# Patient Record
Sex: Male | Born: 1940 | Hispanic: No | Marital: Single | Smoking: Never smoker
Health system: Southern US, Community
[De-identification: ages and names within clinical notes are randomized; demographics above are authoritative.]

## PROBLEM LIST (undated history)

## (undated) DIAGNOSIS — D649 Anemia, unspecified: Secondary | ICD-10-CM

## (undated) DIAGNOSIS — F419 Anxiety disorder, unspecified: Secondary | ICD-10-CM

## (undated) DIAGNOSIS — E785 Hyperlipidemia, unspecified: Secondary | ICD-10-CM

## (undated) DIAGNOSIS — I639 Cerebral infarction, unspecified: Secondary | ICD-10-CM

## (undated) DIAGNOSIS — F0390 Unspecified dementia without behavioral disturbance: Secondary | ICD-10-CM

## (undated) DIAGNOSIS — M6281 Muscle weakness (generalized): Secondary | ICD-10-CM

## (undated) DIAGNOSIS — I10 Essential (primary) hypertension: Secondary | ICD-10-CM

## (undated) DIAGNOSIS — R1310 Dysphagia, unspecified: Secondary | ICD-10-CM

---

## 2001-06-16 ENCOUNTER — Encounter: Payer: Self-pay | Admitting: General Practice

## 2001-06-16 ENCOUNTER — Encounter: Admission: RE | Admit: 2001-06-16 | Discharge: 2001-06-16 | Payer: Self-pay | Admitting: General Practice

## 2010-12-12 ENCOUNTER — Other Ambulatory Visit (HOSPITAL_COMMUNITY): Payer: Self-pay | Admitting: Neurology

## 2010-12-12 DIAGNOSIS — M79609 Pain in unspecified limb: Secondary | ICD-10-CM

## 2010-12-12 DIAGNOSIS — I6350 Cerebral infarction due to unspecified occlusion or stenosis of unspecified cerebral artery: Secondary | ICD-10-CM

## 2010-12-18 ENCOUNTER — Inpatient Hospital Stay (HOSPITAL_COMMUNITY): Admission: RE | Admit: 2010-12-18 | Payer: Self-pay | Source: Ambulatory Visit

## 2010-12-18 ENCOUNTER — Other Ambulatory Visit (HOSPITAL_COMMUNITY): Payer: Self-pay

## 2012-02-01 ENCOUNTER — Other Ambulatory Visit: Payer: Self-pay | Admitting: Internal Medicine

## 2012-02-01 DIAGNOSIS — R200 Anesthesia of skin: Secondary | ICD-10-CM

## 2012-02-03 ENCOUNTER — Ambulatory Visit
Admission: RE | Admit: 2012-02-03 | Discharge: 2012-02-03 | Disposition: A | Discharge status: Home / Self Care | Payer: Medicaid Other | Source: Ambulatory Visit | Attending: Internal Medicine | Admitting: Internal Medicine

## 2012-02-03 DIAGNOSIS — R200 Anesthesia of skin: Secondary | ICD-10-CM

## 2015-11-28 ENCOUNTER — Emergency Department (HOSPITAL_COMMUNITY): Payer: Medicare Other

## 2015-11-28 ENCOUNTER — Encounter (HOSPITAL_COMMUNITY): Payer: Self-pay | Admitting: Emergency Medicine

## 2015-11-28 ENCOUNTER — Inpatient Hospital Stay (HOSPITAL_COMMUNITY)
Admission: EM | Admit: 2015-11-28 | Discharge: 2015-11-29 | DRG: 195 | Disposition: A | Discharge status: Home / Self Care | Payer: Medicare Other | Attending: Infectious Disease | Admitting: Infectious Disease

## 2015-11-28 DIAGNOSIS — R4182 Altered mental status, unspecified: Secondary | ICD-10-CM

## 2015-11-28 DIAGNOSIS — J101 Influenza due to other identified influenza virus with other respiratory manifestations: Secondary | ICD-10-CM | POA: Diagnosis present

## 2015-11-28 DIAGNOSIS — I10 Essential (primary) hypertension: Secondary | ICD-10-CM | POA: Insufficient documentation

## 2015-11-28 DIAGNOSIS — Z23 Encounter for immunization: Secondary | ICD-10-CM | POA: Diagnosis not present

## 2015-11-28 DIAGNOSIS — J1189 Influenza due to unidentified influenza virus with other manifestations: Secondary | ICD-10-CM

## 2015-11-28 DIAGNOSIS — Z7982 Long term (current) use of aspirin: Secondary | ICD-10-CM | POA: Diagnosis not present

## 2015-11-28 DIAGNOSIS — Z8673 Personal history of transient ischemic attack (TIA), and cerebral infarction without residual deficits: Secondary | ICD-10-CM | POA: Insufficient documentation

## 2015-11-28 DIAGNOSIS — Z603 Acculturation difficulty: Secondary | ICD-10-CM | POA: Insufficient documentation

## 2015-11-28 DIAGNOSIS — R55 Syncope and collapse: Secondary | ICD-10-CM | POA: Diagnosis not present

## 2015-11-28 DIAGNOSIS — E860 Dehydration: Secondary | ICD-10-CM | POA: Diagnosis present

## 2015-11-28 DIAGNOSIS — Z789 Other specified health status: Secondary | ICD-10-CM | POA: Diagnosis not present

## 2015-11-28 HISTORY — DX: Cerebral infarction, unspecified: I63.9

## 2015-11-28 LAB — CBC WITH DIFFERENTIAL/PLATELET
Basophils Absolute: 0 10*3/uL (ref 0.0–0.1)
Basophils Relative: 0 %
Eosinophils Absolute: 0.1 10*3/uL (ref 0.0–0.7)
Eosinophils Relative: 1 %
HCT: 39.6 % (ref 39.0–52.0)
Hemoglobin: 13.5 g/dL (ref 13.0–17.0)
Lymphocytes Relative: 12 %
Lymphs Abs: 0.9 10*3/uL (ref 0.7–4.0)
MCH: 25.1 pg — ABNORMAL LOW (ref 26.0–34.0)
MCHC: 34.1 g/dL (ref 30.0–36.0)
MCV: 73.7 fL — ABNORMAL LOW (ref 78.0–100.0)
Monocytes Absolute: 0.9 10*3/uL (ref 0.1–1.0)
Monocytes Relative: 12 %
Neutro Abs: 5.7 10*3/uL (ref 1.7–7.7)
Neutrophils Relative %: 75 %
Platelets: 177 10*3/uL (ref 150–400)
RBC: 5.37 MIL/uL (ref 4.22–5.81)
RDW: 15.5 % (ref 11.5–15.5)
WBC: 7.6 10*3/uL (ref 4.0–10.5)

## 2015-11-28 LAB — CSF CELL COUNT WITH DIFFERENTIAL
RBC Count, CSF: 1 /mm3 — ABNORMAL HIGH
RBC Count, CSF: 33 /mm3 — ABNORMAL HIGH
Tube #: 1
Tube #: 4
WBC, CSF: 1 /mm3 (ref 0–5)
WBC, CSF: 3 /mm3 (ref 0–5)

## 2015-11-28 LAB — URINE MICROSCOPIC-ADD ON
Bacteria, UA: NONE SEEN
RBC / HPF: NONE SEEN RBC/hpf (ref 0–5)
Squamous Epithelial / LPF: NONE SEEN

## 2015-11-28 LAB — COMPREHENSIVE METABOLIC PANEL
ALT: 19 U/L (ref 17–63)
AST: 25 U/L (ref 15–41)
Albumin: 3.7 g/dL (ref 3.5–5.0)
Alkaline Phosphatase: 86 U/L (ref 38–126)
Anion gap: 10 (ref 5–15)
BUN: 18 mg/dL (ref 6–20)
CO2: 23 mmol/L (ref 22–32)
Calcium: 9 mg/dL (ref 8.9–10.3)
Chloride: 105 mmol/L (ref 101–111)
Creatinine, Ser: 1.43 mg/dL — ABNORMAL HIGH (ref 0.61–1.24)
GFR calc Af Amer: 55 mL/min — ABNORMAL LOW (ref >60)
GFR calc non Af Amer: 47 mL/min — ABNORMAL LOW (ref >60)
Glucose, Bld: 124 mg/dL — ABNORMAL HIGH (ref 65–99)
Potassium: 3.4 mmol/L — ABNORMAL LOW (ref 3.5–5.1)
Sodium: 138 mmol/L (ref 135–145)
Total Bilirubin: 0.6 mg/dL (ref 0.3–1.2)
Total Protein: 7.6 g/dL (ref 6.5–8.1)

## 2015-11-28 LAB — I-STAT TROPONIN, ED: Troponin i, poc: 0.01 ng/mL (ref 0.00–0.08)

## 2015-11-28 LAB — INFLUENZA PANEL BY PCR (TYPE A & B)
H1N1 flu by pcr: NOT DETECTED
Influenza A By PCR: NEGATIVE
Influenza B By PCR: POSITIVE — AB

## 2015-11-28 LAB — URINALYSIS, ROUTINE W REFLEX MICROSCOPIC
Bilirubin Urine: NEGATIVE
Glucose, UA: NEGATIVE mg/dL
Ketones, ur: 15 mg/dL — AB
Leukocytes, UA: NEGATIVE
Nitrite: NEGATIVE
Protein, ur: 100 mg/dL — AB
Specific Gravity, Urine: 1.012 (ref 1.005–1.030)
pH: 6 (ref 5.0–8.0)

## 2015-11-28 LAB — PROTEIN, CSF: Total  Protein, CSF: 34 mg/dL (ref 15–45)

## 2015-11-28 LAB — I-STAT CG4 LACTIC ACID, ED
Lactic Acid, Venous: 2.06 mmol/L (ref 0.5–2.0)
Lactic Acid, Venous: 1.02 mmol/L (ref 0.5–2.0)

## 2015-11-28 LAB — GLUCOSE, CSF: Glucose, CSF: 82 mg/dL — ABNORMAL HIGH (ref 40–70)

## 2015-11-28 LAB — TROPONIN I: Troponin I: <0.03 ng/mL (ref <0.031)

## 2015-11-28 MED ORDER — CEFTRIAXONE SODIUM 2 G IJ SOLR
2.0000 g | Freq: Once | INTRAMUSCULAR | Status: AC
  Administered 2015-11-28: 2 g via INTRAVENOUS
  Filled 2015-11-28: qty 2

## 2015-11-28 MED ORDER — ACETAMINOPHEN 500 MG PO TABS
1000.0000 mg | ORAL_TABLET | Freq: Once | ORAL | Status: AC
  Administered 2015-11-28: 1000 mg via ORAL
  Filled 2015-11-28: qty 2

## 2015-11-28 MED ORDER — SODIUM CHLORIDE 0.9 % IV BOLUS (SEPSIS)
1000.0000 mL | Freq: Once | INTRAVENOUS | Status: AC
  Administered 2015-11-28: 1000 mL via INTRAVENOUS

## 2015-11-28 MED ORDER — OSELTAMIVIR PHOSPHATE 75 MG PO CAPS
75.0000 mg | ORAL_CAPSULE | Freq: Two times a day (BID) | ORAL | Status: DC
  Administered 2015-11-28: 75 mg via ORAL
  Filled 2015-11-28 (×2): qty 1

## 2015-11-28 MED ORDER — POTASSIUM CHLORIDE CRYS ER 20 MEQ PO TBCR
20.0000 meq | EXTENDED_RELEASE_TABLET | Freq: Once | ORAL | Status: AC
  Administered 2015-11-28: 20 meq via ORAL
  Filled 2015-11-28: qty 1

## 2015-11-28 MED ORDER — DEXAMETHASONE SODIUM PHOSPHATE 10 MG/ML IJ SOLN
10.0000 mg | Freq: Once | INTRAMUSCULAR | Status: AC
  Administered 2015-11-28: 10 mg via INTRAVENOUS
  Filled 2015-11-28: qty 1

## 2015-11-28 MED ORDER — HEPARIN SODIUM (PORCINE) 5000 UNIT/ML IJ SOLN
5000.0000 [IU] | Freq: Three times a day (TID) | INTRAMUSCULAR | Status: DC
  Administered 2015-11-28 – 2015-11-29 (×3): 5000 [IU] via SUBCUTANEOUS
  Filled 2015-11-28 (×4): qty 1

## 2015-11-28 MED ORDER — SODIUM CHLORIDE 0.9 % IV SOLN
INTRAVENOUS | Status: DC
Start: 2015-11-28 — End: 2015-11-29
  Administered 2015-11-28: 22:00:00 via INTRAVENOUS

## 2015-11-28 MED ORDER — VANCOMYCIN HCL 10 G IV SOLR
1500.0000 mg | Freq: Once | INTRAVENOUS | Status: AC
  Administered 2015-11-28: 1500 mg via INTRAVENOUS
  Filled 2015-11-28: qty 1500

## 2015-11-28 NOTE — ED Notes (Signed)
Interpreter at bedside-- Dr. Otis BraceIreck speaking with pt through interpreter.   Pt states that he felt bad yesterday with a fever, headache and vomiting. Went to work today, felt bad while trying to help a friend and passed out. States headache in entire head, has a hx of headaches.   Coworker is at bedside also-- witnessed pt pass out x 2, c/o severe headache to coworker.

## 2015-11-28 NOTE — H&P (Signed)
Date: 11/28/2015               Patient Name:  Jorge Burns MRN: [id number]  DOB: 06/09/1941 Age / Sex: 75 y.o., male   PCP: No primary care provider on file.         Medical Service: Internal Medicine Teaching Service         Attending Physici2an: Dr. Randall Hissornelius N Van Dam, MD    First Contact: Dr. Reubin MilanBilly Jabar Krysiak Pager: [phone]  Second Contact: Dr. Gara Kroneriana Truong Pager: [phone]       After Hours (After 5p/  First Contact Pager: [phone]  weekends / holidays): Second Contact Pager: [phone]   Chief Complaint: Fever, headache, syncope  History of Present Illness: Jorge Burns is a Jarai-speaking 75yo with PMH R MCA CVA 2 years ago who presents after passing out at work earlier today. Of note, although an interpreter was apparently available in the ED, no interpreters are available currently and will not be for at least another 1-2 days so the history is obtained through chart review. Over the past 2-3 days, he has experienced subjective fevers and generalized malaise as well as headaches and poor appetite. This morning, at work, he went up to urinate at work and passed out, corroborated by his co-worker who witnessed no seizure-like activity. He also had an episode of NBNB vomiting this morning. He was confused after passing out, but this has improved. He denies sick contacts, recent illness, photophobia, vision changes, chest pain, palpitations, abdominal pain, diarrhea, constipation, rashes, focal weakness/numbness, or other symptoms at this time.  Meds: Current Facility-Administered Medications  Medication Dose Route Frequency Provider Last Rate Last Dose  . heparin injection 5,000 Units  5,000 Units Subcutaneous 3 times per day Denton Brickiana M Truong, MD   5,000 Units at 11/28/15 1747   No current outpatient prescriptions on file.    Allergies: Allergies as of 11/28/2015  . (No Known Allergies)   Past Medical History  Diagnosis Date  . Stroke [facility](HCC)     "a long time ago -- 2 years ago"   History  reviewed. No pertinent past surgical history. History reviewed. No pertinent family history. Social History   Social History  . Marital Status: Single    Spouse Name: N/A  . Number of Children: N/A  . Years of Education: N/A   Occupational History  . Not on file.   Social History Main Topics  . Smoking status: Never Smoker   . Smokeless tobacco: Not on file  . Alcohol Use: No  . Drug Use: No  . Sexual Activity: Not on file   Other Topics Concern  . Not on file   Social History Narrative  . No narrative on file    Review of Systems: Pertinent items noted in HPI and remainder of comprehensive ROS otherwise negative.  Physical Exam: Blood pressure 139/75, pulse 78, temperature 100.1 F (37.8 C), temperature source Oral, resp. rate 14, SpO2 94 %.   Gen: Well-appearing, alert and oriented to person, place, and time HEENT: Oropharynx clear without erythema or exudate.  Neck: No cervical LAD, no thyromegaly or nodules, no JVD noted. Does have some neck stiffness. CV: Normal rate, regular rhythm, no murmurs, rubs, or gallops Pulmonary: Normal effort, CTA bilaterally, no crackles or wheezes Abdominal: Soft, non-tender, non-distended, without rebound, guarding, or masses Extremities: Distal pulses 2+ in upper and lower extremities bilaterally, no tenderness, erythema or edema Neuro: CN II-XII grossly intact, no focal weakness or sensory deficits noted. Kernig's and  Brudzinski's negative. Positive nuchal rigidity. Skin: No atypical appearing moles. No rashes  Lab results: Basic Metabolic Panel:  Recent Labs  91/47/82 1240  NA 138  K 3.4*  CL 105  CO2 23  GLUCOSE 124*  BUN 18  CREATININE 1.43*  CALCIUM 9.0   Liver Function Tests:  Recent Labs  11/28/15 1240  AST 25  ALT 19  ALKPHOS 86  BILITOT 0.6  PROT 7.6  ALBUMIN 3.7   CBC:  Recent Labs  11/28/15 1120  WBC 7.6  NEUTROABS 5.7  HGB 13.5  HCT 39.6  MCV 73.7*  PLT 177   Urinalysis:  Recent  Labs  11/28/15 1535  COLORURINE YELLOW  LABSPEC 1.012  PHURINE 6.0  GLUCOSEU NEGATIVE  HGBUR SMALL*  BILIRUBINUR NEGATIVE  KETONESUR 15*  PROTEINUR 100*  NITRITE NEGATIVE  LEUKOCYTESUR NEGATIVE   Imaging results:  Dg Chest 2 View  11/28/2015  CLINICAL DATA:  Cough and fever with recent syncopal event EXAM: CHEST  2 VIEW COMPARISON:  None. FINDINGS: Cardiac shadow is at the upper limits of normal in size. The lungs are well aerated bilaterally. Some linear changes are noted in the left lung likely chronic in nature and accentuated by patient rotation. On the lateral projection there is a rounded density identified just above the hemidiaphragm which is not well appreciated on the frontal exam. This may be related to a prominent vessel on end. Calcifications are noted to the left of the midline at the thoracic inlet likely related to a calcified thyroid nodule. No bony abnormality is seen. IMPRESSION: Rounded density seen only on the lateral projection which likely represents a vessel on end. Alternatively this may represent a calcified lymph node. Nonemergent CT of the chest may be helpful for further evaluation. Linear density throughout the left lung likely related to scarring. No focal confluent infiltrate is seen. Calcification to the left of the midline as described likely representing a thyroid nodule. Electronically Signed   By: Alcide Clever M.D.   On: 11/28/2015 12:03   Ct Head Wo Contrast  11/28/2015  CLINICAL DATA:  Syncope.  Altered mental status. EXAM: CT HEAD WITHOUT CONTRAST TECHNIQUE: Contiguous axial images were obtained from the base of the skull through the vertex without intravenous contrast. COMPARISON:  02/03/2012 FINDINGS: 2.4 by 2.3 by 3.0 cm region of encephalomalacia in the right posterior temporal lobe. This is sharply defined and appears likely to be chronic, correlate with history of prior stroke. Cerebellum, brainstem, cerebral peduncles, thalami, and basal ganglia  unremarkable aside from a 3 mm hypodensity in the right globus pallidus nucleus which could be a dilated perivascular space or small remote lacunar infarct. Ventricular system and basilar cisterns unremarkable. No intracranial hemorrhage, mass lesion, or acute CVA. Chronic bilateral maxillary and ethmoid sinusitis noted with mild chronic right sphenoid sinusitis. There is atherosclerotic calcification of the cavernous carotid arteries bilaterally. IMPRESSION: 1. No acute intracranial findings. 2. 3 cm region of encephalomalacia in the right posterior temporal lobe favoring old stroke. 3. Suspected tiny remote lacunar infarct in the right globus pallidus nucleus. 4. Chronic paranasal sinusitis. Electronically Signed   By: Gaylyn Rong M.D.   On: 11/28/2015 12:10   Dg Lumbar Puncture Fluoro Guide  11/28/2015  CLINICAL DATA:  Clinical suspicion for meningitis. Cough, fever, headache, and vomiting. Altered mental status. EXAM: DIAGNOSTIC LUMBAR PUNCTURE UNDER FLUOROSCOPIC GUIDANCE FLUOROSCOPY TIME:  Radiation Exposure Index (as provided by the fluoroscopic device): 26 microGy*m^2 PROCEDURE: The risks, benefits, and alternatives to fluoroscopically guided lumbar  puncture were discussed with the patient by the emergency room physician for whom I am the designated associate. Informed consent was obtained. There was a language barrier and reportedly telephone interpreter was utilized. The patient understood and elected to undergo the procedure. Standard time-out was employed. Following sterile skin prep and local anesthetic administration consisting of 1 percent lidocaine, a 22 gauge spinal needle was advanced without difficulty into the thecal sac at the at the L4-5 level. Clear CSF was returned. Opening pressure was not obtained due to the language barrier -I was not certain the patient could turn in the appropriate fashion within meal in his back. 12 cc of clear CSF was collected. The needle was subsequently  removed and the skin cleansed and bandaged. No immediate complications were observed. IMPRESSION: 1. Technically successful lumbar puncture at the L4-5 level, yielding 12 cc of clear CSF which was sent to the lab for analysis. Electronically Signed   By: Gaylyn Rong M.D.   On: 11/28/2015 17:48   Other results: EKG: ST elevation in all leads, LVH pattern.  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 Afeb, VSS here, no WBC, trops initially negative. CT head negative for acute process but does have encephalomalacia suggestive of old CVA. Influenza B positive. -Follow-up CSF studies -Tamiflu -IV NS @ 183ml/hr -F/u troponin, CBC, BMP, BCx -S/p ceftriaxone, vanc, decadron -Acetaminophen PRN for fevers  Dispo: Disposition is deferred at this time, awaiting improvement of current medical problems. Anticipated discharge in approximately 1-3 day(s).   The patient does not have a current PCP (No primary care provider on file.) and does need an [facility] hospital follow-up appointment after discharge.  The patient does have transportation limitations that hinder transportation to clinic appointments.  Signed: Darrick Huntsman, MD 11/28/2015, 6:51 PM

## 2015-11-28 NOTE — ED Notes (Signed)
Attempted to call report to 3E 

## 2015-11-28 NOTE — ED Notes (Signed)
Pt states has not been eating good past two days,

## 2015-11-28 NOTE — ED Provider Notes (Signed)
CSN: [id number]     Arrival date & time 11/28/15  1106 History   First MD Initiated Contact with Patient 11/28/15 1117     Chief Complaint  Patient presents with  . Loss of Consciousness   Patient is a 75 y.o. male presenting with syncope. No language interpreter was used.  Loss of Consciousness Episode history:  Single Most recent episode:  Today Timing:  Sporadic Progression:  Resolved Chronicity:  New Context: dehydration and urination   Witnessed: yes   Relieved by:  Certain positions Worsened by:  Nothing tried Ineffective treatments:  None tried Associated symptoms: headaches and weakness   Associated symptoms: no chest pain, no confusion, no difficulty breathing, no dizziness, no fever, no nausea, no palpitations, no seizures, no shortness of breath and no vomiting   Risk factors: no coronary artery disease and no seizures     Past Medical History  Diagnosis Date  . Stroke [facility](HCC)     "a long time ago -- 2 years ago"   History reviewed. No pertinent past surgical history. History reviewed. No pertinent family history. Social History  Substance Use Topics  . Smoking status: Never Smoker   . Smokeless tobacco: None  . Alcohol Use: No    Review of Systems  Constitutional: Negative for fever, chills, activity change and appetite change.  HENT: Negative for congestion, dental problem, ear pain, facial swelling, hearing loss, rhinorrhea, sneezing, sore throat, trouble swallowing and voice change.   Eyes: Negative for photophobia, pain, redness and visual disturbance.  Respiratory: Negative for apnea, cough, chest tightness, shortness of breath, wheezing and stridor.   Cardiovascular: Positive for syncope. Negative for chest pain, palpitations and leg swelling.  Gastrointestinal: Negative for nausea, vomiting, abdominal pain, diarrhea, constipation, blood in stool and abdominal distention.  Endocrine: Negative for polydipsia and polyuria.  Genitourinary: Negative for  frequency, hematuria, flank pain, decreased urine volume and difficulty urinating.  Musculoskeletal: Negative for back pain, joint swelling, gait problem, neck pain and neck stiffness.  Skin: Negative for rash and wound.  Allergic/Immunologic: Negative for immunocompromised state.  Neurological: Positive for syncope, weakness, light-headedness and headaches. Negative for dizziness, seizures, facial asymmetry, speech difficulty and numbness.  Hematological: Negative for adenopathy.  Psychiatric/Behavioral: Negative for suicidal ideas, behavioral problems, confusion, sleep disturbance and agitation. The patient is not nervous/anxious.   All other systems reviewed and are negative.     Allergies  Review of patient's allergies indicates no known allergies.  Home Medications   Prior to Admission medications   Not on File   BP 171/83 mmHg  Pulse 102  Temp(Src) 100.1 F (37.8 C) (Oral)  Resp 20  SpO2 100% Physical Exam  Constitutional: He is oriented to person, place, and time. He appears well-developed and well-nourished. He appears listless. No distress.  HENT:  Head: Normocephalic and atraumatic.  Right Ear: External ear normal.  Left Ear: External ear normal.  Eyes: Pupils are equal, round, and reactive to light. Right eye exhibits no discharge. Left eye exhibits no discharge.  Neck: Normal range of motion. No JVD present. No tracheal deviation present.  Cardiovascular: Normal rate, regular rhythm and normal heart sounds.  Exam reveals no friction rub.   No murmur heard. Pulmonary/Chest: Effort normal and breath sounds normal. No stridor. No respiratory distress. He has no wheezes.  Abdominal: Soft. Bowel sounds are normal. He exhibits no distension. There is no rebound and no guarding.  Musculoskeletal: Normal range of motion. He exhibits no edema or tenderness.  Lymphadenopathy:  He has no cervical adenopathy.  Neurological: He is oriented to person, place, and time. He  appears listless. No cranial nerve deficit. Coordination normal.  Skin: Skin is warm and dry. No rash noted. No pallor.  Psychiatric: He has a normal mood and affect. His behavior is normal. Judgment and thought content normal.  Nursing note and vitals reviewed.   ED Course  Procedures (including critical care time) Labs Review Labs Reviewed  CBC WITH DIFFERENTIAL/PLATELET - Abnormal; Notable for the following:    MCV 73.7 (*)    MCH 25.1 (*)    All other components within normal limits  COMPREHENSIVE METABOLIC PANEL - Abnormal; Notable for the following:    Potassium 3.4 (*)    Glucose, Bld 124 (*)    Creatinine, Ser 1.43 (*)    GFR calc non Af Amer 47 (*)    GFR calc Af Amer 55 (*)    All other components within normal limits  I-STAT CG4 LACTIC ACID, ED - Abnormal; Notable for the following:    Lactic Acid, Venous 2.06 (*)    All other components within normal limits  CSF CULTURE  GRAM STAIN  INFLUENZA PANEL BY PCR (TYPE A & B, H1N1)  URINALYSIS, ROUTINE W REFLEX MICROSCOPIC (NOT AT [facility])  CSF CELL COUNT WITH DIFFERENTIAL  CSF CELL COUNT WITH DIFFERENTIAL  GLUCOSE, CSF  PROTEIN, CSF  I-STAT TROPOININ, ED  I-STAT CG4 LACTIC ACID, ED    Imaging Review Dg Chest 2 View  11/28/2015  CLINICAL DATA:  Cough and fever with recent syncopal event EXAM: CHEST  2 VIEW COMPARISON:  None. FINDINGS: Cardiac shadow is at the upper limits of normal in size. The lungs are well aerated bilaterally. Some linear changes are noted in the left lung likely chronic in nature and accentuated by patient rotation. On the lateral projection there is a rounded density identified just above the hemidiaphragm which is not well appreciated on the frontal exam. This may be related to a prominent vessel on end. Calcifications are noted to the left of the midline at the thoracic inlet likely related to a calcified thyroid nodule. No bony abnormality is seen. IMPRESSION: Rounded density seen only on the lateral  projection which likely represents a vessel on end. Alternatively this may represent a calcified lymph node. Nonemergent CT of the chest may be helpful for further evaluation. Linear density throughout the left lung likely related to scarring. No focal confluent infiltrate is seen. Calcification to the left of the midline as described likely representing a thyroid nodule. Electronically Signed   By: Alcide Clever M.D.   On: 11/28/2015 12:03   Ct Head Wo Contrast  11/28/2015  CLINICAL DATA:  Syncope.  Altered mental status. EXAM: CT HEAD WITHOUT CONTRAST TECHNIQUE: Contiguous axial images were obtained from the base of the skull through the vertex without intravenous contrast. COMPARISON:  02/03/2012 FINDINGS: 2.4 by 2.3 by 3.0 cm region of encephalomalacia in the right posterior temporal lobe. This is sharply defined and appears likely to be chronic, correlate with history of prior stroke. Cerebellum, brainstem, cerebral peduncles, thalami, and basal ganglia unremarkable aside from a 3 mm hypodensity in the right globus pallidus nucleus which could be a dilated perivascular space or small remote lacunar infarct. Ventricular system and basilar cisterns unremarkable. No intracranial hemorrhage, mass lesion, or acute CVA. Chronic bilateral maxillary and ethmoid sinusitis noted with mild chronic right sphenoid sinusitis. There is atherosclerotic calcification of the cavernous carotid arteries bilaterally. IMPRESSION: 1. No acute intracranial  findings. 2. 3 cm region of encephalomalacia in the right posterior temporal lobe favoring old stroke. 3. Suspected tiny remote lacunar infarct in the right globus pallidus nucleus. 4. Chronic paranasal sinusitis. Electronically Signed   By: Gaylyn Rong M.D.   On: 11/28/2015 12:10   I have personally reviewed and evaluated these images and lab results as part of my medical decision-making.   EKG Interpretation None      MDM   Final diagnoses:  Altered mental  status    Patient brought via EMS for evaluation of syncopal episode at work. Patient with illness over the past couple days with headaches, fever, chills, decreased by mouth intake. He felt poor today and left the group at work to urinate when he was found to have passed out. No seizure activity was noted. Patient woke up but was slowed.  Upon arrival patient with temperature of 99.7, pulse 82. 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  No vital sign abnormalities to indicate urgent need for code sepsis initially. Patient given IV hydration, basic labs drawn. Lactate mildly elevated to 2.06. Patient with continued neck stiffness while in emergency department. Started vancomycin, Rocephin, Decadron.    LP attempted, 3 times without excess. Fluoroscopy guided LP ordered, antibiotics, Decadron given due to clinical suspicion for meningitis.    Called internal medicine teaching service for admission due to syncopal episode, altered mental status, clinical concern for meningitis. LP under fluoroscopy ordered and pending.  I discussed case with my attending, Dr. Ranae Palms.    I discussed case my attending, Dr. Ranae Palms.    Dan Humphreys, MD 11/28/15 1504  Loren Racer, MD 11/30/15 1426

## 2015-11-28 NOTE — Procedures (Signed)
CLINICAL DATA: [Clinical suspicion for meningitis.  Cough, fever, headache, and vomiting.  Altered mental status.]  EXAM:  DIAGNOSTIC LUMBAR PUNCTURE UNDER FLUOROSCOPIC GUIDANCE  FLUOROSCOPY TIME: Radiation Exposure Index (as provided by the fluoroscopic device):  [26 microGy*m^2]    PROCEDURE: The risks, benefits, and alternatives to fluoroscopically guided lumbar puncture were discussed with the patient by the emergency room physician for whom I am the designated associate.  Informed consent was obtained.  There was a language barrier and reportedly telephone interpreter was utilized.  The patient understood and elected to undergo the procedure.      Standard time-out was employed.  Following sterile skin prep and local anesthetic administration consisting of 1 percent lidocaine, a 22 gauge spinal needle was advanced without difficulty into the thecal sac at the at the [L4-5] level.  Clear CSF was returned.  Opening pressure was not obtained due to the language barrier -I was not certain the patient could turn in the appropriate fashion within meal in his back.      12 cc of clear CSF was collected.  The needle was subsequently removed and the skin cleansed and bandaged.  No immediate complications were observed.       IMPRESSION: [ Technically successful lumbar puncture at the L4-5 level, yielding 12 cc of clear CSF which was sent to the lab for analysis.   ]

## 2015-11-28 NOTE — ED Notes (Signed)
[facility] [employer], ?syncopal episode at work, remains altered, probable incontinence, CBG 147, VSS, vomited X1 pta, reports vomiting earlier today as well  18g left hand

## 2015-11-28 NOTE — ED Notes (Signed)
Lab called-- stated that blood for cmet was grossly hemolyzed-- will need recollected.

## 2015-11-28 NOTE — ED Notes (Signed)
[facility]

## 2015-11-29 ENCOUNTER — Other Ambulatory Visit: Payer: Self-pay

## 2015-11-29 ENCOUNTER — Other Ambulatory Visit (HOSPITAL_COMMUNITY): Payer: Self-pay | Admitting: Pharmacist

## 2015-11-29 DIAGNOSIS — J101 Influenza due to other identified influenza virus with other respiratory manifestations: Secondary | ICD-10-CM | POA: Diagnosis not present

## 2015-11-29 DIAGNOSIS — Z79899 Other long term (current) drug therapy: Secondary | ICD-10-CM

## 2015-11-29 DIAGNOSIS — Z789 Other specified health status: Secondary | ICD-10-CM

## 2015-11-29 DIAGNOSIS — I10 Essential (primary) hypertension: Secondary | ICD-10-CM

## 2015-11-29 DIAGNOSIS — R55 Syncope and collapse: Secondary | ICD-10-CM

## 2015-11-29 DIAGNOSIS — Z8673 Personal history of transient ischemic attack (TIA), and cerebral infarction without residual deficits: Secondary | ICD-10-CM | POA: Insufficient documentation

## 2015-11-29 LAB — BASIC METABOLIC PANEL
Anion gap: 7 (ref 5–15)
BUN: 20 mg/dL (ref 6–20)
CO2: 19 mmol/L — ABNORMAL LOW (ref 22–32)
Calcium: 8 mg/dL — ABNORMAL LOW (ref 8.9–10.3)
Chloride: 113 mmol/L — ABNORMAL HIGH (ref 101–111)
Creatinine, Ser: 1.22 mg/dL (ref 0.61–1.24)
GFR calc Af Amer: >60 mL/min (ref >60)
GFR calc non Af Amer: 57 mL/min — ABNORMAL LOW (ref >60)
Glucose, Bld: 153 mg/dL — ABNORMAL HIGH (ref 65–99)
Potassium: 4.1 mmol/L (ref 3.5–5.1)
Sodium: 139 mmol/L (ref 135–145)

## 2015-11-29 LAB — CBC
HCT: 37.5 % — ABNORMAL LOW (ref 39.0–52.0)
Hemoglobin: 12.5 g/dL — ABNORMAL LOW (ref 13.0–17.0)
MCH: 24.4 pg — ABNORMAL LOW (ref 26.0–34.0)
MCHC: 33.3 g/dL (ref 30.0–36.0)
MCV: 73.1 fL — ABNORMAL LOW (ref 78.0–100.0)
Platelets: 170 10*3/uL (ref 150–400)
RBC: 5.13 MIL/uL (ref 4.22–5.81)
RDW: 15.4 % (ref 11.5–15.5)
WBC: 6 10*3/uL (ref 4.0–10.5)

## 2015-11-29 LAB — HIV ANTIBODY (ROUTINE TESTING W REFLEX): HIV Screen 4th Generation wRfx: NONREACTIVE

## 2015-11-29 MED ORDER — AMLODIPINE BESYLATE 5 MG PO TABS
5.0000 mg | ORAL_TABLET | Freq: Every day | ORAL | Status: DC
  Administered 2015-11-29: 5 mg via ORAL
  Filled 2015-11-29: qty 1

## 2015-11-29 MED ORDER — ASPIRIN 81 MG PO CHEW
81.0000 mg | CHEWABLE_TABLET | Freq: Every day | ORAL | Status: DC
  Administered 2015-11-29: 81 mg via ORAL
  Filled 2015-11-29: qty 1

## 2015-11-29 MED ORDER — OSELTAMIVIR PHOSPHATE 30 MG PO CAPS
30.0000 mg | ORAL_CAPSULE | Freq: Two times a day (BID) | ORAL | Status: DC
  Administered 2015-11-29: 30 mg via ORAL
  Filled 2015-11-29 (×2): qty 1

## 2015-11-29 MED ORDER — ACETAMINOPHEN 325 MG PO TABS
650.0000 mg | ORAL_TABLET | Freq: Four times a day (QID) | ORAL | Status: DC | PRN
  Administered 2015-11-29: 650 mg via ORAL
  Filled 2015-11-29: qty 2

## 2015-11-29 MED ORDER — INFLUENZA VAC SPLIT QUAD 0.5 ML IM SUSY
0.5000 mL | PREFILLED_SYRINGE | Freq: Once | INTRAMUSCULAR | Status: AC
  Administered 2015-11-29: 0.5 mL via INTRAMUSCULAR

## 2015-11-29 MED ORDER — OSELTAMIVIR PHOSPHATE 30 MG PO CAPS: 30.0000 mg | ORAL_CAPSULE | Freq: Two times a day (BID) | ORAL | Status: DC

## 2015-11-29 MED ORDER — OSELTAMIVIR PHOSPHATE 6 MG/ML PO SUSR: 30.0000 mg | Freq: Two times a day (BID) | ORAL | Status: DC

## 2015-11-29 MED FILL — TAMIFLU 6 MG/ML SUSPENSION: 6 | 3 days supply | Qty: 60 | Fill #0

## 2015-11-29 NOTE — Progress Notes (Addendum)
Assistance with transitions of care per Dr. Kyung RuddKennedy  Tamiflu provided and reviewed with the patient, including name, instructions, indication, goals of therapy, potential side effects, importance of adherence, and safe use.  Patient verbalized understanding by repeating back information and was advised to contact me if further medication-related questions arise. Patient was also provided an information handout.

## 2015-11-29 NOTE — Progress Notes (Signed)
   Subjective: Jorge Burns had no acute events overnight. This morning, he feels well without and symptoms. We are still unable to get an interpreter despite our best efforts at this point, but history is corroborated through his co-worker.  Objective: Vital signs in last 24 hours: Filed Vitals:   11/29/15 0016 11/29/15 0425 11/29/15 1129 11/29/15 1202  BP: 149/78 153/82 154/99 152/88  Pulse: 76 70 78 73  Temp: 97.6 F (36.4 C) 97.3 F (36.3 C) 97.3 F (36.3 C) 97.7 F (36.5 C)  TempSrc: Oral Oral  Oral  Resp: 16 16  16   Height:      Weight:  128 lb 12.3 oz (58.41 kg)    SpO2: 100% 100% 99% 99%    Gen: Well-appearing, alert and oriented to person, place, and time CV: Normal rate, regular rhythm, no murmurs, rubs, or gallops Pulmonary: Normal effort, CTA bilaterally, no crackles or wheezes Abdominal: Soft, non-tender, non-distended, without rebound, guarding, or masses Extremities: Distal pulses 2+ in upper and lower extremities bilaterally, no tenderness, erythema or edema Neuro: CN II-XII grossly intact, no focal weakness or sensory deficits noted Skin: No atypical appearing moles. 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 Afeb, VSS here, no WBC, trops initially negative. CT head negative for acute process but does have encephalomalacia suggestive of old CVA.  Influenza B positive. CSF studies crystal clear without RBC, WBC, normal protein and elevated glucose. Orthostatic vitals are negative. -Follow-up HSV PCR -Tamiflu -IV NS @ 14750ml/hr - dc'ed this morning -F/u troponin, CBC, BMP, BCx -S/p ceftriaxone, vanc, decadron -Acetaminophen PRN for fevers  2. Hypertension  -Start amlodipine 5mg   Dispo: Disposition is deferred at this time, awaiting improvement of current medical problems.  Anticipated discharge in approximately 1-3 day(s).   The patient does not have a current PCP (No primary care provider on file.) and [facility].  The patient does not know have transportation limitations that hinder transportation to clinic appointments.   LOS: 1 day   Darrick HuntsmanWilliam R Anola Mcgough, MD 11/29/2015, 12:04 PM

## 2015-11-29 NOTE — Progress Notes (Signed)
Patient discharge to home accompanied by his two roommates via private car. Discharge instruction given. Patient demonstrate understanding. Telemetry box and two IV removed prior to discharge and site in good condition.

## 2015-11-30 LAB — HCV COMMENT:

## 2015-11-30 LAB — HEPATITIS B SURFACE ANTIGEN: Hepatitis B Surface Ag: NEGATIVE

## 2015-11-30 LAB — HEPATITIS C ANTIBODY (REFLEX): HCV Ab: <0.1 s/co ratio (ref 0.0–0.9)

## 2015-12-01 LAB — HERPES SIMPLEX VIRUS(HSV) DNA BY PCR
HSV 1 DNA: NEGATIVE
HSV 2 DNA: NEGATIVE

## 2015-12-01 LAB — URINE CULTURE: Culture: NO GROWTH

## 2015-12-01 NOTE — Discharge Summary (Signed)
Name: Jorge Burns MRN: [id number] DOB: 12/25/1940 75 y.o. PCP: No primary care provider on file.  Date of Admission: 11/28/2015 11:06 AM Date of Discharge: 12/01/2015 Attending Physician: No att. providers found  Discharge Diagnosis: 1. Influenza B infection  Principal Problem:   Influenza B Active Problems:   Syncope   History of CVA (cerebrovascular accident)   Language barrier   Benign essential HTN  Discharge Medications:   Medication List    TAKE these medications        oseltamivir 30 MG capsule  Commonly known as:  TAMIFLU  Take 1 capsule (30 mg total) by mouth 2 (two) times daily.        Disposition and follow-up:   JorgeJorge Burns was [facility] in Good condition.  At the hospital follow up visit please address:  1.  No recurrence of symptoms? Compliance with tamiflu? Check BP, establish care  2.  Labs / imaging needed at time of follow-up: None  3.  Pending labs/ test needing follow-up: None  Follow-up Appointments:     Follow-up Information    Follow up with [facility]. Call in 1 week.   Why:  Hospital follow-up   Contact information:   [address] [place] [id number] [phone]      Discharge Instructions: Discharge Instructions    Diet - low sodium heart healthy    Complete by:  As directed      Discharge instructions    Complete by:  As directed   Please take the tamiflu pills 2 times per day (one pill in the morning, one pill in the afternoon) for the next 4 days until you run out of pills. Also make a follow-up appointment with [facility] at the phone number provided.     Increase activity slowly    Complete by:  As directed            Consultations:    Procedures Performed:  Dg Chest 2 View  11/28/2015  CLINICAL DATA:  Cough and fever with recent syncopal event EXAM: CHEST  2 VIEW COMPARISON:  None. FINDINGS: Cardiac shadow is at the upper  limits of normal in size. The lungs are well aerated bilaterally. Some linear changes are noted in the left lung likely chronic in nature and accentuated by patient rotation. On the lateral projection there is a rounded density identified just above the hemidiaphragm which is not well appreciated on the frontal exam. This may be related to a prominent vessel on end. Calcifications are noted to the left of the midline at the thoracic inlet likely related to a calcified thyroid nodule. No bony abnormality is seen. IMPRESSION: Rounded density seen only on the lateral projection which likely represents a vessel on end. Alternatively this may represent a calcified lymph node. Nonemergent CT of the chest may be helpful for further evaluation. Linear density throughout the left lung likely related to scarring. No focal confluent infiltrate is seen. Calcification to the left of the midline as described likely representing a thyroid nodule. Electronically Signed   By: Alcide CleverMark  Lukens M.D.   On: 11/28/2015 12:03   Ct Head Wo Contrast  11/28/2015  CLINICAL DATA:  Syncope.  Altered mental status. EXAM: CT HEAD WITHOUT CONTRAST TECHNIQUE: Contiguous axial images were obtained from the base of the skull through the vertex without intravenous contrast. COMPARISON:  02/03/2012 FINDINGS: 2.4 by 2.3 by 3.0 cm region of encephalomalacia in the  right posterior temporal lobe. This is sharply defined and appears likely to be chronic, correlate with history of prior stroke. Cerebellum, brainstem, cerebral peduncles, thalami, and basal ganglia unremarkable aside from a 3 mm hypodensity in the right globus pallidus nucleus which could be a dilated perivascular space or small remote lacunar infarct. Ventricular system and basilar cisterns unremarkable. No intracranial hemorrhage, mass lesion, or acute CVA. Chronic bilateral maxillary and ethmoid sinusitis noted with mild chronic right sphenoid sinusitis. There is atherosclerotic calcification  of the cavernous carotid arteries bilaterally. IMPRESSION: 1. No acute intracranial findings. 2. 3 cm region of encephalomalacia in the right posterior temporal lobe favoring old stroke. 3. Suspected tiny remote lacunar infarct in the right globus pallidus nucleus. 4. Chronic paranasal sinusitis. Electronically Signed   By: Gaylyn Rong M.D.   On: 11/28/2015 12:10   Dg Lumbar Puncture Fluoro Guide  11/28/2015  CLINICAL DATA:  Clinical suspicion for meningitis. Cough, fever, headache, and vomiting. Altered mental status. EXAM: DIAGNOSTIC LUMBAR PUNCTURE UNDER FLUOROSCOPIC GUIDANCE FLUOROSCOPY TIME:  Radiation Exposure Index (as provided by the fluoroscopic device): 26 microGy*m^2 PROCEDURE: The risks, benefits, and alternatives to fluoroscopically guided lumbar puncture were discussed with the patient by the emergency room physician for whom I am the designated associate. Informed consent was obtained. There was a language barrier and reportedly telephone interpreter was utilized. The patient understood and elected to undergo the procedure. Standard time-out was employed. Following sterile skin prep and local anesthetic administration consisting of 1 percent lidocaine, a 22 gauge spinal needle was advanced without difficulty into the thecal sac at the at the L4-5 level. Clear CSF was returned. Opening pressure was not obtained due to the language barrier -I was not certain the patient could turn in the appropriate fashion within meal in his back. 12 cc of clear CSF was collected. The needle was subsequently removed and the skin cleansed and bandaged. No immediate complications were observed. IMPRESSION: 1. Technically successful lumbar puncture at the L4-5 level, yielding 12 cc of clear CSF which was sent to the lab for analysis. Electronically Signed   By: Gaylyn Rong M.D.   On: 11/28/2015 17:48   Admission HPI: Jorge Burns is a Jar[place]-speaking 75yo with PMH R MCA CVA 2 years ago who presents after  passing out at work earlier today. Of note, although an interpreter was apparently available in the ED, no interpreters are available currently and will not be for at least another 1-2 days so the history is obtained through chart review. Over the past 2-3 days, he has experienced subjective fevers and generalized malaise as well as headaches and poor appetite. This morning, at work, he went up to urinate at work and passed out, corroborated by his co-worker who witnessed no seizure-like activity. He also had an episode of NBNB vomiting this morning. He was confused after passing out, but this has improved. He denies sick contacts, recent illness, photophobia, vision changes, chest pain, palpitations, abdominal pain, diarrhea, constipation, rashes, focal weakness/numbness, or other symptoms at this time.  Hospital Course by problem list: Principal Problem:   Influenza B Active Problems:   Syncope   History of CVA (cerebrovascular accident)   Language barrier   Benign essential HTN   1. Influenza B infection - initially with nausea, vomiting, headache, possible syncopal episode. CT head was negative for acute processes but showed old encephalomalacia. Influenza B was positive. Given questionable neck stiffness and above symptoms, an LP was done which showed no RBCs or WBCs, normal protein  and elevated glucose, suggesting no infectious or inflammatory meningitis. He was hydrated and started on tamiflu, and responded very well without other measures.  2. HTN - patient had SBP highest in 170s, but decreased before discharge and were likely elevated in setting of acute illness. Not on any medications at home, but was instructed to follow-up with [facility] for BP recheck outpatient.  Discharge Vitals:   BP 152/88 mmHg  Pulse 73  Temp(Src) 97.7 F (36.5 C) (Oral)  Resp 16  Ht  (1.676 m)  Wt 128 lb 12.3 oz (58.41 kg)  BMI 20.79 kg/m2  SpO2 99%  Discharge Labs:  No results  found for this or any previous visit (from the past 24 hour(s)).  Signed: Darrick Huntsman, MD 12/01/2015, 11:37 AM

## 2015-12-02 LAB — CSF CULTURE W GRAM STAIN: Culture: NO GROWTH

## 2015-12-03 LAB — CULTURE, BLOOD (ROUTINE X 2)
Culture: NO GROWTH
Culture: NO GROWTH

## 2016-02-19 ENCOUNTER — Emergency Department (HOSPITAL_COMMUNITY)
Admission: EM | Admit: 2016-02-19 | Discharge: 2016-02-19 | Disposition: A | Discharge status: Home / Self Care | Payer: Medicare Other | Attending: Emergency Medicine | Admitting: Emergency Medicine

## 2016-02-19 ENCOUNTER — Encounter (HOSPITAL_COMMUNITY): Payer: Self-pay

## 2016-02-19 DIAGNOSIS — Z79899 Other long term (current) drug therapy: Secondary | ICD-10-CM | POA: Insufficient documentation

## 2016-02-19 DIAGNOSIS — Z8673 Personal history of transient ischemic attack (TIA), and cerebral infarction without residual deficits: Secondary | ICD-10-CM | POA: Insufficient documentation

## 2016-02-19 DIAGNOSIS — Z7689 Persons encountering health services in other specified circumstances: Secondary | ICD-10-CM

## 2016-02-19 DIAGNOSIS — Z0279 Encounter for issue of other medical certificate: Secondary | ICD-10-CM | POA: Insufficient documentation

## 2016-02-19 DIAGNOSIS — M545 Low back pain: Secondary | ICD-10-CM | POA: Insufficient documentation

## 2016-02-19 NOTE — ED Notes (Signed)
Denies any problems at this time

## 2016-02-19 NOTE — ED Notes (Signed)
Patient needs primary doctor to get physical to return to work.  Patient did follow up with [facility], but can't get an appointment until next month.   Needs resource guide to get a primary doctor quicker than [facility].

## 2016-02-19 NOTE — ED Notes (Addendum)
Patient here for work note to return to work. Was seen 3 months ago post fall.  Patients employer will not let him work until recheck for ongoing headache

## 2016-02-19 NOTE — Discharge Instructions (Signed)
You have been seen today for a return to work evaluation. You must follow-up with a primary doctor as soon as possible. Call the [facility] to set up an appointment. You don't have any deficits on exam and it appears you may return to work without strenuous activity. You will still need to be seen by a primary care provider to get a full physical. Return to ED should symptoms worsen.

## 2016-02-19 NOTE — ED Provider Notes (Signed)
CSN: [id number]     Arrival date & time 02/19/16  1130 History  By signing my name below, I, Jorge Burns, attest that this documentation has been prepared under the direction and in the presence of Lenix Benoist PA-C. Electronically Signed: Renetta ChalkBobby Burns, ED Scribe. 02/16/2016. 4:01 PM.     Chief Complaint  Patient presents with  . needs work note to return to work    The history is provided by the patient and a relative. No language interpreter was used.   HPI Comments: Jorge Burns is a 75 y.o. male who presents to the Emergency Department with a relative requesting a return to work note. He states his employer will not allow him to return to work until he is re-evaluated. Pt was seen in the ED on 11/28/15 s/p mechanical fall at work. Patient states he was advised to follow up with [facility] March [facility] but missed this appointment. Pt states he continues to have right lower back pain which is aggravated with standing and other movements. Patient denies any subsequent falls or trauma. 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 Patient denies neuro deficits, dizziness, changes in bowel or bladder function, or any other complaints. Translation provided by family member at the bedside.  Past Medical History  Diagnosis Date  . Stroke [facility](HCC)     "a long time ago -- 2 years ago"   History reviewed. No pertinent past surgical history. No family history on file. Social History  Substance Use Topics  . Smoking status: Never Smoker   . Smokeless tobacco: None  . Alcohol Use: No    Review of Systems  Constitutional: Negative for fever and chills.  Gastrointestinal: Negative for nausea and vomiting.  Genitourinary: Negative for difficulty urinating.  Musculoskeletal: Positive for back pain. Negative for gait problem and neck pain.  Neurological: Negative for dizziness, weakness, light-headedness, numbness and headaches.    Allergies  Review of patient's  allergies indicates no known allergies.  Home Medications   Prior to Admission medications   Medication Sig Start Date End Date Taking? Authorizing Provider  oseltamivir (TAMIFLU) 30 MG capsule Take 1 capsule (30 mg total) by mouth 2 (two) times daily. 11/29/15   Jorge HuntsmanWilliam R Kennedy, MD   BP 154/104 mmHg  Pulse 77  Temp(Src) 97.9 F (36.6 C) (Oral)  Ht 5\' 8"  (1.727 m)  Wt 158 lb (71.668 kg)  BMI 24.03 kg/m2  SpO2 98% Physical Exam  Constitutional: He is oriented to person, place, and time. He appears well-developed and well-nourished. No distress.  HENT:  Head: Normocephalic and atraumatic.  Eyes: Conjunctivae and EOM are normal. Pupils are equal, round, and reactive to light.  Neck: Normal range of motion. Neck supple.  Cardiovascular: Normal rate, regular rhythm and intact distal pulses.   Pulmonary/Chest: Effort normal. No respiratory distress.  Abdominal: There is no guarding.  Musculoskeletal: Normal range of motion. He exhibits tenderness. He exhibits no edema.  Tenderness to right lumbar musculature. FROM to extremities and spine. No paraspinal tenderness.  Lymphadenopathy:    He has no cervical adenopathy.  Neurological: He is alert and oriented to person, place, and time. He has normal reflexes.  No sensory deficits. Strength 5/5 in all extremities. No gait disturbance. Coordination intact. Cranial nerves III-XII grossly intact. No facial droop.   Skin: Skin is warm and dry. He is not diaphoretic.  Psychiatric: He has a normal mood and affect. His behavior is normal.  Nursing note  and vitals reviewed.   ED Course  Procedures  DIAGNOSTIC STUDIES: Oxygen Saturation is 98% on RA, normal by my interpretation.  COORDINATION OF CARE: 12:05 PM Will see a primary care doctor. Discussed treatment plan with pt at bedside and pt agreed to plan.   MDM   Final diagnoses:  Return to work exam    Jorge Burns presents requesting return to work evaluation and letter.  No neuro  or functional deficits. No red flag symptoms. No unexpected or dangerous abnormalities found on exam. Patient was advised he will still need to follow-up with PCP as soon as possible. Patient was approved for light duty work until he is seen by his PCP. Return precautions discussed. Patient voiced understanding of these instructions, agrees to the plan, and is comfortable with discharge.  Filed Vitals:   02/19/16 1139 02/19/16 1235  BP: 154/104 165/84  Pulse: 77 67  Temp: 97.9 F (36.6 C) 98.7 F (37.1 C)  TempSrc: Oral Oral  Resp:  20  Height:  (1.727 m)   Weight: 71.668 kg   SpO2: 98% 99%    I personally performed the services described in this documentation, which was scribed in my presence. The recorded information has been reviewed and is accurate.   Jorge Pancoast, PA-C 02/19/16 1846  Jorge Loveless, MD 02/20/16 [phone]

## 2018-02-11 ENCOUNTER — Other Ambulatory Visit: Payer: Self-pay

## 2018-02-11 ENCOUNTER — Emergency Department (HOSPITAL_COMMUNITY)
Admission: EM | Admit: 2018-02-11 | Discharge: 2018-02-12 | Disposition: A | Discharge status: Home / Self Care | Payer: Medicare Other | Source: Home / Self Care | Attending: Emergency Medicine | Admitting: Emergency Medicine

## 2018-02-11 ENCOUNTER — Emergency Department (HOSPITAL_COMMUNITY): Payer: Medicare Other

## 2018-02-11 ENCOUNTER — Encounter (HOSPITAL_COMMUNITY): Payer: Self-pay

## 2018-02-11 DIAGNOSIS — R519 Headache, unspecified: Secondary | ICD-10-CM

## 2018-02-11 DIAGNOSIS — G459 Transient cerebral ischemic attack, unspecified: Secondary | ICD-10-CM | POA: Diagnosis not present

## 2018-02-11 DIAGNOSIS — G9340 Encephalopathy, unspecified: Secondary | ICD-10-CM | POA: Diagnosis not present

## 2018-02-11 DIAGNOSIS — I10 Essential (primary) hypertension: Secondary | ICD-10-CM

## 2018-02-11 DIAGNOSIS — Z8673 Personal history of transient ischemic attack (TIA), and cerebral infarction without residual deficits: Secondary | ICD-10-CM

## 2018-02-11 DIAGNOSIS — I639 Cerebral infarction, unspecified: Secondary | ICD-10-CM | POA: Diagnosis not present

## 2018-02-11 DIAGNOSIS — I63522 Cerebral infarction due to unspecified occlusion or stenosis of left anterior cerebral artery: Secondary | ICD-10-CM | POA: Diagnosis not present

## 2018-02-11 DIAGNOSIS — R531 Weakness: Secondary | ICD-10-CM

## 2018-02-11 DIAGNOSIS — J984 Other disorders of lung: Secondary | ICD-10-CM | POA: Diagnosis not present

## 2018-02-11 DIAGNOSIS — G8191 Hemiplegia, unspecified affecting right dominant side: Secondary | ICD-10-CM | POA: Diagnosis not present

## 2018-02-11 DIAGNOSIS — J982 Interstitial emphysema: Secondary | ICD-10-CM | POA: Diagnosis not present

## 2018-02-11 DIAGNOSIS — R29810 Facial weakness: Secondary | ICD-10-CM | POA: Diagnosis not present

## 2018-02-11 DIAGNOSIS — G9389 Other specified disorders of brain: Secondary | ICD-10-CM | POA: Diagnosis not present

## 2018-02-11 DIAGNOSIS — R42 Dizziness and giddiness: Secondary | ICD-10-CM | POA: Diagnosis not present

## 2018-02-11 DIAGNOSIS — R51 Headache: Secondary | ICD-10-CM | POA: Insufficient documentation

## 2018-02-11 LAB — CBC
HCT: 42.2 % (ref 39.0–52.0)
Hemoglobin: 13.5 g/dL (ref 13.0–17.0)
MCH: 24.3 pg — ABNORMAL LOW (ref 26.0–34.0)
MCHC: 32 g/dL (ref 30.0–36.0)
MCV: 76 fL — ABNORMAL LOW (ref 78.0–100.0)
Platelets: 238 10*3/uL (ref 150–400)
RBC: 5.55 MIL/uL (ref 4.22–5.81)
RDW: 15.5 % (ref 11.5–15.5)
WBC: 6.1 10*3/uL (ref 4.0–10.5)

## 2018-02-11 LAB — COMPREHENSIVE METABOLIC PANEL
ALT: 17 U/L (ref 17–63)
AST: 21 U/L (ref 15–41)
Albumin: 3.7 g/dL (ref 3.5–5.0)
Alkaline Phosphatase: 74 U/L (ref 38–126)
Anion gap: 10 (ref 5–15)
BUN: 23 mg/dL — ABNORMAL HIGH (ref 6–20)
CO2: 25 mmol/L (ref 22–32)
Calcium: 9.1 mg/dL (ref 8.9–10.3)
Chloride: 103 mmol/L (ref 101–111)
Creatinine, Ser: 1.57 mg/dL — ABNORMAL HIGH (ref 0.61–1.24)
GFR calc Af Amer: 48 mL/min — ABNORMAL LOW (ref >60)
GFR calc non Af Amer: 41 mL/min — ABNORMAL LOW (ref >60)
Glucose, Bld: 113 mg/dL — ABNORMAL HIGH (ref 65–99)
Potassium: 3.8 mmol/L (ref 3.5–5.1)
Sodium: 138 mmol/L (ref 135–145)
Total Bilirubin: 0.4 mg/dL (ref 0.3–1.2)
Total Protein: 7.8 g/dL (ref 6.5–8.1)

## 2018-02-11 LAB — DIFFERENTIAL
Abs Immature Granulocytes: 0 10*3/uL (ref 0.0–0.1)
Basophils Absolute: 0 10*3/uL (ref 0.0–0.1)
Basophils Relative: 1 %
Eosinophils Absolute: 0.1 10*3/uL (ref 0.0–0.7)
Eosinophils Relative: 2 %
Immature Granulocytes: 0 %
Lymphocytes Relative: 39 %
Lymphs Abs: 2.4 10*3/uL (ref 0.7–4.0)
Monocytes Absolute: 0.9 10*3/uL (ref 0.1–1.0)
Monocytes Relative: 14 %
Neutro Abs: 2.7 10*3/uL (ref 1.7–7.7)
Neutrophils Relative %: 44 %

## 2018-02-11 LAB — I-STAT CHEM 8, ED
BUN: 25 mg/dL — ABNORMAL HIGH (ref 6–20)
Calcium, Ion: 1.18 mmol/L (ref 1.15–1.40)
Chloride: 102 mmol/L (ref 101–111)
Creatinine, Ser: 1.5 mg/dL — ABNORMAL HIGH (ref 0.61–1.24)
Glucose, Bld: 113 mg/dL — ABNORMAL HIGH (ref 65–99)
HCT: 43 % (ref 39.0–52.0)
Hemoglobin: 14.6 g/dL (ref 13.0–17.0)
Potassium: 3.8 mmol/L (ref 3.5–5.1)
Sodium: 140 mmol/L (ref 135–145)
TCO2: 28 mmol/L (ref 22–32)

## 2018-02-11 LAB — PROTIME-INR
INR: 1.02
Prothrombin Time: 13.3 seconds (ref 11.4–15.2)

## 2018-02-11 LAB — APTT: aPTT: 34 seconds (ref 24–36)

## 2018-02-11 LAB — I-STAT TROPONIN, ED: Troponin i, poc: 0 ng/mL (ref 0.00–0.08)

## 2018-02-11 MED ORDER — SODIUM CHLORIDE 0.9 % IV BOLUS
500.0000 mL | Freq: Once | INTRAVENOUS | Status: AC
  Administered 2018-02-11: 500 mL via INTRAVENOUS

## 2018-02-11 MED ORDER — METOCLOPRAMIDE HCL 5 MG/ML IJ SOLN
10.0000 mg | INTRAMUSCULAR | Status: AC
  Administered 2018-02-11: 10 mg via INTRAVENOUS
  Filled 2018-02-11: qty 2

## 2018-02-11 MED ORDER — KETOROLAC TROMETHAMINE 30 MG/ML IJ SOLN
15.0000 mg | Freq: Once | INTRAMUSCULAR | Status: AC
  Administered 2018-02-11: 15 mg via INTRAVENOUS
  Filled 2018-02-11: qty 1

## 2018-02-11 NOTE — ED Triage Notes (Addendum)
Pt reports the yesterday around 1 pm he started having headache, dizziness, weakness, and blurred vision while at work yesterday. Pt came home early. Pt attempted to go to work today and his supervisor told him to go home because he noticed that the pt was weak. PT states that all symptoms are left sided.

## 2018-02-11 NOTE — ED Provider Notes (Signed)
[facility] EMERGENCY DEPARTMENT Provider Note   CSN: [id number] Arrival date & time: 02/11/18  1808    History   Chief Complaint Chief Complaint  Patient presents with  . Weakness    HPI Jorge Burns is a 77 y.o. male.  77 year old male with reported history of CVA approximately 4 years ago presents to the emergency department for complaints of a left-sided headache.  Symptoms began at 1300 yesterday and have been fairly constant.  He reports pain originating behind his left eye and to his left temple.  This radiates towards his occiput.  Symptoms associated with dizziness as well as blurred vision in the left eye.  He had some nausea as well as vomiting yesterday.  Family noting persistent anorexia.  No associated fevers, head injury, trauma.  Patient is reporting some subjective sensation changes to the left side of his face.  Triage note reports associated weakness.  The patient feels as though it is difficult to move his left arm and left leg when his pain is severe.  He does have a history of left-sided headaches and states that this feels similar.  His history of headaches is documented in triage notes from 2 years prior.  Family denies any difficulty swallowing, slurred speech, ambulation difficulty.  He has not taken any medications for his symptoms since onset yesterday.  The history is provided by the patient and a relative. A language interpreter was used (family at bedside).  Weakness     Past Medical History:  Diagnosis Date  . Stroke [facility])    "a long time ago -- 2 years ago"    Patient Active Problem List   Diagnosis Date Noted  . Influenza B 11/29/2015  . History of CVA (cerebrovascular accident)   . Language barrier   . Benign essential HTN   . Syncope 11/28/2015    History reviewed. No pertinent surgical history.      Home Medications    Prior to Admission medications   Medication Sig Start Date End Date Taking? Authorizing Provider    butalbital-acetaminophen-caffeine (FIORICET, ESGIC) [phone] MG tablet Take 1-2 tablets by mouth every 8 (eight) hours as needed for headache. 02/12/18 02/12/19  Antony Madura, PA-C  oseltamivir (TAMIFLU) 30 MG capsule Take 1 capsule (30 mg total) by mouth 2 (two) times daily. Patient not taking: Reported on 02/11/2018 11/29/15   Darrick Huntsman, MD    Family History No family history on file.  Social History Social History   Tobacco Use  . Smoking status: Never Smoker  . Smokeless tobacco: Never Used  Substance Use Topics  . Alcohol use: No  . Drug use: No     Allergies   Patient has no known allergies.   Review of Systems Review of Systems  Neurological: Positive for weakness.  Ten systems reviewed and are negative for acute change, except as noted in the HPI.    Physical Exam Updated Vital Signs BP (!) 167/89 (BP Location: Right Arm)   Pulse 65   Temp 98.6 F (37 C)   Resp 16   Ht  (1.676 m)   Wt 63.5 kg (140 lb)   SpO2 100%   BMI 22.60 kg/m   Physical Exam  Constitutional: He is oriented to person, place, and time. He appears well-developed and well-nourished. No distress.  Nontoxic appearing and in NAD  HENT:  Head: Normocephalic and atraumatic.  Mouth/Throat: Oropharynx is clear and moist.  Eyes: Pupils are equal, round, and reactive to  light. Conjunctivae and EOM are normal. No scleral icterus.  Neck: Normal range of motion.  No meningismus  Cardiovascular: Normal rate, regular rhythm and intact distal pulses.  Pulmonary/Chest: Effort normal. No stridor. No respiratory distress.  Respirations even and unlabored  Musculoskeletal: Normal range of motion.  Neurological: He is alert and oriented to person, place, and time. No cranial nerve deficit. He exhibits normal muscle tone. Coordination normal.  GCS 15. Speech is goal oriented. No cranial nerve deficits appreciated; symmetric eyebrow raise, no facial drooping, tongue midline. Patient has equal  grip strength bilaterally with 5/5 strength against resistance in all major muscle groups bilaterally. Sensation to light touch intact; reports subjective decreased sensation in the LUE ONLY. Patient moves extremities without ataxia. Patient ambulatory with steady gait. Normal heel-to-toe during ambulation.  Skin: Skin is warm and dry. No rash noted. He is not diaphoretic. No erythema. No pallor.  Psychiatric: He has a normal mood and affect. His behavior is normal.  Nursing note and vitals reviewed.    ED Treatments / Results  Labs (all labs ordered are listed, but only abnormal results are displayed) Labs Reviewed  CBC - Abnormal; Notable for the following components:      Result Value   MCV 76.0 (*)    MCH 24.3 (*)    All other components within normal limits  COMPREHENSIVE METABOLIC PANEL - Abnormal; Notable for the following components:   Glucose, Bld 113 (*)    BUN 23 (*)    Creatinine, Ser 1.57 (*)    GFR calc non Af Amer 41 (*)    GFR calc Af Amer 48 (*)    All other components within normal limits  I-STAT CHEM 8, ED - Abnormal; Notable for the following components:   BUN 25 (*)    Creatinine, Ser 1.50 (*)    Glucose, Bld 113 (*)    All other components within normal limits  PROTIME-INR  APTT  DIFFERENTIAL  I-STAT TROPONIN, ED  CBG MONITORING, ED    EKG EKG Interpretation  Date/Time:  Friday Feb 11 2018 18:17:06 EDT Ventricular Rate:  76 PR Interval:  160 QRS Duration: 88 QT Interval:  374 QTC Calculation: 420 R Axis:   78 Text Interpretation:  Normal sinus rhythm Normal ECG Confirmed by Jacalyn Lefevre [phone]) on 02/11/2018 10:47:48 PM   Radiology Ct Head Wo Contrast  Result Date: 02/11/2018 CLINICAL DATA:  Headache and dizziness with blurry vision EXAM: CT HEAD WITHOUT CONTRAST TECHNIQUE: Contiguous axial images were obtained from the base of the skull through the vertex without intravenous contrast. COMPARISON:  11/28/2015 head CT FINDINGS: Brain: No acute  territorial infarction, hemorrhage or intracranial mass. Encephalomalacia in the right temporal lobe as before. Probable tiny focus of encephalomalacia in the high right parietal lobe, probably unchanged compared with 2017 comparison head CT. Mild atrophy. Stable ventricle size with mild ex vacuo dilatation of right lateral ventricle. Vascular: No hyperdense vessels.  Carotid vascular calcification Skull: Normal. Negative for fracture or focal lesion. Sinuses/Orbits: Mucosal thickening in the ethmoid sinuses. No acute orbital abnormality. Other: None IMPRESSION: 1. No definite CT evidence for acute intracranial abnormality. 2. Atrophy and right temporal lobe encephalomalacia. Electronically Signed   By: Jasmine Pang M.D.   On: 02/11/2018 20:05    Procedures Procedures (including critical care time)  Medications Ordered in ED Medications  metoCLOPramide (REGLAN) injection 10 mg (10 mg Intravenous Given 02/11/18 2303)  sodium chloride 0.9 % bolus 500 mL (500 mLs Intravenous New Bag/Given 02/11/18 2302)  ketorolac (TORADOL) 30 MG/ML injection 15 mg (15 mg Intravenous Given 02/11/18 2303)     Initial Impression / Assessment and Plan / ED Course  I have reviewed the triage vital signs and the nursing notes.  Pertinent labs & imaging results that were available during my care of the patient were reviewed by me and considered in my medical decision making (see chart for details).     11:00 PM Patient presenting to the emergency department for complaints of a left sided temporal headache which has been constant since 1300 yesterday.  Per history with family at bedside, patient does have a history of similar left-sided headaches with associated dizziness.  He has a reassuring neurologic exam with no focal deficits.  Initial work-up in the emergency department has also been reassuring with head CT negative for acute CVA, hemorrhage, hydrocephalus, mass lesion.  Given at least 2-year history of left-sided  headaches, migraine felt more likely.  Will medically manage with low-dose Toradol and Reglan.  Patient also given IV fluids.  Will reassess.  12:08 AM  Patient reassessed.  He reports improvement to his headache following medications.  He has had no clinical decompensation.  Continued suspicion for migraine headache.  Family at bedside reports that patient has seen neurology in the past, I have recommended that he continue outpatient neurologic follow-up as well as being seen by a primary care doctor.  Return precautions discussed and provided. Patient discharged in stable condition with no unaddressed concerns.   Final Clinical Impressions(s) / ED Diagnoses   Final diagnoses:  Left temporal headache    ED Discharge Orders        Ordered    butalbital-acetaminophen-caffeine (FIORICET, ESGIC) 50-325-40 MG tablet  Every 8 hours PRN     02/12/18 0010       Antony Madura, PA-C 02/12/18 0011    Jacalyn Lefevre, MD 02/12/18 [phone]

## 2018-02-12 ENCOUNTER — Inpatient Hospital Stay (HOSPITAL_COMMUNITY)
Admission: EM | Admit: 2018-02-12 | Discharge: 2018-02-18 | DRG: 065 | Disposition: A | Discharge status: Rehabilitation Facility | Payer: Medicare Other | Attending: Internal Medicine | Admitting: Internal Medicine

## 2018-02-12 ENCOUNTER — Emergency Department (HOSPITAL_COMMUNITY): Payer: Medicare Other

## 2018-02-12 DIAGNOSIS — I519 Heart disease, unspecified: Secondary | ICD-10-CM | POA: Diagnosis present

## 2018-02-12 DIAGNOSIS — R4189 Other symptoms and signs involving cognitive functions and awareness: Secondary | ICD-10-CM | POA: Diagnosis present

## 2018-02-12 DIAGNOSIS — I6523 Occlusion and stenosis of bilateral carotid arteries: Secondary | ICD-10-CM | POA: Diagnosis present

## 2018-02-12 DIAGNOSIS — G459 Transient cerebral ischemic attack, unspecified: Secondary | ICD-10-CM

## 2018-02-12 DIAGNOSIS — G43909 Migraine, unspecified, not intractable, without status migrainosus: Secondary | ICD-10-CM | POA: Diagnosis present

## 2018-02-12 DIAGNOSIS — R29702 NIHSS score 2: Secondary | ICD-10-CM | POA: Diagnosis present

## 2018-02-12 DIAGNOSIS — I119 Hypertensive heart disease without heart failure: Secondary | ICD-10-CM | POA: Diagnosis present

## 2018-02-12 DIAGNOSIS — E876 Hypokalemia: Secondary | ICD-10-CM

## 2018-02-12 DIAGNOSIS — I639 Cerebral infarction, unspecified: Secondary | ICD-10-CM | POA: Diagnosis present

## 2018-02-12 DIAGNOSIS — I5189 Other ill-defined heart diseases: Secondary | ICD-10-CM

## 2018-02-12 DIAGNOSIS — E785 Hyperlipidemia, unspecified: Secondary | ICD-10-CM | POA: Diagnosis present

## 2018-02-12 DIAGNOSIS — E7800 Pure hypercholesterolemia, unspecified: Secondary | ICD-10-CM

## 2018-02-12 DIAGNOSIS — J982 Interstitial emphysema: Secondary | ICD-10-CM | POA: Diagnosis present

## 2018-02-12 DIAGNOSIS — I771 Stricture of artery: Secondary | ICD-10-CM

## 2018-02-12 DIAGNOSIS — R402414 Glasgow coma scale score 13-15, 24 hours or more after hospital admission: Secondary | ICD-10-CM | POA: Diagnosis present

## 2018-02-12 DIAGNOSIS — I63522 Cerebral infarction due to unspecified occlusion or stenosis of left anterior cerebral artery: Principal | ICD-10-CM | POA: Diagnosis present

## 2018-02-12 DIAGNOSIS — J984 Other disorders of lung: Secondary | ICD-10-CM | POA: Diagnosis present

## 2018-02-12 DIAGNOSIS — G9340 Encephalopathy, unspecified: Secondary | ICD-10-CM | POA: Diagnosis present

## 2018-02-12 DIAGNOSIS — R29810 Facial weakness: Secondary | ICD-10-CM | POA: Diagnosis present

## 2018-02-12 DIAGNOSIS — Z0189 Encounter for other specified special examinations: Secondary | ICD-10-CM

## 2018-02-12 DIAGNOSIS — I10 Essential (primary) hypertension: Secondary | ICD-10-CM | POA: Diagnosis not present

## 2018-02-12 DIAGNOSIS — R4701 Aphasia: Secondary | ICD-10-CM

## 2018-02-12 DIAGNOSIS — I63512 Cerebral infarction due to unspecified occlusion or stenosis of left middle cerebral artery: Secondary | ICD-10-CM | POA: Diagnosis present

## 2018-02-12 DIAGNOSIS — R4781 Slurred speech: Secondary | ICD-10-CM | POA: Diagnosis present

## 2018-02-12 DIAGNOSIS — G8191 Hemiplegia, unspecified affecting right dominant side: Secondary | ICD-10-CM

## 2018-02-12 DIAGNOSIS — R7303 Prediabetes: Secondary | ICD-10-CM

## 2018-02-12 DIAGNOSIS — R1310 Dysphagia, unspecified: Secondary | ICD-10-CM | POA: Diagnosis present

## 2018-02-12 DIAGNOSIS — Z1389 Encounter for screening for other disorder: Secondary | ICD-10-CM

## 2018-02-12 DIAGNOSIS — G9389 Other specified disorders of brain: Secondary | ICD-10-CM | POA: Diagnosis present

## 2018-02-12 DIAGNOSIS — B49 Unspecified mycosis: Secondary | ICD-10-CM

## 2018-02-12 MED ORDER — SODIUM CHLORIDE 0.9 % IV BOLUS
1000.0000 mL | Freq: Once | INTRAVENOUS | Status: AC
  Administered 2018-02-13: 1000 mL via INTRAVENOUS

## 2018-02-12 MED ORDER — BUTALBITAL-APAP-CAFFEINE 50-325-40 MG PO TABS: 1.0000 | ORAL_TABLET | Freq: Three times a day (TID) | ORAL | 0 refills | Status: DC | PRN

## 2018-02-12 NOTE — Discharge Instructions (Addendum)
You have been prescribed Fioricet to take as needed for persistent headache.  We recommend close follow-up with neurology.  You may return to the emergency department for new or concerning symptoms.

## 2018-02-12 NOTE — ED Triage Notes (Signed)
Pt BIB [facility] with daughter. Per family patient experienced left sided weakness and left eye visual changes Thursday and was evaluated at the hospital and discharged. According to family around 1130 they noticed right sided facial drop, abnormal speech with slurring. Per EMS LVO negative. Patient does not speak english

## 2018-02-12 NOTE — ED Notes (Signed)
Reviewed discharge instructions with daughter per pt request.

## 2018-02-13 ENCOUNTER — Inpatient Hospital Stay (HOSPITAL_COMMUNITY): Payer: Medicare Other

## 2018-02-13 DIAGNOSIS — J189 Pneumonia, unspecified organism: Secondary | ICD-10-CM | POA: Diagnosis not present

## 2018-02-13 DIAGNOSIS — G9389 Other specified disorders of brain: Secondary | ICD-10-CM | POA: Diagnosis present

## 2018-02-13 DIAGNOSIS — R911 Solitary pulmonary nodule: Secondary | ICD-10-CM | POA: Diagnosis not present

## 2018-02-13 DIAGNOSIS — R402414 Glasgow coma scale score 13-15, 24 hours or more after hospital admission: Secondary | ICD-10-CM | POA: Diagnosis present

## 2018-02-13 DIAGNOSIS — I671 Cerebral aneurysm, nonruptured: Secondary | ICD-10-CM | POA: Diagnosis not present

## 2018-02-13 DIAGNOSIS — I6300 Cerebral infarction due to thrombosis of unspecified precerebral artery: Secondary | ICD-10-CM | POA: Diagnosis not present

## 2018-02-13 DIAGNOSIS — E876 Hypokalemia: Secondary | ICD-10-CM | POA: Diagnosis not present

## 2018-02-13 DIAGNOSIS — I639 Cerebral infarction, unspecified: Secondary | ICD-10-CM | POA: Diagnosis present

## 2018-02-13 DIAGNOSIS — L905 Scar conditions and fibrosis of skin: Secondary | ICD-10-CM | POA: Diagnosis not present

## 2018-02-13 DIAGNOSIS — I771 Stricture of artery: Secondary | ICD-10-CM | POA: Diagnosis not present

## 2018-02-13 DIAGNOSIS — I519 Heart disease, unspecified: Secondary | ICD-10-CM | POA: Diagnosis present

## 2018-02-13 DIAGNOSIS — R1310 Dysphagia, unspecified: Secondary | ICD-10-CM | POA: Diagnosis present

## 2018-02-13 DIAGNOSIS — Z1389 Encounter for screening for other disorder: Secondary | ICD-10-CM | POA: Diagnosis not present

## 2018-02-13 DIAGNOSIS — R531 Weakness: Secondary | ICD-10-CM | POA: Diagnosis not present

## 2018-02-13 DIAGNOSIS — I361 Nonrheumatic tricuspid (valve) insufficiency: Secondary | ICD-10-CM | POA: Diagnosis not present

## 2018-02-13 DIAGNOSIS — I6389 Other cerebral infarction: Secondary | ICD-10-CM | POA: Diagnosis not present

## 2018-02-13 DIAGNOSIS — I63529 Cerebral infarction due to unspecified occlusion or stenosis of unspecified anterior cerebral artery: Secondary | ICD-10-CM | POA: Diagnosis not present

## 2018-02-13 DIAGNOSIS — I119 Hypertensive heart disease without heart failure: Secondary | ICD-10-CM | POA: Diagnosis present

## 2018-02-13 DIAGNOSIS — R4781 Slurred speech: Secondary | ICD-10-CM | POA: Diagnosis present

## 2018-02-13 DIAGNOSIS — R7303 Prediabetes: Secondary | ICD-10-CM | POA: Diagnosis not present

## 2018-02-13 DIAGNOSIS — J984 Other disorders of lung: Secondary | ICD-10-CM | POA: Diagnosis present

## 2018-02-13 DIAGNOSIS — R4189 Other symptoms and signs involving cognitive functions and awareness: Secondary | ICD-10-CM | POA: Diagnosis present

## 2018-02-13 DIAGNOSIS — I63512 Cerebral infarction due to unspecified occlusion or stenosis of left middle cerebral artery: Secondary | ICD-10-CM | POA: Diagnosis present

## 2018-02-13 DIAGNOSIS — J982 Interstitial emphysema: Secondary | ICD-10-CM | POA: Diagnosis not present

## 2018-02-13 DIAGNOSIS — I69820 Aphasia following other cerebrovascular disease: Secondary | ICD-10-CM | POA: Diagnosis not present

## 2018-02-13 DIAGNOSIS — G459 Transient cerebral ischemic attack, unspecified: Secondary | ICD-10-CM | POA: Diagnosis not present

## 2018-02-13 DIAGNOSIS — G43909 Migraine, unspecified, not intractable, without status migrainosus: Secondary | ICD-10-CM | POA: Diagnosis present

## 2018-02-13 DIAGNOSIS — E785 Hyperlipidemia, unspecified: Secondary | ICD-10-CM | POA: Diagnosis present

## 2018-02-13 DIAGNOSIS — I63522 Cerebral infarction due to unspecified occlusion or stenosis of left anterior cerebral artery: Secondary | ICD-10-CM | POA: Diagnosis not present

## 2018-02-13 DIAGNOSIS — G9340 Encephalopathy, unspecified: Secondary | ICD-10-CM | POA: Diagnosis present

## 2018-02-13 DIAGNOSIS — I63411 Cerebral infarction due to embolism of right middle cerebral artery: Secondary | ICD-10-CM | POA: Diagnosis not present

## 2018-02-13 DIAGNOSIS — R29810 Facial weakness: Secondary | ICD-10-CM | POA: Diagnosis not present

## 2018-02-13 DIAGNOSIS — I5189 Other ill-defined heart diseases: Secondary | ICD-10-CM | POA: Diagnosis not present

## 2018-02-13 DIAGNOSIS — R4182 Altered mental status, unspecified: Secondary | ICD-10-CM | POA: Diagnosis not present

## 2018-02-13 DIAGNOSIS — Z23 Encounter for immunization: Secondary | ICD-10-CM | POA: Diagnosis present

## 2018-02-13 DIAGNOSIS — B449 Aspergillosis, unspecified: Secondary | ICD-10-CM | POA: Diagnosis not present

## 2018-02-13 DIAGNOSIS — I6523 Occlusion and stenosis of bilateral carotid arteries: Secondary | ICD-10-CM | POA: Diagnosis not present

## 2018-02-13 DIAGNOSIS — E7800 Pure hypercholesterolemia, unspecified: Secondary | ICD-10-CM | POA: Diagnosis not present

## 2018-02-13 DIAGNOSIS — M795 Residual foreign body in soft tissue: Secondary | ICD-10-CM | POA: Diagnosis not present

## 2018-02-13 DIAGNOSIS — G8191 Hemiplegia, unspecified affecting right dominant side: Secondary | ICD-10-CM | POA: Diagnosis not present

## 2018-02-13 DIAGNOSIS — R29702 NIHSS score 2: Secondary | ICD-10-CM | POA: Diagnosis present

## 2018-02-13 DIAGNOSIS — I10 Essential (primary) hypertension: Secondary | ICD-10-CM | POA: Diagnosis not present

## 2018-02-13 DIAGNOSIS — Z8673 Personal history of transient ischemic attack (TIA), and cerebral infarction without residual deficits: Secondary | ICD-10-CM | POA: Diagnosis not present

## 2018-02-13 DIAGNOSIS — R4701 Aphasia: Secondary | ICD-10-CM | POA: Diagnosis not present

## 2018-02-13 LAB — TROPONIN I
Troponin I: <0.03 ng/mL (ref <0.03)
Troponin I: <0.03 ng/mL (ref <0.03)
Troponin I: <0.03 ng/mL (ref <0.03)

## 2018-02-13 LAB — CREATININE, SERUM
Creatinine, Ser: 1.07 mg/dL (ref 0.61–1.24)
GFR calc Af Amer: >60 mL/min (ref >60)
GFR calc non Af Amer: >60 mL/min (ref >60)

## 2018-02-13 LAB — I-STAT CHEM 8, ED
BUN: 20 mg/dL (ref 6–20)
Calcium, Ion: 1.14 mmol/L — ABNORMAL LOW (ref 1.15–1.40)
Chloride: 104 mmol/L (ref 101–111)
Creatinine, Ser: 1.5 mg/dL — ABNORMAL HIGH (ref 0.61–1.24)
Glucose, Bld: 109 mg/dL — ABNORMAL HIGH (ref 65–99)
HCT: 39 % (ref 39.0–52.0)
Hemoglobin: 13.3 g/dL (ref 13.0–17.0)
Potassium: 3.4 mmol/L — ABNORMAL LOW (ref 3.5–5.1)
Sodium: 142 mmol/L (ref 135–145)
TCO2: 24 mmol/L (ref 22–32)

## 2018-02-13 LAB — CBC WITH DIFFERENTIAL/PLATELET
Abs Immature Granulocytes: 0 10*3/uL (ref 0.0–0.1)
Basophils Absolute: 0 10*3/uL (ref 0.0–0.1)
Basophils Relative: 1 %
Eosinophils Absolute: 0.2 10*3/uL (ref 0.0–0.7)
Eosinophils Relative: 2 %
HCT: 39.6 % (ref 39.0–52.0)
Hemoglobin: 12.9 g/dL — ABNORMAL LOW (ref 13.0–17.0)
Immature Granulocytes: 0 %
Lymphocytes Relative: 32 %
Lymphs Abs: 2.1 10*3/uL (ref 0.7–4.0)
MCH: 24.5 pg — ABNORMAL LOW (ref 26.0–34.0)
MCHC: 32.6 g/dL (ref 30.0–36.0)
MCV: 75.3 fL — ABNORMAL LOW (ref 78.0–100.0)
Monocytes Absolute: 0.7 10*3/uL (ref 0.1–1.0)
Monocytes Relative: 11 %
Neutro Abs: 3.6 10*3/uL (ref 1.7–7.7)
Neutrophils Relative %: 54 %
Platelets: 221 10*3/uL (ref 150–400)
RBC: 5.26 MIL/uL (ref 4.22–5.81)
RDW: 15.7 % — ABNORMAL HIGH (ref 11.5–15.5)
WBC: 6.6 10*3/uL (ref 4.0–10.5)

## 2018-02-13 LAB — URINALYSIS, ROUTINE W REFLEX MICROSCOPIC
Bilirubin Urine: NEGATIVE
Glucose, UA: NEGATIVE mg/dL
Hgb urine dipstick: NEGATIVE
Ketones, ur: NEGATIVE mg/dL
Leukocytes, UA: NEGATIVE
Nitrite: NEGATIVE
Protein, ur: NEGATIVE mg/dL
Specific Gravity, Urine: 1.017 (ref 1.005–1.030)
pH: 6 (ref 5.0–8.0)

## 2018-02-13 LAB — COMPREHENSIVE METABOLIC PANEL
ALT: 15 U/L — ABNORMAL LOW (ref 17–63)
AST: 18 U/L (ref 15–41)
Albumin: 3.1 g/dL — ABNORMAL LOW (ref 3.5–5.0)
Alkaline Phosphatase: 58 U/L (ref 38–126)
Anion gap: 6 (ref 5–15)
BUN: 17 mg/dL (ref 6–20)
CO2: 25 mmol/L (ref 22–32)
Calcium: 8.3 mg/dL — ABNORMAL LOW (ref 8.9–10.3)
Chloride: 107 mmol/L (ref 101–111)
Creatinine, Ser: 1.55 mg/dL — ABNORMAL HIGH (ref 0.61–1.24)
GFR calc Af Amer: 49 mL/min — ABNORMAL LOW (ref >60)
GFR calc non Af Amer: 42 mL/min — ABNORMAL LOW (ref >60)
Glucose, Bld: 112 mg/dL — ABNORMAL HIGH (ref 65–99)
Potassium: 3.3 mmol/L — ABNORMAL LOW (ref 3.5–5.1)
Sodium: 138 mmol/L (ref 135–145)
Total Bilirubin: 0.4 mg/dL (ref 0.3–1.2)
Total Protein: 6.8 g/dL (ref 6.5–8.1)

## 2018-02-13 LAB — CBC
HCT: 41.4 % (ref 39.0–52.0)
Hemoglobin: 13.6 g/dL (ref 13.0–17.0)
MCH: 24.2 pg — ABNORMAL LOW (ref 26.0–34.0)
MCHC: 32.9 g/dL (ref 30.0–36.0)
MCV: 73.7 fL — ABNORMAL LOW (ref 78.0–100.0)
Platelets: 247 10*3/uL (ref 150–400)
RBC: 5.62 MIL/uL (ref 4.22–5.81)
RDW: 15.2 % (ref 11.5–15.5)
WBC: 6.2 10*3/uL (ref 4.0–10.5)

## 2018-02-13 LAB — LIPID PANEL
Cholesterol: 213 mg/dL — ABNORMAL HIGH (ref 0–200)
HDL: 42 mg/dL (ref >40)
LDL Cholesterol: 151 mg/dL — ABNORMAL HIGH (ref 0–99)
Total CHOL/HDL Ratio: 5.1 RATIO
Triglycerides: 98 mg/dL (ref <150)
VLDL: 20 mg/dL (ref 0–40)

## 2018-02-13 LAB — HEMOGLOBIN A1C
Hgb A1c MFr Bld: 5.7 % — ABNORMAL HIGH (ref 4.8–5.6)
Mean Plasma Glucose: 116.89 mg/dL

## 2018-02-13 MED ORDER — SENNOSIDES-DOCUSATE SODIUM 8.6-50 MG PO TABS
1.0000 | ORAL_TABLET | Freq: Every evening | ORAL | Status: DC | PRN
  Administered 2018-02-16: 1 via ORAL
  Filled 2018-02-13: qty 1

## 2018-02-13 MED ORDER — POTASSIUM CHLORIDE CRYS ER 20 MEQ PO TBCR
20.0000 meq | EXTENDED_RELEASE_TABLET | Freq: Two times a day (BID) | ORAL | Status: AC
  Administered 2018-02-13 – 2018-02-14 (×4): 20 meq via ORAL
  Filled 2018-02-13 (×4): qty 1

## 2018-02-13 MED ORDER — ASPIRIN 81 MG PO CHEW
81.0000 mg | CHEWABLE_TABLET | Freq: Every day | ORAL | Status: DC
  Administered 2018-02-14 – 2018-02-18 (×5): 81 mg via ORAL
  Filled 2018-02-13 (×5): qty 1

## 2018-02-13 MED ORDER — ACETAMINOPHEN 325 MG PO TABS: 650.0000 mg | ORAL_TABLET | ORAL | Status: DC | PRN

## 2018-02-13 MED ORDER — IOPAMIDOL (ISOVUE-370) INJECTION 76%
INTRAVENOUS | Status: AC
  Filled 2018-02-13: qty 50

## 2018-02-13 MED ORDER — GADOBENATE DIMEGLUMINE 529 MG/ML IV SOLN
12.0000 mL | Freq: Once | INTRAVENOUS | Status: AC | PRN
  Administered 2018-02-13: 12 mL via INTRAVENOUS

## 2018-02-13 MED ORDER — CLOPIDOGREL BISULFATE 75 MG PO TABS
75.0000 mg | ORAL_TABLET | Freq: Every day | ORAL | Status: DC
  Administered 2018-02-13 – 2018-02-18 (×6): 75 mg via ORAL
  Filled 2018-02-13 (×6): qty 1

## 2018-02-13 MED ORDER — STROKE: EARLY STAGES OF RECOVERY BOOK
Freq: Once | Status: AC
  Administered 2018-02-14: 01:00:00
  Filled 2018-02-13: qty 1

## 2018-02-13 MED ORDER — SODIUM CHLORIDE 0.9 % IV BOLUS
500.0000 mL | Freq: Once | INTRAVENOUS | Status: AC
  Administered 2018-02-13: 500 mL via INTRAVENOUS

## 2018-02-13 MED ORDER — ASPIRIN 325 MG PO TABS
325.0000 mg | ORAL_TABLET | Freq: Every day | ORAL | Status: DC
  Filled 2018-02-13: qty 1

## 2018-02-13 MED ORDER — ASPIRIN 325 MG PO TABS
325.0000 mg | ORAL_TABLET | Freq: Every day | ORAL | Status: DC
  Administered 2018-02-13: 325 mg via ORAL

## 2018-02-13 MED ORDER — ACETAMINOPHEN 160 MG/5ML PO SOLN: 650.0000 mg | ORAL | Status: DC | PRN

## 2018-02-13 MED ORDER — SODIUM CHLORIDE 0.9 % IV SOLN
INTRAVENOUS | Status: DC
  Administered 2018-02-13 – 2018-02-14 (×6): via INTRAVENOUS
  Administered 2018-02-15: 1000 mL via INTRAVENOUS
  Administered 2018-02-15 – 2018-02-18 (×5): via INTRAVENOUS

## 2018-02-13 MED ORDER — ENOXAPARIN SODIUM 40 MG/0.4ML ~~LOC~~ SOLN
40.0000 mg | Freq: Every day | SUBCUTANEOUS | Status: DC
  Administered 2018-02-13 – 2018-02-14 (×2): 40 mg via SUBCUTANEOUS
  Filled 2018-02-13 (×3): qty 0.4

## 2018-02-13 MED ORDER — ASPIRIN 300 MG RE SUPP: 300.0000 mg | Freq: Every day | RECTAL | Status: DC

## 2018-02-13 MED ORDER — ACETAMINOPHEN 650 MG RE SUPP: 650.0000 mg | RECTAL | Status: DC | PRN

## 2018-02-13 MED ORDER — ATORVASTATIN CALCIUM 40 MG PO TABS
40.0000 mg | ORAL_TABLET | Freq: Every day | ORAL | Status: DC
  Administered 2018-02-13: 40 mg via ORAL
  Filled 2018-02-13 (×2): qty 1

## 2018-02-13 MED ORDER — IOPAMIDOL (ISOVUE-370) INJECTION 76%
50.0000 mL | Freq: Once | INTRAVENOUS | Status: AC
  Administered 2018-02-13: 50 mL via INTRAVENOUS

## 2018-02-13 NOTE — Consult Note (Signed)
Reason for Consult: ?stroke Referring Physician: ER Antonietta Breach PA  [facility] Jorge Burns is an 77 y.o. male.  HPI: He does not speak [place].  A family member is translating.  He came to the ER yesterday with facial weakness and ?unilateral weakness the side of which is inconsistent from the translator.  He also had a headache on the left.  Family member said that he was talking and oriented yesterday, but is confused and less talkative in his own native language today.  He has a history of prior stroke in 2017, which I can see as a large area of encephalomalacia in the right parietal area.  CT shows a left caudate hypodensity which was more subtle on yesterday's CT and definitely not present on another CT 2 years ago.  No hemorrhage.  He has a history of hypertension but was not on any treatment and not seen a doctor in a long time.  He has not been on any antiplatelet treatment since the stroke.  He is a non-smoker, non-diabetic.  Cholesterol status unknown.    Past Medical History:  Diagnosis Date  . Stroke [facility])    "a long time ago -- 2 years ago"    No past surgical history on file.  No family history on file.  Social History:  reports that he has never smoked. He has never used smokeless tobacco. He reports that he does not drink alcohol or use drugs.  Allergies: No Known Allergies  Prior to Admission medications   Medication Sig Start Date End Date Taking? Authorizing Provider  butalbital-acetaminophen-caffeine (FIORICET, ESGIC) [phone] MG tablet Take 1-2 tablets by mouth every 8 (eight) hours as needed for headache. 02/12/18 02/12/19  Antonietta Breach, PA-C  oseltamivir (TAMIFLU) 30 MG capsule Take 1 capsule (30 mg total) by mouth 2 (two) times daily. Patient not taking: Reported on 02/11/2018 11/29/15   Norval Gable, MD    Medications: Prior to Admission:  (Not in a hospital admission)  Results for orders placed or performed during the hospital encounter of 02/11/18 (from the past 48  hour(s))  Protime-INR     Status: None   Collection Time: 02/11/18  6:37 PM  Result Value Ref Range   Prothrombin Time 13.3 11.4 - 15.2 seconds   INR 1.02     Comment: Performed at [facility], [place] [address], [place], [place] [address]  APTT     Status: None   Collection Time: 02/11/18  6:37 PM  Result Value Ref Range   aPTT 34 24 - 36 seconds    Comment: Performed at [facility] [address] [place], [place] [address]  CBC     Status: Abnormal   Collection Time: 02/11/18  6:37 PM  Result Value Ref Range   WBC 6.1 4.0 - 10.5 K/uL   RBC 5.55 4.22 - 5.81 MIL/uL   Hemoglobin 13.5 13.0 - 17.0 g/dL   HCT 42.2 39.0 - 52.0 %   MCV 76.0 (L) 78.0 - 100.0 fL   MCH 24.3 (L) 26.0 - 34.0 pg   MCHC 32.0 30.0 - 36.0 g/dL   RDW 15.5 11.5 - 15.5 %   Platelets 238 150 - 400 K/uL    Comment: Performed at [facility] [address], [place], [place] [address]  Differential     Status: None   Collection Time: 02/11/18  6:37 PM  Result Value Ref Range   Neutrophils Relative % 44 %   Neutro Abs 2.7  1.7 - 7.7 K/uL   Lymphocytes Relative 39 %   Lymphs Abs 2.4 0.7 - 4.0 K/uL   Monocytes Relative 14 %   Monocytes Absolute 0.9 0.1 - 1.0 K/uL   Eosinophils Relative 2 %   Eosinophils Absolute 0.1 0.0 - 0.7 K/uL   Basophils Relative 1 %   Basophils Absolute 0.0 0.0 - 0.1 K/uL   Immature Granulocytes 0 %   Abs Immature Granulocytes 0.0 0.0 - 0.1 K/uL    Comment: Performed at [facility] [address] [place], [place] [address]  Comprehensive metabolic panel     Status: Abnormal   Collection Time: 02/11/18  6:37 PM  Result Value Ref Range   Sodium 138 135 - 145 mmol/L   Potassium 3.8 3.5 - 5.1 mmol/L   Chloride 103 101 - 111 mmol/L   CO2 25 22 - 32 mmol/L   Glucose, Bld 113 (H) 65 - 99 mg/dL   BUN 23 (H) 6 - 20 mg/dL   Creatinine, Ser 1.57 (H) 0.61 - 1.24 mg/dL   Calcium 9.1 8.9 - 10.3 mg/dL   Total Protein 7.8 6.5 - 8.1 g/dL   Albumin 3.7 3.5 - 5.0 g/dL    AST 21 15 - 41 U/L   ALT 17 17 - 63 U/L   Alkaline Phosphatase 74 38 - 126 U/L   Total Bilirubin 0.4 0.3 - 1.2 mg/dL   GFR calc non Af Amer 41 (L) >60 mL/min   GFR calc Af Amer 48 (L) >60 mL/min    Comment: (NOTE) The eGFR has been calculated using the CKD EPI equation. This calculation has not been validated in all clinical situations. eGFR's persistently <60 mL/min signify possible Chronic Kidney Disease.    Anion gap 10 5 - 15    Comment: Performed at [facility] [address] [place], [place] [address]  I-stat troponin, ED     Status: None   Collection Time: 02/11/18  6:59 PM  Result Value Ref Range   Troponin i, poc 0.00 0.00 - 0.08 ng/mL   Comment 3            Comment: Due to the release kinetics of cTnI, a negative result within the first hours of the onset of symptoms does not rule out myocardial infarction with certainty. If myocardial infarction is still suspected, repeat the test at appropriate intervals.   I-Stat Chem 8, ED     Status: Abnormal   Collection Time: 02/11/18  7:00 PM  Result Value Ref Range   Sodium 140 135 - 145 mmol/L   Potassium 3.8 3.5 - 5.1 mmol/L   Chloride 102 101 - 111 mmol/L   BUN 25 (H) 6 - 20 mg/dL   Creatinine, Ser 1.50 (H) 0.61 - 1.24 mg/dL   Glucose, Bld 113 (H) 65 - 99 mg/dL   Calcium, Ion 1.18 1.15 - 1.40 mmol/L   TCO2 28 22 - 32 mmol/L   Hemoglobin 14.6 13.0 - 17.0 g/dL   HCT 43.0 39.0 - 52.0 %    Ct Head Wo Contrast  Result Date: 02/12/2018 CLINICAL DATA:  On Thursday, patient was evaluated for speech difficulty, left-sided weakness, and left eye visual changes. Now today patient presents with right-sided facial droop, abnormal speech, and slurring. EXAM: CT HEAD WITHOUT CONTRAST TECHNIQUE: Contiguous axial images were obtained from the base of the skull through the vertex without intravenous contrast. COMPARISON:  02/11/2018 FINDINGS: Brain: Mild diffuse cerebral atrophy. Focal area of encephalomalacia involving the  right anterior parietal lobe consistent with old infarct. Patchy low-attenuation changes in the deep white matter consistent with small vessel ischemia. Old lacunar infarct in the left caudate. No mass-effect or midline shift. No abnormal extra-axial fluid collections. Gray-white matter junctions are distinct. Basal cisterns are not effaced. No acute intracranial hemorrhage. Vascular: Intracranial arterial vascular calcifications are present. Skull: Calvarium appears intact. Sinuses/Orbits: Mucosal thickening in the paranasal sinuses. No acute air-fluid levels. Hypoaeration of the right mastoid air cells. Other: None. IMPRESSION: No acute intracranial abnormalities. Chronic atrophy and small vessel ischemic changes. Old right parietal infarct. Electronically Signed   By: Lucienne Capers M.D.   On: 02/12/2018 23:57   Ct Head Wo Contrast  Result Date: 02/11/2018 CLINICAL DATA:  Headache and dizziness with blurry vision EXAM: CT HEAD WITHOUT CONTRAST TECHNIQUE: Contiguous axial images were obtained from the base of the skull through the vertex without intravenous contrast. COMPARISON:  11/28/2015 head CT FINDINGS: Brain: No acute territorial infarction, hemorrhage or intracranial mass. Encephalomalacia in the right temporal lobe as before. Probable tiny focus of encephalomalacia in the high right parietal lobe, probably unchanged compared with 2017 comparison head CT. Mild atrophy. Stable ventricle size with mild ex vacuo dilatation of right lateral ventricle. Vascular: No hyperdense vessels.  Carotid vascular calcification Skull: Normal. Negative for fracture or focal lesion. Sinuses/Orbits: Mucosal thickening in the ethmoid sinuses. No acute orbital abnormality. Other: None IMPRESSION: 1. No definite CT evidence for acute intracranial abnormality. 2. Atrophy and right temporal lobe encephalomalacia. Electronically Signed   By: Donavan Foil M.D.   On: 02/11/2018 20:05    ROS Blood pressure (!) 146/77, pulse  64, temperature 98.4 F (36.9 C), temperature source Oral, resp. rate 16, height 5' 6"  (1.676 m), weight 63.5 kg (140 lb), SpO2 97 %. Neurologic Examination:  Awake, alert, apathetic. Disoriented to year, month, date, day, place. Akinetic mutism.  He has a difficult time following commands.  Right lower facial weakness.  There is some asymmetry in his eyebrow levels, but foreheads have similar furrows.   Mild RUE weakness at best.  Other LUE and BLE are 5/5.   No babinski. No hoffman's.   Grimaces to pain on both sides.  Coord- cannot follow commands.     Assessment/Plan:  I don't think this is a simple Bell's palsy due to cognitive and language type of disturbance.  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  The hypodensity on CT today may represent a new event over the last 24 hours, or they may be other new strokes not visible on CT.  I recommend MRI Brain without contrast.  If positive for acute infarct, then he needs TTE, CDUS, and fasting lipid panel.  For now I think that ASA 325 mg qd would be warranted.    Given is large right parietal encephalomalacia, he is at risk of focal onset seizures.  Whether the current language and confusion disturbances are related to that or not is not clear at this time.  An EEG will help assess that.    Rogue Jury, MD 02/13/2018, 1:05 AM

## 2018-02-13 NOTE — ED Provider Notes (Signed)
[facility] EMERGENCY DEPARTMENT Provider Note   CSN: [id number] Arrival date & time: 02/12/18  2306    History   Chief Complaint Chief Complaint  Patient presents with  . Stroke Symptoms    HPI Jorge Burns is a 77 y.o. male.  77 year old male with a history of CVA approximately 4 years ago presents to the emergency department for worsening symptoms and concern for acute stroke.  He was evaluated by me yesterday for complaints of a left sided parietal headache which was associated with nausea, dizziness, generalized weakness.  He has had a history of intermittent left-sided headaches since his stroke which has been present with increased dizziness in the past.  Per patient during encounter yesterday, this led to a fall 2 years ago and patient was admitted for observation.  He had clinical improvement following a migraine cocktail and was discharged.  Granddaughter states that patient seemed increasingly weak when she assessed him upon waking at 11:30 AM.  States that patient has also had difficulty communicating and has been more resistant to speaking.  Granddaughter noticed drooping to the right side of his face this afternoon with ambulation difficulty as well.  The patient has desired to eat very little.  No has not had any trauma or falls since discharge.  Family did give Fioricet as prescribed without improvement in symptoms.      Past Medical History:  Diagnosis Date  . Stroke [facility])    "a long time ago -- 2 years ago"    Patient Active Problem List   Diagnosis Date Noted  . Influenza B 11/29/2015  . History of CVA (cerebrovascular accident)   . Language barrier   . Benign essential HTN   . Syncope 11/28/2015    No past surgical history on file.      Home Medications    Prior to Admission medications   Medication Sig Start Date End Date Taking? Authorizing Provider  butalbital-acetaminophen-caffeine (FIORICET, ESGIC) [phone] MG tablet Take 1-2  tablets by mouth every 8 (eight) hours as needed for headache. 02/12/18 02/12/19  Antony Madura, PA-C  oseltamivir (TAMIFLU) 30 MG capsule Take 1 capsule (30 mg total) by mouth 2 (two) times daily. Patient not taking: Reported on 02/11/2018 11/29/15   Darrick Huntsman, MD    Family History No family history on file.   Social History Social History   Tobacco Use  . Smoking status: Never Smoker  . Smokeless tobacco: Never Used  Substance Use Topics  . Alcohol use: No  . Drug use: No     Allergies   Patient has no known allergies.   Review of Systems Review of Systems Ten systems reviewed and are negative for acute change, except as noted in the HPI.    Physical Exam Updated Vital Signs BP (!) 146/77   Pulse 64   Temp 98.4 F (36.9 C) (Oral)   Resp 16   Ht  (1.676 m)   Wt 63.5 kg (140 lb)   SpO2 97%   BMI 22.60 kg/m   Physical Exam  Constitutional: He appears well-developed and well-nourished. No distress.  Patient calm and in NAD  HENT:  Head: Normocephalic and atraumatic.  Eyes: Conjunctivae and EOM are normal. No scleral icterus.  Neck: Normal range of motion.  No meningismus  Cardiovascular: Normal rate, regular rhythm and intact distal pulses.  Pulmonary/Chest: Effort normal. No respiratory distress.  Respirations even and unlabored  Musculoskeletal: Normal range of motion.  Neurological: He is alert.  Patient alert. Speaks very little. He is noted to have right facial droop. Patient also appears to have restricted movement of the right eyebrow with flattening of the folds to the right forehead. EOMs appear intact with ability to close both eyelids. 4+/5 strength in the RUE and RLE with 5/5 strength noted in the LUE and LLE. Achilles reflex on the right is 3+ with 2+ achilles reflex on the left. No pronator drift.  Skin: Skin is warm and dry. No rash noted. He is not diaphoretic. No erythema. No pallor.  Psychiatric: He has a normal mood and affect. His  behavior is normal.  Nursing note and vitals reviewed.    ED Treatments / Results  Labs (all labs ordered are listed, but only abnormal results are displayed) Labs Reviewed  CBC WITH DIFFERENTIAL/PLATELET - Abnormal; Notable for the following components:      Result Value   Hemoglobin 12.9 (*)    MCV 75.3 (*)    MCH 24.5 (*)    RDW 15.7 (*)    All other components within normal limits  COMPREHENSIVE METABOLIC PANEL - Abnormal; Notable for the following components:   Potassium 3.3 (*)    Glucose, Bld 112 (*)    Creatinine, Ser 1.55 (*)    Calcium 8.3 (*)    Albumin 3.1 (*)    ALT 15 (*)    GFR calc non Af Amer 42 (*)    GFR calc Af Amer 49 (*)    All other components within normal limits  I-STAT CHEM 8, ED - Abnormal; Notable for the following components:   Potassium 3.4 (*)    Creatinine, Ser 1.50 (*)    Glucose, Bld 109 (*)    Calcium, Ion 1.14 (*)    All other components within normal limits  URINALYSIS, ROUTINE W REFLEX MICROSCOPIC    EKG None  Radiology Ct Head Wo Contrast  Result Date: 02/12/2018 CLINICAL DATA:  On Thursday, patient was evaluated for speech difficulty, left-sided weakness, and left eye visual changes. Now today patient presents with right-sided facial droop, abnormal speech, and slurring. EXAM: CT HEAD WITHOUT CONTRAST TECHNIQUE: Contiguous axial images were obtained from the base of the skull through the vertex without intravenous contrast. COMPARISON:  02/11/2018 FINDINGS: Brain: Mild diffuse cerebral atrophy. Focal area of encephalomalacia involving the right anterior parietal lobe consistent with old infarct. Patchy low-attenuation changes in the deep white matter consistent with small vessel ischemia. Old lacunar infarct in the left caudate. No mass-effect or midline shift. No abnormal extra-axial fluid collections. Gray-white matter junctions are distinct. Basal cisterns are not effaced. No acute intracranial hemorrhage. Vascular: Intracranial  arterial vascular calcifications are present. Skull: Calvarium appears intact. Sinuses/Orbits: Mucosal thickening in the paranasal sinuses. No acute air-fluid levels. Hypoaeration of the right mastoid air cells. Other: None. IMPRESSION: No acute intracranial abnormalities. Chronic atrophy and small vessel ischemic changes. Old right parietal infarct. Electronically Signed   By: Burman Nieves M.D.   On: 02/12/2018 23:57   Ct Head Wo Contrast  Result Date: 02/11/2018 CLINICAL DATA:  Headache and dizziness with blurry vision EXAM: CT HEAD WITHOUT CONTRAST TECHNIQUE: Contiguous axial images were obtained from the base of the skull through the vertex without intravenous contrast. COMPARISON:  11/28/2015 head CT FINDINGS: Brain: No acute territorial infarction, hemorrhage or intracranial mass. Encephalomalacia in the right temporal lobe as before. Probable tiny focus of encephalomalacia in the high right parietal lobe, probably unchanged compared with 2017 comparison head CT. Mild atrophy. Stable ventricle size  with mild ex vacuo dilatation of right lateral ventricle. Vascular: No hyperdense vessels.  Carotid vascular calcification Skull: Normal. Negative for fracture or focal lesion. Sinuses/Orbits: Mucosal thickening in the ethmoid sinuses. No acute orbital abnormality. Other: None IMPRESSION: 1. No definite CT evidence for acute intracranial abnormality. 2. Atrophy and right temporal lobe encephalomalacia. Electronically Signed   By: Jasmine Pang M.D.   On: 02/11/2018 20:05    Procedures Procedures (including critical care time)  Medications Ordered in ED Medications  aspirin tablet 325 mg (has no administration in time range)  sodium chloride 0.9 % bolus 1,000 mL (0 mLs Intravenous Stopped 02/13/18 0101)      Initial Impression / Assessment and Plan / ED Course  I have reviewed the triage vital signs and the nursing notes.  Pertinent labs & imaging results that were available during my care of  the patient were reviewed by me and considered in my medical decision making (see chart for details).     12:15 AM Consult placed to neurology for patient evaluation.  Patient does have a new right-sided facial droop which was not present yesterday.  He is also much less conversant raising concern for aphasia.  There is flattening to the right side of the patient's forehead which could represent underlying Bell's palsy.  Strength is largely intact.  Dr. Nicholas Lose to assess patient in the ED.  1:11 AM Case discussed with Dr. Nicholas Lose of Neurology who has assessed the patient.  Neurology recommends admission for stroke work-up.  While facial features could suggest potential Bell's palsy, exam is difficult and limited secondary to language barrier.  Patient also appears acutely confused and reported aphasia inconsistent with Bell's palsy diagnosis.  We will proceed with MRI.  Consult placed for unassigned medical admission.  1:49 AM Case discussed with Dr. Emmit Pomfret of [facility] who will admit for continued stroke work up.  Vitals:   02/12/18 2312 02/12/18 2322  BP: (!) 146/77   Pulse: 64   Resp: 16   Temp: 98.4 F (36.9 C)   TempSrc: Oral   SpO2: 97%   Weight:  63.5 kg (140 lb)  Height:   (1.676 m)    Final Clinical Impressions(s) / ED Diagnoses   Final diagnoses:  Cerebrovascular accident (CVA), unspecified mechanism [facility])    ED Discharge Orders    None       Antony Madura, PA-C 02/13/18 0149    Zadie Rhine, MD 02/13/18 [phone]

## 2018-02-13 NOTE — ED Notes (Signed)
Patient transported to MRI 

## 2018-02-13 NOTE — Progress Notes (Signed)
EEG complete - results pending 

## 2018-02-13 NOTE — ED Notes (Signed)
Report attempted 

## 2018-02-13 NOTE — Progress Notes (Signed)
Patient off floor to CT.

## 2018-02-13 NOTE — ED Notes (Signed)
Pt's lunch tray arrived and was left in front of unit Patent attorney.  Mandy advised this RN that lunch tray was sitting there but pt doesn't have a table in the hallway.  This RN Risk manager know because of acuity I was unable to go find table at the moment.  Pt being transferred upstairs currently, lunch tray remains in front of Estate agent.  Will advise RN upstairs.

## 2018-02-13 NOTE — Progress Notes (Signed)
Patient appears a little bit more interactive at this time with a visitor. He is now eating his late dinner. Son is now at bedside and per family/visitor, pt still not articulating. Will continue to monitor.  Sim Boast, RN

## 2018-02-13 NOTE — Progress Notes (Addendum)
Patient seen and evaluated, chart reviewed, please see EMR for updated orders. Please see full H&P dictated by admitting physician Dr Emmit Pomfret for same date of service.    77 y.o. male with a known history of CVA in 2017, HTN presents to the emergency department for evaluation of facial and right sided weakness associated with headache and slurred speech--- admitted on 02/13/2018 with findings of acute strokes   MRI showed BOTH acute and old strokes, CTA  Neck with severe ICA stenosis on the left, d/w neurology Pa Mr dave Rinehuls......awaiting further review from neurologist Dr. Lucia Gaskins.    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 Please see full H&P dictated by admitting physician Dr Emmit Pomfret for same date of service.  Shon Hale, MD

## 2018-02-13 NOTE — Progress Notes (Addendum)
Assist from pt's son with assessment and translating, per pt's son, he is not articulating at this time except grunting.  Pt is awake , able to follow commands. No distress noted,. Pt's son left at this time. will continue to monitor.   Sim Boast, RN

## 2018-02-13 NOTE — ED Notes (Signed)
Pt ambulated to the bathroom brought the urine collection cup with him but then decided not to pee in the cup. Slight instability when walking but did not require much stabilization assistance.

## 2018-02-13 NOTE — ED Notes (Signed)
Patient in MRI 

## 2018-02-13 NOTE — ED Provider Notes (Signed)
Patient seen/examined in the Emergency Department in conjunction with Midlevel Provider [facility] Patient presents with facial droop and slurred speech. Exam : Awake alert, right facial droop. Minimal right leg drift noted Rest of exam is very limited due to language barrier Plan: Patient presents with strokelike symptoms.  The history and physical is very challenging as patient does not speak English and we must rely on family to assist. This is a repeat ER visit.  Neurology has been consulted. Last known well was over a day ago   Zadie Rhine, MD 02/13/18 [phone]

## 2018-02-13 NOTE — Progress Notes (Signed)
STROKE TEAM PROGRESS NOTE   HISTORY OF PRESENT ILLNESS (per record) Jorge Burns is an 77 y.o. male who does not speak [place].  A family member is translating.  He came to the ER yesterday with facial weakness and ?unilateral weakness the side of which is inconsistent from the translator.  He also had a headache on the left.  Family member said that he was talking and oriented yesterday, but is confused and less talkative in his own native language today.  He has a history of prior stroke in 2017, which I can see as a large area of encephalomalacia in the right parietal area.  CT shows a left caudate hypodensity which was more subtle on yesterday's CT and definitely not present on another CT 2 years ago.  No hemorrhage.  He has a history of hypertension but was not on any treatment and not seen a doctor in a long time.  He has not been on any antiplatelet treatment since the stroke.  He is a non-smoker, non-diabetic.  Cholesterol status unknown.      SUBJECTIVE (INTERVAL HISTORY) His friend is at bedside, he is following commands and answering normally    OBJECTIVE Temp:  [98.4 F (36.9 C)] 98.4 F (36.9 C) (05/25 2312) Pulse Rate:  [64-67] 67 (05/26 1337) Resp:  [16] 16 (05/26 1337) BP: (143-151)/(77-91) 143/90 (05/26 1337) SpO2:  [97 %-98 %] 98 % (05/26 1337) Weight:  [140 lb (63.5 kg)] 140 lb (63.5 kg) (05/26 1336)  CBC:  Recent Labs  Lab 02/11/18 1837  02/13/18 0058 02/13/18 0102 02/13/18 0855  WBC 6.1  --  6.6  --  6.2  NEUTROABS 2.7  --  3.6  --   --   HGB 13.5   < > 12.9* 13.3 13.6  HCT 42.2   < > 39.6 39.0 41.4  MCV 76.0*  --  75.3*  --  73.7*  PLT 238  --  221  --  247   < > = values in this interval not displayed.    Basic Metabolic Panel:  Recent Labs  Lab 02/11/18 1837  02/13/18 0058 02/13/18 0102 02/13/18 0855  NA 138   < > 138 142  --   K 3.8   < > 3.3* 3.4*  --   CL 103   < > 107 104  --   CO2 25  --  25  --   --   GLUCOSE 113*   < > 112* 109*  --   BUN  23*   < > 17 20  --   CREATININE 1.57*   < > 1.55* 1.50* 1.07  CALCIUM 9.1  --  8.3*  --   --    < > = values in this interval not displayed.    Lipid Panel:     Component Value Date/Time   CHOL 213 (H) 02/13/2018 0855   TRIG 98 02/13/2018 0855   HDL 42 02/13/2018 0855   CHOLHDL 5.1 02/13/2018 0855   VLDL 20 02/13/2018 0855   LDLCALC 151 (H) 02/13/2018 0855   HgbA1c:  Lab Results  Component Value Date   HGBA1C 5.7 (H) 02/13/2018   Urine Drug Screen: No results found for: LABOPIA, COCAINSCRNUR, LABBENZ, AMPHETMU, THCU, LABBARB  Alcohol Level No results found for: [facility]  IMAGING  Dg Chest 2 View 02/13/2018 IMPRESSION:  1. No acute cardiopulmonary abnormalities.  2.  Aortic Atherosclerosis (ICD10-I70.0).     Dg Abd 1 View 02/13/2018 IMPRESSION:  Metallic foreign bodies  are demonstrated in the soft tissues over the right hip and in the left upper quadrant.   Ct Head Wo Contrast 02/12/2018 IMPRESSION:  No acute intracranial abnormalities. Chronic atrophy and small vessel ischemic changes. Old right parietal infarct.    Ct Head Wo Contrast 02/11/2018 IMPRESSION:  1. No definite CT evidence for acute intracranial abnormality.  2. Atrophy and right temporal lobe encephalomalacia.    Mr Brain 53 Contrast Mr Angiogram Head Wo Contrast Mr Angiogram Neck W Or Wo Contrast 02/13/2018 IMPRESSION:  1. Multifocal acute ischemia within the left hemisphere, predominantly within the deep watershed zone and left ACA/MCA cortical watershed zone. No hemorrhage or mass effect.  2. Small subacute infarct within the right-sided white matter adjacent to the corpus callosum splenium.  3. No intracranial occlusion or high-grade stenosis.  4. Loss of enhancement of the proximal left internal carotid artery over a short segment with near-immediate reconstitution. This indicates either critical stenosis or occlusion.  5. 4 x 3 mm rounded focus projecting posteriorly from the proximal right  internal carotid artery, most consistent with a small aneurysm. This might be artifactual secondary to the presence of atherosclerotic plaque at the bifurcation. If clinically warranted, this could be further characterized with CTA of the neck.      Transthoracic Echocardiogram - pending 00/00/00     PHYSICAL EXAM Vitals:   02/12/18 2322 02/13/18 0620 02/13/18 1336 02/13/18 1337  BP:  (!) 151/91  (!) 143/90  Pulse:  64  67  Resp:  16  16  Temp:      TempSrc:      SpO2:  98%  98%  Weight: 140 lb (63.5 kg)  140 lb (63.5 kg)   Height:  (1.676 m)   (1.676 m)     General -  Heart - Regular rate and rhythm - no murmer appreciated Lungs - Clear to auscultation anteriorly Abdomen - Soft - non tender Extremities - Distal pulses intact - no edema Skin - Warm and dry  Mental Status: Patient is lethargic but following some commands, not oriented to year or month. Cranial Nerves: II: Discs not visualized; unable to assess visual fields,  pupils equal, round, reactive to light. III,IV, VI: ptosis not present, conjugate gaze V,VII: right lower facial weakness VIII: hearing appears normal to vpice IX,X: +cough and gag XI: bilateral shoulder shrug intact. XII: midline tongue extension Motor:right arm and leg are antigravity with mild drift, left intact  Tone and bulk:normal tone throughout; no atrophy noted Sensory: grimaces to pain x 4 Deep Tendon Reflexes: symmetric throughout      ASSESSMENT/PLAN [name] Gutridge is a 77 y.o. male with history of hypertension and a previous stroke presenting with right sided weakness and AMS. He did not receive IV t-PA due to late presentation.  Stroke: Multifocal acute ischemia within the left hemisphere due to severe left carotid stenosis  Resultant mild hemiparesis, encephalopathy  CT head - No definite CT evidence for acute intracranial abnormality. Old right parietal infarct.   MRI head - Multifocal acute ischemia within the  left hemisphere,  MRA head - unremarkable.  MRA - Neck - . Loss of enhancement of the prox L ICA. 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  Carotid Doppler - MRA / CTA neck  CTA neck critical left ICA stenosis  2D Echo - pending  LDL - 151  HgbA1c - 5.7  VTE prophylaxis - Lovenox Diet Order           Diet Heart Room  service appropriate? Yes; Fluid consistency: Thin  Diet effective now          No antithrombotic prior to admission, now on ASA and Plavix  Patient counseled to be compliant with his antithrombotic medications  Ongoing aggressive stroke risk factor management  Therapy recommendations:  pending  Disposition:  Pending  Hypertension  Stable . Permissive hypertension (OK if < 220/120) but gradually normalize in 5-7 days . Long-term BP goal normotensive  Hyperlipidemia  Lipid lowering medication PTA:  none  LDL 151, goal < 70  Current lipid lowering medication: Will start Lipitor 40 mg daily.  Continue statin at discharge   Other Stroke Risk Factors  Advanced age  Hx of stroke/TIA   Other Active Problems  Mild hypokalemia   Plan / Recommendations   Stroke workup: Criticial CTA stenosis on DUAP cerebral arteriogram Tuesday with Dr. Titus Dubin, at this time BP is > 140, need to keep BP elevated due to critical stenosis  Therapy Follow Up: pending  Disposition: pending  Antiplatelet / Anticoagulation: DUAP  Statin: Lipitor 40 mg daily  MD Follow Up: [facility] in 6-8 weeks  Other: pending  Further risk factor modification per primary care MD: Follow Up 2 weeks   Hospital day # 0  Personally  participated in, made any corrections needed, and agree with history, physical, neuro exam,assessment and plan as stated above.     Naomie Dean, MD [facility]   To contact Stroke Continuity provider, please refer to [email or web address]. After hours, contact General Neurology

## 2018-02-13 NOTE — Progress Notes (Signed)
   CTA neck findings of critical left ICA stenosis discussed with on-call neuro interventional radiologist--Dr. - T. Deveshwar   (pager [phone]) Patient symptoms and case discussed, Dr. Corliss Skains advised aspirin and Plavix for now and cerebral arteriogram on Tuesday  02/15/18 with possible intervention  States he may consider cerebral/carotid arteriogram on Monday, 02/14/2018 if patient's symptoms worsens  He also advises permissive hypertension  Shon Hale, MD

## 2018-02-13 NOTE — ED Notes (Signed)
Heart healthy lunch tray ordered 

## 2018-02-13 NOTE — H&P (Signed)
History and Physical   [facility] - [place] @ [place] Admission History and Physical [employer], D.O.    Patient Name: Jorge Burns MR#: [id number] Date of Birth: November 07, 1940 Date of Admission: 02/12/2018  Referring MD/NP/PA: PA Tresa Endo Primary Care Physician: Patient, No Pcp Per  Chief Complaint:  Chief Complaint  Patient presents with  . Stroke Symptoms  Please note the entire history is obtained from the patient's emergency department chart, emergency department provider and the patient's family who is at the bedside. Patient's personal history is limited by language barrier, family member is translating, although patient isn't talking.   HPI: Jorge Burns is a 77 y.o. male with a known history of CVA in 2017, HTN presents to the emergency department for evaluation of facial and right sided weakness associated with headache and slurred speech which began today.  Of note, he was seen in the ED for migraine symptoms yesterday which improved with medication in the ER and was discharged home.  Patient denies fevers/chills, weakness, dizziness, chest pain, shortness of breath, N/V/C/D, abdominal pain, dysuria/frequency, changes in mental status.    Otherwise there has been no change in status. Patient has been taking medication as prescribed and there has been no recent change in medication or diet.  No recent antibiotics.  There has been no recent illness, hospitalizations, travel or sick contacts.    EMS/ED Course: Patient received aspirin, NS. Medical admission has been requested for further management of right sided weakness, rule out CVA.  Review of Systems:  Unable to obtain   Past Medical History:  Diagnosis Date  . Stroke [facility])    "a long time ago -- 2 years ago"    No past surgical history on file.   reports that he has never smoked. He has never used smokeless tobacco. He reports that he does not drink alcohol or use drugs.  No Known Allergies  No family  history on file.  Prior to Admission medications   Medication Sig Start Date End Date Taking? Authorizing Provider  butalbital-acetaminophen-caffeine (FIORICET, ESGIC) [phone] MG tablet Take 1-2 tablets by mouth every 8 (eight) hours as needed for headache. 02/12/18 02/12/19 Yes Antony Madura, PA-C    Physical Exam: Vitals:   02/12/18 2312 02/12/18 2322  BP: (!) 146/77   Pulse: 64   Resp: 16   Temp: 98.4 F (36.9 C)   TempSrc: Oral   SpO2: 97%   Weight:  63.5 kg (140 lb)  Height:   (1.676 m)    GENERAL: 77 y.o.-year-old male patient, well-developed, well-nourished lying in the bed in no acute distress.  Pleasant and cooperative.  Not conversive, even in his native language.  Follows commands.  HEENT: Head atraumatic, normocephalic. Pupils equal. Mucus membranes moist. NECK: Supple. No JVD. No bruit heard.  CHEST: Normal breath sounds bilaterally. No wheezing, rales, rhonchi or crackles. No use of accessory muscles of respiration.  No reproducible chest wall tenderness.  CARDIOVASCULAR: S1, S2 normal. No murmurs, rubs, or gallops. Cap refill <2 seconds. Pulses intact distally.  ABDOMEN: Soft, nondistended, nontender. No rebound, guarding, rigidity. Normoactive bowel sounds present in all four quadrants.  EXTREMITIES: No pedal edema, cyanosis, or clubbing. No calf tenderness or Homan's sign.  NEUROLOGIC: Asymmetry of smile.  Cranial nerves II through XII are grossly intact with no focal sensorimotor deficit. SKIN: Warm, dry, and intact without obvious rash, lesion, or ulcer.    Labs on Admission:  CBC: Recent Labs  Lab 02/11/18 1837 02/11/18 1900 02/13/18  6962 02/13/18 0102  WBC 6.1  --  6.6  --   NEUTROABS 2.7  --  3.6  --   HGB 13.5 14.6 12.9* 13.3  HCT 42.2 43.0 39.6 39.0  MCV 76.0*  --  75.3*  --   PLT 238  --  221  --    Basic Metabolic Panel: Recent Labs  Lab 02/11/18 1837 02/11/18 1900 02/13/18 0058 02/13/18 0102  NA 138 140 138 142  K 3.8 3.8 3.3*  3.4*  CL 103 102 107 104  CO2 25  --  25  --   GLUCOSE 113* 113* 112* 109*  BUN 23* 25* 17 20  CREATININE 1.57* 1.50* 1.55* 1.50*  CALCIUM 9.1  --  8.3*  --    GFR: Estimated Creatinine Clearance: 38.2 mL/min (A) (by C-G formula based on SCr of 1.5 mg/dL (H)). Liver Function Tests: Recent Labs  Lab 02/11/18 1837 02/13/18 0058  AST 21 18  ALT 17 15*  ALKPHOS 74 58  BILITOT 0.4 0.4  PROT 7.8 6.8  ALBUMIN 3.7 3.1*   No results for input(s): LIPASE, AMYLASE in the last 168 hours. No results for input(s): AMMONIA in the last 168 hours. Coagulation Profile: Recent Labs  Lab 02/11/18 1837  INR 1.02   Cardiac Enzymes: No results for input(s): CKTOTAL, CKMB, CKMBINDEX, TROPONINI in the last 168 hours. BNP (last 3 results) No results for input(s): PROBNP in the last 8760 hours. HbA1C: No results for input(s): HGBA1C in the last 72 hours. CBG: No results for input(s): GLUCAP in the last 168 hours. Lipid Profile: No results for input(s): CHOL, HDL, LDLCALC, TRIG, CHOLHDL, LDLDIRECT in the last 72 hours. Thyroid Function Tests: No results for input(s): TSH, T4TOTAL, FREET4, T3FREE, THYROIDAB in the last 72 hours. Anemia Panel: No results for input(s): VITAMINB12, FOLATE, FERRITIN, TIBC, IRON, RETICCTPCT in the last 72 hours. Urine analysis:    Component Value Date/Time   COLORURINE YELLOW 11/28/2015 1535   APPEARANCEUR CLEAR 11/28/2015 1535   LABSPEC 1.012 11/28/2015 1535   PHURINE 6.0 11/28/2015 1535   GLUCOSEU NEGATIVE 11/28/2015 1535   HGBUR SMALL (A) 11/28/2015 1535   BILIRUBINUR NEGATIVE 11/28/2015 1535   KETONESUR 15 (A) 11/28/2015 1535   PROTEINUR 100 (A) 11/28/2015 1535   NITRITE NEGATIVE 11/28/2015 1535   LEUKOCYTESUR NEGATIVE 11/28/2015 1535   Sepsis Labs: (procalcitonin:4,lacticidven:4) )No results found for this or any previous visit (from the past 240 hour(s)).   Radiological Exams on Admission: Ct Head Wo Contrast  Result Date:  02/12/2018 CLINICAL DATA:  On Thursday, patient was evaluated for speech difficulty, left-sided weakness, and left eye visual changes. Now today patient presents with right-sided facial droop, abnormal speech, and slurring. EXAM: CT HEAD WITHOUT CONTRAST TECHNIQUE: Contiguous axial images were obtained from the base of the skull through the vertex without intravenous contrast. COMPARISON:  02/11/2018 FINDINGS: Brain: Mild diffuse cerebral atrophy. Focal area of encephalomalacia involving the right anterior parietal lobe consistent with old infarct. Patchy low-attenuation changes in the deep white matter consistent with small vessel ischemia. Old lacunar infarct in the left caudate. No mass-effect or midline shift. No abnormal extra-axial fluid collections. Gray-white matter junctions are distinct. Basal cisterns are not effaced. No acute intracranial hemorrhage. Vascular: Intracranial arterial vascular calcifications are present. Skull: Calvarium appears intact. Sinuses/Orbits: Mucosal thickening in the paranasal sinuses. No acute air-fluid levels. Hypoaeration of the right mastoid air cells. Other: None. IMPRESSION: No acute intracranial abnormalities. Chronic atrophy and small vessel ischemic changes. Old right parietal infarct. Electronically  Signed   By: Burman Nieves M.D.   On: 02/12/2018 23:57   Ct Head Wo Contrast  Result Date: 02/11/2018 CLINICAL DATA:  Headache and dizziness with blurry vision EXAM: CT HEAD WITHOUT CONTRAST TECHNIQUE: Contiguous axial images were obtained from the base of the skull through the vertex without intravenous contrast. COMPARISON:  11/28/2015 head CT FINDINGS: Brain: No acute territorial infarction, hemorrhage or intracranial mass. Encephalomalacia in the right temporal lobe as before. Probable tiny focus of encephalomalacia in the high right parietal lobe, probably unchanged compared with 2017 comparison head CT. Mild atrophy. Stable ventricle size with mild ex vacuo  dilatation of right lateral ventricle. Vascular: No hyperdense vessels.  Carotid vascular calcification Skull: Normal. Negative for fracture or focal lesion. Sinuses/Orbits: Mucosal thickening in the ethmoid sinuses. No acute orbital abnormality. Other: None IMPRESSION: 1. No definite CT evidence for acute intracranial abnormality. 2. Atrophy and right temporal lobe encephalomalacia. Electronically Signed   By: Jasmine Pang M.D.   On: 02/11/2018 20:05   Assessment/Plan  This is a 77 y.o. male with a history of CVA, HTN now being admitted with:  #. CVA - Admit telemetry observation for neuro workup including: - Studies: MRA/MRI, Echo, Carotids, EEG (per neuro recs) - Labs: CBC, BMP, Lipids, TFTs, A1C - Nursing: Neurochecks, O2, dysphagia screen, permissive hypertension.  - Consults: Neurology, PT/OT, S/S consults.  - Meds: Daily aspirin .   - Fluids: IVNS@75cc /hr.   - Routine DVT Px: with Lovenox, SCDs, early ambulation  Admission status: Inpatient, tele IV Fluids: NS Diet/Nutrition: NPO Consults called: Neuro  DVT Px: Lovenox, SCDs and early ambulation. Code Status: Full Code  Disposition Plan: To home in 1-2 days  All the records are reviewed and case discussed with ED provider. Management plans discussed with the patient and/or family who express understanding and agree with plan of care.  Loneta Tamplin D.O. on 02/13/2018 at 2:56 AM CC: Primary care physician; Patient, No Pcp Per   02/13/2018, 2:56 AM

## 2018-02-13 NOTE — Procedures (Signed)
  Date of recording 02/13/2018  Referring physician Dr.Eshraghi  Technical Digital EEG recording using 10-20 International electrode system.  Reason for the study Altered mental status  Description of the recording Posterior dominant rhythm is 8-9 Hz symmetrical and reactive Non REM stage II sleep seen Epileptiform features were not seen during this recording  Impression The EEG is normal in awake and sleep states

## 2018-02-13 NOTE — ED Notes (Signed)
This RN advised Sam, 3W RN that pt didn't receive his lunch tray that it sat in front of the secretary's desk for a couple hours.  Sam states they will get pt food.

## 2018-02-14 ENCOUNTER — Inpatient Hospital Stay (HOSPITAL_COMMUNITY): Payer: Medicare Other

## 2018-02-14 DIAGNOSIS — I361 Nonrheumatic tricuspid (valve) insufficiency: Secondary | ICD-10-CM

## 2018-02-14 LAB — CBC
HCT: 40.4 % (ref 39.0–52.0)
Hemoglobin: 13.2 g/dL (ref 13.0–17.0)
MCH: 24.4 pg — ABNORMAL LOW (ref 26.0–34.0)
MCHC: 32.7 g/dL (ref 30.0–36.0)
MCV: 74.5 fL — ABNORMAL LOW (ref 78.0–100.0)
Platelets: 247 10*3/uL (ref 150–400)
RBC: 5.42 MIL/uL (ref 4.22–5.81)
RDW: 15.6 % — ABNORMAL HIGH (ref 11.5–15.5)
WBC: 6.9 10*3/uL (ref 4.0–10.5)

## 2018-02-14 LAB — COMPREHENSIVE METABOLIC PANEL
ALT: 14 U/L — ABNORMAL LOW (ref 17–63)
AST: 16 U/L (ref 15–41)
Albumin: 3.2 g/dL — ABNORMAL LOW (ref 3.5–5.0)
Alkaline Phosphatase: 59 U/L (ref 38–126)
Anion gap: 6 (ref 5–15)
BUN: 9 mg/dL (ref 6–20)
CO2: 23 mmol/L (ref 22–32)
Calcium: 8.5 mg/dL — ABNORMAL LOW (ref 8.9–10.3)
Chloride: 111 mmol/L (ref 101–111)
Creatinine, Ser: 1.1 mg/dL (ref 0.61–1.24)
GFR calc Af Amer: >60 mL/min (ref >60)
GFR calc non Af Amer: >60 mL/min (ref >60)
Glucose, Bld: 94 mg/dL (ref 65–99)
Potassium: 4.1 mmol/L (ref 3.5–5.1)
Sodium: 140 mmol/L (ref 135–145)
Total Bilirubin: 0.6 mg/dL (ref 0.3–1.2)
Total Protein: 6.7 g/dL (ref 6.5–8.1)

## 2018-02-14 LAB — ECHOCARDIOGRAM COMPLETE
Height: 66 in
Weight: 2017.65 oz

## 2018-02-14 MED ORDER — ATORVASTATIN CALCIUM 80 MG PO TABS
80.0000 mg | ORAL_TABLET | Freq: Every day | ORAL | Status: DC
  Administered 2018-02-14 – 2018-02-17 (×4): 80 mg via ORAL
  Filled 2018-02-14 (×4): qty 1

## 2018-02-14 MED ORDER — GLYCOPYRROLATE 0.2 MG/ML IJ SOLN
0.2000 mg | Freq: Two times a day (BID) | INTRAMUSCULAR | Status: DC
  Administered 2018-02-14 – 2018-02-18 (×9): 0.2 mg via INTRAVENOUS
  Filled 2018-02-14 (×9): qty 1

## 2018-02-14 MED ORDER — HYDRALAZINE HCL 20 MG/ML IJ SOLN: 10.0000 mg | Freq: Four times a day (QID) | INTRAMUSCULAR | Status: DC | PRN

## 2018-02-14 NOTE — Progress Notes (Signed)
STROKE TEAM PROGRESS NOTE   HISTORY OF PRESENT ILLNESS (per record) Kolsen Bronaugh is an 77 y.o. male who does not speak [place].  A family member is translating.  He came to the ER yesterday with facial weakness and ?unilateral weakness the side of which is inconsistent from the translator.  He also had a headache on the left.  Family member said that he was talking and oriented yesterday, but is confused and less talkative in his own native language today.  He has a history of prior stroke in 2017, which I can see as a large area of encephalomalacia in the right parietal area.  CT shows a left caudate hypodensity which was more subtle on yesterday's CT and definitely not present on another CT 2 years ago.  No hemorrhage.  He has a history of hypertension but was not on any treatment and not seen a doctor in a long time.  He has not been on any antiplatelet treatment since the stroke.  He is a non-smoker, non-diabetic.  Cholesterol status unknown.      SUBJECTIVE (INTERVAL HISTORY) His nephew is at bedside, he is following commands and answering normally.He is having 2DEcho    OBJECTIVE Temp:  [97.7 F (36.5 C)-98.1 F (36.7 C)] 97.8 F (36.6 C) (05/27 0805) Pulse Rate:  [58-72] 67 (05/27 0818) Cardiac Rhythm: Normal sinus rhythm (05/27 0800) Resp:  [16-19] 17 (05/27 0805) BP: (143-220)/(70-205) 151/90 (05/27 0818) SpO2:  [97 %-100 %] 100 % (05/27 0805) Weight:  [126 lb 1.7 oz (57.2 kg)-140 lb (63.5 kg)] 126 lb 1.7 oz (57.2 kg) (05/26 1434)  CBC:  Recent Labs  Lab 02/11/18 1837  02/13/18 0058  02/13/18 0855 02/14/18 0842  WBC 6.1  --  6.6  --  6.2 6.9  NEUTROABS 2.7  --  3.6  --   --   --   HGB 13.5   < > 12.9*   < > 13.6 13.2  HCT 42.2   < > 39.6   < > 41.4 40.4  MCV 76.0*  --  75.3*  --  73.7* 74.5*  PLT 238  --  221  --  247 247   < > = values in this interval not displayed.    Basic Metabolic Panel:  Recent Labs  Lab 02/13/18 0058 02/13/18 0102 02/13/18 0855  02/14/18 0842  NA 138 142  --  140  K 3.3* 3.4*  --  4.1  CL 107 104  --  111  CO2 25  --   --  23  GLUCOSE 112* 109*  --  94  BUN 17 20  --  9  CREATININE 1.55* 1.50* 1.07 1.10  CALCIUM 8.3*  --   --  8.5*    Lipid Panel:     Component Value Date/Time   CHOL 213 (H) 02/13/2018 0855   TRIG 98 02/13/2018 0855   HDL 42 02/13/2018 0855   CHOLHDL 5.1 02/13/2018 0855   VLDL 20 02/13/2018 0855   LDLCALC 151 (H) 02/13/2018 0855   HgbA1c:  Lab Results  Component Value Date   HGBA1C 5.7 (H) 02/13/2018   Urine Drug Screen: No results found for: LABOPIA, COCAINSCRNUR, LABBENZ, AMPHETMU, THCU, LABBARB  Alcohol Level No results found for: [facility]  IMAGING  Dg Chest 2 View 02/13/2018 IMPRESSION:  1. No acute cardiopulmonary abnormalities.  2.  Aortic Atherosclerosis (ICD10-I70.0).     Dg Abd 1 View 02/13/2018 IMPRESSION:  Metallic foreign bodies are demonstrated in the soft tissues over  the right hip and in the left upper quadrant.   Ct Head Wo Contrast 02/12/2018 IMPRESSION:  No acute intracranial abnormalities. Chronic atrophy and small vessel ischemic changes. Old right parietal infarct.    Ct Head Wo Contrast 02/11/2018 IMPRESSION:  1. No definite CT evidence for acute intracranial abnormality.  2. Atrophy and right temporal lobe encephalomalacia.    Mr Brain 59 Contrast Mr Angiogram Head Wo Contrast Mr Angiogram Neck W Or Wo Contrast 02/13/2018 IMPRESSION:  1. Multifocal acute ischemia within the left hemisphere, predominantly within the deep watershed zone and left ACA/MCA cortical watershed zone. No hemorrhage or mass effect.  2. Small subacute infarct within the right-sided white matter adjacent to the corpus callosum splenium.  3. No intracranial occlusion or high-grade stenosis.  4. Loss of enhancement of the proximal left internal carotid artery over a short segment with near-immediate reconstitution. This indicates either critical stenosis or occlusion.  5. 4  x 3 mm rounded focus projecting posteriorly from the proximal right internal carotid artery, most consistent with a small aneurysm. This might be artifactual secondary to the presence of atherosclerotic plaque at the bifurcation. If clinically warranted, this could be further characterized with CTA of the neck.      Transthoracic Echocardiogram - pending 00/00/00     PHYSICAL EXAM Vitals:   02/13/18 2325 02/14/18 0313 02/14/18 0805 02/14/18 0818  BP: (!) 145/95 (!) 149/77 (!) 220/205 (!) 151/90  Pulse: (!) 58 61 72 67  Resp: Temp: 98.1 F (36.7 C) 97.7 F (36.5 C) 97.8 F (36.6 C)   TempSrc: Oral Oral Oral   SpO2: 98% 100% 100%   Weight:      Height:        General -  Heart - Regular rate and rhythm - no murmer appreciated Lungs - Clear to auscultation anteriorly Abdomen - Soft - non tender Extremities - Distal pulses intact - no edema Skin - Warm and dry  Mental Status: Patient is awake and following some commands, not oriented to year or month. Cranial Nerves: II: Discs not visualized; unable to assess visual fields,  pupils equal, round, reactive to light. III,IV, VI: ptosis not present, conjugate gaze V,VII: right lower facial weakness VIII: hearing appears normal to vpice IX,X: +cough and gag XI: bilateral shoulder shrug intact. XII: midline tongue extension Motor:right arm and leg are antigravity with mild drift, left intact  Tone and bulk:normal tone throughout; no atrophy noted Sensory: grimaces to pain x 4 Deep Tendon Reflexes: symmetric throughout      ASSESSMENT/PLAN Mr. Thoren Hosang is a 77 y.o. male with history of hypertension and a previous stroke presenting with right sided weakness and AMS. He did not receive IV t-PA due to late presentation.  Stroke: Multifocal acute ischemia within the left hemisphere due to severe left carotid stenosis  Resultant mild hemiparesis, encephalopathy  CT head - No definite CT evidence for acute  intracranial abnormality. Old right parietal infarct.   MRI head - Multifocal acute ischemia within the left hemisphere,  MRA head - unremarkable.  MRA - Neck - . Loss of enhancement of the prox L ICA. 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  Carotid Doppler - MRA / CTA neck  CTA neck critical left ICA stenosis  2D Echo - pending  LDL - 151  HgbA1c - 5.7  VTE prophylaxis - Lovenox Diet Order           DIET - DYS 1 Room service appropriate? Yes; Fluid consistency:  Nectar Thick  Diet effective now          No antithrombotic prior to admission, now on ASA and Plavix  Patient counseled to be compliant with his antithrombotic medications  Ongoing aggressive stroke risk factor management  Therapy recommendations:  pending  Disposition:  Pending  Hypertension  Stable . Permissive hypertension (OK if < 220/120) but gradually normalize in 5-7 days . Long-term BP goal normotensive  Hyperlipidemia  Lipid lowering medication PTA:  none  LDL 151, goal < 70  Current lipid lowering medication: Will start Lipitor 40 mg daily.  Continue statin at discharge   Other Stroke Risk Factors  Advanced age  Hx of stroke/TIA   Other Active Problems  Mild hypokalemia   Plan / Recommendations   Stroke workup: Criticial CTA stenosis on DUAP cerebral arteriogram Tuesday with Dr. Titus Dubin, at this time BP is > 140, need to keep BP elevated due to critical stenosis  Therapy Follow Up: pending  Disposition: pending  Antiplatelet / Anticoagulation: DUAP  Statin: Lipitor 40 mg daily  MD Follow Up: [facility] in 6-8 weeks  Other: pending  Further risk factor modification per primary care MD: Follow Up 2 weeks   Hospital day # 1  Personally  participated in, made any corrections needed, and agree with history, physical, neuro exam,assessment and plan as stated above.  Await cerebral catheter angio tomorrow. D/W nephew and patient and Dr Marisa Severin. Greater than 50%  time during this 25 minute visit was spent on counseling and coordination of care about his symptomatic carotid stenosis answering questions.   Delia Heady, MD [facility]   To contact Stroke Continuity provider, please refer to [email or web address]. After hours, contact General Neurology

## 2018-02-14 NOTE — Progress Notes (Signed)
Patient's B/P 171/97, map 118, HR 80 O2 97% RA, R 18. [facility] paged.

## 2018-02-14 NOTE — Progress Notes (Signed)
Rehab Admissions Coordinator Note:  Patient was screened by Clois Dupes for appropriateness for an Inpatient Acute Rehab Consult per OT recommendation.  At this time, we are recommending Inpatient Rehab consult. I will contact Dr. Mariea Clonts for order.  Clois Dupes 02/14/2018, 4:17 PM  I can be reached at [phone].

## 2018-02-14 NOTE — Evaluation (Signed)
Clinical/Bedside Swallow Evaluation Patient Details  Name: Jorge Burns MRN: [id number] Date of Birth: 01-17-1941  Today's Date: 02/14/2018 Time: SLP Start Time (ACUTE ONLY): 1222 SLP Stop Time (ACUTE ONLY): 1235 SLP Time Calculation (min) (ACUTE ONLY): 13 min  Past Medical History:  Past Medical History:  Diagnosis Date  . Stroke [facility])    "a long time ago -- 2 years ago"   Past Surgical History: No past surgical history on file. HPI:  Jorge Burns is a 77 y.o. male with history of hypertension and a previous stroke presenting with right sided weakness and AMS. He did not receive IV t-PA due to late presentation. MRI showed multifocal acute ischemia within the left hemisphere due to severe left carotid stenosis. Pt initially passed stroke swallow screen, referred for swallow evaluation due to MD, RN concerns; MD downgraded to D1/nectar.   Assessment / Plan / Recommendation Clinical Impression   Patient presents with signs of oropharyngeal dysphagia. Pt does not cough or vocalize upon command, concerning for possible CN X involvement (niece interpreting via phone); he is able to follow other simple basic commands. Right facial weakness results in decreased labial seal and anterior spillage with liquids, thin>nectar. There is also immediate wet coughing with sip of thin water, suggestive of decreased airway protection. He is able to self-feed with some assistance; when SLP entered room, CNA present and changing pt's gown which was covered in copious amounts of food and liquids, presumably from pt's lunch tray which he consumed without assistance. Mastication and oral transit prolonged with regular solid, though pt achieves adequate clearance with no pocketing noted. Recommend dys 2, nectar thick liquids, meds whole in puree, full supervision and assist pt with set-up, feeding as needed. Will follow for tolerance vs need for instrumental assessment.    SLP Visit Diagnosis: Dysphagia, oropharyngeal  phase (R13.12)    Aspiration Risk  Mild aspiration risk;Moderate aspiration risk    Diet Recommendation Dysphagia 2 (Fine chop);Nectar-thick liquid   Liquid Administration via: Cup Medication Administration: Whole meds with puree Supervision: Full supervision/cueing for compensatory strategies;Staff to assist with self feeding Compensations: Slow rate;Small sips/bites;Minimize environmental distractions Postural Changes: Seated upright at 90 degrees    Other  Recommendations Oral Care Recommendations: Oral care BID Other Recommendations: Order thickener from pharmacy   Follow up Recommendations Other (comment)(tbd)      Frequency and Duration min 2x/week  2 weeks       Prognosis Prognosis for Safe Diet Advancement: Good Barriers to Reach Goals: Language deficits;Cognitive deficits      Swallow Study   General Date of Onset: 02/12/18 HPI: Jorge Burns is a 77 y.o. male with history of hypertension and a previous stroke presenting with right sided weakness and AMS. He did not receive IV t-PA due to late presentation. MRI showed multifocal acute ischemia within the left hemisphere due to severe left carotid stenosis. Pt initially passed stroke swallow screen, referred for swallow evaluation due to MD, RN concerns; MD downgraded to D1/nectar. Type of Study: Bedside Swallow Evaluation Previous Swallow Assessment: none on file Diet Prior to this Study: Dysphagia 1 (puree);Nectar-thick liquids Temperature Spikes Noted: No Respiratory Status: Room air History of Recent Intubation: No Behavior/Cognition: Alert;Cooperative Oral Cavity Assessment: Within Functional Limits Oral Care Completed by SLP: No Oral Cavity - Dentition: Missing dentition Vision: Functional for self-feeding Self-Feeding Abilities: Needs assist Patient Positioning: Upright in bed Baseline Vocal Quality: Not observed(question CN X involvement) Volitional Cough: Other (Comment)(Unable to elicit) Volitional  Swallow: Unable to elicit  Oral/Motor/Sensory Function Overall Oral Motor/Sensory Function: Moderate impairment Facial ROM: Reduced right;Suspected CN VII (facial) dysfunction Facial Symmetry: Abnormal symmetry right;Suspected CN VII (facial) dysfunction Facial Strength: Reduced right;Suspected CN VII (facial) dysfunction Lingual ROM: Other (Comment)(UTA) Lingual Symmetry: (difficult to assess; pt does not protrude)   Ice Chips Ice chips: Within functional limits   Thin Liquid Thin Liquid: Impaired Oral Phase Functional Implications: Right anterior spillage Pharyngeal  Phase Impairments: Multiple swallows;Cough - Immediate    Nectar Thick Nectar Thick Liquid: Impaired Presentation: Cup;Spoon Oral Phase Impairments: Reduced labial seal Oral phase functional implications: Right anterior spillage   Honey Thick Honey Thick Liquid: Within functional limits   Puree Puree: Within functional limits Presentation: Spoon   Solid   GO    Jorge Burns, [place], CCC-SLP Speech-Language Pathologist [phone] Solid: Impaired Presentation: Self Fed Oral Phase Functional Implications: Prolonged oral transit;Impaired mastication;Oral residue        Arlana Lindau 02/14/2018,1:22 PM

## 2018-02-14 NOTE — Progress Notes (Signed)
During my assessment, patient not talking. Native language [place]. Will follow  Or mimic what you do. I attempted to find out what his preferences were regarding light, and called the nephew. He did not talk to nephew. He did nod his head, conversation is difficult Bed alarm on. Safety maintained.Marland Kitchen

## 2018-02-14 NOTE — Consult Note (Addendum)
Chief Complaint: Patient was seen in consultation today for ICA stenosis  Referring Physician(s): Dr. Mariea Clonts, Dr. Pearlean Brownie  Supervising Physician: Julieanne Cotton  Patient Status: [facility] - In-pt  History of Present Illness: Jorge Burns is a 77 y.o. male with past medical history of CVA 2 years ago presented to [facility] ED with facial weakness and headache with confusion.    He was found to have an acute CVA.   MR Brain 02/13/18 showed: 1. Multifocal acute ischemia within the left hemisphere, predominantly within the deep watershed zone and left ACA/MCA cortical watershed zone. No hemorrhage or mass effect. 2. Small subacute infarct within the right-sided white matter adjacent to the corpus callosum splenium. 3. No intracranial occlusion or high-grade stenosis. 4. Loss of enhancement of the proximal left internal carotid artery over a short segment with near-immediate reconstitution. This indicates either critical stenosis or occlusion. 5. 4 x 3 mm rounded focus projecting posteriorly from the proximal right internal carotid artery, most consistent with a small aneurysm. This might be artifactual secondary to the presence of atherosclerotic plaque at the bifurcation. If clinically warranted, this could be further characterized with CTA of the neck.  CTA Head and Neck 02/13/18 showed: 50% stenosis RIGHT ICA, with a 3-4 mm posterior projecting ulceration. This could serve as a source of distal emboli.  Critical stenosis LEFT ICA. Diminished caliber of the distal cervical segment. Surgical and/or neuro interventional consultation may be warranted. Findings discussed with neurology PA.  LEFT apical scarring, possible fungus ball, along with pneumomediastinum of uncertain significance. Chronic or acute tuberculous infection cannot completely be excluded. Formal chest CT is warranted for further evaluation.  IR consulted for cerebral angiogram at the request of Dr. Mariea Clonts.  Case  reviewed and approved by Dr. Corliss Skains.   Past Medical History:  Diagnosis Date  . Stroke [facility])    "a long time ago -- 2 years ago"    No past surgical history on file.  Allergies: Patient has no known allergies.  Medications: Prior to Admission medications   Medication Sig Start Date End Date Taking? Authorizing Provider  butalbital-acetaminophen-caffeine (FIORICET, ESGIC) [phone] MG tablet Take 1-2 tablets by mouth every 8 (eight) hours as needed for headache. 02/12/18 02/12/19 Yes Antony Madura, PA-C     No family history on file.  Social History   Socioeconomic History  . Marital status: Single    Spouse name: Not on file  . Number of children: Not on file  . Years of education: Not on file  . Highest education level: Not on file  Occupational History  . Not on file  Social Needs  . Financial resource strain: Not on file  . Food insecurity:    Worry: Not on file    Inability: Not on file  . Transportation needs:    Medical: Not on file    Non-medical: Not on file  Tobacco Use  . Smoking status: Never Smoker  . Smokeless tobacco: Never Used  Substance and Sexual Activity  . Alcohol use: No  . Drug use: No  . Sexual activity: Not on file  Lifestyle  . Physical activity:    Days per week: Not on file    Minutes per session: Not on file  . Stress: Not on file  Relationships  . Social connections:    Talks on phone: Not on file    Gets together: Not on file    Attends religious service: Not on file    Active member of club or  organization: Not on file    Attends meetings of clubs or organizations: Not on file    Relationship status: Not on file  Other Topics Concern  . Not on file  Social History Narrative  . Not on file     Review of Systems: A 12 point ROS discussed and pertinent positives are indicated in the HPI above.  All other systems are negative.  Review of Systems  Unable to perform ROS: Mental status change    Vital Signs: BP (!)  151/90 (BP Location: Right Arm)   Pulse 67   Temp 97.8 F (36.6 C) (Oral)   Resp 17   Ht  (1.676 m)   Wt 126 lb 1.7 oz (57.2 kg)   SpO2 100%   BMI 20.35 kg/m   Physical Exam  Constitutional: He appears well-developed.  Cardiovascular: Normal rate, regular rhythm and normal heart sounds.  Pulmonary/Chest: Effort normal and breath sounds normal. No respiratory distress.  Neurological: He is alert.  Following simple commands, no facial asymmetry, moving all extremities  Skin: Skin is warm and dry.  Nursing note and vitals reviewed.    MD Evaluation Airway: Other (comments) Airway comments: difficulty opening mouth-- may be due to confusion vs. weakness Heart: WNL Abdomen: WNL Chest/ Lungs: WNL ASA  Classification: 3 Mallampati/Airway Score: Three   Imaging: Dg Chest 2 View  Result Date: 02/13/2018 CLINICAL DATA:  TIA. EXAM: CHEST - 2 VIEW COMPARISON:  12/08/2015. FINDINGS: Mild cardiac enlargement. Aortic atherosclerotic calcifications noted. There is scarring and volume loss involving the left lung. Unchanged from comparison exam. No superimposed airspace consolidation, pulmonary edema, or pleural effusion. Pleuroparenchymal scarring and calcification overlying the left apex is similar to previous exam. IMPRESSION: 1. No acute cardiopulmonary abnormalities. 2.  Aortic Atherosclerosis (ICD10-I70.0). Electronically Signed   By: Signa Kell M.D.   On: 02/13/2018 09:00   Dg Abd 1 View  Result Date: 02/13/2018 CLINICAL DATA:  Screening for metal prior to MRI. EXAM: ABDOMEN - 1 VIEW COMPARISON:  None. FINDINGS: Metallic fragments are demonstrated in the soft tissues over the right proximal femur and in the left upper quadrant overlying the twelfth distal rib. Scattered gas and stool throughout the colon. No small or large bowel distention. No radiopaque stones. Degenerative changes in the spine. Soft tissue contours appear intact. IMPRESSION: Metallic foreign bodies are  demonstrated in the soft tissues over the right hip and in the left upper quadrant. Electronically Signed   By: Burman Nieves M.D.   On: 02/13/2018 04:56   Ct Head Wo Contrast  Result Date: 02/12/2018 CLINICAL DATA:  On Thursday, patient was evaluated for speech difficulty, left-sided weakness, and left eye visual changes. Now today patient presents with right-sided facial droop, abnormal speech, and slurring. EXAM: CT HEAD WITHOUT CONTRAST TECHNIQUE: Contiguous axial images were obtained from the base of the skull through the vertex without intravenous contrast. COMPARISON:  02/11/2018 FINDINGS: Brain: Mild diffuse cerebral atrophy. Focal area of encephalomalacia involving the right anterior parietal lobe consistent with old infarct. Patchy low-attenuation changes in the deep white matter consistent with small vessel ischemia. Old lacunar infarct in the left caudate. No mass-effect or midline shift. No abnormal extra-axial fluid collections. Gray-white matter junctions are distinct. Basal cisterns are not effaced. No acute intracranial hemorrhage. Vascular: Intracranial arterial vascular calcifications are present. Skull: Calvarium appears intact. Sinuses/Orbits: Mucosal thickening in the paranasal sinuses. No acute air-fluid levels. Hypoaeration of the right mastoid air cells. Other: None. IMPRESSION: No acute intracranial abnormalities. Chronic atrophy  and small vessel ischemic changes. Old right parietal infarct. Electronically Signed   By: Burman Nieves M.D.   On: 02/12/2018 23:57   Ct Head Wo Contrast  Result Date: 02/11/2018 CLINICAL DATA:  Headache and dizziness with blurry vision EXAM: CT HEAD WITHOUT CONTRAST TECHNIQUE: Contiguous axial images were obtained from the base of the skull through the vertex without intravenous contrast. COMPARISON:  11/28/2015 head CT FINDINGS: Brain: No acute territorial infarction, hemorrhage or intracranial mass. Encephalomalacia in the right temporal lobe as  before. Probable tiny focus of encephalomalacia in the high right parietal lobe, probably unchanged compared with 2017 comparison head CT. Mild atrophy. Stable ventricle size with mild ex vacuo dilatation of right lateral ventricle. Vascular: No hyperdense vessels.  Carotid vascular calcification Skull: Normal. Negative for fracture or focal lesion. Sinuses/Orbits: Mucosal thickening in the ethmoid sinuses. No acute orbital abnormality. Other: None IMPRESSION: 1. No definite CT evidence for acute intracranial abnormality. 2. Atrophy and right temporal lobe encephalomalacia. Electronically Signed   By: Jasmine Pang M.D.   On: 02/11/2018 20:05   Ct Angio Neck W Or Wo Contrast  Result Date: 02/13/2018 CLINICAL DATA:  Akinetic mediastinum. RIGHT facial weakness. RIGHT upper extremity weakness EXAM: CT ANGIOGRAPHY NECK TECHNIQUE: Multidetector CT imaging of the neck was performed using the standard protocol during bolus administration of intravenous contrast. Multiplanar CT image reconstructions and MIPs were obtained to evaluate the vascular anatomy. Carotid stenosis measurements (when applicable) are obtained utilizing NASCET criteria, using the distal internal carotid diameter as the denominator. CONTRAST:  50mL ISOVUE-370 IOPAMIDOL (ISOVUE-370) INJECTION 76% COMPARISON:  MRI brain earlier today demonstrates multiple LEFT-sided infarcts FINDINGS: Aortic arch: Standard branching. Imaged portion shows no evidence of aneurysm or dissection. No significant stenosis of the major arch vessel origins. Aortic atherosclerosis with calcification and intimal plaque. Right carotid system: Heavily calcified plaque begins in the distal common carotid artery just before the bifurcation. There is calcified and noncalcified plaque at the origin of the RIGHT internal carotid artery. Suspected 3-4 mm posterior ulceration. 50% stenosis of the proximal RIGHT ICA, based on luminal measurements of 2.0/3.9 proximal/distal. No  dissection. Left carotid system: Calcific and noncalcified plaque at the origin of the LEFT ICA. There is a critical stenosis just above the bifurcation, with a lumen too small to reliably measure, less than 1 mm diameter as seen on series 7, image 96. There is slightly reduced caliber, at least compared to the RIGHT side, of the LEFT cervical ICA, 3 mm diameter, further suggesting a proximal stenosis of hemodynamic significance. No dissection. Vertebral arteries: BILATERAL patent. LEFT dominant. No ostial narrowing of significance. Skeleton: Cervical spondylosis.  No worrisome osseous lesion. Other neck: No airway narrowing. 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 Thyroid sonography recommended. Poor dentition. Upper chest: There is scarring and volume loss in the LEFT hemithorax. There is a possible fungus ball, 11 x 11 mm within a LEFT apical cavity. Pneumomediastinum is present of uncertain etiology. RIGHT apical scarring. 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 This could serve as a source of distal emboli. Critical stenosis LEFT ICA. Diminished caliber of the distal cervical segment. Surgical and/or neuro interventional consultation may be warranted. Findings discussed with neurology PA. LEFT apical scarring, possible fungus ball, along with pneumomediastinum of uncertain significance. Chronic or acute tuberculous infection cannot completely be excluded. Formal chest CT is warranted for further evaluation. Electronically Signed   By: Elsie Stain M.D.   On:  02/13/2018 17:04   Mr Angiogram Head Wo Contrast  Result Date: 02/13/2018 CLINICAL DATA:  Acute onset weakness EXAM: MR HEAD WITHOUT CONTRAST MR CIRCLE OF WILLIS WITHOUT CONTRAST MRA OF THE NECK WITHOUT AND WITH CONTRAST TECHNIQUE: Multiplanar, multiecho pulse sequences of the brain, circle of willis and surrounding structures were obtained without intravenous contrast. Angiographic  images of the neck were obtained using MRA technique without and with intravenous contrast. CONTRAST:  12 mL gadobenate dimeglumine (MULTIHANCE) injection COMPARISON:  Head CT 02/12/2018 FINDINGS: MRI HEAD FINDINGS Brain: The midline structures are normal. There are numerous foci of abnormal diffusion restriction within the left hemisphere, predominantly within the cortical and deep watershed zones. There is a focus mildly hyperintense DWI signal at the right aspect of the corpus callosum splenium without corresponding ADC abnormality, likely a subacute infarct. There is no midline shift or mass effect. Old right parietal and temporal infarcts. Multifocal hyperintense T2-weighted signal in the white matter compatible with chronic ischemic microangiopathy. No mass lesion. No chronic microhemorrhage or cerebral amyloid angiopathy. No hydrocephalus, age advanced atrophy or lobar predominant volume loss. No dural abnormality or extra-axial collection. Skull and upper cervical spine: The visualized skull base, calvarium, upper cervical spine and extracranial soft tissues are normal. Sinuses/Orbits: Right mastoid effusion normal orbits. MRA HEAD FINDINGS Intracranial internal carotid arteries: Normal. Anterior cerebral arteries: Normal. Middle cerebral arteries: Normal. Posterior communicating arteries: Present on the right. Posterior cerebral arteries: Normal. Basilar artery: Normal. Vertebral arteries: Left dominant. Normal. Superior cerebellar arteries: Normal. Anterior inferior cerebellar arteries: Normal. Posterior inferior cerebellar arteries: Normal. MRA NECK FINDINGS Aortic arch: Normal 3 vessel aortic branching pattern. The visualized subclavian arteries are normal. Right carotid system: There is a small rounded focus measuring 4 x 3 mm projecting posteriorly from the proximal right internal carotid artery. There is no hemodynamically significant right carotid stenosis. Left carotid system: There is complete loss  of contrast enhancement within the proximal left internal carotid artery over a short segment. The remainder of the left internal carotid artery is normal. Vertebral arteries: Left dominant. Vertebral artery origins are normal. Vertebral arteries are normal in course and caliber to the vertebrobasilar confluence without stenosis or evidence of dissection. IMPRESSION: 1. Multifocal acute ischemia within the left hemisphere, predominantly within the deep watershed zone and left ACA/MCA cortical watershed zone. No hemorrhage or mass effect. 2. Small subacute infarct within the right-sided white matter adjacent to the corpus callosum splenium. 3. No intracranial occlusion or high-grade stenosis. 4. Loss of enhancement of the proximal left internal carotid artery over a short segment with near-immediate reconstitution. This indicates either critical stenosis or occlusion. 5. 4 x 3 mm rounded focus projecting posteriorly from the proximal right internal carotid artery, most consistent with a small aneurysm. This might be artifactual secondary to the presence of atherosclerotic plaque at the bifurcation. If clinically warranted, this could be further characterized with CTA of the neck. Electronically Signed   By: Deatra Robinson M.D.   On: 02/13/2018 06:21   Mr Angiogram Neck W Or Wo Contrast  Result Date: 02/13/2018 CLINICAL DATA:  Acute onset weakness EXAM: MR HEAD WITHOUT CONTRAST MR CIRCLE OF WILLIS WITHOUT CONTRAST MRA OF THE NECK WITHOUT AND WITH CONTRAST TECHNIQUE: Multiplanar, multiecho pulse sequences of the brain, circle of willis and surrounding structures were obtained without intravenous contrast. Angiographic images of the neck were obtained using MRA technique without and with intravenous contrast. CONTRAST:  12 mL gadobenate dimeglumine (MULTIHANCE) injection COMPARISON:  Head CT 02/12/2018 FINDINGS: MRI HEAD FINDINGS  Brain: The midline structures are normal. There are numerous foci of abnormal diffusion  restriction within the left hemisphere, predominantly within the cortical and deep watershed zones. There is a focus mildly hyperintense DWI signal at the right aspect of the corpus callosum splenium without corresponding ADC abnormality, likely a subacute infarct. There is no midline shift or mass effect. Old right parietal and temporal infarcts. Multifocal hyperintense T2-weighted signal in the white matter compatible with chronic ischemic microangiopathy. No mass lesion. No chronic microhemorrhage or cerebral amyloid angiopathy. No hydrocephalus, age advanced atrophy or lobar predominant volume loss. No dural abnormality or extra-axial collection. Skull and upper cervical spine: The visualized skull base, calvarium, upper cervical spine and extracranial soft tissues are normal. Sinuses/Orbits: Right mastoid effusion normal orbits. MRA HEAD FINDINGS Intracranial internal carotid arteries: Normal. Anterior cerebral arteries: Normal. Middle cerebral arteries: Normal. Posterior communicating arteries: Present on the right. Posterior cerebral arteries: Normal. Basilar artery: Normal. Vertebral arteries: Left dominant. Normal. Superior cerebellar arteries: Normal. Anterior inferior cerebellar arteries: Normal. Posterior inferior cerebellar arteries: Normal. MRA NECK FINDINGS Aortic arch: Normal 3 vessel aortic branching pattern. The visualized subclavian arteries are normal. Right carotid system: There is a small rounded focus measuring 4 x 3 mm projecting posteriorly from the proximal right internal carotid artery. There is no hemodynamically significant right carotid stenosis. Left carotid system: There is complete loss of contrast enhancement within the proximal left internal carotid artery over a short segment. The remainder of the left internal carotid artery is normal. Vertebral arteries: Left dominant. Vertebral artery origins are normal. Vertebral arteries are normal in course and caliber to the vertebrobasilar  confluence without stenosis or evidence of dissection. IMPRESSION: 1. Multifocal acute ischemia within the left hemisphere, predominantly within the deep watershed zone and left ACA/MCA cortical watershed zone. No hemorrhage or mass effect. 2. Small subacute infarct within the right-sided white matter adjacent to the corpus callosum splenium. 3. No intracranial occlusion or high-grade stenosis. 4. Loss of enhancement of the proximal left internal carotid artery over a short segment with near-immediate reconstitution. This indicates either critical stenosis or occlusion. 5. 4 x 3 mm rounded focus projecting posteriorly from the proximal right internal carotid artery, most consistent with a small aneurysm. This might be artifactual secondary to the presence of atherosclerotic plaque at the bifurcation. If clinically warranted, this could be further characterized with CTA of the neck. Electronically Signed   By: Deatra Robinson M.D.   On: 02/13/2018 06:21   Mr Brain Wo Contrast  Result Date: 02/13/2018 CLINICAL DATA:  Acute onset weakness EXAM: MR HEAD WITHOUT CONTRAST MR CIRCLE OF WILLIS WITHOUT CONTRAST MRA OF THE NECK WITHOUT AND WITH CONTRAST TECHNIQUE: Multiplanar, multiecho pulse sequences of the brain, circle of willis and surrounding structures were obtained without intravenous contrast. Angiographic images of the neck were obtained using MRA technique without and with intravenous contrast. CONTRAST:  12 mL gadobenate dimeglumine (MULTIHANCE) injection COMPARISON:  Head CT 02/12/2018 FINDINGS: MRI HEAD FINDINGS Brain: The midline structures are normal. There are numerous foci of abnormal diffusion restriction within the left hemisphere, predominantly within the cortical and deep watershed zones. There is a focus mildly hyperintense DWI signal at the right aspect of the corpus callosum splenium without corresponding ADC abnormality, likely a subacute infarct. There is no midline shift or mass effect. Old right  parietal and temporal infarcts. Multifocal hyperintense T2-weighted signal in the white matter compatible with chronic ischemic microangiopathy. No mass lesion. No chronic microhemorrhage or cerebral amyloid angiopathy. No hydrocephalus, age  advanced atrophy or lobar predominant volume loss. No dural abnormality or extra-axial collection. Skull and upper cervical spine: The visualized skull base, calvarium, upper cervical spine and extracranial soft tissues are normal. Sinuses/Orbits: Right mastoid effusion normal orbits. MRA HEAD FINDINGS Intracranial internal carotid arteries: Normal. Anterior cerebral arteries: Normal. Middle cerebral arteries: Normal. Posterior communicating arteries: Present on the right. Posterior cerebral arteries: Normal. Basilar artery: Normal. Vertebral arteries: Left dominant. Normal. Superior cerebellar arteries: Normal. Anterior inferior cerebellar arteries: Normal. Posterior inferior cerebellar arteries: Normal. MRA NECK FINDINGS Aortic arch: Normal 3 vessel aortic branching pattern. The visualized subclavian arteries are normal. Right carotid system: There is a small rounded focus measuring 4 x 3 mm projecting posteriorly from the proximal right internal carotid artery. There is no hemodynamically significant right carotid stenosis. Left carotid system: There is complete loss of contrast enhancement within the proximal left internal carotid artery over a short segment. The remainder of the left internal carotid artery is normal. Vertebral arteries: Left dominant. Vertebral artery origins are normal. Vertebral arteries are normal in course and caliber to the vertebrobasilar confluence without stenosis or evidence of dissection. IMPRESSION: 1. Multifocal acute ischemia within the left hemisphere, predominantly within the deep watershed zone and left ACA/MCA cortical watershed zone. No hemorrhage or mass effect. 2. Small subacute infarct within the right-sided white matter adjacent to  the corpus callosum splenium. 3. No intracranial occlusion or high-grade stenosis. 4. Loss of enhancement of the proximal left internal carotid artery over a short segment with near-immediate reconstitution. This indicates either critical stenosis or occlusion. 5. 4 x 3 mm rounded focus projecting posteriorly from the proximal right internal carotid artery, most consistent with a small aneurysm. This might be artifactual secondary to the presence of atherosclerotic plaque at the bifurcation. If clinically warranted, this could be further characterized with CTA of the neck. Electronically Signed   By: Deatra Robinson M.D.   On: 02/13/2018 06:21    Labs:  CBC: Recent Labs    02/11/18 1837  02/13/18 0058 02/13/18 0102 02/13/18 0855 02/14/18 0842  WBC 6.1  --  6.6  --  6.2 6.9  HGB 13.5   < > 12.9* 13.3 13.6 13.2  HCT 42.2   < > 39.6 39.0 41.4 40.4  PLT 238  --  221  --  247 247   < > = values in this interval not displayed.    COAGS: Recent Labs    02/11/18 1837  INR 1.02  APTT 34    BMP: Recent Labs    02/11/18 1837 02/11/18 1900 02/13/18 0058 02/13/18 0102 02/13/18 0855 02/14/18 0842  NA 138 140 138 142  --  140  K 3.8 3.8 3.3* 3.4*  --  4.1  CL 103 102 107 104  --  111  CO2 25  --  25  --   --  23  GLUCOSE 113* 113* 112* 109*  --  94  BUN 23* 25* 17 20  --  9  CALCIUM 9.1  --  8.3*  --   --  8.5*  CREATININE 1.57* 1.50* 1.55* 1.50* 1.07 1.10  GFRNONAA 41*  --  42*  --  >60 >60  GFRAA 48*  --  49*  --  >60 >60    LIVER FUNCTION TESTS: Recent Labs    02/11/18 1837 02/13/18 0058 02/14/18 0842  BILITOT 0.4 0.4 0.6  AST ALT 17 15* 14*  ALKPHOS 74 58 59  PROT 7.8 6.8 6.7  ALBUMIN 3.7 3.1* 3.2*    TUMOR MARKERS: No results for input(s): AFPTM, CEA, CA199, CHROMGRNA in the last 8760 hours.  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 Remains confused although following simple commands.   IR  consulted for cerebral angiogram.  Dr. Corliss Skains has reviewed and approved case.  Called and discussed case with patient's family.  Patient to be NPO after midnight.  Lovenox last given 11AM.   Patient has received 1 dose of aspirin 81 mg and Plavix 75 mg.   Risks and benefits were discussed with the patient including, but not limited to bleeding, infection, vascular injury or contrast induced renal failure.  This interventional procedure involves the use of X-rays and because of the nature of the planned procedure, it is possible that we will have prolonged use of X-ray fluoroscopy.  Potential radiation risks to you include (but are not limited to) the following: - A slightly elevated risk for cancer  several years later in life. This risk is typically less than 0.5% percent. This risk is low in comparison to the normal incidence of human cancer, which is 33% for women and 50% for men according to the [employer]. - Radiation induced injury can include skin redness, resembling a rash, tissue breakdown / ulcers and hair loss (which can be temporary or permanent).   The likelihood of either of these occurring depends on the difficulty of the procedure and whether you are sensitive to radiation due to previous procedures, disease, or genetic conditions.   IF your procedure requires a prolonged use of radiation, you will be notified and given written instructions for further action.  It is your responsibility to monitor the irradiated area for the 2 weeks following the procedure and to notify your physician if you are concerned that you have suffered a radiation induced injury.    All of the patient's questions were answered, patient is agreeable to proceed.  Consent signed and in chart.  Thank you for this interesting consult.  I greatly enjoyed meeting [facility] and look forward to participating in their care.  A copy of this report was sent to the requesting provider on this  date.  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

## 2018-02-14 NOTE — Progress Notes (Addendum)
Patient Demographics:    Jorge Burns, is a 77 y.o. male, DOB - 04-17-1941, ZOX:[id number]  Admit date - 02/12/2018   Admitting Physician Tonye Royalty, DO  Outpatient Primary MD for the patient is Patient, No Pcp Per  LOS - 1   Chief Complaint  Patient presents with  . Stroke Symptoms        Subjective:    [name] today has no fevers, no emesis,  No chest pain, patient's caregiver Jorge Burns and Jorge Burns who are English speaking available   Assessment  & Plan :    Active Problems:   CVA (cerebral vascular accident) (HCC)  02/13/18 CTA neck IMPRESSION: 50% stenosis RIGHT ICA, with a 3-4 mm posterior projecting ulceration. This could serve as a source of distal emboli. Critical stenosis LEFT ICA. Diminished caliber of the distal cervical segment.  02/13/18 MRI Brain 1. Multifocal acute ischemia within the left hemisphere, predominantly within the deep watershed zone and left ACA/MCA cortical watershed zone. No hemorrhage or mass effect. 2. Small subacute infarct within the right-sided white matter adjacent to the corpus callosum splenium.  Brief Summary 77 year old [place] male with history of prior stroke and hypertension admitted on 02/13/2018 with acute strokes and found to have critical left ICA stenosis.  As per history obtained from patient's caregiver Jorge Burns and Jorge Burns who are both speak [place] patient's symptoms including headache, visual disturbance weakness and gait disturbance started on Thursday, 02/10/2018.  Patient was subsequently seen in the ED on Friday, 02/11/2018 initial CT head was without acute findings, patient was told he may have a migraine headache and discharged from the ED according to family.  Family brought patient back on 02/12/2018 due to persistent neuro symptoms.  Admitted 02/13/2018 as noted above with acute left ACA/MCA strokes with aphasia, right-sided  weakness, dysphagia and cognitive concerns    Plan:- 1)Acute CVA/Left ACA/MCA strokes -continue to monitor on telemetry monitored unit , neurology input appreciated, recommend aspirin with Plavix and Lipitor, PTA despite history of prior stroke patient was not on aspirin or statin.  Family aphasia, right-sided weakness especially right upper extremity, dysphagia and cognitive concerns persist,  echocardiogram to rule out intracardiac thrombus  and to evaluate EF is pending,  CTA Neck with critical left ICA stenosis,  discussed with on-call neuro interventional radiologist--Dr. - T. Deveshwar   (pager [phone]), plan is for cerebral angiogram on 02/15/2018. We will allow some permissive hypertension in view of  acute stroke and critical left ICA stenosis, avoid precipitous drop in blood pressure. Use Hydralazine  or Labetalol 10 mg iv every 4 hrs as needed for systolic blood pressure over 220 mmhg , , A1c is only 5.7 however LDL is 151, LDL goal should be less than 70. Lovenox will be on hold to allow for cerebral angiogram on 02/15/2018   2)Diet/FEN/Dysphagia--- in the setting of acute stroke, speech pathologist eval appreciated, recommend dysphagia 2 diet with nectar thickened liquids, give IV fluids given all the contrast load patient has received in the setting of poor oral intake to limit risk of contrast-induced nephropathy  3)Social/Ethics-plan of care discussed with patient's caregiver Ms. Jorge Burns (Nephew's wife) by speaker phone and Jorge Burns (nephew) at bedside who are [place] speaking available ,  questions answered, patient is a full code  4)LEFT apical scarring, possible fungus ball, along with pneumomediastinum of uncertain significance--- CTA neck from 02/13/2018 with findings noted above, as per family members no prior history of tuberculosis.  Discussed with radiologist who advises CTA chest to be done after cerebral arteriogram and stroke work-is completed.  Patient has had significant  contrast load since admission, we hydrate follow creatinine and do CTA chest over the next few days if renal function allows and patient remaines stable  Code Status : full   Disposition Plan  : TBD  Consults  : Neurology/neuro interventional radiology/speech pathologist/PT OT   DVT Prophylaxis  :  Lovenox Lovenox will be on hold to allow for cerebral angiogram on 02/15/2018  Lab Results  Component Value Date   PLT 247 02/14/2018    Inpatient Medications  Scheduled Meds: . aspirin  81 mg Oral Daily  . atorvastatin  40 mg Oral q1800  . clopidogrel  75 mg Oral Daily  . glycopyrrolate  0.2 mg Intravenous BID  . potassium chloride  20 mEq Oral BID   Continuous Infusions: . sodium chloride 150 mL/hr at 02/14/18 0118   PRN Meds:.acetaminophen **OR** acetaminophen (TYLENOL) oral liquid 160 mg/5 mL **OR** acetaminophen, hydrALAZINE, senna-docusate    Anti-infectives (From admission, onward)   None        Objective:   Vitals:   02/14/18 0313 02/14/18 0805 02/14/18 0818 02/14/18 1225  BP: (!) 149/77 (!) 220/205 (!) 151/90 (!) 142/73  Pulse: 61 72 67 64  Resp: 18 17  17   Temp: 97.7 F (36.5 C) 97.8 F (36.6 C)  98 F (36.7 C)  TempSrc: Oral Oral  Oral  SpO2: 100% 100%  97%  Weight:      Height:        Wt Readings from Last 3 Encounters:  02/13/18 57.2 kg (126 lb 1.7 oz)  02/11/18 63.5 kg (140 lb)  02/19/16 71.7 kg (158 lb)     Intake/Output Summary (Last 24 hours) at 02/14/2018 1442 Last data filed at 02/14/2018 1200 Gross per 24 hour  Intake 3571.25 ml  Output 950 ml  Net 2621.25 ml     Physical Exam  Gen:- Awake , less lethargic, able to follow simple commands  HEENT:- [place].AT, No sclera icterus Neck-Supple Neck,No JVD,.  Lungs-  CTAB good air movement CV- S1, S2 normal Abd-  +ve B.Sounds, Abd Soft, No tenderness,    Extremity/Skin:- No  edema,   good pulses Psych-affect is appropriate,  Neuro-Neurologial Exam: Mental Status: Patient is awake,  limited exam due to aphasia , no significant neglect , less lethargic Cranial Nerves: II: Visual Fields are full. Pupils are equal, round, and reactive to light.   III,IV, VI: EOMI without ptosis  V: Facial asymmetry noted VII: Facial asymmetry noted.  VIII: hearing is intact to voice X: Uvula elevates symmetrically XI: Shoulder shrug is symmetric. XII: tongue is midline without atrophy or fasciculations.  Motor: Tone is normal. Bulk is normal.  Right-sided hemiparesis especially right upper extremity.  Sensory: Difficult to evaluate due to aphasia and language barrier Cerebellar: Gait was not tested unable to elicit presence or absence of ataxia     Data Review:   Micro Results No results found for this or any previous visit (from the past 240 hour(s)).  Radiology Reports Dg Chest 2 View  Result Date: 02/13/2018 CLINICAL DATA:  TIA. EXAM: CHEST - 2 VIEW COMPARISON:  12/08/2015. FINDINGS: Mild cardiac enlargement. Aortic atherosclerotic calcifications noted. There  is scarring and volume loss involving the left lung. Unchanged from comparison exam. No superimposed airspace consolidation, pulmonary edema, or pleural effusion. Pleuroparenchymal scarring and calcification overlying the left apex is similar to previous exam. IMPRESSION: 1. No acute cardiopulmonary abnormalities. 2.  Aortic Atherosclerosis (ICD10-I70.0). Electronically Signed   By: Signa Kell M.D.   On: 02/13/2018 09:00   Dg Abd 1 View  Result Date: 02/13/2018 CLINICAL DATA:  Screening for metal prior to MRI. EXAM: ABDOMEN - 1 VIEW COMPARISON:  None. FINDINGS: Metallic fragments are demonstrated in the soft tissues over the right proximal femur and in the left upper quadrant overlying the twelfth distal rib. Scattered gas and stool throughout the colon. No small or large bowel distention. No radiopaque stones. Degenerative changes in the spine. Soft tissue contours appear intact. IMPRESSION: Metallic foreign bodies are  demonstrated in the soft tissues over the right hip and in the left upper quadrant. Electronically Signed   By: Burman Nieves M.D.   On: 02/13/2018 04:56   Ct Head Wo Contrast  Result Date: 02/12/2018 CLINICAL DATA:  On Thursday, patient was evaluated for speech difficulty, left-sided weakness, and left eye visual changes. Now today patient presents with right-sided facial droop, abnormal speech, and slurring. EXAM: CT HEAD WITHOUT CONTRAST TECHNIQUE: Contiguous axial images were obtained from the base of the skull through the vertex without intravenous contrast. COMPARISON:  02/11/2018 FINDINGS: Brain: Mild diffuse cerebral atrophy. Focal area of encephalomalacia involving the right anterior parietal lobe consistent with old infarct. Patchy low-attenuation changes in the deep white matter consistent with small vessel ischemia. Old lacunar infarct in the left caudate. No mass-effect or midline shift. No abnormal extra-axial fluid collections. Gray-white matter junctions are distinct. Basal cisterns are not effaced. No acute intracranial hemorrhage. Vascular: Intracranial arterial vascular calcifications are present. Skull: Calvarium appears intact. Sinuses/Orbits: Mucosal thickening in the paranasal sinuses. No acute air-fluid levels. Hypoaeration of the right mastoid air cells. Other: None. IMPRESSION: No acute intracranial abnormalities. Chronic atrophy and small vessel ischemic changes. Old right parietal infarct. Electronically Signed   By: Burman Nieves M.D.   On: 02/12/2018 23:57   Ct Head Wo Contrast  Result Date: 02/11/2018 CLINICAL DATA:  Headache and dizziness with blurry vision EXAM: CT HEAD WITHOUT CONTRAST TECHNIQUE: Contiguous axial images were obtained from the base of the skull through the vertex without intravenous contrast. COMPARISON:  11/28/2015 head CT FINDINGS: Brain: No acute territorial infarction, hemorrhage or intracranial mass. Encephalomalacia in the right temporal lobe as  before. Probable tiny focus of encephalomalacia in the high right parietal lobe, probably unchanged compared with 2017 comparison head CT. Mild atrophy. Stable ventricle size with mild ex vacuo dilatation of right lateral ventricle. Vascular: No hyperdense vessels.  Carotid vascular calcification Skull: Normal. Negative for fracture or focal lesion. Sinuses/Orbits: Mucosal thickening in the ethmoid sinuses. No acute orbital abnormality. Other: None IMPRESSION: 1. No definite CT evidence for acute intracranial abnormality. 2. Atrophy and right temporal lobe encephalomalacia. Electronically Signed   By: Jasmine Pang M.D.   On: 02/11/2018 20:05   Ct Angio Neck W Or Wo Contrast  Result Date: 02/13/2018 CLINICAL DATA:  Akinetic mediastinum. RIGHT facial weakness. RIGHT upper extremity weakness EXAM: CT ANGIOGRAPHY NECK TECHNIQUE: Multidetector CT imaging of the neck was performed using the standard protocol during bolus administration of intravenous contrast. Multiplanar CT image reconstructions and MIPs were obtained to evaluate the vascular anatomy. Carotid stenosis measurements (when applicable) are obtained utilizing NASCET criteria, using the distal internal carotid diameter  as the denominator. CONTRAST:  50mL ISOVUE-370 IOPAMIDOL (ISOVUE-370) INJECTION 76% COMPARISON:  MRI brain earlier today demonstrates multiple LEFT-sided infarcts FINDINGS: Aortic arch: Standard branching. Imaged portion shows no evidence of aneurysm or dissection. No significant stenosis of the major arch vessel origins. Aortic atherosclerosis with calcification and intimal plaque. Right carotid system: Heavily calcified plaque begins in the distal common carotid artery just before the bifurcation. There is calcified and noncalcified plaque at the origin of the RIGHT internal carotid artery. Suspected 3-4 mm posterior ulceration. 50% stenosis of the proximal RIGHT ICA, based on luminal measurements of 2.0/3.9 proximal/distal. No  dissection. Left carotid system: Calcific and noncalcified plaque at the origin of the LEFT ICA. There is a critical stenosis just above the bifurcation, with a lumen too small to reliably measure, less than 1 mm diameter as seen on series 7, image 96. There is slightly reduced caliber, at least compared to the RIGHT side, of the LEFT cervical ICA, 3 mm diameter, further suggesting a proximal stenosis of hemodynamic significance. No dissection. Vertebral arteries: BILATERAL patent. LEFT dominant. No ostial narrowing of significance. Skeleton: Cervical spondylosis.  No worrisome osseous lesion. Other neck: No airway narrowing. 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 Thyroid sonography recommended. Poor dentition. Upper chest: There is scarring and volume loss in the LEFT hemithorax. There is a possible fungus ball, 11 x 11 mm within a LEFT apical cavity. Pneumomediastinum is present of uncertain etiology. RIGHT apical scarring. 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 This could serve as a source of distal emboli. Critical stenosis LEFT ICA. Diminished caliber of the distal cervical segment. Surgical and/or neuro interventional consultation may be warranted. Findings discussed with neurology PA. LEFT apical scarring, possible fungus ball, along with pneumomediastinum of uncertain significance. Chronic or acute tuberculous infection cannot completely be excluded. Formal chest CT is warranted for further evaluation. Electronically Signed   By: Elsie Stain M.D.   On: 02/13/2018 17:04   Jorge Angiogram Head Wo Contrast  Result Date: 02/13/2018 CLINICAL DATA:  Acute onset weakness EXAM: Jorge HEAD WITHOUT CONTRAST Jorge CIRCLE OF WILLIS WITHOUT CONTRAST MRA OF THE NECK WITHOUT AND WITH CONTRAST TECHNIQUE: Multiplanar, multiecho pulse sequences of the brain, circle of willis and surrounding structures were obtained without intravenous contrast. Angiographic  images of the neck were obtained using MRA technique without and with intravenous contrast. CONTRAST:  12 mL gadobenate dimeglumine (MULTIHANCE) injection COMPARISON:  Head CT 02/12/2018 FINDINGS: MRI HEAD FINDINGS Brain: The midline structures are normal. There are numerous foci of abnormal diffusion restriction within the left hemisphere, predominantly within the cortical and deep watershed zones. There is a focus mildly hyperintense DWI signal at the right aspect of the corpus callosum splenium without corresponding ADC abnormality, likely a subacute infarct. There is no midline shift or mass effect. Old right parietal and temporal infarcts. Multifocal hyperintense T2-weighted signal in the white matter compatible with chronic ischemic microangiopathy. No mass lesion. No chronic microhemorrhage or cerebral amyloid angiopathy. No hydrocephalus, age advanced atrophy or lobar predominant volume loss. No dural abnormality or extra-axial collection. Skull and upper cervical spine: The visualized skull base, calvarium, upper cervical spine and extracranial soft tissues are normal. Sinuses/Orbits: Right mastoid effusion normal orbits. MRA HEAD FINDINGS Intracranial internal carotid arteries: Normal. Anterior cerebral arteries: Normal. Middle cerebral arteries: Normal. Posterior communicating arteries: Present on the right. Posterior cerebral arteries: Normal. Basilar artery: Normal. Vertebral arteries: Left dominant. Normal. Superior cerebellar arteries: Normal. Anterior  inferior cerebellar arteries: Normal. Posterior inferior cerebellar arteries: Normal. MRA NECK FINDINGS Aortic arch: Normal 3 vessel aortic branching pattern. The visualized subclavian arteries are normal. Right carotid system: There is a small rounded focus measuring 4 x 3 mm projecting posteriorly from the proximal right internal carotid artery. There is no hemodynamically significant right carotid stenosis. Left carotid system: There is complete loss  of contrast enhancement within the proximal left internal carotid artery over a short segment. The remainder of the left internal carotid artery is normal. Vertebral arteries: Left dominant. Vertebral artery origins are normal. Vertebral arteries are normal in course and caliber to the vertebrobasilar confluence without stenosis or evidence of dissection. IMPRESSION: 1. Multifocal acute ischemia within the left hemisphere, predominantly within the deep watershed zone and left ACA/MCA cortical watershed zone. No hemorrhage or mass effect. 2. Small subacute infarct within the right-sided white matter adjacent to the corpus callosum splenium. 3. No intracranial occlusion or high-grade stenosis. 4. Loss of enhancement of the proximal left internal carotid artery over a short segment with near-immediate reconstitution. This indicates either critical stenosis or occlusion. 5. 4 x 3 mm rounded focus projecting posteriorly from the proximal right internal carotid artery, most consistent with a small aneurysm. This might be artifactual secondary to the presence of atherosclerotic plaque at the bifurcation. If clinically warranted, this could be further characterized with CTA of the neck. Electronically Signed   By: Deatra Robinson M.D.   On: 02/13/2018 06:21   Jorge Angiogram Neck W Or Wo Contrast  Result Date: 02/13/2018 CLINICAL DATA:  Acute onset weakness EXAM: Jorge HEAD WITHOUT CONTRAST Jorge CIRCLE OF WILLIS WITHOUT CONTRAST MRA OF THE NECK WITHOUT AND WITH CONTRAST TECHNIQUE: Multiplanar, multiecho pulse sequences of the brain, circle of willis and surrounding structures were obtained without intravenous contrast. Angiographic images of the neck were obtained using MRA technique without and with intravenous contrast. CONTRAST:  12 mL gadobenate dimeglumine (MULTIHANCE) injection COMPARISON:  Head CT 02/12/2018 FINDINGS: MRI HEAD FINDINGS Brain: The midline structures are normal. There are numerous foci of abnormal diffusion  restriction within the left hemisphere, predominantly within the cortical and deep watershed zones. There is a focus mildly hyperintense DWI signal at the right aspect of the corpus callosum splenium without corresponding ADC abnormality, likely a subacute infarct. There is no midline shift or mass effect. Old right parietal and temporal infarcts. Multifocal hyperintense T2-weighted signal in the white matter compatible with chronic ischemic microangiopathy. No mass lesion. No chronic microhemorrhage or cerebral amyloid angiopathy. No hydrocephalus, age advanced atrophy or lobar predominant volume loss. No dural abnormality or extra-axial collection. Skull and upper cervical spine: The visualized skull base, calvarium, upper cervical spine and extracranial soft tissues are normal. Sinuses/Orbits: Right mastoid effusion normal orbits. MRA HEAD FINDINGS Intracranial internal carotid arteries: Normal. Anterior cerebral arteries: Normal. Middle cerebral arteries: Normal. Posterior communicating arteries: Present on the right. Posterior cerebral arteries: Normal. Basilar artery: Normal. Vertebral arteries: Left dominant. Normal. Superior cerebellar arteries: Normal. Anterior inferior cerebellar arteries: Normal. Posterior inferior cerebellar arteries: Normal. MRA NECK FINDINGS Aortic arch: Normal 3 vessel aortic branching pattern. The visualized subclavian arteries are normal. Right carotid system: There is a small rounded focus measuring 4 x 3 mm projecting posteriorly from the proximal right internal carotid artery. There is no hemodynamically significant right carotid stenosis. Left carotid system: There is complete loss of contrast enhancement within the proximal left internal carotid artery over a short segment. The remainder of the left internal carotid artery is normal. Vertebral  arteries: Left dominant. Vertebral artery origins are normal. Vertebral arteries are normal in course and caliber to the vertebrobasilar  confluence without stenosis or evidence of dissection. IMPRESSION: 1. Multifocal acute ischemia within the left hemisphere, predominantly within the deep watershed zone and left ACA/MCA cortical watershed zone. No hemorrhage or mass effect. 2. Small subacute infarct within the right-sided white matter adjacent to the corpus callosum splenium. 3. No intracranial occlusion or high-grade stenosis. 4. Loss of enhancement of the proximal left internal carotid artery over a short segment with near-immediate reconstitution. This indicates either critical stenosis or occlusion. 5. 4 x 3 mm rounded focus projecting posteriorly from the proximal right internal carotid artery, most consistent with a small aneurysm. This might be artifactual secondary to the presence of atherosclerotic plaque at the bifurcation. If clinically warranted, this could be further characterized with CTA of the neck. Electronically Signed   By: Deatra Robinson M.D.   On: 02/13/2018 06:21   Jorge Brain Wo Contrast  Result Date: 02/13/2018 CLINICAL DATA:  Acute onset weakness EXAM: Jorge HEAD WITHOUT CONTRAST Jorge CIRCLE OF WILLIS WITHOUT CONTRAST MRA OF THE NECK WITHOUT AND WITH CONTRAST TECHNIQUE: Multiplanar, multiecho pulse sequences of the brain, circle of willis and surrounding structures were obtained without intravenous contrast. Angiographic images of the neck were obtained using MRA technique without and with intravenous contrast. CONTRAST:  12 mL gadobenate dimeglumine (MULTIHANCE) injection COMPARISON:  Head CT 02/12/2018 FINDINGS: MRI HEAD FINDINGS Brain: The midline structures are normal. There are numerous foci of abnormal diffusion restriction within the left hemisphere, predominantly within the cortical and deep watershed zones. There is a focus mildly hyperintense DWI signal at the right aspect of the corpus callosum splenium without corresponding ADC abnormality, likely a subacute infarct. There is no midline shift or mass effect. Old right  parietal and temporal infarcts. Multifocal hyperintense T2-weighted signal in the white matter compatible with chronic ischemic microangiopathy. No mass lesion. No chronic microhemorrhage or cerebral amyloid angiopathy. No hydrocephalus, age advanced atrophy or lobar predominant volume loss. No dural abnormality or extra-axial collection. Skull and upper cervical spine: The visualized skull base, calvarium, upper cervical spine and extracranial soft tissues are normal. Sinuses/Orbits: Right mastoid effusion normal orbits. MRA HEAD FINDINGS Intracranial internal carotid arteries: Normal. Anterior cerebral arteries: Normal. Middle cerebral arteries: Normal. Posterior communicating arteries: Present on the right. Posterior cerebral arteries: Normal. Basilar artery: Normal. Vertebral arteries: Left dominant. Normal. Superior cerebellar arteries: Normal. Anterior inferior cerebellar arteries: Normal. Posterior inferior cerebellar arteries: Normal. MRA NECK FINDINGS Aortic arch: Normal 3 vessel aortic branching pattern. The visualized subclavian arteries are normal. Right carotid system: There is a small rounded focus measuring 4 x 3 mm projecting posteriorly from the proximal right internal carotid artery. There is no hemodynamically significant right carotid stenosis. Left carotid system: There is complete loss of contrast enhancement within the proximal left internal carotid artery over a short segment. The remainder of the left internal carotid artery is normal. Vertebral arteries: Left dominant. Vertebral artery origins are normal. Vertebral arteries are normal in course and caliber to the vertebrobasilar confluence without stenosis or evidence of dissection. IMPRESSION: 1. Multifocal acute ischemia within the left hemisphere, predominantly within the deep watershed zone and left ACA/MCA cortical watershed zone. No hemorrhage or mass effect. 2. Small subacute infarct within the right-sided white matter adjacent to  the corpus callosum splenium. 3. No intracranial occlusion or high-grade stenosis. 4. Loss of enhancement of the proximal left internal carotid artery over a short segment with near-immediate  reconstitution. This indicates either critical stenosis or occlusion. 5. 4 x 3 mm rounded focus projecting posteriorly from the proximal right internal carotid artery, most consistent with a small aneurysm. This might be artifactual secondary to the presence of atherosclerotic plaque at the bifurcation. If clinically warranted, this could be further characterized with CTA of the neck. Electronically Signed   By: Deatra Robinson M.D.   On: 02/13/2018 06:21     CBC Recent Labs  Lab 02/11/18 1837 02/11/18 1900 02/13/18 0058 02/13/18 0102 02/13/18 0855 02/14/18 0842  WBC 6.1  --  6.6  --  6.2 6.9  HGB 13.5 14.6 12.9* 13.3 13.6 13.2  HCT 42.2 43.0 39.6 39.0 41.4 40.4  PLT 238  --  221  --  247 247  MCV 76.0*  --  75.3*  --  73.7* 74.5*  MCH 24.3*  --  24.5*  --  24.2* 24.4*  MCHC 32.0  --  32.6  --  32.9 32.7  RDW 15.5  --  15.7*  --  15.2 15.6*  LYMPHSABS 2.4  --  2.1  --   --   --   MONOABS 0.9  --  0.7  --   --   --   EOSABS 0.1  --  0.2  --   --   --   BASOSABS 0.0  --  0.0  --   --   --     Chemistries  Recent Labs  Lab 02/11/18 1837 02/11/18 1900 02/13/18 0058 02/13/18 0102 02/13/18 0855 02/14/18 0842  NA 138 140 138 142  --  140  K 3.8 3.8 3.3* 3.4*  --  4.1  CL 103 102 107 104  --  111  CO2 25  --  25  --   --  23  GLUCOSE 113* 113* 112* 109*  --  94  BUN 23* 25* 17 20  --  9  CREATININE 1.57* 1.50* 1.55* 1.50* 1.07 1.10  CALCIUM 9.1  --  8.3*  --   --  8.5*  AST 21  --  18  --   --  16  ALT 17  --  15*  --   --  14*  ALKPHOS 74  --  58  --   --  59  BILITOT 0.4  --  0.4  --   --  0.6   ------------------------------------------------------------------------------------------------------------------ Recent Labs    02/13/18 0855  CHOL 213*  HDL 42  LDLCALC 151*  TRIG 98    CHOLHDL 5.1    Lab Results  Component Value Date   HGBA1C 5.7 (H) 02/13/2018   ------------------------------------------------------------------------------------------------------------------ No results for input(s): TSH, T4TOTAL, T3FREE, THYROIDAB in the last 72 hours.  Invalid input(s): FREET3 ------------------------------------------------------------------------------------------------------------------ No results for input(s): VITAMINB12, FOLATE, FERRITIN, TIBC, IRON, RETICCTPCT in the last 72 hours.  Coagulation profile Recent Labs  Lab 02/11/18 1837  INR 1.02    No results for input(s): DDIMER in the last 72 hours.  Cardiac Enzymes Recent Labs  Lab 02/13/18 0855 02/13/18 1439 02/13/18 2007  TROPONINI <0.03 <0.03 <0.03   ------------------------------------------------------------------------------------------------------------------ No results found for: BNP   Shon Hale M.D on 02/14/2018 at 2:42 PM  Between 7am to 7pm - Pager - [phone]  After 7pm go to [email or web address] - password [email or web address]  [facility] -  Office  [phone]   Voice Recognition Reubin [place] dictation system was used to create this note, attempts have been made to correct errors. Please contact the author with questions and/or clarifications.

## 2018-02-14 NOTE — Evaluation (Signed)
Occupational Therapy Evaluation Patient Details Name: Jorge Burns MRN: [id number] DOB: 1941-08-25 Today's Date: 02/14/2018    History of Present Illness Jorge Burns is a 77 y.o. male with a known history of CVA in 2017, HTN presents to the emergency department for evaluation of facial and right sided weakness associated with headache and slurred speech which began today. MRI: Multifocal acute ischemia within the left hemisphere,predominantly within the deep watershed zone and left ACA/MCA cortical watershed zone. No hemorrhage or mass effect. Small subacute infarct within the right-sided white matter adjacent to the corpus callosum splenium.   Clinical Impression   This 77 yo male admitted and underwent above presents to acute OT with decreased balance, decreased mobility, decreased use of RUE and RLE, decreased vision all affecting his PLOF of being totally independent with basic ADLs, IADLs, and working full time. He will benefit from acute OT with follow up OT on CIR.     Follow Up Recommendations  CIR;Supervision/Assistance - 24 hour    Equipment Recommendations  Other (comment)(TBD at next vneue)       Precautions / Restrictions Precautions Precautions: Fall Restrictions Weight Bearing Restrictions: No      Mobility Bed Mobility Overal bed mobility: Needs Assistance Bed Mobility: Supine to Sit     Supine to sit: Min assist;HOB elevated        Transfers Overall transfer level: Needs assistance Equipment used: 2 person hand held assist Transfers: Sit to/from Stand Sit to Stand: +2 physical assistance;Min assist              Balance Overall balance assessment: Needs assistance Sitting-balance support: Single extremity supported;Feet supported Sitting balance-Leahy Scale: Fair     Standing balance support: Bilateral upper extremity supported Standing balance-Leahy Scale: Poor                             ADL either performed or assessed with clinical  judgement   ADL Overall ADL's : Needs assistance/impaired Eating/Feeding: NPO   Grooming: Moderate assistance;Sitting   Upper Body Bathing: Moderate assistance;Sitting   Lower Body Bathing: Maximal assistance Lower Body Bathing Details (indicate cue type and reason): min A +2 sit<>stand Upper Body Dressing : Maximal assistance;Sitting   Lower Body Dressing: Total assistance Lower Body Dressing Details (indicate cue type and reason): min A +2 sit<>stand Toilet Transfer: Minimal assistance;+2 for physical assistance;Ambulation Toilet Transfer Details (indicate cue type and reason): Bil HHA Toileting- Clothing Manipulation and Hygiene: Total assistance Toileting - Clothing Manipulation Details (indicate cue type and reason): min A +2 sit<>stand             Vision Baseline Vision/History: No visual deficits Additional Comments: Pt appears to have right visual field cut, but hard to determine due to language barrier and question receptive difficulties. Pt unable to track past midline to right            Pertinent Vitals/Pain Pain Assessment: Faces Faces Pain Scale: No hurt     Hand Dominance Right   Extremity/Trunk Assessment Upper Extremity Assessment Upper Extremity Assessment: RUE deficits/detail RUE Deficits / Details: able to raise bend RUE a little at elbow actively but no other movement seent RUE Coordination: decreased fine motor;decreased gross motor   Lower Extremity Assessment Lower Extremity Assessment: Defer to PT evaluation       Communication Communication Communication: Prefers language other than [place];Interpreter utilized(Montagnard Estanislado Spire)   Cognition Arousal/Alertness: Awake/alert Behavior During Therapy: Flat affect Overall Cognitive Status: Impaired/Different from  baseline Area of Impairment: Following commands;Safety/judgement;Problem solving                       Following Commands: Follows one step commands inconsistently(even  with niece and uncle interpreting; did better with vebal and gestural cues but did not follow all of them) Safety/Judgement: Decreased awareness of safety;Decreased awareness of deficits   Problem Solving: Slow processing;Decreased initiation;Difficulty sequencing;Requires verbal cues;Requires tactile cues                Home Living Family/patient expects to be discharged to:: Private residence Living Arrangements: Other (Comment)(neice stays home with small children) Available Help at Discharge: Family;Available 24 hours/day Type of Home: House Home Access: Stairs to enter [employer] of Steps: 3   Home Layout: One level     Bathroom Shower/Tub: Producer, television/film/video: Standard     Home Equipment: None          Prior Functioning/Environment Level of Independence: Independent        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        OT Problem List: Decreased strength;Decreased range of motion;Impaired balance (sitting and/or standing);Impaired sensation;Decreased safety awareness;Decreased cognition;Impaired UE functional use;Decreased coordination;Decreased knowledge of use of DME or AE;Impaired vision/perception;Impaired tone      OT Treatment/Interventions: Self-care/ADL training;Balance training;Therapeutic exercise;Therapeutic activities;DME and/or AE instruction;Visual/perceptual remediation/compensation;Patient/family education    OT Goals(Current goals can be found in the care plan section) Acute Rehab OT Goals Patient Stated Goal: pt unable to state OT Goal Formulation: With family Time For Goal Achievement: 02/28/18 Potential to Achieve Goals: Good  OT Frequency: Min 3X/week           Co-evaluation PT/OT/SLP Co-Evaluation/Treatment: Yes Reason for Co-Treatment: For patient/therapist safety;To address functional/ADL transfers   OT goals addressed during session: Strengthening/ROM      AM-PAC PT "6 Clicks" Daily  Activity     Outcome Measure Help from another person eating meals?: Total Help from another person taking care of personal grooming?: A Lot Help from another person toileting, which includes using toliet, bedpan, or urinal?: A Lot Help from another person bathing (including washing, rinsing, drying)?: A Lot Help from another person to put on and taking off regular upper body clothing?: A Lot Help from another person to put on and taking off regular lower body clothing?: Total 6 Click Score: 10   End of Session Equipment Utilized During Treatment: Gait belt  Activity Tolerance: Patient tolerated treatment well Patient left: in bed;with call bell/phone within reach;with bed alarm set;with family/visitor present  OT Visit Diagnosis: Unsteadiness on feet (R26.81);Other abnormalities of gait and mobility (R26.89);Muscle weakness (generalized) (M62.81);Low vision, both eyes (H54.2);Other symptoms and signs involving cognitive function;Cognitive communication deficit (R41.841) Symptoms and signs involving cognitive functions: Cerebral infarction                Time: 1478-2956 OT Time Calculation (min): 27 min Charges:  OT General Charges $OT Visit: 1 Visit OT Evaluation $OT Eval Moderate Complexity: [address] [place] [phone] 02/14/2018

## 2018-02-14 NOTE — Evaluation (Signed)
Physical Therapy Evaluation Patient Details Name: Jorge Burns MRN: [id number] DOB: Nov 30, 1940 Today's Date: 02/14/2018   History of Present Illness  Jorge Burns is a 77 y.o. male with a known history of CVA in 2017, HTN presents to the emergency department for evaluation of facial and right sided weakness associated with headache and slurred speech which began today. MRI: Multifocal acute ischemia within the left hemisphere,predominantly within the deep watershed zone and left ACA/MCA cortical watershed zone. No hemorrhage or mass effect. Small subacute infarct within the right-sided white matter adjacent to the corpus callosum splenium.  Clinical Impression  Patient presents with decreased independence with mobility due to deficits listed in PT problem list including R inattention, visual tracking to midline only and decreased safety & deficit awareness.  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  He will benefit from CIR level rehab at d/c.     Follow Up Recommendations Supervision/Assistance - 24 hour;CIR    Equipment Recommendations  Other (comment);Rolling walker with 5" wheels(TBA)    Recommendations for Other Services Rehab consult     Precautions / Restrictions Precautions Precautions: Fall Restrictions Weight Bearing Restrictions: No      Mobility  Bed Mobility Overal bed mobility: Needs Assistance Bed Mobility: Supine to Sit     Supine to sit: Min assist;HOB elevated        Transfers Overall transfer level: Needs assistance Equipment used: 2 person hand held assist Transfers: Sit to/from Stand Sit to Stand: +2 physical assistance;Min assist         General transfer comment: assist for safety and R side awarness wtih bilateral HHA  Ambulation/Gait Ambulation/Gait assistance: Min assist;+2 physical assistance Ambulation Distance (Feet): 40 Feet Assistive device: 2 person hand held  assist Gait Pattern/deviations: Step-through pattern;Decreased stride length;Decreased weight shift to right;Drifts right/left     General Gait Details: leads with L side of his body needs assist for heavy R arm and for balance (falls to R and back without UE support)  Stairs            Wheelchair Mobility    Modified Rankin (Stroke Patients Only) Modified Rankin (Stroke Patients Only) Pre-Morbid Rankin Score: No symptoms Modified Rankin: Moderately severe disability     Balance Overall balance assessment: Needs assistance Sitting-balance support: Single extremity supported;Feet supported Sitting balance-Leahy Scale: Fair   Postural control: Posterior lean;Right lateral lean Standing balance support: Bilateral upper extremity supported Standing balance-Leahy Scale: Poor                               Pertinent Vitals/Pain Pain Assessment: Faces Faces Pain Scale: No hurt    Home Living Family/patient expects to be discharged to:: Private residence Living Arrangements: Other (Comment)(neice stays home with small children) Available Help at Discharge: Family;Available 24 hours/day Type of Home: House Home Access: Stairs to enter   [employer] of Steps: 3 Home Layout: One level Home Equipment: None      Prior Function Level of Independence: Independent         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     Hand Dominance   Dominant Hand: Right    Extremity/Trunk Assessment   Upper Extremity Assessment Upper Extremity Assessment: Defer to OT evaluation RUE Deficits / Details: able to raise bend RUE a little at elbow actively but no other movement seent RUE Coordination: decreased  fine motor;decreased gross motor    Lower Extremity Assessment Lower Extremity Assessment: RLE deficits/detail RLE Deficits / Details: lifts antigravity with increased time, holds knee extension to some resistance; does note attend well to R  side RLE Coordination: decreased gross motor       Communication   Communication: Prefers language other than [place];Interpreter utilized(Montagn[employer] Estanislado Spire)  Cognition Arousal/Alertness: Awake/alert Behavior During Therapy: Flat affect Overall Cognitive Status: Impaired/Different from baseline Area of Impairment: Following commands;Safety/judgement;Problem solving                       Following Commands: Follows one step commands inconsistently;Follows one step commands with increased time(increased time for all commands and difficulty following even with gestures and neice and son interpreting) Safety/Judgement: Decreased awareness of safety;Decreased awareness of deficits   Problem Solving: Slow processing;Decreased initiation;Difficulty sequencing;Requires verbal cues;Requires tactile cues        General Comments      Exercises     Assessment/Plan    PT Assessment Patient needs continued PT services  PT Problem List Decreased balance;Decreased knowledge of use of DME;Decreased cognition;Decreased strength;Decreased mobility;Decreased safety awareness       PT Treatment Interventions DME instruction;Functional mobility training;Balance training;Patient/family education;Therapeutic activities;Gait training;Therapeutic exercise;Stair training    PT Goals (Current goals can be found in the Care Plan section)  Acute Rehab PT Goals Patient Stated Goal: pt unable to state, family agreeable to rehab PT Goal Formulation: With family Time For Goal Achievement: 02/28/18    Frequency Min 4X/week   Barriers to discharge        Co-evaluation PT/OT/SLP Co-Evaluation/Treatment: Yes Reason for Co-Treatment: For patient/therapist safety;To address functional/ADL transfers PT goals addressed during session: Mobility/safety with mobility;Balance OT goals addressed during session: Strengthening/ROM       AM-PAC PT "6 Clicks" Daily Activity  Outcome Measure Difficulty  turning over in bed (including adjusting bedclothes, sheets and blankets)?: Unable Difficulty moving from lying on back to sitting on the side of the bed? : Unable Difficulty sitting down on and standing up from a chair with arms (e.g., wheelchair, bedside commode, etc,.)?: Unable Help needed moving to and from a bed to chair (including a wheelchair)?: A Lot Help needed walking in hospital room?: A Lot Help needed climbing 3-5 steps with a railing? : Total 6 Click Score: 8    End of Session Equipment Utilized During Treatment: Gait belt Activity Tolerance: Patient limited by fatigue Patient left: in bed;with call bell/phone within reach;with family/visitor present;with bed alarm set   PT Visit Diagnosis: Other abnormalities of gait and mobility (R26.89);Hemiplegia and hemiparesis Hemiplegia - Right/Left: Right Hemiplegia - dominant/non-dominant: Dominant Hemiplegia - caused by: Cerebral infarction    Time: 1345-1412 PT Time Calculation (min) (ACUTE ONLY): 27 min   Charges:   PT Evaluation $PT Eval Moderate Complexity: 1 Mod     PT G CodesSheran Lawless, [place] [phone] 02/14/2018   Elray Mcgregor 02/14/2018, 4:12 PM

## 2018-02-14 NOTE — Plan of Care (Signed)
Swallow study, patient non verbal and coughing.  Dysphagia 2 nectar thick diet ordered, aspiration precautions.  MD discussed POC with family today, plan for angiogram Tuesday 5/28.

## 2018-02-15 ENCOUNTER — Other Ambulatory Visit: Payer: Self-pay | Admitting: Interventional Radiology

## 2018-02-15 ENCOUNTER — Inpatient Hospital Stay (HOSPITAL_COMMUNITY): Payer: Medicare Other

## 2018-02-15 DIAGNOSIS — Z8673 Personal history of transient ischemic attack (TIA), and cerebral infarction without residual deficits: Secondary | ICD-10-CM

## 2018-02-15 DIAGNOSIS — I10 Essential (primary) hypertension: Secondary | ICD-10-CM

## 2018-02-15 DIAGNOSIS — I5189 Other ill-defined heart diseases: Secondary | ICD-10-CM

## 2018-02-15 DIAGNOSIS — I6300 Cerebral infarction due to thrombosis of unspecified precerebral artery: Secondary | ICD-10-CM

## 2018-02-15 DIAGNOSIS — R7303 Prediabetes: Secondary | ICD-10-CM

## 2018-02-15 HISTORY — PX: IR ANGIO VERTEBRAL SEL VERTEBRAL UNI L MOD SED: IMG5367

## 2018-02-15 HISTORY — PX: IR ANGIO VERTEBRAL SEL SUBCLAVIAN INNOMINATE UNI R MOD SED: IMG5365

## 2018-02-15 HISTORY — PX: IR ANGIO INTRA EXTRACRAN SEL COM CAROTID INNOMINATE BILAT MOD SED: IMG5360

## 2018-02-15 LAB — BASIC METABOLIC PANEL
Anion gap: 4 — ABNORMAL LOW (ref 5–15)
BUN: 7 mg/dL (ref 6–20)
CO2: 23 mmol/L (ref 22–32)
Calcium: 8.5 mg/dL — ABNORMAL LOW (ref 8.9–10.3)
Chloride: 112 mmol/L — ABNORMAL HIGH (ref 101–111)
Creatinine, Ser: 1.07 mg/dL (ref 0.61–1.24)
GFR calc Af Amer: >60 mL/min (ref >60)
GFR calc non Af Amer: >60 mL/min (ref >60)
Glucose, Bld: 95 mg/dL (ref 65–99)
Potassium: 4.2 mmol/L (ref 3.5–5.1)
Sodium: 139 mmol/L (ref 135–145)

## 2018-02-15 MED ORDER — HEPARIN SODIUM (PORCINE) 1000 UNIT/ML IJ SOLN
INTRAMUSCULAR | Status: AC
  Filled 2018-02-15: qty 1

## 2018-02-15 MED ORDER — FENTANYL CITRATE (PF) 100 MCG/2ML IJ SOLN
INTRAMUSCULAR | Status: DC | PRN
  Administered 2018-02-15: 25 ug via INTRAVENOUS

## 2018-02-15 MED ORDER — MIDAZOLAM HCL 2 MG/2ML IJ SOLN
INTRAMUSCULAR | Status: AC
  Filled 2018-02-15: qty 2

## 2018-02-15 MED ORDER — HYDRALAZINE HCL 20 MG/ML IJ SOLN
INTRAMUSCULAR | Status: AC
  Filled 2018-02-15: qty 1

## 2018-02-15 MED ORDER — IOPAMIDOL (ISOVUE-300) INJECTION 61%
INTRAVENOUS | Status: AC
  Filled 2018-02-15: qty 50

## 2018-02-15 MED ORDER — SODIUM CHLORIDE 0.9 % IV SOLN
INTRAVENOUS | Status: DC
  Administered 2018-02-15: 12:00:00 via INTRAVENOUS

## 2018-02-15 MED ORDER — HEPARIN SODIUM (PORCINE) 5000 UNIT/ML IJ SOLN
5000.0000 [IU] | Freq: Three times a day (TID) | INTRAMUSCULAR | Status: DC
  Administered 2018-02-15: 5000 [IU] via SUBCUTANEOUS
  Filled 2018-02-15: qty 1

## 2018-02-15 MED ORDER — LIDOCAINE HCL 1 % IJ SOLN
INTRAMUSCULAR | Status: DC | PRN
  Administered 2018-02-15: 10 mL

## 2018-02-15 MED ORDER — IOHEXOL 300 MG/ML  SOLN
150.0000 mL | Freq: Once | INTRAMUSCULAR | Status: AC | PRN
  Administered 2018-02-15: 85 mL via INTRA_ARTERIAL

## 2018-02-15 MED ORDER — MIDAZOLAM HCL 2 MG/2ML IJ SOLN
INTRAMUSCULAR | Status: DC | PRN
  Administered 2018-02-15: 1 mg via INTRAVENOUS

## 2018-02-15 MED ORDER — HEPARIN SODIUM (PORCINE) 5000 UNIT/ML IJ SOLN
5000.0000 [IU] | Freq: Three times a day (TID) | INTRAMUSCULAR | Status: DC
  Administered 2018-02-16 – 2018-02-18 (×8): 5000 [IU] via SUBCUTANEOUS
  Filled 2018-02-15 (×8): qty 1

## 2018-02-15 MED ORDER — HEPARIN SODIUM (PORCINE) 1000 UNIT/ML IJ SOLN
INTRAMUSCULAR | Status: DC | PRN
  Administered 2018-02-15: 1000 [IU] via INTRAVENOUS

## 2018-02-15 MED ORDER — FENTANYL CITRATE (PF) 100 MCG/2ML IJ SOLN
INTRAMUSCULAR | Status: AC
  Filled 2018-02-15: qty 2

## 2018-02-15 MED ORDER — HYDRALAZINE HCL 20 MG/ML IJ SOLN
INTRAMUSCULAR | Status: DC | PRN
  Administered 2018-02-15 (×3): 5 mg via INTRAVENOUS

## 2018-02-15 MED ORDER — LIDOCAINE HCL 1 % IJ SOLN
INTRAMUSCULAR | Status: AC
  Filled 2018-02-15: qty 20

## 2018-02-15 NOTE — Sedation Documentation (Signed)
Difficult obtaining CO2 d/t patient mouth breathing.

## 2018-02-15 NOTE — Consult Note (Signed)
Physical Medicine and Rehabilitation Consult Reason for Consult: Right side weakness and slurred speech Referring Physician: [facility]   HPI: Jorge Burns is a 77 y.o. 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  Per chart review patient lives with niece.  One level home 3 steps to entry.  Independent prior to admission and working.  Presented 02/13/2018 with headache, right-sided weakness and slurred speech.  Cranial CT reviewed, unremarkable for acute intracranial process. Per report, old right parietal infarct.  Patient did not receive TPA.  MRI showed multifocal acute ischemia within the left hemisphere, predominantly within the deep watershed zone and left ACA/MCA.  Small subacute infarct within the right side white matter adjacent to the corpus callosum.  MRA of the head with no intracranial occlusion or high-grade stenosis.  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  CTA of head and neck showed 50% right ICA with a 3 to 4 mm posterior projecting ulceration.  Critical stenosis left ICA.  Interventional radiology consulted for cerebral angiogram.  Echocardiogram with ejection fraction of 55% grade 1 diastolic dysfunction.  EEG was negative.  Presently on aspirin and Plavix for CVA prophylaxis.  Physical and occupational therapy evaluation completed with recommendations of physical medicine rehab consult.  Review of Systems  Unable to perform ROS: Language   Past Medical History:  Diagnosis Date  . Stroke [facility])    "a long time ago -- 2 years ago"   No past surgical history on file., unable to obtain from patient No family history on file., unable to obtain from patient Social History:  reports that he has never smoked. He has never used smokeless tobacco. He reports that he does not drink alcohol or use drugs. Allergies: No Known Allergies Medications Prior to Admission  Medication Sig Dispense Refill  .  butalbital-acetaminophen-caffeine (FIORICET, ESGIC) 50-325-40 MG tablet Take 1-2 tablets by mouth every 8 (eight) hours as needed for headache. 15 tablet 0    Home: Home Living Family/patient expects to be discharged to:: Private residence Living Arrangements: Other (Comment)(neice stays home with small children) Available Help at Discharge: Family, Available 24 hours/day Type of Home: House Home Access: Stairs to enter [employer] of Steps: 3 Home Layout: One level Bathroom Shower/Tub: Health visitor: Standard Home Equipment: None  Functional History: Prior Function Level of Independence: Independent 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 Functional Status:  Mobility: Bed Mobility Overal bed mobility: Needs Assistance Bed Mobility: Supine to Sit Supine to sit: Min assist, HOB elevated Transfers Overall transfer level: Needs assistance Equipment used: 2 person hand held assist Transfers: Sit to/from Stand Sit to Stand: +2 physical assistance, Min assist General transfer comment: assist for safety and R side awarness wtih bilateral HHA Ambulation/Gait Ambulation/Gait assistance: Min assist, +2 physical assistance Ambulation Distance (Feet): 40 Feet Assistive device: 2 person hand held assist Gait Pattern/deviations: Step-through pattern, Decreased stride length, Decreased weight shift to right, Drifts right/left General Gait Details: leads with L side of his body needs assist for heavy R arm and for balance (falls to R and back without UE support)    ADL: ADL Overall ADL's : Needs assistance/impaired Eating/Feeding: NPO Grooming: Moderate assistance, Sitting Upper Body Bathing: Moderate assistance, Sitting Lower Body Bathing: Maximal assistance Lower Body Bathing Details (indicate cue type and reason): min A +2 sit<>stand Upper Body Dressing : Maximal assistance, Sitting Lower Body Dressing: Total assistance Lower Body Dressing  Details (indicate cue type and reason): min A +2 sit<>stand Toilet Transfer: Minimal assistance, +2 for physical assistance, Ambulation Toilet Transfer Details (indicate cue type and reason): Bil HHA Toileting- Clothing Manipulation and Hygiene: Total assistance Toileting - Clothing Manipulation Details (indicate cue type and reason): min A +2 sit<>stand  Cognition: Cognition Overall Cognitive Status: Impaired/Different from baseline Orientation Level: Other (comment)(per relative oriented to self) Cognition Arousal/Alertness: Awake/alert Behavior During Therapy: Flat affect Overall Cognitive Status: Impaired/Different from baseline Area of Impairment: Following commands, Safety/judgement, Problem solving Following Commands: Follows one step commands inconsistently, Follows one step commands with increased time(increased time for all commands and difficulty following even with gestures and neice and son interpreting) Safety/Judgement: Decreased awareness of safety, Decreased awareness of deficits Problem Solving: Slow processing, Decreased initiation, Difficulty sequencing, Requires verbal cues, Requires tactile cues  Blood pressure (!) 159/94, pulse (!) 58, temperature 97.8 F (36.6 C), temperature source Oral, resp. rate 16, height  (1.676 m), weight 57.2 kg (126 lb 1.7 oz), SpO2 96 %. Physical Exam  Vitals reviewed. Constitutional: He appears well-developed and well-nourished.  HENT:  Head: Normocephalic and atraumatic.  Eyes: EOM are normal. Right eye exhibits no discharge. Left eye exhibits no discharge.  Pupils reactive to light  Neck: Normal range of motion. Neck supple. No thyromegaly present.  Cardiovascular: Normal rate and regular rhythm.  Respiratory: Effort normal and breath sounds normal. No respiratory distress.  GI: Soft. Bowel sounds are normal. He exhibits no distension.  Musculoskeletal:  No edema or tenderness in extremities  Neurological: He is alert.    Non-English-speaking.   Exam overall limited due to language barrier +/- aphasia Unable to follow verbal or demonstrated commands Spontaneously moving LUE/LLE No movement notes on right side  Skin: Skin is warm and dry.  Psychiatric:  Unable to assess due to languate +/- aphasia    Results for orders placed or performed during the hospital encounter of 02/12/18 (from the past 24 hour(s))  Comprehensive metabolic panel     Status: Abnormal   Collection Time: 02/14/18  8:42 AM  Result Value Ref Range   Sodium 140 135 - 145 mmol/L   Potassium 4.1 3.5 - 5.1 mmol/L   Chloride 111 101 - 111 mmol/L   CO2 23 22 - 32 mmol/L   Glucose, Bld 94 65 - 99 mg/dL   BUN 9 6 - 20 mg/dL   Creatinine, Ser 4.09 0.61 - 1.24 mg/dL   Calcium 8.5 (L) 8.9 - 10.3 mg/dL   Total Protein 6.7 6.5 - 8.1 g/dL   Albumin 3.2 (L) 3.5 - 5.0 g/dL   AST 16 15 - 41 U/L   ALT 14 (L) 17 - 63 U/L   Alkaline Phosphatase 59 38 - 126 U/L   Total Bilirubin 0.6 0.3 - 1.2 mg/dL   GFR calc non Af Amer >60 >60 mL/min   GFR calc Af Amer >60 >60 mL/min   Anion gap 6 5 - 15  CBC     Status: Abnormal   Collection Time: 02/14/18  8:42 AM  Result Value Ref Range   WBC 6.9 4.0 - 10.5 K/uL   RBC 5.42 4.22 - 5.81 MIL/uL   Hemoglobin 13.2 13.0 - 17.0 g/dL   HCT 81.1 91.4 - 78.2 %   MCV 74.5 (L) 78.0 - 100.0 fL   MCH 24.4 (L) 26.0 - 34.0 pg   MCHC 32.7 30.0 - 36.0 g/dL   RDW 95.6 (H) 21.3 - 08.6 %   Platelets 247 150 -  400 K/uL   Dg Chest 2 View  Result Date: 02/13/2018 CLINICAL DATA:  TIA. EXAM: CHEST - 2 VIEW COMPARISON:  12/08/2015. FINDINGS: Mild cardiac enlargement. Aortic atherosclerotic calcifications noted. There is scarring and volume loss involving the left lung. Unchanged from comparison exam. No superimposed airspace consolidation, pulmonary edema, or pleural effusion. Pleuroparenchymal scarring and calcification overlying the left apex is similar to previous exam. IMPRESSION: 1. No acute cardiopulmonary abnormalities.  2.  Aortic Atherosclerosis (ICD10-I70.0). Electronically Signed   By: Signa Kell M.D.   On: 02/13/2018 09:00   Ct Angio Neck W Or Wo Contrast  Result Date: 02/13/2018 CLINICAL DATA:  Akinetic mediastinum. RIGHT facial weakness. RIGHT upper extremity weakness EXAM: CT ANGIOGRAPHY NECK TECHNIQUE: Multidetector CT imaging of the neck was performed using the standard protocol during bolus administration of intravenous contrast. Multiplanar CT image reconstructions and MIPs were obtained to evaluate the vascular anatomy. Carotid stenosis measurements (when applicable) are obtained utilizing NASCET criteria, using the distal internal carotid diameter as the denominator. CONTRAST:  50mL ISOVUE-370 IOPAMIDOL (ISOVUE-370) INJECTION 76% COMPARISON:  MRI brain earlier today demonstrates multiple LEFT-sided infarcts FINDINGS: Aortic arch: Standard branching. Imaged portion shows no evidence of aneurysm or dissection. No significant stenosis of the major arch vessel origins. Aortic atherosclerosis with calcification and intimal plaque. Right carotid system: Heavily calcified plaque begins in the distal common carotid artery just before the bifurcation. There is calcified and noncalcified plaque at the origin of the RIGHT internal carotid artery. Suspected 3-4 mm posterior ulceration. 50% stenosis of the proximal RIGHT ICA, based on luminal measurements of 2.0/3.9 proximal/distal. No dissection. Left carotid system: Calcific and noncalcified plaque at the origin of the LEFT ICA. There is a critical stenosis just above the bifurcation, with a lumen too small to reliably measure, less than 1 mm diameter as seen on series 7, image 96. There is slightly reduced caliber, at least compared to the RIGHT side, of the LEFT cervical ICA, 3 mm diameter, further suggesting a proximal stenosis of hemodynamic significance. No dissection. Vertebral arteries: BILATERAL patent. LEFT dominant. No ostial narrowing of significance. Skeleton:  Cervical spondylosis.  No worrisome osseous lesion. Other neck: No airway narrowing. 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 Thyroid sonography recommended. Poor dentition. Upper chest: There is scarring and volume loss in the LEFT hemithorax. There is a possible fungus ball, 11 x 11 mm within a LEFT apical cavity. Pneumomediastinum is present of uncertain etiology. RIGHT apical scarring. 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 This could serve as a source of distal emboli. Critical stenosis LEFT ICA. Diminished caliber of the distal cervical segment. Surgical and/or neuro interventional consultation may be warranted. Findings discussed with neurology PA. LEFT apical scarring, possible fungus ball, along with pneumomediastinum of uncertain significance. Chronic or acute tuberculous infection cannot completely be excluded. Formal chest CT is warranted for further evaluation. Electronically Signed   By: Elsie Stain M.D.   On: 02/13/2018 17:04    Assessment/Plan: Diagnosis: Multifocal left hemisphere infacrt Labs and images independently reviewed.  Records reviewed and summated above. Stroke: Continue secondary stroke prophylaxis and Risk Factor Modification listed below:   Antiplatelet therapy:   Blood Pressure Management:  Continue current medication with prn's with permisive HTN per primary team Statin Agent:   Prediabetes management:   Right sided hemiparesis: fit for orthosis to prevent contractures (resting hand splint for day, wrist cock up splint at night, PRAFO, etc) Motor recovery: Fluoxetine  1. Does the need for close, 24 hr/day medical supervision in concert with the patient's rehab needs make it unreasonable for this patient to be served in a less intensive setting? Potentially  2. Co-Morbidities requiring supervision/potential complications: diastolic dysfunction (monitor for signs/symptoms of fluid overload), history of  CVA (start meds), HTN (monitor and provide prns in accordance with increased physical exertion and pain), prediabetes (Monitor in accordance with exercise and adjust meds as necessary) 3. Due to safety, disease management, medication administration and patient education, does the patient require 24 hr/day rehab nursing? Potentially 4. Does the patient require coordinated care of a physician, rehab nurse, PT (1-2 hrs/day, 5 days/week), OT (1-2 hrs/day, 5 days/week) and SLP (1-2 hrs/day, 5 days/week) to address physical and functional deficits in the context of the above medical diagnosis(es)? Potentially Addressing deficits in the following areas: balance, endurance, locomotion, strength, transferring, bathing, dressing, toileting, cognition, speech, language, swallowing and psychosocial support 5. Can the patient actively participate in an intensive therapy program of at least 3 hrs of therapy per day at least 5 days per week? Yes 6. The potential for patient to make measurable gains while on inpatient rehab is good 7. Anticipated functional outcomes upon discharge from inpatient rehab are supervision and min assist  with PT, supervision and min assist with OT, min assist with SLP. 8. Estimated rehab length of stay to reach the above functional goals is: ?7-10 days. 9. Anticipated D/C setting: TBD 10. Anticipated post D/C treatments: HH therapy and Home excercise program 11. Overall Rehab/Functional Prognosis: good  RECOMMENDATIONS: This patient's condition is appropriate for continued rehabilitative care in the following setting: Will need to inquire about discharge disposition.  Will consider CIR if patient does not progress with therapies after completion of medical workup. Patient has agreed to participate in recommended program. Potentially Note that insurance prior authorization may be required for reimbursement for recommended care.  Comment: Rehab Admissions Coordinator to follow up.   I  have personally performed a face to face diagnostic evaluation, including, but not limited to relevant history and physical exam findings, of this patient and developed relevant assessment and plan.  Additionally, I have reviewed and concur with the physician assistant's documentation above.   Maryla Morrow, MD, ABPMR [facility]arthur Rossetti Angiulli, PA-C 02/15/2018

## 2018-02-15 NOTE — Procedures (Signed)
S/P 4 vessel cerebral arteriogram. RT CFA approach. Findings. 1.95 % plus LT ICA prox stenosis with slow antegrade flow. 2.Approx 35 % stenosis of RT ICA prox with a 5mm smoth ulcerated plaque 3.approx 40 % stenosis of LT VA origin. 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

## 2018-02-15 NOTE — Sedation Documentation (Signed)
Patient is resting comfortably. 

## 2018-02-15 NOTE — Sedation Documentation (Signed)
BedRest to start at 1215.

## 2018-02-15 NOTE — Progress Notes (Signed)
SLP Cancellation Note  Patient Details Name: Jorge Burns MRN: [id number] DOB: [birth year]   Cancelled treatment:       Reason Eval/Treat Not Completed: Medical issues which prohibited therapy. Pt is currently NPO for procedure today. Unable to assess tolerance of Dys2/nectar thick liquid diet. Per chart, pt is on room air, he is afebrile, lungs are clear. ST will continue efforts. RN aware.  [name] B. Murvin [facility], CCC-SLP Speech Language Pathologist [phone]  Leigh Aurora 02/15/2018, 9:41 AM

## 2018-02-15 NOTE — Sedation Documentation (Signed)
IR tech holding pressure at this time. 

## 2018-02-15 NOTE — Progress Notes (Signed)
Patient Demographics:    Jorge Burns, is a 77 y.o. male, DOB - 11-Dec-1940, UEA:[id number]  Admit date - 02/12/2018   Admitting Physician Tonye Royalty, DO  Outpatient Primary MD for the patient is Patient, No Pcp Per  LOS - 2   Chief Complaint  Patient presents with  . Stroke Symptoms        Subjective:    [name] today has no fevers, no emesis,  No chest pain, patient's caregiver [name] and [name] who are English speaking available   Assessment  & Plan :    Active Problems:   CVA (cerebral vascular accident) (HCC)   Diastolic dysfunction   Prediabetes  02/13/18 CTA neck IMPRESSION: 50% stenosis RIGHT ICA, with a 3-4 mm posterior projecting ulceration. This could serve as a source of distal emboli. Critical stenosis LEFT ICA. Diminished caliber of the distal cervical segment.  02/13/18 MRI Brain 1. Multifocal acute ischemia within the left hemisphere, predominantly within the deep watershed zone and left ACA/MCA cortical watershed zone. No hemorrhage or mass effect. 2. Small subacute infarct within the right-sided white matter adjacent to the corpus callosum splenium.   Cerebral catheter angiogram 02/15/18 : . 1)95 % plus LT ICA prox stenosis with slow antegrade flow. 2)Approx 35 % stenosis of RT ICA prox with a 5mm smoth ulcerated plaque 3)Approx 40 % stenosis of LT VA origin. 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  As per history obtained from patient's caregiver [name] and [name] who are both speak [place] and [place] patient's symptoms including headache, visual disturbance weakness and gait disturbance started on Thursday, 02/10/2018.  Patient was subsequently seen in the ED on Friday,  02/11/2018 initial CT head was without acute findings, patient was told he may have a migraine headache and discharged from the ED according to family.  Family brought patient back on 02/12/2018 due to persistent neuro symptoms.  Admitted 02/13/2018 as noted above with acute left ACA/MCA strokes with aphasia, right-sided weakness, dysphagia and cognitive concerns.  Cerebral angiogram on 02/15/2018 with 95% left ICA proximal stenosis, awaiting vascular surgery input.     Plan:- 1)Acute CVA/Left ACA/MCA strokes - , cerebral angiogram from 02/15/2018 as noted above with 95% left ICA proximal stenosis, awaiting vascular surgery input. ,  continue to monitor on telemetry monitored unit , neurology input appreciated, continue aspirin with Plavix and Lipitor, PTA despite history of prior stroke patient was not on aspirin or statin.   aphasia, right-sided weakness especially right upper extremity, dysphagia and cognitive concerns persist, right-sided hemiparesis slightly improving,  echocardiogram with preserved EF, no significant wall motion normalities or intracardiac thrombus,,  CTA Neck with critical left ICA stenosis,  discussed with  neuro interventional radiologist--Dr. - T. Deveshwar   (pager [phone]), continue to  allow some permissive hypertension in view of  acute stroke and critical left ICA stenosis, avoid precipitous drop in blood pressure. Use Hydralazine  or Labetalol 10 mg iv every 4 hrs as needed for systolic blood pressure over 220 mmhg , , A1c is only  5.7 however LDL is 151, LDL goal should be less than 70.   2)Diet/FEN/Dysphagia--- in the setting of acute stroke, speech pathologist eval appreciated, recommend dysphagia 2 diet with nectar thickened liquids, give IV fluids given all the contrast load patient has received in the setting of poor oral intake to limit risk of contrast-induced nephropathy  3)Social/Ethics-plan of care discussed with patient's caregiver [name] (Nephew's wife) by  speaker phone and [name] (nephew) at bedside who are [place] speaking available , questions answered, patient is a full code  4)LEFT apical scarring, possible fungus ball, along with pneumomediastinum of uncertain significance--- CTA neck from 02/13/2018 with findings noted above, as per family members no prior history of tuberculosis.  Discussed with radiologist who advises CTA chest to be done after cerebral arteriogram and stroke work-is completed.  Patient has had significant contrast load since admission, we will  Hydrate and  follow creatinine and do CTA chest over the next few days if renal function allows and patient remaines stable  Code Status : full  Disposition Plan  : TBD, ?? CIR  Consults  : Neurology/neuro interventional radiology/speech pathologist/PT OT  DVT Prophylaxis  : Subcu heparin  Lab Results  Component Value Date   PLT 247 02/14/2018    Inpatient Medications  Scheduled Meds: . aspirin  81 mg Oral Daily  . atorvastatin  80 mg Oral q1800  . clopidogrel  75 mg Oral Daily  . fentaNYL      . glycopyrrolate  0.2 mg Intravenous BID  . heparin      . hydrALAZINE      . lidocaine      . midazolam       Continuous Infusions: . sodium chloride 150 mL/hr at 02/15/18 1336  . sodium chloride 75 mL/hr at 02/15/18 1206   PRN Meds:.acetaminophen **OR** acetaminophen (TYLENOL) oral liquid 160 mg/5 mL **OR** acetaminophen, fentaNYL, heparin, hydrALAZINE, hydrALAZINE, lidocaine, midazolam, senna-docusate    Anti-infectives (From admission, onward)   None        Objective:   Vitals:   02/15/18 1150 02/15/18 1200 02/15/18 1215 02/15/18 1457  BP: (!) 163/96 (!) 159/85 (!) 156/84 (!) 153/82  Pulse: 93 88 88 81  Resp: 14 (!) 54 16 16  Temp:    97.6 F (36.4 C)  TempSrc:    Oral  SpO2: 98% 98% 99% 99%  Weight:      Height:        Wt Readings from Last 3 Encounters:  02/13/18 57.2 kg (126 lb 1.7 oz)  02/11/18 63.5 kg (140 lb)  02/19/16 71.7 kg (158 lb)      Intake/Output Summary (Last 24 hours) at 02/15/2018 1522 Last data filed at 02/15/2018 0318 Gross per 24 hour  Intake 380 ml  Output 2000 ml  Net -1620 ml     Physical Exam  Gen:- Awake , less lethargic, able to follow simple commands  HEENT:- [place].AT, No sclera icterus Neck-Supple Neck,No JVD,.  Lungs-  CTAB good air movement CV- S1, S2 normal Abd-  +ve B.Sounds, Abd Soft, No tenderness,    Extremity/Skin:- No  edema,   good pulses Psych-affect is appropriate,  Neuro-Neurologial Exam: Mental Status: Patient is awake, limited exam due to aphasia , no significant neglect , Cranial Nerves: II: Visual Fields are full. Pupils are equal, round, and reactive to light.   III,IV, VI: EOMI without ptosis  V: Facial asymmetry noted VII: Facial asymmetry noted.  VIII: hearing is intact to voice X: Uvula elevates  symmetrically XI: Shoulder shrug is symmetric. XII: tongue is midline without atrophy or fasciculations.  Motor: Tone is normal. Bulk is normal.  Right-sided hemiparesis especially right upper extremity.  Sensory: Difficult to evaluate due to aphasia and language barrier Cerebellar: Gait was not tested unable to elicit presence or absence of ataxia     Data Review:   Micro Results No results found for this or any previous visit (from the past 240 hour(s)).  Radiology Reports Dg Chest 2 View  Result Date: 02/13/2018 CLINICAL DATA:  TIA. EXAM: CHEST - 2 VIEW COMPARISON:  12/08/2015. FINDINGS: Mild cardiac enlargement. Aortic atherosclerotic calcifications noted. There is scarring and volume loss involving the left lung. Unchanged from comparison exam. No superimposed airspace consolidation, pulmonary edema, or pleural effusion. Pleuroparenchymal scarring and calcification overlying the left apex is similar to previous exam. IMPRESSION: 1. No acute cardiopulmonary abnormalities. 2.  Aortic Atherosclerosis (ICD10-I70.0). Electronically Signed   By: Signa Kell M.D.    On: 02/13/2018 09:00   Dg Abd 1 View  Result Date: 02/13/2018 CLINICAL DATA:  Screening for metal prior to MRI. EXAM: ABDOMEN - 1 VIEW COMPARISON:  None. FINDINGS: Metallic fragments are demonstrated in the soft tissues over the right proximal femur and in the left upper quadrant overlying the twelfth distal rib. Scattered gas and stool throughout the colon. No small or large bowel distention. No radiopaque stones. Degenerative changes in the spine. Soft tissue contours appear intact. IMPRESSION: Metallic foreign bodies are demonstrated in the soft tissues over the right hip and in the left upper quadrant. Electronically Signed   By: Burman Nieves M.D.   On: 02/13/2018 04:56   Ct Head Wo Contrast  Result Date: 02/12/2018 CLINICAL DATA:  On Thursday, patient was evaluated for speech difficulty, left-sided weakness, and left eye visual changes. Now today patient presents with right-sided facial droop, abnormal speech, and slurring. EXAM: CT HEAD WITHOUT CONTRAST TECHNIQUE: Contiguous axial images were obtained from the base of the skull through the vertex without intravenous contrast. COMPARISON:  02/11/2018 FINDINGS: Brain: Mild diffuse cerebral atrophy. Focal area of encephalomalacia involving the right anterior parietal lobe consistent with old infarct. Patchy low-attenuation changes in the deep white matter consistent with small vessel ischemia. Old lacunar infarct in the left caudate. No mass-effect or midline shift. No abnormal extra-axial fluid collections. Gray-white matter junctions are distinct. Basal cisterns are not effaced. No acute intracranial hemorrhage. Vascular: Intracranial arterial vascular calcifications are present. Skull: Calvarium appears intact. Sinuses/Orbits: Mucosal thickening in the paranasal sinuses. No acute air-fluid levels. Hypoaeration of the right mastoid air cells. Other: None. IMPRESSION: No acute intracranial abnormalities. Chronic atrophy and small vessel ischemic  changes. Old right parietal infarct. Electronically Signed   By: Burman Nieves M.D.   On: 02/12/2018 23:57   Ct Head Wo Contrast  Result Date: 02/11/2018 CLINICAL DATA:  Headache and dizziness with blurry vision EXAM: CT HEAD WITHOUT CONTRAST TECHNIQUE: Contiguous axial images were obtained from the base of the skull through the vertex without intravenous contrast. COMPARISON:  11/28/2015 head CT FINDINGS: Brain: No acute territorial infarction, hemorrhage or intracranial mass. Encephalomalacia in the right temporal lobe as before. Probable tiny focus of encephalomalacia in the high right parietal lobe, probably unchanged compared with 2017 comparison head CT. Mild atrophy. Stable ventricle size with mild ex vacuo dilatation of right lateral ventricle. Vascular: No hyperdense vessels.  Carotid vascular calcification Skull: Normal. Negative for fracture or focal lesion. Sinuses/Orbits: Mucosal thickening in the ethmoid sinuses. No acute orbital abnormality. Other:  None IMPRESSION: 1. No definite CT evidence for acute intracranial abnormality. 2. Atrophy and right temporal lobe encephalomalacia. Electronically Signed   By: Jasmine Pang M.D.   On: 02/11/2018 20:05   Ct Angio Neck W Or Wo Contrast  Result Date: 02/13/2018 CLINICAL DATA:  Akinetic mediastinum. RIGHT facial weakness. RIGHT upper extremity weakness EXAM: CT ANGIOGRAPHY NECK TECHNIQUE: Multidetector CT imaging of the neck was performed using the standard protocol during bolus administration of intravenous contrast. Multiplanar CT image reconstructions and MIPs were obtained to evaluate the vascular anatomy. Carotid stenosis measurements (when applicable) are obtained utilizing NASCET criteria, using the distal internal carotid diameter as the denominator. CONTRAST:  50mL ISOVUE-370 IOPAMIDOL (ISOVUE-370) INJECTION 76% COMPARISON:  MRI brain earlier today demonstrates multiple LEFT-sided infarcts FINDINGS: Aortic arch: Standard branching. Imaged  portion shows no evidence of aneurysm or dissection. No significant stenosis of the major arch vessel origins. Aortic atherosclerosis with calcification and intimal plaque. Right carotid system: Heavily calcified plaque begins in the distal common carotid artery just before the bifurcation. There is calcified and noncalcified plaque at the origin of the RIGHT internal carotid artery. Suspected 3-4 mm posterior ulceration. 50% stenosis of the proximal RIGHT ICA, based on luminal measurements of 2.0/3.9 proximal/distal. No dissection. Left carotid system: Calcific and noncalcified plaque at the origin of the LEFT ICA. There is a critical stenosis just above the bifurcation, with a lumen too small to reliably measure, less than 1 mm diameter as seen on series 7, image 96. There is slightly reduced caliber, at least compared to the RIGHT side, of the LEFT cervical ICA, 3 mm diameter, further suggesting a proximal stenosis of hemodynamic significance. No dissection. Vertebral arteries: BILATERAL patent. LEFT dominant. No ostial narrowing of significance. Skeleton: Cervical spondylosis.  No worrisome osseous lesion. Other neck: No airway narrowing. 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 Thyroid sonography recommended. Poor dentition. Upper chest: There is scarring and volume loss in the LEFT hemithorax. There is a possible fungus ball, 11 x 11 mm within a LEFT apical cavity. Pneumomediastinum is present of uncertain etiology. RIGHT apical scarring. 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 This could serve as a source of distal emboli. Critical stenosis LEFT ICA. Diminished caliber of the distal cervical segment. Surgical and/or neuro interventional consultation may be warranted. Findings discussed with neurology PA. LEFT apical scarring, possible fungus ball, along with pneumomediastinum of uncertain significance. Chronic or acute tuberculous infection  cannot completely be excluded. Formal chest CT is warranted for further evaluation. Electronically Signed   By: Elsie Stain M.D.   On: 02/13/2018 17:04   Mr Angiogram Head Wo Contrast  Result Date: 02/13/2018 CLINICAL DATA:  Acute onset weakness EXAM: MR HEAD WITHOUT CONTRAST MR CIRCLE OF WILLIS WITHOUT CONTRAST MRA OF THE NECK WITHOUT AND WITH CONTRAST TECHNIQUE: Multiplanar, multiecho pulse sequences of the brain, circle of willis and surrounding structures were obtained without intravenous contrast. Angiographic images of the neck were obtained using MRA technique without and with intravenous contrast. CONTRAST:  12 mL gadobenate dimeglumine (MULTIHANCE) injection COMPARISON:  Head CT 02/12/2018 FINDINGS: MRI HEAD FINDINGS Brain: The midline structures are normal. There are numerous foci of abnormal diffusion restriction within the left hemisphere, predominantly within the cortical and deep watershed zones. There is a focus mildly hyperintense DWI signal at the right aspect of the corpus callosum splenium without corresponding ADC abnormality, likely a subacute infarct. There is no midline shift or mass effect. Old  right parietal and temporal infarcts. Multifocal hyperintense T2-weighted signal in the white matter compatible with chronic ischemic microangiopathy. No mass lesion. No chronic microhemorrhage or cerebral amyloid angiopathy. No hydrocephalus, age advanced atrophy or lobar predominant volume loss. No dural abnormality or extra-axial collection. Skull and upper cervical spine: The visualized skull base, calvarium, upper cervical spine and extracranial soft tissues are normal. Sinuses/Orbits: Right mastoid effusion normal orbits. MRA HEAD FINDINGS Intracranial internal carotid arteries: Normal. Anterior cerebral arteries: Normal. Middle cerebral arteries: Normal. Posterior communicating arteries: Present on the right. Posterior cerebral arteries: Normal. Basilar artery: Normal. Vertebral arteries:  Left dominant. Normal. Superior cerebellar arteries: Normal. Anterior inferior cerebellar arteries: Normal. Posterior inferior cerebellar arteries: Normal. MRA NECK FINDINGS Aortic arch: Normal 3 vessel aortic branching pattern. The visualized subclavian arteries are normal. Right carotid system: There is a small rounded focus measuring 4 x 3 mm projecting posteriorly from the proximal right internal carotid artery. There is no hemodynamically significant right carotid stenosis. Left carotid system: There is complete loss of contrast enhancement within the proximal left internal carotid artery over a short segment. The remainder of the left internal carotid artery is normal. Vertebral arteries: Left dominant. Vertebral artery origins are normal. Vertebral arteries are normal in course and caliber to the vertebrobasilar confluence without stenosis or evidence of dissection. IMPRESSION: 1. Multifocal acute ischemia within the left hemisphere, predominantly within the deep watershed zone and left ACA/MCA cortical watershed zone. No hemorrhage or mass effect. 2. Small subacute infarct within the right-sided white matter adjacent to the corpus callosum splenium. 3. No intracranial occlusion or high-grade stenosis. 4. Loss of enhancement of the proximal left internal carotid artery over a short segment with near-immediate reconstitution. This indicates either critical stenosis or occlusion. 5. 4 x 3 mm rounded focus projecting posteriorly from the proximal right internal carotid artery, most consistent with a small aneurysm. This might be artifactual secondary to the presence of atherosclerotic plaque at the bifurcation. If clinically warranted, this could be further characterized with CTA of the neck. Electronically Signed   By: Deatra Robinson M.D.   On: 02/13/2018 06:21   Mr Angiogram Neck W Or Wo Contrast  Result Date: 02/13/2018 CLINICAL DATA:  Acute onset weakness EXAM: MR HEAD WITHOUT CONTRAST MR CIRCLE OF WILLIS  WITHOUT CONTRAST MRA OF THE NECK WITHOUT AND WITH CONTRAST TECHNIQUE: Multiplanar, multiecho pulse sequences of the brain, circle of willis and surrounding structures were obtained without intravenous contrast. Angiographic images of the neck were obtained using MRA technique without and with intravenous contrast. CONTRAST:  12 mL gadobenate dimeglumine (MULTIHANCE) injection COMPARISON:  Head CT 02/12/2018 FINDINGS: MRI HEAD FINDINGS Brain: The midline structures are normal. There are numerous foci of abnormal diffusion restriction within the left hemisphere, predominantly within the cortical and deep watershed zones. There is a focus mildly hyperintense DWI signal at the right aspect of the corpus callosum splenium without corresponding ADC abnormality, likely a subacute infarct. There is no midline shift or mass effect. Old right parietal and temporal infarcts. Multifocal hyperintense T2-weighted signal in the white matter compatible with chronic ischemic microangiopathy. No mass lesion. No chronic microhemorrhage or cerebral amyloid angiopathy. No hydrocephalus, age advanced atrophy or lobar predominant volume loss. No dural abnormality or extra-axial collection. Skull and upper cervical spine: The visualized skull base, calvarium, upper cervical spine and extracranial soft tissues are normal. Sinuses/Orbits: Right mastoid effusion normal orbits. MRA HEAD FINDINGS Intracranial internal carotid arteries: Normal. Anterior cerebral arteries: Normal. Middle cerebral arteries: Normal. Posterior communicating arteries: Present  on the right. Posterior cerebral arteries: Normal. Basilar artery: Normal. Vertebral arteries: Left dominant. Normal. Superior cerebellar arteries: Normal. Anterior inferior cerebellar arteries: Normal. Posterior inferior cerebellar arteries: Normal. MRA NECK FINDINGS Aortic arch: Normal 3 vessel aortic branching pattern. The visualized subclavian arteries are normal. Right carotid system: There  is a small rounded focus measuring 4 x 3 mm projecting posteriorly from the proximal right internal carotid artery. There is no hemodynamically significant right carotid stenosis. Left carotid system: There is complete loss of contrast enhancement within the proximal left internal carotid artery over a short segment. The remainder of the left internal carotid artery is normal. Vertebral arteries: Left dominant. Vertebral artery origins are normal. Vertebral arteries are normal in course and caliber to the vertebrobasilar confluence without stenosis or evidence of dissection. IMPRESSION: 1. Multifocal acute ischemia within the left hemisphere, predominantly within the deep watershed zone and left ACA/MCA cortical watershed zone. No hemorrhage or mass effect. 2. Small subacute infarct within the right-sided white matter adjacent to the corpus callosum splenium. 3. No intracranial occlusion or high-grade stenosis. 4. Loss of enhancement of the proximal left internal carotid artery over a short segment with near-immediate reconstitution. This indicates either critical stenosis or occlusion. 5. 4 x 3 mm rounded focus projecting posteriorly from the proximal right internal carotid artery, most consistent with a small aneurysm. This might be artifactual secondary to the presence of atherosclerotic plaque at the bifurcation. If clinically warranted, this could be further characterized with CTA of the neck. Electronically Signed   By: Deatra Robinson M.D.   On: 02/13/2018 06:21   Mr Brain Wo Contrast  Result Date: 02/13/2018 CLINICAL DATA:  Acute onset weakness EXAM: MR HEAD WITHOUT CONTRAST MR CIRCLE OF WILLIS WITHOUT CONTRAST MRA OF THE NECK WITHOUT AND WITH CONTRAST TECHNIQUE: Multiplanar, multiecho pulse sequences of the brain, circle of willis and surrounding structures were obtained without intravenous contrast. Angiographic images of the neck were obtained using MRA technique without and with intravenous contrast.  CONTRAST:  12 mL gadobenate dimeglumine (MULTIHANCE) injection COMPARISON:  Head CT 02/12/2018 FINDINGS: MRI HEAD FINDINGS Brain: The midline structures are normal. There are numerous foci of abnormal diffusion restriction within the left hemisphere, predominantly within the cortical and deep watershed zones. There is a focus mildly hyperintense DWI signal at the right aspect of the corpus callosum splenium without corresponding ADC abnormality, likely a subacute infarct. There is no midline shift or mass effect. Old right parietal and temporal infarcts. Multifocal hyperintense T2-weighted signal in the white matter compatible with chronic ischemic microangiopathy. No mass lesion. No chronic microhemorrhage or cerebral amyloid angiopathy. No hydrocephalus, age advanced atrophy or lobar predominant volume loss. No dural abnormality or extra-axial collection. Skull and upper cervical spine: The visualized skull base, calvarium, upper cervical spine and extracranial soft tissues are normal. Sinuses/Orbits: Right mastoid effusion normal orbits. MRA HEAD FINDINGS Intracranial internal carotid arteries: Normal. Anterior cerebral arteries: Normal. Middle cerebral arteries: Normal. Posterior communicating arteries: Present on the right. Posterior cerebral arteries: Normal. Basilar artery: Normal. Vertebral arteries: Left dominant. Normal. Superior cerebellar arteries: Normal. Anterior inferior cerebellar arteries: Normal. Posterior inferior cerebellar arteries: Normal. MRA NECK FINDINGS Aortic arch: Normal 3 vessel aortic branching pattern. The visualized subclavian arteries are normal. Right carotid system: There is a small rounded focus measuring 4 x 3 mm projecting posteriorly from the proximal right internal carotid artery. There is no hemodynamically significant right carotid stenosis. Left carotid system: There is complete loss of contrast enhancement within the proximal left internal  carotid artery over a short  segment. The remainder of the left internal carotid artery is normal. Vertebral arteries: Left dominant. Vertebral artery origins are normal. Vertebral arteries are normal in course and caliber to the vertebrobasilar confluence without stenosis or evidence of dissection. IMPRESSION: 1. Multifocal acute ischemia within the left hemisphere, predominantly within the deep watershed zone and left ACA/MCA cortical watershed zone. No hemorrhage or mass effect. 2. Small subacute infarct within the right-sided white matter adjacent to the corpus callosum splenium. 3. No intracranial occlusion or high-grade stenosis. 4. Loss of enhancement of the proximal left internal carotid artery over a short segment with near-immediate reconstitution. This indicates either critical stenosis or occlusion. 5. 4 x 3 mm rounded focus projecting posteriorly from the proximal right internal carotid artery, most consistent with a small aneurysm. This might be artifactual secondary to the presence of atherosclerotic plaque at the bifurcation. If clinically warranted, this could be further characterized with CTA of the neck. Electronically Signed   By: Deatra Robinson M.D.   On: 02/13/2018 06:21     CBC Recent Labs  Lab 02/11/18 1837 02/11/18 1900 02/13/18 0058 02/13/18 0102 02/13/18 0855 02/14/18 0842  WBC 6.1  --  6.6  --  6.2 6.9  HGB 13.5 14.6 12.9* 13.3 13.6 13.2  HCT 42.2 43.0 39.6 39.0 41.4 40.4  PLT 238  --  221  --  247 247  MCV 76.0*  --  75.3*  --  73.7* 74.5*  MCH 24.3*  --  24.5*  --  24.2* 24.4*  MCHC 32.0  --  32.6  --  32.9 32.7  RDW 15.5  --  15.7*  --  15.2 15.6*  LYMPHSABS 2.4  --  2.1  --   --   --   MONOABS 0.9  --  0.7  --   --   --   EOSABS 0.1  --  0.2  --   --   --   BASOSABS 0.0  --  0.0  --   --   --     Chemistries  Recent Labs  Lab 02/11/18 1837 02/11/18 1900 02/13/18 0058 02/13/18 0102 02/13/18 0855 02/14/18 0842 02/15/18 0636  NA 138 140 138 142  --  140 139  K 3.8 3.8 3.3* 3.4*   --  4.1 4.2  CL 103 102 107 104  --  111 112*  CO2 25  --  25  --   --  23 23  GLUCOSE 113* 113* 112* 109*  --  94 95  BUN 23* 25* 17 20  --  9 7  CREATININE 1.57* 1.50* 1.55* 1.50* 1.07 1.10 1.07  CALCIUM 9.1  --  8.3*  --   --  8.5* 8.5*  AST 21  --  18  --   --  16  --   ALT 17  --  15*  --   --  14*  --   ALKPHOS 74  --  58  --   --  59  --   BILITOT 0.4  --  0.4  --   --  0.6  --    ------------------------------------------------------------------------------------------------------------------ Recent Labs    02/13/18 0855  CHOL 213*  HDL 42  LDLCALC 151*  TRIG 98  CHOLHDL 5.1    Lab Results  Component Value Date   HGBA1C 5.7 (H) 02/13/2018   ------------------------------------------------------------------------------------------------------------------ No results for input(s): TSH, T4TOTAL, T3FREE, THYROIDAB in the last 72 hours.  Invalid input(s): FREET3 ------------------------------------------------------------------------------------------------------------------  No results for input(s): VITAMINB12, FOLATE, FERRITIN, TIBC, IRON, RETICCTPCT in the last 72 hours.  Coagulation profile Recent Labs  Lab 02/11/18 1837  INR 1.02    No results for input(s): DDIMER in the last 72 hours.  Cardiac Enzymes Recent Labs  Lab 02/13/18 0855 02/13/18 1439 02/13/18 2007  TROPONINI <0.03 <0.03 <0.03   ------------------------------------------------------------------------------------------------------------------ No results found for: BNP   Shon Hale M.D on 02/15/2018 at 3:22 PM  Between 7am to 7pm - Pager - [phone]  After 7pm go to [email or web address] - password [email or web address]  [facility] -  Office  [phone]   Voice Recognition Reubin [place] dictation system was used to create this note, attempts have been made to correct errors. Please contact the author with questions and/or clarifications.

## 2018-02-15 NOTE — Progress Notes (Signed)
STROKE TEAM PROGRESS NOTE          SUBJECTIVE (INTERVAL HISTORY) His family is not at bedside, . Patient just returned from cerebral catheter angiogram which showed  . 1.95 % plus LT ICA prox stenosis with slow antegrade flow. 2.Approx 35 % stenosis of RT ICA prox with a 5mm smoth ulcerated plaque 3.approx 40 % stenosis of LT VA origin. 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     OBJECTIVE Temp:  [97.4 F (36.3 C)-98.1 F (36.7 C)] 97.4 F (36.3 C) (05/28 0746) Pulse Rate:  [58-93] 88 (05/28 1215) Cardiac Rhythm: Normal sinus rhythm (05/28 1215) Resp:  [14-54] 16 (05/28 1215) BP: (150-175)/(79-97) 156/84 (05/28 1215) SpO2:  [96 %-100 %] 99 % (05/28 1215)  CBC:  Recent Labs  Lab 02/11/18 1837  02/13/18 0058  02/13/18 0855 02/14/18 0842  WBC 6.1  --  6.6  --  6.2 6.9  NEUTROABS 2.7  --  3.6  --   --   --   HGB 13.5   < > 12.9*   < > 13.6 13.2  HCT 42.2   < > 39.6   < > 41.4 40.4  MCV 76.0*  --  75.3*  --  73.7* 74.5*  PLT 238  --  221  --  247 247   < > = values in this interval not displayed.    Basic Metabolic Panel:  Recent Labs  Lab 02/14/18 0842 02/15/18 0636  NA 140 139  K 4.1 4.2  CL 111 112*  CO2 23 23  GLUCOSE 94 95  BUN 9 7  CREATININE 1.10 1.07  CALCIUM 8.5* 8.5*    Lipid Panel:     Component Value Date/Time   CHOL 213 (H) 02/13/2018 0855   TRIG 98 02/13/2018 0855   HDL 42 02/13/2018 0855   CHOLHDL 5.1 02/13/2018 0855   VLDL 20 02/13/2018 0855   LDLCALC 151 (H) 02/13/2018 0855   HgbA1c:  Lab Results  Component Value Date   HGBA1C 5.7 (H) 02/13/2018   Urine Drug Screen: No results found for: LABOPIA, COCAINSCRNUR, LABBENZ, AMPHETMU, THCU, LABBARB  Alcohol Level No results found for: [facility]  IMAGING  Dg Chest 2 View 02/13/2018 IMPRESSION:  1. No acute cardiopulmonary abnormalities.  2.  Aortic Atherosclerosis (ICD10-I70.0).     Dg Abd 1 View 02/13/2018 IMPRESSION:  Metallic foreign bodies are demonstrated in  the soft tissues over the right hip and in the left upper quadrant.   Ct Head Wo Contrast 02/12/2018 IMPRESSION:  No acute intracranial abnormalities. Chronic atrophy and small vessel ischemic changes. Old right parietal infarct.    Ct Head Wo Contrast 02/11/2018 IMPRESSION:  1. No definite CT evidence for acute intracranial abnormality.  2. Atrophy and right temporal lobe encephalomalacia.    Mr Brain 43 Contrast Mr Angiogram Head Wo Contrast Mr Angiogram Neck W Or Wo Contrast 02/13/2018 IMPRESSION:  1. Multifocal acute ischemia within the left hemisphere, predominantly within the deep watershed zone and left ACA/MCA cortical watershed zone. No hemorrhage or mass effect.  2. Small subacute infarct within the right-sided white matter adjacent to the corpus callosum splenium.  3. No intracranial occlusion or high-grade stenosis.  4. Loss of enhancement of the proximal left internal carotid artery over a short segment with near-immediate reconstitution. This indicates either critical stenosis or occlusion.  5. 4 x 3 mm rounded focus projecting posteriorly from the proximal right internal carotid artery, most consistent with a small aneurysm.  This might be artifactual secondary to the presence of atherosclerotic plaque at the bifurcation. If clinically warranted, this could be further characterized with CTA of the neck.   Cerebral catheter angiogram 02/15/18 : . 1.95 % plus LT ICA prox stenosis with slow antegrade flow. 2.Approx 35 % stenosis of RT ICA prox with a 5mm smoth ulcerated plaque 3.approx 40 % stenosis of LT VA origin. 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 Wall thickness was   normal. Systolic function was normal. The estimated ejection   fraction was in the range of 50% to 55%. Wall motion was normal;    PHYSICAL EXAM Vitals:   02/15/18 1145 02/15/18 1150 02/15/18 1200  02/15/18 1215  BP: (!) 166/90 (!) 163/96 (!) 159/85 (!) 156/84  Pulse: 89 93 88 88  Resp: 16 14 (!) 54 16  Temp:      TempSrc:      SpO2: 98% 98% 98% 99%  Weight:      Height:        General -  Heart - Regular rate and rhythm - no murmer appreciated Lungs - Clear to auscultation anteriorly Abdomen - Soft - non tender Extremities - Distal pulses intact - no edema Skin - Warm and dry  Mental Status: Patient is awake and following some commands, not oriented to year or month. Cranial Nerves: II: Discs not visualized; unable to assess visual fields,  pupils equal, round, reactive to light. III,IV, VI: ptosis not present, conjugate gaze V,VII: right lower facial weakness VIII: hearing appears normal to vpice IX,X: +cough and gag XI: bilateral shoulder shrug intact. XII: midline tongue extension Motor:right arm and leg are antigravity with mild drift, left intact  Tone and bulk:normal tone throughout; no atrophy noted Sensory: grimaces to pain x 4 Deep Tendon Reflexes: symmetric throughout      ASSESSMENT/PLAN Mr. Calahan Pak is a 77 y.o. male with history of hypertension and a previous stroke presenting with right sided weakness and AMS. He did not receive IV t-PA due to late presentation.  Stroke: Multifocal acute ischemia within the left hemisphere due to severe left carotid stenosis  Resultant mild hemiparesis, encephalopathy  CT head - No definite CT evidence for acute intracranial abnormality. Old right parietal infarct.   MRI head - Multifocal acute ischemia within the left hemisphere,  MRA head - unremarkable.  MRA - Neck - . Loss of enhancement of the prox L ICA. 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  Carotid Doppler - MRA / CTA neck  CTA neck critical left ICA stenosis 2D Echo - Left ventricle: The cavity size was normal. Wall thickness was   normal. Systolic function was normal. The estimated ejection    fraction was in the range of 50% to 55%. Wall motion was  normal;LDL - 151  HgbA1c - 5.7  VTE prophylaxis - Lovenox Diet Order           DIET DYS 2 Room service appropriate? Yes; Fluid consistency: Nectar Thick  Diet effective now          No antithrombotic prior to admission, now on ASA and Plavix  Patient counseled to be compliant with his antithrombotic medications  Ongoing aggressive stroke risk factor management  Therapy recommendations:  pending  Disposition:  Pending  Hypertension  Stable . Permissive hypertension (OK if < 220/120) but gradually normalize in 5-7 days . Long-term BP goal normotensive  Hyperlipidemia  Lipid lowering medication PTA:  none  LDL 151, goal < 70  Current lipid lowering medication:  started Lipitor 40 mg daily.  Continue statin at discharge   Other Stroke Risk Factors  Advanced age  Hx of stroke/TIA   Other Active Problems  Mild hypokalemia   Plan / Recommendations   Stroke workup: Criticial CTA stenosis on DUAP cerebral arteriogram Tuesday with Dr. Titus Dubin, at this time BP is > 140, need to keep BP elevated due to critical stenosis  Therapy Follow Up: pending  Disposition: pending  Antiplatelet / Anticoagulation: DUAP  Statin: Lipitor 40 mg daily  MD Follow Up: [facility] in 6-8 weeks  Other: pending  Further risk factor modification per primary care MD: Follow Up 2 weeks   Hospital day # 2  Personally  participated in, made any corrections needed, and agree with history, physical, neuro exam,assessment and plan as stated above.  Recommend vascular surgery consult for elective left carotid revascularization in the next 2-3 weeks. D/W   patient and Dr Marisa Severin. Greater than 50% time during this 25 minute visit was spent on counseling and coordination of care about his symptomatic carotid stenosis answering questions.   Delia Heady, MD [facility]   To contact Stroke Continuity provider, please refer to [email or web address]. After  hours, contact General Neurology

## 2018-02-16 ENCOUNTER — Inpatient Hospital Stay (HOSPITAL_COMMUNITY): Payer: Medicare Other

## 2018-02-16 ENCOUNTER — Encounter (HOSPITAL_COMMUNITY): Payer: Self-pay | Admitting: Interventional Radiology

## 2018-02-16 ENCOUNTER — Telehealth (HOSPITAL_COMMUNITY): Payer: Self-pay

## 2018-02-16 DIAGNOSIS — E7800 Pure hypercholesterolemia, unspecified: Secondary | ICD-10-CM

## 2018-02-16 DIAGNOSIS — Z1389 Encounter for screening for other disorder: Secondary | ICD-10-CM

## 2018-02-16 DIAGNOSIS — J982 Interstitial emphysema: Secondary | ICD-10-CM

## 2018-02-16 DIAGNOSIS — E876 Hypokalemia: Secondary | ICD-10-CM

## 2018-02-16 DIAGNOSIS — R4701 Aphasia: Secondary | ICD-10-CM

## 2018-02-16 DIAGNOSIS — R29810 Facial weakness: Secondary | ICD-10-CM

## 2018-02-16 DIAGNOSIS — I639 Cerebral infarction, unspecified: Secondary | ICD-10-CM

## 2018-02-16 DIAGNOSIS — I63522 Cerebral infarction due to unspecified occlusion or stenosis of left anterior cerebral artery: Principal | ICD-10-CM

## 2018-02-16 DIAGNOSIS — L905 Scar conditions and fibrosis of skin: Secondary | ICD-10-CM

## 2018-02-16 DIAGNOSIS — R911 Solitary pulmonary nodule: Secondary | ICD-10-CM

## 2018-02-16 DIAGNOSIS — I63529 Cerebral infarction due to unspecified occlusion or stenosis of unspecified anterior cerebral artery: Secondary | ICD-10-CM

## 2018-02-16 DIAGNOSIS — B449 Aspergillosis, unspecified: Secondary | ICD-10-CM

## 2018-02-16 DIAGNOSIS — I6523 Occlusion and stenosis of bilateral carotid arteries: Secondary | ICD-10-CM

## 2018-02-16 DIAGNOSIS — G8191 Hemiplegia, unspecified affecting right dominant side: Secondary | ICD-10-CM

## 2018-02-16 DIAGNOSIS — I771 Stricture of artery: Secondary | ICD-10-CM

## 2018-02-16 LAB — BASIC METABOLIC PANEL
Anion gap: 10 (ref 5–15)
BUN: 7 mg/dL (ref 6–20)
CO2: 23 mmol/L (ref 22–32)
Calcium: 8.8 mg/dL — ABNORMAL LOW (ref 8.9–10.3)
Chloride: 106 mmol/L (ref 101–111)
Creatinine, Ser: 1.06 mg/dL (ref 0.61–1.24)
GFR calc Af Amer: >60 mL/min (ref >60)
GFR calc non Af Amer: >60 mL/min (ref >60)
Glucose, Bld: 102 mg/dL — ABNORMAL HIGH (ref 65–99)
Potassium: 3.8 mmol/L (ref 3.5–5.1)
Sodium: 139 mmol/L (ref 135–145)

## 2018-02-16 LAB — CBC
HCT: 39.8 % (ref 39.0–52.0)
Hemoglobin: 13.1 g/dL (ref 13.0–17.0)
MCH: 24.4 pg — ABNORMAL LOW (ref 26.0–34.0)
MCHC: 32.9 g/dL (ref 30.0–36.0)
MCV: 74.3 fL — ABNORMAL LOW (ref 78.0–100.0)
Platelets: 250 10*3/uL (ref 150–400)
RBC: 5.36 MIL/uL (ref 4.22–5.81)
RDW: 15.6 % — ABNORMAL HIGH (ref 11.5–15.5)
WBC: 9.4 10*3/uL (ref 4.0–10.5)
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
## 2018-02-16 NOTE — Progress Notes (Addendum)
OT Cancellation Note  Patient Details Name: Jorge Burns MRN: [id number] DOB: [birth year]   Cancelled Treatment:    Reason Eval/Treat Not Completed: Other (comment)(Interpreter failed to show up). Had appointment scheduled with interpreter at 10:00 am, waited until 10:15 and called - no one is here. Attempting to re-schedule for 2:00, if they are not available will attempt tomorrow.   Evern Bio [name] 02/16/2018, 10:26 AM  Sherryl Manges OTR/L [phone]  Attempted again today at 2:00pm. Interpreter present, however Pt had been taken to STAT CT scan, and is off the floor. OT will attempt again tomorrow.   Sherryl Manges OTR/L [phone]

## 2018-02-16 NOTE — Progress Notes (Signed)
Inpatient Rehabilitation Admissions Coordinator  I met with patient at bedside with church friend whp speaks [place]. I then contacted [name], friend to whom he lives with she and her family. She states she can arrange 24/7 assist at home when pt ready for d/c, but she want shim to get as much help while hospitalized that he can. She states she has a conference meeting with a doctor Friday at 10 am to discuss his medical needs. She does not recall name of doctor. I will follow his progress once medical work up is complete to assist with planing dispo. Best time to speak with [name] is after 12 noon due to class schedule. Free all day on Fridays.  Danne Baxter, RN, MSN Rehab Admissions Coordinator [phone] 02/16/2018 2:39 PM

## 2018-02-16 NOTE — Evaluation (Signed)
Speech Language Pathology Evaluation Patient Details Name: Jorge Burns MRN: [id number] DOB: [birth year] Today's Date: 02/16/2018 Time: 1410-1420 SLP Time Calculation (min) (ACUTE ONLY): 10 min  Problem List:  Patient Active Problem List   Diagnosis Date Noted  . Diastolic dysfunction   . Prediabetes   . CVA (cerebral vascular accident) (HCC) 02/13/2018  . Influenza B 11/29/2015  . History of CVA (cerebrovascular accident)   . Language barrier   . Benign essential HTN   . Syncope 11/28/2015   Past Medical History:  Past Medical History:  Diagnosis Date  . Stroke [facility])    "a long time ago -- 2 years ago"   Past Surgical History:  Past Surgical History:  Procedure Laterality Date  . IR ANGIO INTRA EXTRACRAN SEL COM CAROTID INNOMINATE BILAT MOD SED  02/15/2018  . IR ANGIO VERTEBRAL SEL SUBCLAVIAN INNOMINATE UNI R MOD SED  02/15/2018  . IR ANGIO VERTEBRAL SEL VERTEBRAL UNI L MOD SED  02/15/2018   HPI:  [name] is a 77 y.o. male with history of hypertension and a previous stroke presenting with right sided weakness and AMS. He did not receive IV t-PA due to late presentation. MRI showed multifocal acute ischemia within the left hemisphere due to severe left carotid stenosis. Pt initially passed stroke swallow screen, referred for swallow evaluation due to MD, RN concerns; MD downgraded to D1/nectar.  Repeat MRI 5/29 Expected evolution of the Left MCA and MCA/ACA watershed territory infarcts seen on 02/13/2018 with no associated hemorrhage or mass effect.   Assessment / Plan / Recommendation Clinical Impression  Pt presents with an expressive/receptive aphasia - interpreter present for exam.  Demonstrates inconsistent ability to follow simple, highly-contextual one step commands; able to copy examiner for closing eyes, making fist.  Able to count from one-three and state name in unison with interpreter.  Did not respond to yes/no questions or demonstrate ability to name familiar items.   Verbalizations were limited, low in volume but intelligible approximations of target words.  Recommend further SLP for ongoing evaluation of language capabilities.  Our services will follow for aphasia and dysphagia.      SLP Assessment  SLP Recommendation/Assessment: Patient needs continued Speech Lanaguage Pathology Services SLP Visit Diagnosis: Aphasia (R47.01)    Follow Up Recommendations  Inpatient Rehab    Frequency and Duration min 2x/week  2 weeks      SLP Evaluation Cognition  Overall Cognitive Status: Impaired/Different from baseline(difficult to assess secondary to language deficits) Arousal/Alertness: Awake/alert Attention: Focused Focused Attention: Appears intact       Comprehension  Auditory Comprehension Overall Auditory Comprehension: Impaired Yes/No Questions: Impaired Basic Biographical Questions: 0-25% accurate Commands: Impaired One Step Basic Commands: 25-49% accurate Conversation: Simple Interfering Components: Processing speed Visual Recognition/Discrimination Discrimination: Not tested    Expression Expression Primary Mode of Expression: Verbal Verbal Expression Overall Verbal Expression: Impaired Initiation: Impaired Automatic Speech: Name;Counting(able to repeat name and count 1-3 in unison with intrepreter) Level of Generative/Spontaneous Verbalization: Word Repetition: Impaired Level of Impairment: Word level Naming: Impairment Responsive: 0-25% accurate Confrontation: Impaired Convergent: 0-24% accurate   Oral / Motor  Oral Motor/Sensory Function Overall Oral Motor/Sensory Function: Moderate impairment Facial ROM: Reduced right;Suspected CN VII (facial) dysfunction Facial Symmetry: Abnormal symmetry right;Suspected CN VII (facial) dysfunction Facial Strength: Reduced right;Suspected CN VII (facial) dysfunction Motor Speech Overall Motor Speech: Impaired Articulation: (insufficient verbalizations for interpreter to determine)    GO  Blenda Mounts Laurice 02/16/2018, 4:47 PM

## 2018-02-16 NOTE — Progress Notes (Signed)
PROGRESS NOTE  Jorge Burns UJW:[id number] DOB: 06-14-1941 DOA: 02/12/2018 PCP: Patient, No Pcp Per   LOS: 3 days   Brief Narrative / Interim history: 77 year old male with history of prior CVA, hypertension, was admitted to the hospital on 5/26 with right-sided weakness, facial droop, headache and slurred speech which began the same day.  He was evaluated in the ED the day prior for migraines.  He had persistent symptoms the next day, and he was found to have acute left ACA/MCA strokes and was admitted to the hospital.  Neurology was consulted  Assessment & Plan: Active Problems:   CVA (cerebral vascular accident) (HCC)   Diastolic dysfunction   Prediabetes   Acute CVA -On admission underwent an MRI which showed multifocal acute ischemia within the left hemisphere, predominantly within the deep watershed zone and left ACA/MCA cortical watershed zone without hemorrhage or mass-effect.  He also has a small subacute infarct within the right sided white matter adjacent to the corpus callosum splenium.  Vascular studies with CT angiogram showed 50% stenosis right ICA and critical stenosis left ICA.  Neuro IR was consulted and patient underwent cerebral angiogram on 5/28 which confirmed a 95% left ICA stenosis. -Appreciate neurology evaluation, recommending vascular evaluation, awaiting input -Continue aspirin and Plavix, continue Lipitor -2D echo with preserved EF, no evidence of intracardiac thrombus -A1c was 5.7, LDL is elevated 151, continue statin -On dysphagia 2 diet per speech eval -Patient nonverbal this morning, per family member who is at bedside this is new, discussed with neurology who will evaluate patient as well  Left upper lung apical scaring -Possible fungus ball versus TB based on the CT scan, will obtain a dedicated CT chest today, discussed with Dr. Vassie Loll with pulmonology as well as infectious disease who will evaluate patient.  Per pulmonology this is less likely TB but will  follow on the CT scan -Patient currently aphasic and cannot contribute to the story   DVT prophylaxis: heparin Code Status: Full code Family Communication: family at bedside Disposition Plan: CIR vs SNF  Consultants:   Neurology  ID  Pulmonary  Procedures:  2D echo: Impressions: - Normal LV systolic function; mild diastolic dysfunction; sclerotic aortic valve with trace AI; mild MR; mild LAE; mild TR with mild pulmonary hypertension.   02/13/18 CTA neck IMPRESSION: 50% stenosis RIGHT ICA, with a 3-4 mm posterior projecting ulceration. This could serve as a source of distal emboli. Critical stenosis LEFT ICA. Diminished caliber of the distal cervical segment.  02/13/18 MRI Brain 1. Multifocal acute ischemia within the left hemisphere, predominantly within the deep watershed zone and left ACA/MCA cortical watershed zone. No hemorrhage or mass effect. 2. Small subacute infarct within the right-sided white matter adjacent to the corpus callosum splenium.  Cerebral catheter angiogram5/28/19 : . 1)95 % plus LT ICA prox stenosis with slow antegrade flow. 2)Approx 35 % stenosis of RT ICA prox with a 5mm smoth ulcerated plaque 3)Approx 40 % stenosis of LT VA origin. 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  Interpretation provided by family friend who is at bedside, patient is nonverbal and it appears that this is new and was able to talk yesterday  Objective: Vitals:   02/15/18 2006 02/15/18 2341 02/16/18 0351 02/16/18 0811  BP: (!) 164/80 (!) 153/85 (!) 149/81 (!) 173/90  Pulse: 69 80 81 92  Resp: Temp: 98.3 F (36.8 C) 97.7 F (36.5 C) 97.8 F (36.6 C) 97.7 F (36.5 C)  TempSrc: Oral Oral Oral Oral  SpO2: 99% 99% 98% 96%  Weight:      Height:        Intake/Output  Summary (Last 24 hours) at 02/16/2018 1134 Last data filed at 02/16/2018 0254 Gross per 24 hour  Intake 4544.17 ml  Output 1500 ml  Net 3044.17 ml   Filed Weights   02/12/18 2322 02/13/18 1336 02/13/18 1434  Weight: 63.5 kg (140 lb) 63.5 kg (140 lb) 57.2 kg (126 lb 1.7 oz)    Examination:  Constitutional: NAD Eyes: lids and conjunctivae normal ENMT: Mucous membranes are moist.  Neck: normal, supple Respiratory: clear to auscultation bilaterally, no wheezing, no crackles. Normal respiratory effort. No accessory muscle use.  Cardiovascular: Regular rate and rhythm, no murmurs / rubs / gallops. No LE edema. 2+ pedal pulses. Abdomen: no tenderness. Bowel sounds positive.  Skin: no rashes Neurologic: right sided weakness. Aphasic   Data Reviewed: I have independently reviewed following labs and imaging studies   CBC: Recent Labs  Lab 02/11/18 1837  02/13/18 0058 02/13/18 0102 02/13/18 0855 02/14/18 0842 02/16/18 0624  WBC 6.1  --  6.6  --  6.2 6.9 9.4  NEUTROABS 2.7  --  3.6  --   --   --   --   HGB 13.5   < > 12.9* 13.3 13.6 13.2 13.1  HCT 42.2   < > 39.6 39.0 41.4 40.4 39.8  MCV 76.0*  --  75.3*  --  73.7* 74.5* 74.3*  PLT 238  --  221  --  247 247 250   < > = values in this interval not displayed.   Basic Metabolic Panel: Recent Labs  Lab 02/11/18 1837  02/13/18 0058 02/13/18 0102 02/13/18 4098 02/14/18 1191 02/15/18 0636 02/16/18 0624  NA 138   < > 138 142  --  140 139 139  K 3.8   < > 3.3* 3.4*  --  4.1 4.2 3.8  CL 103   < > 107 104  --  111 112* 106  CO2 25  --  25  --   --  GLUCOSE 113*   < > 112* 109*  --  94 95 102*  BUN 23*   < > 17 20  --  CREATININE 1.57*   < > 1.55* 1.50* 1.07 1.10 1.07 1.06  CALCIUM 9.1  --  8.3*  --   --  8.5* 8.5* 8.8*   < > = values in this interval not displayed.   GFR: Estimated Creatinine Clearance: 48.7 mL/min (by C-G formula based on SCr of 1.06 mg/dL). Liver Function Tests: Recent Labs  Lab  02/11/18 1837 02/13/18 0058 02/14/18 0842  AST ALT 17 15* 14*  ALKPHOS 74 58 59  BILITOT 0.4 0.4 0.6  PROT 7.8 6.8 6.7  ALBUMIN 3.7 3.1* 3.2*   No results for input(s): LIPASE, AMYLASE in the last 168 hours. No results for input(s): AMMONIA in the last 168 hours. Coagulation Profile: Recent Labs  Lab 02/11/18 1837  INR 1.02   Cardiac Enzymes: Recent Labs  Lab 02/13/18 0855 02/13/18 1439 02/13/18 2007  TROPONINI <0.03 <0.03 <0.03   BNP (last 3 results) No results for input(s): PROBNP in the last 8760 hours. HbA1C: No results for  input(s): HGBA1C in the last 72 hours. CBG: No results for input(s): GLUCAP in the last 168 hours. Lipid Profile: No results for input(s): CHOL, HDL, LDLCALC, TRIG, CHOLHDL, LDLDIRECT in the last 72 hours. Thyroid Function Tests: No results for input(s): TSH, T4TOTAL, FREET4, T3FREE, THYROIDAB in the last 72 hours. Anemia Panel: No results for input(s): VITAMINB12, FOLATE, FERRITIN, TIBC, IRON, RETICCTPCT in the last 72 hours. Urine analysis:    Component Value Date/Time   COLORURINE STRAW (A) 02/13/2018 0005   APPEARANCEUR CLEAR 02/13/2018 0005   LABSPEC 1.017 02/13/2018 0005   PHURINE 6.0 02/13/2018 0005   GLUCOSEU NEGATIVE 02/13/2018 0005   HGBUR NEGATIVE 02/13/2018 0005   BILIRUBINUR NEGATIVE 02/13/2018 0005   KETONESUR NEGATIVE 02/13/2018 0005   PROTEINUR NEGATIVE 02/13/2018 0005   NITRITE NEGATIVE 02/13/2018 0005   LEUKOCYTESUR NEGATIVE 02/13/2018 0005   Sepsis Labs: Invalid input(s): PROCALCITONIN, LACTICIDVEN  No results found for this or any previous visit (from the past 240 hour(s)).    Radiology Studies: Ir Angio Intra Extracran Sel Com Carotid Innominate Bilat Mod Sed  Result Date: 02/16/2018 CLINICAL DATA:  Left cerebral hemispheric watershed strokes with right-sided weakness, and expressive aphasia. EXAM: IR ANGIO VERTEBRAL SEL VERTEBRAL UNI LEFT MOD SED; BILATERAL COMMON CAROTID AND INNOMINATE ANGIOGRAPHY;  IR ANGIO VERTEBRAL SEL SUBCLAVIAN INNOMINATE UNI RIGHT MOD SED COMPARISON:  CT angiogram the head and neck of 02/12/2018. MEDICATIONS: Heparin 1000 units IV; no antibiotic was administered within 1 hour of the procedure. ANESTHESIA/SEDATION: Versed 1 mg IV; Fentanyl 25 mcg IV.  Hydralazine 20 mg. Moderate Sedation Time:  30 minutes. The patient was continuously monitored during the procedure by the interventional radiology nurse under my direct supervision. CONTRAST:  Isovue 300 approximately 60 mL. FLUOROSCOPY TIME:  Fluoroscopy Time: 10 minutes 18 seconds (793 mGy). COMPLICATIONS: None immediate. TECHNIQUE: Informed written consent was obtained from the patient's niece after a thorough discussion of the procedural risks, benefits and alternatives. All questions were addressed. Maximal Sterile Barrier Technique was utilized including caps, mask, sterile gowns, sterile gloves, sterile drape, hand hygiene and skin antiseptic. A timeout was performed prior to the initiation of the procedure. During the procedure, an interpreter was present to assist with communication. The right groin was prepped and draped in the usual sterile fashion. Thereafter using modified Seldinger technique, transfemoral access into the right common femoral artery was obtained without difficulty. Over a 0.035 inch guidewire, a 5 French Pinnacle sheath was inserted. Through this, and also over 0.035 inch guidewire, a 5 [place] JB 1 catheter was advanced to the aortic arch region and selectively positioned in the right common carotid artery, the right subclavian artery, the left common carotid artery and the left vertebral artery. FINDINGS: The left common carotid arteriogram demonstrates the left external carotid artery and its major branches to be widely patent. The left internal carotid artery just distal to the bulb has a severe pre occlusive 95% plus stenosis secondary to circumferential plaque. Distal to this there is mild poststenotic  dilatation. More distally, there slow ascent of contrast to the cranial skull base in the left internal carotid artery which appears normal in caliber. The petrous, cavernous and the supraclinoid left ICA are widely patent. The left middle cerebral artery and the left anterior cerebral artery opacify into the capillary and venous phases. The right common carotid arteriogram demonstrates the origin of the right external carotid artery to be widely patent. There a mild stenosis of the proximal right external carotid artery. Its branches are seen to opacify  normally. The right internal carotid artery at the bulb demonstrates an approximately 35-40% stenosis by the NASCET criteria associated with an approximately 5 mm well-defined ulcerated plaque with a resulting pseudoaneurysm measuring approximately 5 mm. No evidence of intraluminal filling defects are seen. The right internal carotid artery distal to this is widely patent. The petrous, cavernous and supraclinoid segments demonstrate wide patency. A right posterior communicating artery is seen opacifying the right posterior cerebral artery distribution. The right middle cerebral artery and the right anterior cerebral artery opacify into the capillary and venous phases. There is mild to moderate stenosis of the proximal inferior division of the right middle cerebral artery, and of the superior division probably representative of intracranial arterial sclerotic plaque. 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 The origin of the right vertebral artery is widely patent. This vessel is seen to opacify to the cranial skull base. There is normal opacification of the hypoplastic right vertebrobasilar junction and the right posterior-inferior cerebellar artery. More distally, there is opacification of the distal right vertebrobasilar junction, with opacification of the basilar artery and the left  posterior cerebral artery and transiently the superior cerebellar arteries. The dominant left vertebral artery has approximately 35-40% stenosis just distal to its origin. The vessel is, otherwise, seen to opacify normally to the cranial skull base. Normal opacification is seen in the left vertebrobasilar junction and the left posterior-inferior cerebellar artery. The basilar artery, the left posterior cerebral artery, the superior cerebellar arteries and the anterior-inferior cerebellar arteries are seen to opacify normally into the capillary and venous phases. Delayed arterial phase demonstrates retrograde opacification of the left posterior parietal cortical and subcortical regions from the leptomeningeal branches arising from the P3 segment of the left posterior cerebral artery. 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 Approximately 35-40% stenosis of the right internal carotid artery just distal to the bulb associated with a 5 mm smooth ulceration/pseudoaneurysm. 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 PLAN: Will discuss findings with patient's neurologist. Electronically Signed   By: Julieanne Cotton M.D.   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 EXAM: IR ANGIO VERTEBRAL SEL VERTEBRAL UNI LEFT MOD SED; BILATERAL COMMON CAROTID AND INNOMINATE ANGIOGRAPHY; IR ANGIO VERTEBRAL SEL SUBCLAVIAN INNOMINATE UNI RIGHT MOD SED COMPARISON:  CT angiogram the head and neck of 02/12/2018. MEDICATIONS: Heparin 1000 units IV; no antibiotic was administered within 1 hour of the procedure. ANESTHESIA/SEDATION: Versed 1 mg IV;  Fentanyl 25 mcg IV.  Hydralazine 20 mg. Moderate Sedation Time:  30 minutes. The patient was continuously monitored during the procedure by the interventional radiology nurse under my direct supervision. CONTRAST:  Isovue 300 approximately 60 mL. FLUOROSCOPY TIME:  Fluoroscopy Time: 10 minutes 18 seconds (793 mGy). COMPLICATIONS: None immediate. TECHNIQUE: Informed written consent was obtained from the patient's niece after a thorough discussion of the procedural risks, benefits and alternatives. All questions were addressed. Maximal Sterile Barrier Technique was utilized including caps, mask, sterile gowns, sterile gloves, sterile drape, hand hygiene and skin antiseptic. A timeout was performed prior to the initiation of the procedure. During the  procedure, an interpreter was present to assist with communication. The right groin was prepped and draped in the usual sterile fashion. Thereafter using modified Seldinger technique, transfemoral access into the right common femoral artery was obtained without difficulty. Over a 0.035 inch guidewire, a 5 French Pinnacle sheath was inserted. Through this, and also over 0.035 inch guidewire, a 5 [place] JB 1 catheter was advanced to the aortic arch region and selectively positioned in the right common carotid artery, the right subclavian artery, the left common carotid artery and the left vertebral artery. FINDINGS: The left common carotid arteriogram demonstrates the left external carotid artery and its major branches to be widely patent. The left internal carotid artery just distal to the bulb has a severe pre occlusive 95% plus stenosis secondary to circumferential plaque. Distal to this there is mild poststenotic dilatation. More distally, there slow ascent of contrast to the cranial skull base in the left internal carotid artery which appears normal in caliber. The petrous, cavernous and the supraclinoid left ICA are widely patent. The left middle cerebral artery and  the left anterior cerebral artery opacify into the capillary and venous phases. The right common carotid arteriogram demonstrates the origin of the right external carotid artery to be widely patent. There a mild stenosis of the proximal right external carotid artery. Its branches are seen to opacify normally. The right internal carotid artery at the bulb demonstrates an approximately 35-40% stenosis by the NASCET criteria associated with an approximately 5 mm well-defined ulcerated plaque with a resulting pseudoaneurysm measuring approximately 5 mm. No evidence of intraluminal filling defects are seen. The right internal carotid artery distal to this is widely patent. The petrous, cavernous and supraclinoid segments demonstrate wide patency. A right posterior communicating artery is seen opacifying the right posterior cerebral artery distribution. The right middle cerebral artery and the right anterior cerebral artery opacify into the capillary and venous phases. There is mild to moderate stenosis of the proximal inferior division of the right middle cerebral artery, and of the superior division probably representative of intracranial arterial sclerotic plaque. 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 The origin of the right vertebral artery is widely patent. This vessel is seen to opacify to the cranial skull base. There is normal opacification of the hypoplastic right vertebrobasilar junction and the right posterior-inferior cerebellar artery. More distally, there is opacification of the distal right vertebrobasilar junction, with opacification of the basilar artery and the left posterior cerebral artery and transiently the superior cerebellar arteries. The dominant left vertebral artery has approximately 35-40% stenosis just distal to its origin. The vessel is, otherwise, seen to opacify normally to the cranial skull base. Normal  opacification is seen in the left vertebrobasilar junction and the left posterior-inferior cerebellar artery. The basilar artery, the left posterior cerebral artery, the superior cerebellar arteries and the anterior-inferior cerebellar arteries are seen to opacify normally into the capillary and venous phases. Delayed arterial phase demonstrates retrograde opacification of the left posterior parietal cortical and subcortical regions from the leptomeningeal branches arising from the P3 segment of the left posterior cerebral artery. 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 Approximately 35-40% stenosis of the right internal carotid artery just distal to the bulb associated with a 5 mm smooth ulceration/pseudoaneurysm. 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 PLAN: Will discuss findings with patient's neurologist. Electronically Signed   By: Julieanne Cotton M.D.   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 EXAM: IR ANGIO VERTEBRAL SEL VERTEBRAL UNI LEFT MOD SED; BILATERAL COMMON CAROTID AND INNOMINATE ANGIOGRAPHY; IR ANGIO VERTEBRAL SEL SUBCLAVIAN INNOMINATE UNI RIGHT MOD SED COMPARISON:  CT angiogram the head and neck of 02/12/2018. MEDICATIONS: Heparin 1000 units IV; no antibiotic was administered within 1 hour of the procedure. ANESTHESIA/SEDATION: Versed 1 mg IV; Fentanyl 25 mcg IV.  Hydralazine 20 mg. Moderate Sedation Time:  30 minutes. The patient was continuously monitored during the procedure by the interventional radiology nurse under my direct supervision. CONTRAST:  Isovue 300 approximately 60 mL. FLUOROSCOPY TIME:   Fluoroscopy Time: 10 minutes 18 seconds (793 mGy). COMPLICATIONS: None immediate. TECHNIQUE: Informed written consent was obtained from the patient's niece after a thorough discussion of the procedural risks, benefits and alternatives. All questions were addressed. Maximal Sterile Barrier Technique was utilized including caps, mask, sterile gowns, sterile gloves, sterile drape, hand hygiene and skin antiseptic. A timeout was performed prior to the initiation of the procedure. During the procedure, an interpreter was present to assist with communication. The right groin was prepped and draped in the usual sterile fashion. Thereafter using modified Seldinger technique, transfemoral access into the right common femoral artery was obtained without difficulty. Over a 0.035 inch guidewire, a 5 French Pinnacle sheath was inserted. Through this, and also over 0.035 inch guidewire, a 5 [place] JB 1 catheter was advanced to the aortic arch region and selectively positioned in the right common carotid artery, the right subclavian artery, the left common carotid artery and the left vertebral artery. FINDINGS: The left common carotid arteriogram demonstrates the left external carotid artery and its major branches to be widely patent. The left internal carotid artery just distal to the bulb has a severe pre occlusive 95% plus stenosis secondary to circumferential plaque. Distal to this there is mild poststenotic dilatation. More distally, there slow ascent of contrast to the cranial skull base in the left internal carotid artery which appears normal in caliber. The petrous, cavernous and the supraclinoid left ICA are widely patent. The left middle cerebral artery and the left anterior cerebral artery opacify into the capillary and venous phases. The right common carotid arteriogram demonstrates the origin of the right external carotid artery to be widely patent. There a mild stenosis of the proximal right external carotid artery.  Its branches are seen to opacify normally. The right internal carotid artery at the bulb demonstrates an approximately 35-40% stenosis by the NASCET criteria associated with an approximately 5 mm well-defined ulcerated plaque with a resulting pseudoaneurysm measuring approximately 5 mm. No evidence of intraluminal filling defects are seen. The right internal carotid artery distal to this is widely patent. The petrous, cavernous and supraclinoid segments demonstrate wide patency. A right posterior communicating artery is seen opacifying the right posterior cerebral artery distribution. The right middle cerebral artery and the right anterior cerebral artery opacify into the capillary and venous phases. There is mild to moderate stenosis of the proximal inferior division of the right middle cerebral artery, and of the superior division probably representative of intracranial arterial sclerotic plaque. The right subclavian artery at the origin of the right vertebral artery demonstrates  a 11.2 mm x 8.2 mm saccular aneurysm. The origin of the right vertebral artery is widely patent. This vessel is seen to opacify to the cranial skull base. There is normal opacification of the hypoplastic right vertebrobasilar junction and the right posterior-inferior cerebellar artery. More distally, there is opacification of the distal right vertebrobasilar junction, with opacification of the basilar artery and the left posterior cerebral artery and transiently the superior cerebellar arteries. The dominant left vertebral artery has approximately 35-40% stenosis just distal to its origin. The vessel is, otherwise, seen to opacify normally to the cranial skull base. Normal opacification is seen in the left vertebrobasilar junction and the left posterior-inferior cerebellar artery. The basilar artery, the left posterior cerebral artery, the superior cerebellar arteries and the anterior-inferior cerebellar arteries are seen to opacify  normally into the capillary and venous phases. Delayed arterial phase demonstrates retrograde opacification of the left posterior parietal cortical and subcortical regions from the leptomeningeal branches arising from the P3 segment of the left posterior cerebral artery. 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 Approximately 35-40% stenosis of the right internal carotid artery just distal to the bulb associated with a 5 mm smooth ulceration/pseudoaneurysm. 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 PLAN: Will discuss findings with patient's neurologist. Electronically Signed   By: Julieanne Cotton M.D.   On: 02/15/2018 12:21     Scheduled Meds: . aspirin  81 mg Oral Daily  . atorvastatin  80 mg Oral q1800  . clopidogrel  75 mg Oral Daily  . glycopyrrolate  0.2 mg Intravenous BID  . heparin injection (subcutaneous)  5,000 Units Subcutaneous Q8H   Continuous Infusions: . sodium chloride 100 mL/hr at 02/15/18 2133    Pamella Pert, MD, PhD [facility] Pager [phone]  If 7PM-7AM, please contact night-coverage [email or web address] Password [facility] 02/16/2018, 11:34 AM

## 2018-02-16 NOTE — Progress Notes (Signed)
Modified Barium Swallow Progress Note  Patient Details  Name: Jorge Burns MRN: [id number] Date of Birth: 1940-12-16  Today's Date: 02/16/2018  Modified Barium Swallow completed.  Full report located under Chart Review in the Imaging Section.  Brief recommendations include the following:  Clinical Impression  Pt presents with a moderate sensorimotor dysphagia marked by decreased bolus control/cohesion with anterior loss and right buccal residue post-swallow; decreased pharyngeal squeeze and reduced tongue base contact with pharyngeal walls, leading to vallecular residue post-swallow; silent aspiration of thin liquids with material entering posterior larynx/trachea before onset of pharyngeal swallow.  Due to aphasia, despite interpreter present to assist with communication, pt unable to follow commands for postural adjustments/swallow modifications.  Recommend continuing downgrading diet to dysphagia 1 for now; continue nectar thick liquids; give meds crushed in puree due to residue.  SLP will follow for safety/education and diet progression.  Interpreter relayed results of study to pt - aphasia compromises pt's comprehension.    Swallow Evaluation Recommendations       SLP Diet Recommendations: Dysphagia 1 (Puree) solids;Nectar thick liquid   Liquid Administration via: Cup   Medication Administration: Crushed with puree   Supervision: Staff to assist with self feeding   Compensations: Slow rate;Small sips/bites;Minimize environmental distractions;Lingual sweep for clearance of pocketing       Oral Care Recommendations: Oral care BID   Other Recommendations: Order thickener from pharmacy    Blenda Mounts Laurice 02/16/2018,4:39 PM

## 2018-02-16 NOTE — Progress Notes (Signed)
  Speech Language Pathology Treatment: Dysphagia  Patient Details Name: Jorge Burns MRN: [id number] DOB: 1941/08/10 Today's Date: 02/16/2018 Time: 1210-1220 SLP Time Calculation (min) (ACUTE ONLY): 10 min  Assessment / Plan / Recommendation Clinical Impression  Pt seen for dysphagia treatment.  Family friend is present.  Pt continues with right facial asymmetry, right inattention, limited verbal output.  He demonstrates right oral spillage/reduced oral retention of POs, delayed coughing associated with both thin and nectar thick liquids, concerning for aspiration.  RN confirms frequent coughing with breakfast despite modified diet.  Recommend proceeding with MBS to ascertain nature/degree of dysphagia.  D/W RN. Scheduled for 1300 today.   HPI HPI: [name] is a 77 y.o. male with history of hypertension and a previous stroke presenting with right sided weakness and AMS. He did not receive IV t-PA due to late presentation. MRI showed multifocal acute ischemia within the left hemisphere due to severe left carotid stenosis. Pt initially passed stroke swallow screen, referred for swallow evaluation due to MD, RN concerns; MD downgraded to D1/nectar.      SLP Plan  MBS       Recommendations                   Plan: MBS       GO                Carolan Shiver 02/16/2018, 12:25 PM

## 2018-02-16 NOTE — Progress Notes (Signed)
PT Cancellation Note  Patient Details Name: Jorge Burns MRN: [id number] DOB: Jun 24, 1941   Cancelled Treatment:    Reason Eval/Treat Not Completed: Patient at procedure or test/unavailable Attempted to see pt second time today and interpreter present. Pt off unit for test at this time. PT will continue to follow acutely.    Derek Mound, PTA Pager: [phone]   02/16/2018, 1:53 PM

## 2018-02-16 NOTE — Progress Notes (Signed)
STROKE TEAM PROGRESS NOTE          SUBJECTIVE (INTERVAL HISTORY) His friend is at bedside, . He feels that the patient has had neurological worsening and is speaking less and has more right-sided weakness. Stat CT scan of the head was obtained which showed expected evolutionary changes in the left subcortical infarct without any hemorrhage or acute findings.    OBJECTIVE Temp:  [97.7 F (36.5 C)-98.3 F (36.8 C)] 98 F (36.7 C) (05/29 1253) Pulse Rate:  [69-92] 87 (05/29 1253) Cardiac Rhythm: Normal sinus rhythm (05/29 0800) Resp:  [16-18] 16 (05/29 1253) BP: (141-173)/(79-90) 141/79 (05/29 1253) SpO2:  [96 %-99 %] 97 % (05/29 1253)  CBC:  Recent Labs  Lab 02/11/18 1837  02/13/18 0058  02/14/18 0842 02/16/18 0624  WBC 6.1  --  6.6   < > 6.9 9.4  NEUTROABS 2.7  --  3.6  --   --   --   HGB 13.5   < > 12.9*   < > 13.2 13.1  HCT 42.2   < > 39.6   < > 40.4 39.8  MCV 76.0*  --  75.3*   < > 74.5* 74.3*  PLT 238  --  221   < > 247 250   < > = values in this interval not displayed.    Basic Metabolic Panel:  Recent Labs  Lab 02/15/18 0636 02/16/18 0624  NA 139 139  K 4.2 3.8  CL 112* 106  CO2 23 23  GLUCOSE 95 102*  BUN 7 7  CREATININE 1.07 1.06  CALCIUM 8.5* 8.8*    Lipid Panel:     Component Value Date/Time   CHOL 213 (H) 02/13/2018 0855   TRIG 98 02/13/2018 0855   HDL 42 02/13/2018 0855   CHOLHDL 5.1 02/13/2018 0855   VLDL 20 02/13/2018 0855   LDLCALC 151 (H) 02/13/2018 0855   HgbA1c:  Lab Results  Component Value Date   HGBA1C 5.7 (H) 02/13/2018   Urine Drug Screen: No results found for: LABOPIA, COCAINSCRNUR, LABBENZ, AMPHETMU, THCU, LABBARB  Alcohol Level No results found for: [facility]  IMAGING  Dg Chest 2 View 02/13/2018 IMPRESSION:  1. No acute cardiopulmonary abnormalities.  2.  Aortic Atherosclerosis (ICD10-I70.0).     Dg Abd 1 View 02/13/2018 IMPRESSION:  Metallic foreign bodies are demonstrated in the soft tissues over the right hip and  in the left upper quadrant.   Ct Head Wo Contrast 02/12/2018 IMPRESSION:  No acute intracranial abnormalities. Chronic atrophy and small vessel ischemic changes. Old right parietal infarct.    Ct Head Wo Contrast 02/11/2018 IMPRESSION:  1. No definite CT evidence for acute intracranial abnormality.  2. Atrophy and right temporal lobe encephalomalacia.    Mr Brain 69 Contrast Mr Angiogram Head Wo Contrast Mr Angiogram Neck W Or Wo Contrast 02/13/2018 IMPRESSION:  1. Multifocal acute ischemia within the left hemisphere, predominantly within the deep watershed zone and left ACA/MCA cortical watershed zone. No hemorrhage or mass effect.  2. Small subacute infarct within the right-sided white matter adjacent to the corpus callosum splenium.  3. No intracranial occlusion or high-grade stenosis.  4. Loss of enhancement of the proximal left internal carotid artery over a short segment with near-immediate reconstitution. This indicates either critical stenosis or occlusion.  5. 4 x 3 mm rounded focus projecting posteriorly from the proximal right internal carotid artery, most consistent with a small aneurysm. This might be artifactual secondary to the presence of atherosclerotic plaque at the  bifurcation. If clinically warranted, this could be further characterized with CTA of the neck.   Cerebral catheter angiogram 02/15/18 : . 1.95 % plus LT ICA prox stenosis with slow antegrade flow. 2.Approx 35 % stenosis of RT ICA prox with a 5mm smoth ulcerated plaque 3.approx 40 % stenosis of LT VA origin. 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 Wall thickness was   normal. Systolic function was normal. The estimated ejection   fraction was in the range of 50% to 55%. Wall motion was normal;    PHYSICAL EXAM Vitals:   02/15/18 2341 02/16/18 0351 02/16/18 0811 02/16/18 1253  BP: (!) 153/85 (!) 149/81  (!) 173/90 (!) 141/79  Pulse: 80 81 92 87  Resp: Temp: 97.7 F (36.5 C) 97.8 F (36.6 C) 97.7 F (36.5 C) 98 F (36.7 C)  TempSrc: Oral Oral Oral Oral  SpO2: 99% 98% 96% 97%  Weight:      Height:        General -  Heart - Regular rate and rhythm - no murmer appreciated Lungs - Clear to auscultation anteriorly Abdomen - Soft - non tender Extremities - Distal pulses intact - no edema Skin - Warm and dry  Mental Status: Patient is awake and following some commands, not oriented to year or month.speech is nonsensical and difficult to understand severe dysarthria and is quite nonfluent and hesitant Cranial Nerves: II: Discs not visualized; unable to assess visual fields,  pupils equal, round, reactive to light. III,IV, VI: ptosis not present, conjugate gaze V,VII: right lower facial weakness VIII: hearing appears normal to vpice IX,X: +cough and gag XI: bilateral shoulder shrug intact. XII: midline tongue extension Motor: dense right hemiplegia with only mild withdrawal to pain in the right lower extremity. Antigravity normal strength on the left side Tone and bulk:normal tone throughout; no atrophy noted Sensory: grimaces to pain x 4 Deep Tendon Reflexes: symmetric throughout      ASSESSMENT/PLAN [name] is a 77 y.o. male with history of hypertension and a previous stroke presenting with right sided weakness and AMS. He did not receive IV t-PA due to late presentation.  Stroke: Multifocal acute ischemia within the left hemisphere due to severe left carotid stenosis E had neurological worsening today with increase aphasia and dense right hemiplegia but CT scan shows no extension of his stroke. Suspect his high-grade left carotid stenosis may have occluded  Resultant mild hemiparesis, encephalopathy  CT head - No definite CT evidence for acute intracranial abnormality. Old right parietal infarct.   MRI head - Multifocal acute ischemia within the left  hemisphere,  MRA head - unremarkable.  MRA - Neck - . Loss of enhancement of the prox L ICA. 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  Carotid Doppler - MRA / CTA neck  CTA neck critical left ICA stenosis 2D Echo - Left ventricle: The cavity size was normal. Wall thickness was   normal. Systolic function was normal. The estimated ejection    fraction was in the range of 50% to 55%. Wall motion was normal;LDL - 151  HgbA1c - 5.7  VTE prophylaxis - Lovenox Diet Order           DIET DYS 2 Room service appropriate? Yes; Fluid consistency: Nectar Thick  Diet effective now          No antithrombotic prior to admission, now on ASA  and Plavix  Patient counseled to be compliant with his antithrombotic medications  Ongoing aggressive stroke risk factor management  Therapy recommendations:  pending  Disposition:  Pending  Hypertension  Stable . Permissive hypertension (OK if < 220/120) but gradually normalize in 5-7 days . Long-term BP goal normotensive  Hyperlipidemia  Lipid lowering medication PTA:  none  LDL 151, goal < 70  Current lipid lowering medication:  started Lipitor 40 mg daily.  Continue statin at discharge   Other Stroke Risk Factors  Advanced age  Hx of stroke/TIA   Other Active Problems  Mild hypokalemia   Plan / Recommendations   Stroke workup: Criticial CTA stenosis on DUAP cerebral arteriogram Tuesday with Dr. Titus Dubin, at this time BP is > 140, need to keep BP elevated due to critical stenosis  Therapy Follow Up: pending  Disposition: pending  Antiplatelet / Anticoagulation: DUAP  Statin: Lipitor 40 mg daily  MD Follow Up: [facility] in 6-8 weeks  Other: pending  Further risk factor modification per primary care MD: Follow Up 2 weeks   Hospital day # 3  Personally  participated in, made any corrections needed, and agree with history, physical, neuro exam,assessment and plan as stated above.  The patient has  unfortunately neurologically worsened with increase aphasia and dense hemiplegia and I suspect his left carotid have occluded. Unfortunately there is no acute intervention we can do due to his dense neurological deficits at this time.Continue dual antiplatelet therapy and maintain adequate hydration and blood pressure and avoid hypotension. Discuss with patient's friend at the bedside and Dr. Stevphen Meuse.check carotid ultrasound to confirm left carotid occlusion Greater than 50% time during this 35 minute visit was spent on coordination of care and discussion about extension of his stroke  Delia Heady, MD [facility]   To contact Stroke Continuity provider, please refer to [email or web address]. After hours, contact General Neurology

## 2018-02-16 NOTE — Progress Notes (Signed)
*  PRELIMINARY RESULTS* Vascular Ultrasound Aorta and Limited Lower Extremity Arterial Duplex has been completed.  There is no obvious evidence of aortic or popliteal artery aneurysm bilaterally.  02/16/2018 3:33 PM Gertie Fey, BS, RVT, RDCS, RDMS

## 2018-02-16 NOTE — Consult Note (Addendum)
Hospital Consult    Reason for Consult:  Carotid stenosis Requesting Physician:  Pearlean Brownie MRN #:  [id number]  History of Present Illness: This is a 77 y.o. male who presented to the ER on 02/16/18 with right sided weakness, facial weakness and aphasia.  Per the chart, pt also reported headache.  During his workup, he was found to have left carotid artery stenosis.  He had a cerebral angiogram in IR, which revealed a 95% plus stenosis of the left ICA and 35-40% stenosis of the right ICA.  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  VVS is consulted.  He has been placed on aspirin, Plavix and continuing Lipitor.  He had a 2D echo with preserved EF and no evidence of intracardiac thrombus.    Also on CT scan, there is a possible fungus ball vs TB.  IM MD spoke with pulmonary and ID who will evaluate pt.  Per pulmonary, this is less likely TB but will get CT of the chest today.    Unable to obtain information from pt as he is aphasic.  Family friend present.  Most of the information is obtained via the chart.   The friend states that until a few days ago, he was walking and performing his normal activities.  She states he is unable to talk or move his right side.   Past Medical History:  Diagnosis Date  . Stroke [facility])    "a long time ago -- 2 years ago"    Past Surgical History:  Procedure Laterality Date  . IR ANGIO INTRA EXTRACRAN SEL COM CAROTID INNOMINATE BILAT MOD SED  02/15/2018  . IR ANGIO VERTEBRAL SEL SUBCLAVIAN INNOMINATE UNI R MOD SED  02/15/2018  . IR ANGIO VERTEBRAL SEL VERTEBRAL UNI L MOD SED  02/15/2018    No Known Allergies  Prior to Admission medications   Medication Sig Start Date End Date Taking? Authorizing Provider  butalbital-acetaminophen-caffeine (FIORICET, ESGIC) [phone] MG tablet Take 1-2 tablets by mouth every 8 (eight) hours as needed for headache. 02/12/18 02/12/19 Yes Antony Madura, PA-C    Social History   Socioeconomic History  . Marital status: Single    Spouse name: Not on file  . Number of children: Not on file  . Years of education: Not on file  . Highest education level: Not on file  Occupational History  . Not on file  Social Needs  . Financial resource strain: Not on file  . Food insecurity:    Worry: Not on file    Inability: Not on file  . Transportation needs:    Medical: Not on file    Non-medical: Not on file  Tobacco Use  . Smoking status: Never Smoker  . Smokeless tobacco: Never Used  Substance and Sexual Activity  . Alcohol use: No  . Drug use: No  . Sexual activity: Not on file  Lifestyle  . Physical activity:    Days per week: Not on file    Minutes per session: Not on file  . Stress: Not on file  Relationships  . Social connections:    Talks on phone: Not on file    Gets together: Not on file    Attends religious service: Not on file    Active member of club or organization: Not on file    Attends meetings of clubs or organizations: Not on file    Relationship status: Not  on file  . Intimate partner violence:    Fear of current or ex partner: Not on file    Emotionally abused: Not on file    Physically abused: Not on file    Forced sexual activity: Not on file  Other Topics Concern  . Not on file  Social History Narrative  . Not on file     No family history on file.-unable to obtain due to pt being aphasic  ROS:  Positive    Negative    All sytems reviewed and are negative ROS obtained from the chart - see HPI Cardiac:  chest pain/pressure  palpitations  SOB lying flat  DOE  Vascular:  pain in legs while walking  pain in legs at rest  pain in legs at night  non-healing ulcers  hx of DVT  swelling in legs  Pulmonary:  productive cough  asthma/wheezing  home O2  Neurologic:  weakness in  arms  legs  numbness in  arms  legs  hx of CVA   mini stroke difficulty speaking or slurred speech  temporary loss of vision in one eye  dizziness  difficulty swallowing  Hematologic:  hx of cancer  bleeding problems  problems with blood clotting easily  Endocrine:    diabetes  thyroid disease  GI  vomiting blood  blood in stool  GU:  CKD/renal failure  HD--[]  M/W/F or  T/T/S  burning with urination  blood in urine  Psychiatric:  anxiety  depression  Musculoskeletal:  arthritis  joint pain  Integumentary:  rashes  ulcers  Constitutional:  fever  chills   Physical Examination  Vitals:   02/16/18 0351 02/16/18 0811  BP: (!) 149/81 (!) 173/90  Pulse: 81 92  Resp: 18 18  Temp: 97.8 F (36.6 C) 97.7 F (36.5 C)  SpO2: 98% 96%   Body mass index is 20.35 kg/m.  General:  WDWN in NAD Gait: Not observed HENT: WNL, normocephalic Pulmonary: normal non-labored breathing, without Rales, rhonchi,  wheezing Cardiac: regular, without  Murmurs, rubs or gallops; without carotid bruits Abdomen:  soft, NT/ND, aorta is palpable Skin: without rashes Vascular Exam/Pulses:  Right Left  Radial 2+ (normal) 2+ (normal)  Ulnar 1+ (weak) 1+ (weak)  Brachial 2+ (normal) 2+ (normal)  Femoral Not examined Not examined  Popliteal 2+ (normal) 2+ (normal)  DP 1+ (weak) 2+ (normal)  PT Unable to palpate  Unable to palpate    Extremities: without ischemic changes, without Gangrene , without cellulitis; without open wounds;  Musculoskeletal: no muscle wasting or atrophy  Neurologic: A&O; aphasic; right hemiparesis; facial droop present Psychiatric:  Unable to evaluate   CBC    Component Value Date/Time   WBC 9.4 02/16/2018 0624   RBC 5.36 02/16/2018 0624   HGB 13.1 02/16/2018 0624   HCT 39.8 02/16/2018 0624   PLT 250 02/16/2018 0624   MCV 74.3 (L) 02/16/2018 0624   MCH 24.4 (L) 02/16/2018 0624   MCHC 32.9 02/16/2018 0624   RDW 15.6 (H) 02/16/2018 0624   LYMPHSABS  2.1 02/13/2018 0058   MONOABS 0.7 02/13/2018 0058   EOSABS 0.2 02/13/2018 0058   BASOSABS 0.0 02/13/2018 0058    BMET    Component Value Date/Time   NA 139 02/16/2018 0624   K 3.8 02/16/2018 0624   CL 106 02/16/2018 0624   CO2 23 02/16/2018 0624   GLUCOSE 102 (H) 02/16/2018 0624   BUN 7 02/16/2018 0624   CREATININE 1.06 02/16/2018 0624   CALCIUM  8.8 (L) 02/16/2018 0624   GFRNONAA >60 02/16/2018 0624   GFRAA >60 02/16/2018 0624    COAGS: Lab Results  Component Value Date   INR 1.02 02/11/2018     Non-Invasive Vascular Imaging:   CTA neck 02/13/18: IMPRESSION: 50% stenosis RIGHT ICA, with a 3-4 mm posterior projecting ulceration. This could serve as a source of distal emboli.  Critical stenosis LEFT ICA. Diminished caliber of the distal cervical segment. Surgical and/or neuro interventional consultation may be warranted. Findings discussed with neurology PA.  LEFT apical scarring, possible fungus ball, along with pneumomediastinum of uncertain significance. Chronic or acute tuberculous infection cannot completely be excluded. Formal chest CT is warranted for further evaluation.  2D Echocardiogram 02/14/18: Study Conclusions  - Left ventricle: The cavity size was normal. Wall thickness was   normal. Systolic function was normal. The estimated ejection   fraction was in the range of 50% to 55%. Wall motion was normal;   there were no regional wall motion abnormalities. Doppler   parameters are consistent with abnormal left ventricular   relaxation (grade 1 diastolic dysfunction). - Aortic valve: There was trivial regurgitation. - Mitral valve: There was mild regurgitation. - Left atrium: The atrium was mildly dilated. - Pulmonary arteries: Systolic pressure was mildly increased.  Impressions:  - Normal LV systolic function; mild diastolic dysfunction;   sclerotic aortic valve with trace AI; mild MR; mild LAE; mild TR   with mild pulmonary  hypertension.  Cerebral Angiogram 02/15/18: Findings. 1.95 % plus LT ICA prox stenosis with slow antegrade flow. 2.Approx 35 % stenosis of RT ICA prox with a 5mm smoth ulcerated plaque 3.approx 40 % stenosis of LT VA origin. 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   Beta Blocker:  No. Aspirin:  Yes.   ACEI:  No. ARB:  No. CCB use:  No Other antiplatelets/anticoagulants:  Yes.   Plavix,  SQ heparin   ASSESSMENT/PLAN: This is a 77 y.o. male with symptomatic left carotid artery stenosis    -pt with right hemiplegia and aphasic; his left carotid stenosis is > 90% per cerebral angiogram.  Will need TCAR in a few weeks once he has recovered from his stroke. -left pulmonary apical lesion - pt to have CT scan to evaluate today per note-TB vs fungal and less likely TB.  Pt is not on airborne precautions at this time. -aorta is prominently palpable as well as bilateral popliteal arteries.  May need abdominal u/s as well as arterial duplex BLE-d/w Dr. Darrick Penna and will get abdominal u/s as well as u/s of bilateral popliteal arteries to r/u aneurysms.    Doreatha Massed, PA-C [facility] [phone]  History and exam details as above.  Pt now has dense hemiplegia and aphasic.  Exam showed prominent aortic and popliteal pulses no evidence of aneurysm.  Angio CT MRI images reviewed.  90% stenosis left ICA.  This is a high lesion and TCAR stenting would most likely be best treatment.  With his dense neuro deficit will give a few weeks to recover and see if carotid is still patent prior to proceeding.  Continue ASA/Plavix.  Will follow up on repeat carotid duplex ordered in hospital by neuro team  Fabienne Bruns, MD [facility] [place] Office: [phone] Pager: [phone]

## 2018-02-16 NOTE — Telephone Encounter (Signed)
Called to schedule f/u, no answer, left vm. AW 

## 2018-02-16 NOTE — Consult Note (Addendum)
[facility]    Date of Admission:  02/12/2018     Total days of antibiotics                Reason for Consult: Lung Nodule   Referring Provider: [facility] Primary Care Provider: Patient, No Pcp Per   Assessment/Plan:  Jorge Burns is a 77 y/o [place] male with previous history of hypertension and stroke now admitted to the hospital with new left hemisphere deep watershed zone and left ACA/MCA stroke.  Angiogram showing 95% plus Left ICA proximal stenosis. During stroke work up noted to have an 11x11 mm lesion of the left lung further defined on chest CT with 1.6 cm large cavitary lung lesion measuring 1.6 cm deemed non-specific with differentials including tuberculosis, fungal ball or possible malignancy. Pulmonology recommends serial follow up as there does not appear to be any signs of active infection. Primary risk factor for TB would be that he is foreign born from [place].  Information is limited as patient has expressive aphasia in addition to the language barrier.  1. No need for antibiotics/antifungals at this time.  2. Recommend continued follow up if symptoms develop.    Active Problems:   CVA (cerebral vascular accident) (HCC)   Diastolic dysfunction   Prediabetes   . aspirin  81 mg Oral Daily  . atorvastatin  80 mg Oral q1800  . clopidogrel  75 mg Oral Daily  . glycopyrrolate  0.2 mg Intravenous BID  . heparin injection (subcutaneous)  5,000 Units Subcutaneous Q8H     HPI: Jorge Burns is a 77 y.o. male with previous medical history of CVA (2017) and hypertension evaluated in the ED and admitted to the hospital with the chief complaint of right sided weakness. He is [place] and does not speak [place]. His pastor is present and speaks JRAI and assists with translation.   Initial CT scan of the head with no acute abnormalities and old right parietal infarct. During stroke work up he was noted to have scarring and volume loss in the left  hemithorax with a possible fungal ball measuring 11x11 mm within a left apical cavity. Also noted to have pneumomediastinum of uncertain etiology. He denies any previous history of cough, fevers or lung infection.  Examples of tuberculosis risk factors include injection drug use, living in congregate settings, incarceration, or homelessness.   including foreign born, incarcerated, homeless, persons who use drugs, and individuals with HIV infection Hospital Course:  Review of Systems: Review of Systems  Unable to perform ROS: Acuity of condition     Past Medical History:  Diagnosis Date  . Stroke [facility])    "a long time ago -- 2 years ago"    Social History   Tobacco Use  . Smoking status: Never Smoker  . Smokeless tobacco: Never Used  Substance Use Topics  . Alcohol use: No  . Drug use: No    No family history on file.  No Known Allergies  OBJECTIVE: Blood pressure (!) 173/90, pulse 92, temperature 97.7 F (36.5 C), temperature source Oral, resp. rate 18, height  (1.676 m), weight 126 lb 1.7 oz (57.2 kg), SpO2 96 %.  Physical Exam  Constitutional: He is oriented to person, place, and time. He appears well-developed and well-nourished. No distress.  Cardiovascular: Normal rate, regular rhythm, normal heart sounds and intact distal pulses. Exam reveals no gallop and no friction rub.  No murmur heard. Pulmonary/Chest: Effort normal and breath sounds normal. No  stridor. No respiratory distress. He has no wheezes. He has no rales. He exhibits no tenderness.  Abdominal: Soft. Bowel sounds are normal. He exhibits no distension. There is no tenderness.  Neurological: He is alert and oriented to person, place, and time.  Alert. Hemiparesis and hemiparalysis noted on the right side. Appears to have expressive aphasia and possibly dysarthria of speech.   Skin: Skin is warm and dry.  Psychiatric: He has a normal mood and affect. His behavior is normal. Judgment and thought  content normal.    Lab Results Lab Results  Component Value Date   WBC 9.4 02/16/2018   HGB 13.1 02/16/2018   HCT 39.8 02/16/2018   MCV 74.3 (L) 02/16/2018   PLT 250 02/16/2018    Lab Results  Component Value Date   CREATININE 1.06 02/16/2018   BUN 7 02/16/2018   NA 139 02/16/2018   K 3.8 02/16/2018   CL 106 02/16/2018   CO2 23 02/16/2018    Lab Results  Component Value Date   ALT 14 (L) 02/14/2018   AST 16 02/14/2018   ALKPHOS 59 02/14/2018   BILITOT 0.6 02/14/2018     Microbiology: No results found for this or any previous visit (from the past 240 hour(s)).   Marcos Eke, NP [facility] [phone] Pager  02/16/2018  9:54 AM

## 2018-02-16 NOTE — Care Management Important Message (Signed)
Important Message  Patient Details  Name: Jorge Burns MRN: [id number] Date of Birth: [birth year]   Medicare Important Message Given:  Yes    [facility] 02/16/2018, 1:50 PM

## 2018-02-17 ENCOUNTER — Inpatient Hospital Stay (HOSPITAL_COMMUNITY): Payer: Medicare Other

## 2018-02-17 DIAGNOSIS — R4701 Aphasia: Secondary | ICD-10-CM

## 2018-02-17 DIAGNOSIS — G8191 Hemiplegia, unspecified affecting right dominant side: Secondary | ICD-10-CM

## 2018-02-17 DIAGNOSIS — I6300 Cerebral infarction due to thrombosis of unspecified precerebral artery: Secondary | ICD-10-CM

## 2018-02-17 DIAGNOSIS — B49 Unspecified mycosis: Secondary | ICD-10-CM

## 2018-02-17 NOTE — Progress Notes (Addendum)
STROKE TEAM PROGRESS NOTE          SUBJECTIVE (INTERVAL HISTORY) His friend and translator are at bedside, . He feels that the patient has had neurological worsening and is speaking less and has more right-sided weakness. Stat CT scan of the head was obtained on 5/29/19which showed expected evolutionary changes in the left subcortical infarct without any hemorrhage or acute findings.vascular surgery saw the patient and may consider TCAR in a few weeks if the patient may show significant recovery. Repeat carotid ultrasound shows yet persisten tleft ICA flow with high-grade 80-99% stenosis    OBJECTIVE Temp:  [97.7 F (36.5 C)-98 F (36.7 C)] 97.8 F (36.6 C) (05/30 1643) Pulse Rate:  [62-89] 75 (05/30 1643) Cardiac Rhythm: Normal sinus rhythm (05/30 0700) Resp:  [16-18] 18 (05/30 1643) BP: (138-179)/(78-91) 140/91 (05/30 1643) SpO2:  [93 %-98 %] 98 % (05/30 1255)  CBC:  Recent Labs  Lab 02/11/18 1837  02/13/18 0058  02/14/18 0842 02/16/18 0624  WBC 6.1  --  6.6   < > 6.9 9.4  NEUTROABS 2.7  --  3.6  --   --   --   HGB 13.5   < > 12.9*   < > 13.2 13.1  HCT 42.2   < > 39.6   < > 40.4 39.8  MCV 76.0*  --  75.3*   < > 74.5* 74.3*  PLT 238  --  221   < > 247 250   < > = values in this interval not displayed.    Basic Metabolic Panel:  Recent Labs  Lab 02/15/18 0636 02/16/18 0624  NA 139 139  K 4.2 3.8  CL 112* 106  CO2 23 23  GLUCOSE 95 102*  BUN 7 7  CREATININE 1.07 1.06  CALCIUM 8.5* 8.8*    Lipid Panel:     Component Value Date/Time   CHOL 213 (H) 02/13/2018 0855   TRIG 98 02/13/2018 0855   HDL 42 02/13/2018 0855   CHOLHDL 5.1 02/13/2018 0855   VLDL 20 02/13/2018 0855   LDLCALC 151 (H) 02/13/2018 0855   HgbA1c:  Lab Results  Component Value Date   HGBA1C 5.7 (H) 02/13/2018   Urine Drug Screen: No results found for: LABOPIA, COCAINSCRNUR, LABBENZ, AMPHETMU, THCU, LABBARB  Alcohol Level No results found for: [facility]  IMAGING  Dg Chest 2  View 02/13/2018 IMPRESSION:  1. No acute cardiopulmonary abnormalities.  2.  Aortic Atherosclerosis (ICD10-I70.0).     Dg Abd 1 View 02/13/2018 IMPRESSION:  Metallic foreign bodies are demonstrated in the soft tissues over the right hip and in the left upper quadrant.   Ct Head Wo Contrast 02/12/2018 IMPRESSION:  No acute intracranial abnormalities. Chronic atrophy and small vessel ischemic changes. Old right parietal infarct.    Ct Head Wo Contrast 02/11/2018 IMPRESSION:  1. No definite CT evidence for acute intracranial abnormality.  2. Atrophy and right temporal lobe encephalomalacia.    Mr Brain 70 Contrast Mr Angiogram Head Wo Contrast Mr Angiogram Neck W Or Wo Contrast 02/13/2018 IMPRESSION:  1. Multifocal acute ischemia within the left hemisphere, predominantly within the deep watershed zone and left ACA/MCA cortical watershed zone. No hemorrhage or mass effect.  2. Small subacute infarct within the right-sided white matter adjacent to the corpus callosum splenium.  3. No intracranial occlusion or high-grade stenosis.  4. Loss of enhancement of the proximal left internal carotid artery over a short segment with near-immediate reconstitution. This indicates either critical stenosis or occlusion.  5. 4 x 3 mm rounded focus projecting posteriorly from the proximal right internal carotid artery, most consistent with a small aneurysm. This might be artifactual secondary to the presence of atherosclerotic plaque at the bifurcation. If clinically warranted, this could be further characterized with CTA of the neck.   Cerebral catheter angiogram 02/15/18 : . 1.95 % plus LT ICA prox stenosis with slow antegrade flow. 2.Approx 35 % stenosis of RT ICA prox with a 5mm smoth ulcerated plaque 3.approx 40 % stenosis of LT VA origin. 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 Wall thickness  was   normal. Systolic function was normal. The estimated ejection   fraction was in the range of 50% to 55%. Wall motion was normal;    PHYSICAL EXAM Vitals:   02/17/18 0403 02/17/18 0746 02/17/18 1255 02/17/18 1643  BP: (!) 143/81 138/78 (!) 179/90 (!) 140/91  Pulse: 65 62 69 75  Resp: Temp: 97.7 F (36.5 C)  98 F (36.7 C) 97.8 F (36.6 C)  TempSrc: Oral  Oral Oral  SpO2: 93%  98%   Weight:      Height:        General -  Heart - Regular rate and rhythm - no murmer appreciated Lungs - Clear to auscultation anteriorly Abdomen - Soft - non tender Extremities - Distal pulses intact - no edema Skin - Warm and dry  Mental Status: Patient is awake and following some commands, not oriented to year or month.speech is nonsensical and difficult to understand severe dysarthria and is quite nonfluent and hesitant Cranial Nerves: II: Discs not visualized; unable to assess visual fields,  pupils equal, round, reactive to light. III,IV, VI: ptosis not present, conjugate gaze V,VII: right lower facial weakness VIII: hearing appears normal to vpice IX,X: +cough and gag XI: bilateral shoulder shrug intact. XII: midline tongue extension Motor: dense right hemiplegia with only mild withdrawal to pain in the right lower extremity. Antigravity normal strength on the left side Tone and bulk:normal tone throughout; no atrophy noted Sensory: grimaces to pain x 4 Deep Tendon Reflexes: symmetric throughout      ASSESSMENT/PLAN [name] is a 77 y.o. male with history of hypertension and a previous stroke presenting with right sided weakness and AMS. He did not receive IV t-PA due to late presentation.  Stroke: Multifocal acute ischemia within the left hemisphere due to severe left carotid stenosis E had neurological worsening today with increase aphasia and dense right hemiplegia but CT scan shows no extension of his stroke. Suspect his high-grade left carotid stenosis may  have occluded  Resultant mild hemiparesis, encephalopathy  CT head - No definite CT evidence for acute intracranial abnormality. Old right parietal infarct.   MRI head - Multifocal acute ischemia within the left hemisphere,  MRA head - unremarkable.  MRA - Neck - . Loss of enhancement of the prox L ICA. 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  Carotid Doppler - MRA / CTA neck  CTA neck critical left ICA stenosis 2D Echo - Left ventricle: The cavity size was normal. Wall thickness was   normal. Systolic function was normal. The estimated ejection    fraction was in the range of 50% to 55%. Wall motion was normal;LDL - 151  HgbA1c - 5.7  VTE prophylaxis - Lovenox Diet Order           DIET -  DYS 1 Room service appropriate? Yes; Fluid consistency: Nectar Thick  Diet effective now          No antithrombotic prior to admission, now on ASA and Plavix  Patient counseled to be compliant with his antithrombotic medications  Ongoing aggressive stroke risk factor management  Therapy recommendations:  pending  Disposition:  Pending  Hypertension  Stable . Permissive hypertension (OK if < 220/120) but gradually normalize in 5-7 days . Long-term BP goal normotensive  Hyperlipidemia  Lipid lowering medication PTA:  none  LDL 151, goal < 70  Current lipid lowering medication:  started Lipitor 40 mg daily.  Continue statin at discharge   Other Stroke Risk Factors  Advanced age  Hx of stroke/TIA   Other Active Problems  Mild hypokalemia   Plan / Recommendations   Stroke workup: Criticial CTA stenosis on DUAP cerebral arteriogram Tuesday with Dr. Titus Dubin, at this time BP is > 140, need to keep BP elevated due to critical stenosis  Therapy Follow Up: pending  Disposition: pending  Antiplatelet / Anticoagulation: DUAP  Statin: Lipitor 40 mg daily  MD Follow Up: [facility] in 6-8 weeks  Other: pending  Further risk factor modification per primary  care MD: Follow Up 2 weeks   Hospital day # 4     The patient has unfortunately neurologically worsened with increase aphasia and dense hemiplegia and I suspect his left carotid have occluded. Unfortunately there is no acute intervention we can do due to his dense neurological deficits at this time if the patient shows significant neurological improvement may reconsider left carotid revascularization in the subacute phasein 1-2 months..Continue dual antiplatelet therapy and maintain adequate hydration and blood pressure and avoid hypotension. Discussed with patient's friend at the bedside and Dr. Stevphen Meuse. nd Dr. Corliss Skains. Greater than 50% time during this 25 minute visit was spent on coordination of care and discussion about extension of his stroke.Stroke team will sign off. Follow-up as an outpatient in stroke clinic in 6 weeks  Delia Heady, MD [facility]   To contact Stroke Continuity provider, please refer to [email or web address]. After hours, contact General Neurology

## 2018-02-17 NOTE — PMR Pre-admission (Signed)
PMR Admission Coordinator Pre-Admission Assessment  Patient: Jorge Burns is an 77 y.o., male MRN: [id number] DOB: 10-09-1940 Height:  (167.6 cm) Weight: 57.2 kg (126 lb 1.7 oz)             Insurance Information HMO:     PPO:      PCP:      IPA:      80/20:      OTHER: no HMO  PRIMARY: Medicare a and b      Policy#: [id number]      Subscriber: pt Benefits:  Phone #: online Eff. Date: a 03/21/2013 b 11/20/2011     Deduct: $1364      Out of Pocket Max: none      Life Max: none CIR: 100%      SNF: 20 full days Outpatient: 80%     Co-Pay: 20% Home Health: 100%      Co-Pay: none DME: 80%     Co-Pay: 20% Providers: pt choice  SECONDARY: Medicaid not active when called 02/17/18      Policy#: [id number] n      Subscriber: pt  Called medicaid and not active 02/17/18  Medicaid Application Date:       Case Manager:  Disability Application Date:       Case Worker:   Emergency Contact Information Contact Information    Name Relation Home Work [place], [place] Niece [phone]  [phone]     Current Medical History  Patient Admitting Diagnosis:  Multifocal left hemisphere infarct  History of Present Illness: a 77 year old right-handed [place] non-English-speaking male with history of reported CVA 2017 patient not on aspirin or statin drug.  Per chart review lives with reported niece and assistance as needed.  One level home 3 steps to entry.  Independent prior to admission and still working.  Presented 02/13/2018 with headache, right-sided weakness and slurred speech.  Cranial CT scan reviewed unremarkable for acute intracranial process.  Per report, old right parietal infarct.  Patient did not receive TPA.  MRI showed multifocal acute ischemia within the left hemisphere, predominantly within the deep watershed zone and left ACA/MCA.  Small subacute infarct within the right side white matter adjacent to the corpus callosum.  MRA of the head with no intracranial occlusion or high-grade stenosis.   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  CTA of head and neck showed 50% right ICA with a 3 to 4 mm posterior projecting ulceration.  Critical stenosis left ICA.  Interventional radiology consulted for cerebral angiogram that showed 95% plus LT ICA proximal stenosis with slow antegrade flow.  Approximately 35% stenosis of right ICA proximal with a 5 mm ulcerated plaque.  Echocardiogram with ejection fraction of 55% grade 1 diastolic dysfunction.  EEG was negative.  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  Presently on aspirin and Plavix for CVA prophylaxis.  Subcutaneous heparin for DVT prophylaxis.  Dysphasia #1 nectar thick liquid diet.  Physical and occupational therapy evaluations completed with recommendations of physical medicine rehab consult.  Total: 14 NIHSS  Past Medical History  Past Medical History:  Diagnosis Date  . Stroke [facility])    "a long time ago -- 2 years ago"    Family History  family history is not on file.  Prior Rehab/Hospitalizations:  Has the patient had major surgery during 100 days prior to admission? No  Current Medications   Current Facility-Administered Medications:  .  0.9 %  sodium chloride infusion, ,  Intravenous, Continuous, Emokpae, Courage, MD, Last Rate: 100 mL/hr at 02/18/18 0441 .  acetaminophen (TYLENOL) tablet 650 mg, 650 mg, Oral, Q4H PRN **OR** acetaminophen (TYLENOL) solution 650 mg, 650 mg, Per Tube, Q4H PRN **OR** acetaminophen (TYLENOL) suppository 650 mg, 650 mg, Rectal, Q4H PRN, Hugelmeyer, Alexis, DO .  aspirin chewable tablet 81 mg, 81 mg, Oral, Daily, Emokpae, Courage, MD, 81 mg at 02/17/18 1003 .  atorvastatin (LIPITOR) tablet 80 mg, 80 mg, Oral, q1800, Emokpae, Courage, MD, 80 mg at 02/17/18 1805 .  clopidogrel (PLAVIX) tablet 75 mg, 75 mg, Oral, Daily, Emokpae, Courage, MD, 75 mg at 02/17/18 1003 .  fentaNYL (SUBLIMAZE) injection, , Intravenous, PRN, Julieanne Cotton, MD, 25 mcg at 02/15/18 1111 .   glycopyrrolate (ROBINUL) injection 0.2 mg, 0.2 mg, Intravenous, BID, Emokpae, Courage, MD, 0.2 mg at 02/17/18 2148 .  heparin injection 5,000 Units, 5,000 Units, Subcutaneous, Q8H, Emokpae, Courage, MD, 5,000 Units at 02/18/18 0601 .  heparin injection, , , PRN, Julieanne Cotton, MD, 1,000 Units at 02/15/18 1121 .  hydrALAZINE (APRESOLINE) injection 10 mg, 10 mg, Intravenous, Q6H PRN, Mariea Clonts, Courage, MD .  hydrALAZINE (APRESOLINE) injection, , , PRN, Julieanne Cotton, MD, 5 mg at 02/15/18 1203 .  lidocaine (XYLOCAINE) 1 % (with pres) injection, , Infiltration, PRN, Deveshwar, Sanjeev, MD, 10 mL at 02/15/18 1113 .  midazolam (VERSED) injection, , Intravenous, PRN, Deveshwar, Sanjeev, MD, 1 mg at 02/15/18 1111 .  senna-docusate (Senokot-S) tablet 1 tablet, 1 tablet, Oral, QHS PRN, Hugelmeyer, Alexis, DO, 1 tablet at 02/16/18 2210  Patients Current Diet:  Diet Order           DIET - DYS 1 Room service appropriate? Yes; Fluid consistency: Nectar Thick  Diet effective now          Precautions / Restrictions Precautions Precautions: Fall Restrictions Weight Bearing Restrictions: No   Has the patient had 2 or more falls or a fall with injury in the past year?No  Prior Activity Level Community (5-7x/wk): independent and active pta; no AD  Journalist, newspaper / Equipment Home Equipment: None  Prior Device Use: Indicate devices/aids used by the patient prior to current illness, exacerbation or injury? None of the above  Prior Functional Level Prior Function Level of Independence: Independent 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  Self Care: Did the patient need help bathing, dressing, using the toilet or eating?  Independent  Indoor Mobility: Did the patient need assistance with walking from room to room (with or without device)? independent  Stairs: Did the patient need assistance with internal or external stairs (with or without device)?  Independent  Functional Cognition: Did the patient need help planning regular tasks such as shopping or remembering to take medications? Independent  Current Functional Level Cognition  Arousal/Alertness: Awake/alert Overall Cognitive Status: Impaired/Different from baseline Orientation Level: Oriented to person Following Commands: Follows one step commands inconsistently, Follows one step commands with increased time Safety/Judgement: Decreased awareness of safety, Decreased awareness of deficits General Comments: family members present and interpreted during session; pt did not try to verbalize answers to family member and most of the time did not nod head yes/no when asked however pt did assist with tasks without cues  Attention: Focused Focused Attention: Appears intact    Extremity Assessment (includes Sensation/Coordination)  Upper Extremity Assessment: RUE deficits/detail RUE Deficits / Details: impaired tone, minimal tone exhibited during PROM in seated position.  RUE Coordination: decreased fine motor, decreased gross motor  Lower Extremity Assessment: Defer to PT  evaluation RLE Deficits / Details: lifts antigravity with increased time, holds knee extension to some resistance; does note attend well to R side RLE Coordination: decreased gross motor    ADLs  Overall ADL's : Needs assistance/impaired Eating/Feeding: NPO Grooming: Moderate assistance, Maximal assistance, Sitting Grooming Details (indicate cue type and reason): difficulty with cognitive piece of grooming task this session Upper Body Bathing: Moderate assistance, Sitting Lower Body Bathing: Maximal assistance Lower Body Bathing Details (indicate cue type and reason): min A +2 sit<>stand Upper Body Dressing : Maximal assistance, Sitting Lower Body Dressing: Total assistance Lower Body Dressing Details (indicate cue type and reason): min A +2 sit<>stand Toilet Transfer: Moderate assistance, +2 for physical  assistance, Stand-pivot Toilet Transfer Details (indicate cue type and reason): Bil HHA; simulated with EOB>recliner Toileting- Clothing Manipulation and Hygiene: Total assistance Toileting - Clothing Manipulation Details (indicate cue type and reason): mod A +2 sit<>stand General ADL Comments: Pt completed bed mobility, pericare, 2x sit<>stand then SPT to recliner as detailed above.     Mobility  Overal bed mobility: Needs Assistance Bed Mobility: Sit to Supine Supine to sit: Mod assist Sit to supine: Mod assist General bed mobility comments: assistance needed to bring R LE into bed and for postioning     Transfers  Overall transfer level: Needs assistance Equipment used: 2 person hand held assist Transfer via Lift Equipment: Stedy Transfers: Sit to/from Stand Sit to Stand: Mod assist, +2 physical assistance, Min assist Stand pivot transfers: Mod assist, +2 physical assistance, Max assist General transfer comment: pt stood X3 once with with +2 HHA and next 2 trials with [facility]; pt required less assistance for stand using Stedy; assistance needed for balance in standing due to R lateral lean     Ambulation / Gait / Stairs / Wheelchair Mobility  Ambulation/Gait Ambulation/Gait assistance: Min assist, +2 physical assistance Ambulation Distance (Feet): 40 Feet Assistive device: 2 person hand held assist Gait Pattern/deviations: Step-through pattern, Decreased stride length, Decreased weight shift to right, Drifts right/left General Gait Details: leads with L side of his body needs assist for heavy R arm and for balance (falls to R and back without UE support)    Posture / Balance Dynamic Sitting Balance Sitting balance - Comments: close min guard to min A, static sitting Balance Overall balance assessment: Needs assistance Sitting-balance support: Single extremity supported, Feet supported Sitting balance-Leahy Scale: Fair Sitting balance - Comments: close min guard to min A, static  sitting Postural control: Posterior lean, Right lateral lean Standing balance support: Bilateral upper extremity supported Standing balance-Leahy Scale: Poor Standing balance comment: R lateral lean; pt able to correct standing posture to midline with mutlimocal cues    Special needs/care consideration BiPAP/CPAP  N/a CPM  N/a Continuous Drip IV  N/a Dialysis  N/a Life Vest  N/a Oxygen  N/a Special Bed  N/a Trach Size  N/a Wound Vac n/a Skin N/a                               Bowel mgmt: incontinent, Last BM 02/17/18 Bladder mgmt: incontinent; external catheter Diabetic mgmt Hgb A1c 5.7    Previous Home Environment Living Arrangements: Other (Comment)(lives with his "niece" [name] and her family; she call him [place]  Lives With: Friend(s) Available Help at Discharge: ([name] states when she is in class in the morning, she will hav) Type of Home: House Home Layout: One level Home Access: Stairs to enter [employer] of  Steps: 3 Bathroom Shower/Tub: Health visitor: Standard Bathroom Accessibility: Yes How Accessible: Accessible via walker Home Care Services: No  [name] calls him Grandpa. When she is in class, she will have friends to stay with him. Class 0830 until 1 Monday through Thursday  Discharge Living Setting Plans for Discharge Living Setting: Lives with (comment)(friends, [name] and her family) Type of Home at Discharge: House Discharge Home Layout: One level Discharge Home Access: Stairs to enter Entrance Stairs-Rails: None Entrance Stairs-Number of Steps: 3 Discharge Bathroom Shower/Tub: Garment/textile technologist: Standard Discharge Bathroom Accessibility: Yes How Accessible: Accessible via walker Does the patient have any problems obtaining your medications?: No  Social/Family/Support Systems Patient Roles: Adult nurse Information: [name], "niece" Anticipated Caregiver: [name] and friends Anticipated Caregiver's Contact  Information: see above Ability/Limitations of Caregiver: [name] is in class 830 until 1 monday through Thursday Caregiver Availability: 24/7([name] will get friends to assist) Discharge Plan Discussed with Primary Caregiver: Yes Is Caregiver In Agreement with Plan?: Yes Does Caregiver/Family have Issues with Lodging/Transportation while Pt is in Rehab?: No  Goals/Additional Needs Patient/Family Goal for Rehab: supervision to min assist with PT, OT, and SLP Expected length of stay: [facility]OS 10- 14 days Cultural Considerations: [place] needs interpreter Special Service Needs: Needs interpreter Pt/Family Agrees to Admission and willing to participate: Yes Program Orientation Provided & Reviewed with Pt/Caregiver Including Roles  & Responsibilities: Yes  Decrease burden of Care through IP rehab admission: n/a  Possible need for SNF placement upon discharge: not anticipated  Patient Condition: This patient's medical and functional status has changed since the consult dated: 02/15/18 in which the Rehabilitation Physician determined and documented that the patient's condition is appropriate for intensive rehabilitative care in an inpatient rehabilitation facility. See "History of Present Illness" (above) for medical update. Functional changes are: Currently requiring mod assist +2 for transfers. Patient's medical and functional status update has been discussed with the Rehabilitation physician and patient remains appropriate for inpatient rehabilitation. Will admit to inpatient rehab today.  Preadmission Screen Completed By: Ottie Glazier, RN, MSN with updates by Trish Mage, 02/18/2018 11:02 AM ______________________________________________________________________   Discussed status with Dr. Riley Kill on 02/18/18 at 1105 and received telephone approval for admission today.  Admission Coordinator:  Trish Mage, time 1105/Date 02/18/18

## 2018-02-17 NOTE — Progress Notes (Signed)
Occupational Therapy Treatment Patient Details Name: Jorge Burns MRN: [id number] DOB: 07-10-1941 Today's Date: 02/17/2018    History of present illness Jorge Burns is a 77 y.o. male with a known history of CVA in 2017, HTN presents to the emergency department for evaluation of facial and right sided weakness associated with headache and slurred speech which began today. MRI: Multifocal acute ischemia within the left hemisphere,predominantly within the deep watershed zone and left ACA/MCA cortical watershed zone. No hemorrhage or mass effect. Small subacute infarct within the right-sided white matter adjacent to the corpus callosum splenium.   OT comments  Pt progressing towards acute OT goals. Focus of session was bed mobility, grooming task, and simulated toilet transfer (EOB>recliner). Pt mod to max +2 physical assist for transfers this session. Interpretive services and family visitor present during session. Pt still exhibiting R side visual cut and impaired recepetive/expressive communication and cognition. D/c plan to CIR remains very appropriate.     Follow Up Recommendations  CIR;Supervision/Assistance - 24 hour    Equipment Recommendations  Other (comment)(TBD next venue)    Recommendations for Other Services      Precautions / Restrictions Precautions Precautions: Fall Restrictions Weight Bearing Restrictions: No       Mobility Bed Mobility Overal bed mobility: Needs Assistance Bed Mobility: Supine to Sit     Supine to sit: Mod assist     General bed mobility comments: cues for sequencing, assist to advance RLE and trunk. HOB slightly elevated  Transfers Overall transfer level: Needs assistance Equipment used: 2 person hand held assist Transfers: Sit to/from [employer] Sit to Stand: Mod assist;+2 physical assistance Stand pivot transfers: Mod assist;+2 physical assistance;Max assist       General transfer comment: EOB>recliner. fatigued quickly  during pivoting    Balance Overall balance assessment: Needs assistance Sitting-balance support: Single extremity supported;Feet supported Sitting balance-Leahy Scale: Fair Sitting balance - Comments: close min guard to min A, static sitting Postural control: Posterior lean;Right lateral lean Standing balance support: Bilateral upper extremity supported Standing balance-Leahy Scale: Poor Standing balance comment: 2 person HHA                           ADL either performed or assessed with clinical judgement   ADL Overall ADL's : Needs assistance/impaired     Grooming: Moderate assistance;Maximal assistance;Sitting Grooming Details (indicate cue type and reason): difficulty with cognitive piece of grooming task this session                 Toilet Transfer: Moderate assistance;+2 for physical assistance;Stand-pivot Toilet Transfer Details (indicate cue type and reason): Bil HHA; simulated with EOB>recliner Toileting- Clothing Manipulation and Hygiene: Total assistance Toileting - Clothing Manipulation Details (indicate cue type and reason): mod A +2 sit<>stand       General ADL Comments: Pt completed bed mobility, pericare, 2x sit<>stand then SPT to recliner as detailed above.      Vision   Additional Comments: Pt still exhibiting signs of R visual field cut.    Perception     Praxis      Cognition Arousal/Alertness: Awake/alert Behavior During Therapy: Flat affect Overall Cognitive Status: Impaired/Different from baseline Area of Impairment: Following commands;Safety/judgement;Problem solving                       Following Commands: Follows one step commands inconsistently;Follows one step commands with increased time Safety/Judgement: Decreased awareness of safety;Decreased awareness of deficits  Problem Solving: Slow processing;Decreased initiation;Difficulty sequencing;Requires verbal cues;Requires tactile cues General Comments: 2x  completed simple command (close your eyes, raise your left arm). did not provide verbal response to "How are your feeling?", trialed 3x with interpreter utilized. Offered wet washcloth at midline and then right side and demostrated and interpreter utilized to cue pt to wash face. pt with decreased initiation to this task. Washcloth placed in left hand hand assisted into position at face level with pt holding the position but not begining task.         Exercises     Shoulder Instructions       General Comments Interperative services and family visitor present during session .    Pertinent Vitals/ Pain       Pain Assessment: Faces Faces Pain Scale: Hurts a little bit Pain Location: unspecified, during transfer Pain Descriptors / Indicators: Grimacing Pain Intervention(s): Monitored during session  Home Living                                          Prior Functioning/Environment              Frequency  Min 3X/week        Progress Toward Goals  OT Goals(current goals can now be found in the care plan section)  Progress towards OT goals: Progressing toward goals  Acute Rehab OT Goals Patient Stated Goal: pt unable to state, family agreeable to rehab OT Goal Formulation: With family Time For Goal Achievement: 02/28/18 Potential to Achieve Goals: Good ADL Goals Pt Will Perform Grooming: with min assist;sitting Pt Will Transfer to Toilet: with min assist;ambulating;bedside commode Pt/caregiver will Perform Home Exercise Program: Increased ROM;Increased strength;Right Upper extremity;With written HEP provided Additional ADL Goal #1: Pt will be able to track past midline to right Additional ADL Goal #2: Pt will be able to follow 1 step commands 25% of time  Plan Discharge plan remains appropriate    Co-evaluation                 AM-PAC PT "6 Clicks" Daily Activity     Outcome Measure   Help from another person eating meals?: Total Help from  another person taking care of personal grooming?: A Lot Help from another person toileting, which includes using toliet, bedpan, or urinal?: A Lot Help from another person bathing (including washing, rinsing, drying)?: A Lot Help from another person to put on and taking off regular upper body clothing?: A Lot Help from another person to put on and taking off regular lower body clothing?: Total 6 Click Score: 10    End of Session Equipment Utilized During Treatment: Gait belt  OT Visit Diagnosis: Unsteadiness on feet (R26.81);Other abnormalities of gait and mobility (R26.89);Muscle weakness (generalized) (M62.81);Low vision, both eyes (H54.2);Other symptoms and signs involving cognitive function;Cognitive communication deficit (R41.841) Symptoms and signs involving cognitive functions: Cerebral infarction   Activity Tolerance Patient tolerated treatment well   Patient Left in chair;with call bell/phone within reach;with family/visitor present(no chair alarm box available)   Nurse Communication Other (comment);Mobility status(no chair alarm box available)        Time: 1111-1140 OT Time Calculation (min): 29 min  Charges: OT General Charges $OT Visit: 1 Visit OT Treatments $Self Care/Home Management : 23-37 mins     Pilar Grammes 02/17/2018, 12:17 PM

## 2018-02-17 NOTE — Progress Notes (Addendum)
Inpatient Rehabilitation Admissions Coordinator  Await medical workup completion to admit pt to CIR. "niece" [name] stated yesterday that she has a meeting for Friday at 10 am with someone medically to discuss his case and medical plans. Roderic Palau will follow up tomorrow in my absence She can be reached at [phone].  Ottie Glazier, RN, MSN Rehab Admissions Coordinator [phone] 02/17/2018 2:03 PM

## 2018-02-17 NOTE — Progress Notes (Signed)
PT Cancellation Note  Patient Details Name: Pawan Knechtel MRN: [id number] DOB: 07/30/1941   Cancelled Treatment:    Reason Eval/Treat Not Completed: Patient at procedure or test/unavailable Pt off unit for procedure. PT will continue to follow acutely.    Derek Mound, PTA Pager: [phone]   02/17/2018, 9:11 AM

## 2018-02-17 NOTE — Progress Notes (Signed)
PROGRESS NOTE  Jorge Burns UEA:[id number] DOB: Nov 22, 1940 DOA: 02/12/2018 PCP: Patient, No Pcp Per   LOS: 4 days   Brief Narrative / Interim history: 77 year old male with history of prior CVA, hypertension, was admitted to the hospital on 5/26 with right-sided weakness, facial droop, headache and slurred speech which began the same day.  He was evaluated in the ED the day prior for migraines.  He had persistent symptoms the next day, and he was found to have acute left ACA/MCA strokes and was admitted to the hospital.  Neurology was consulted  Assessment & Plan: Active Problems:   CVA (cerebral vascular accident) (HCC)   Diastolic dysfunction   Prediabetes   Aphasic disturbance   Right sided weakness   Pulmonary fungal infection   Acute CVA -On admission underwent an MRI which showed multifocal acute ischemia within the left hemisphere, predominantly within the deep watershed zone and left ACA/MCA cortical watershed zone without hemorrhage or mass-effect.  He also has a small subacute infarct within the right sided white matter adjacent to the corpus callosum splenium.  Vascular studies with CT angiogram showed 50% stenosis right ICA and critical stenosis left ICA.  Neuro IR was consulted and patient underwent cerebral angiogram on 5/28 which confirmed a 95% left ICA stenosis. -Appreciate neurology evaluation, recommending vascular evaluation, awaiting input -Continue aspirin and Plavix, continue Lipitor -2D echo with preserved EF, no evidence of intracardiac thrombus -A1c was 5.7, LDL is elevated 151, continue statin -On dysphagia 2 diet per speech eval -Remains nonverbal  Left upper lung apical scaring -Possible fungus ball versus TB based on the CT scan, will obtain a dedicated CT chest today, discussed with Dr. Vassie Loll with pulmonology as well as infectious disease  -Appears chronic scarring, no active symptoms, no further work-up at this point   DVT prophylaxis: heparin Code Status:  Full code Family Communication: family at bedside Disposition Plan: CIR vs SNF  Consultants:   Neurology  ID  Pulmonary  Procedures:  2D echo: Impressions: - Normal LV systolic function; mild diastolic dysfunction; sclerotic aortic valve with trace AI; mild MR; mild LAE; mild TR with mild pulmonary hypertension.   02/13/18 CTA neck IMPRESSION: 50% stenosis RIGHT ICA, with a 3-4 mm posterior projecting ulceration. This could serve as a source of distal emboli. Critical stenosis LEFT ICA. Diminished caliber of the distal cervical segment.  02/13/18 MRI Brain 1. Multifocal acute ischemia within the left hemisphere, predominantly within the deep watershed zone and left ACA/MCA cortical watershed zone. No hemorrhage or mass effect. 2. Small subacute infarct within the right-sided white matter adjacent to the corpus callosum splenium.  Cerebral catheter angiogram5/28/19 : . 1)95 % plus LT ICA prox stenosis with slow antegrade flow. 2)Approx 35 % stenosis of RT ICA prox with a 5mm smoth ulcerated plaque 3)Approx 40 % stenosis of LT VA origin. 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   Antimicrobials:  None   Subjective: -Nonverbal  Objective: Vitals:   02/17/18 0004 02/17/18 0403 02/17/18 0746 02/17/18 1255  BP: (!) 152/87 (!) 143/81 138/78 (!) 179/90  Pulse: 89 65 62 69  Resp: Temp: 98 F (36.7 C) 97.7 F (36.5 C)  98 F (36.7 C)  TempSrc: Oral Oral  Oral  SpO2: 96% 93%  98%  Weight:      Height:        Intake/Output Summary (Last 24 hours) at 02/17/2018 1539 Last data filed at 02/17/2018 0407 Gross per  24 hour  Intake 120 ml  Output 1250 ml  Net -1130 ml   Filed Weights   02/12/18 2322 02/13/18 1336 02/13/18 1434  Weight: 63.5 kg (140 lb) 63.5 kg (140 lb) 57.2 kg (126 lb 1.7 oz)    Examination:  Constitutional: NAD Respiratory: CTA Cardiovascular: RRR  Data Reviewed: I have independently reviewed following labs and imaging  studies   CBC: Recent Labs  Lab 02/11/18 1837  02/13/18 0058 02/13/18 0102 02/13/18 0855 02/14/18 0842 02/16/18 0624  WBC 6.1  --  6.6  --  6.2 6.9 9.4  NEUTROABS 2.7  --  3.6  --   --   --   --   HGB 13.5   < > 12.9* 13.3 13.6 13.2 13.1  HCT 42.2   < > 39.6 39.0 41.4 40.4 39.8  MCV 76.0*  --  75.3*  --  73.7* 74.5* 74.3*  PLT 238  --  221  --  247 247 250   < > = values in this interval not displayed.   Basic Metabolic Panel: Recent Labs  Lab 02/11/18 1837  02/13/18 0058 02/13/18 0102 02/13/18 1610 02/14/18 9604 02/15/18 0636 02/16/18 0624  NA 138   < > 138 142  --  140 139 139  K 3.8   < > 3.3* 3.4*  --  4.1 4.2 3.8  CL 103   < > 107 104  --  111 112* 106  CO2 25  --  25  --   --  GLUCOSE 113*   < > 112* 109*  --  94 95 102*  BUN 23*   < > 17 20  --  CREATININE 1.57*   < > 1.55* 1.50* 1.07 1.10 1.07 1.06  CALCIUM 9.1  --  8.3*  --   --  8.5* 8.5* 8.8*   < > = values in this interval not displayed.   GFR: Estimated Creatinine Clearance: 48.7 mL/min (by C-G formula based on SCr of 1.06 mg/dL). Liver Function Tests: Recent Labs  Lab 02/11/18 1837 02/13/18 0058 02/14/18 0842  AST ALT 17 15* 14*  ALKPHOS 74 58 59  BILITOT 0.4 0.4 0.6  PROT 7.8 6.8 6.7  ALBUMIN 3.7 3.1* 3.2*   No results for input(s): LIPASE, AMYLASE in the last 168 hours. No results for input(s): AMMONIA in the last 168 hours. Coagulation Profile: Recent Labs  Lab 02/11/18 1837  INR 1.02   Cardiac Enzymes: Recent Labs  Lab 02/13/18 0855 02/13/18 1439 02/13/18 2007  TROPONINI <0.03 <0.03 <0.03   BNP (last 3 results) No results for input(s): PROBNP in the last 8760 hours. HbA1C: No results for input(s): HGBA1C in the last 72 hours. CBG: No results for input(s): GLUCAP in the last 168 hours. Lipid Profile: No results for input(s): CHOL, HDL, LDLCALC, TRIG, CHOLHDL, LDLDIRECT in the last 72 hours. Thyroid Function Tests: No results for input(s): TSH,  T4TOTAL, FREET4, T3FREE, THYROIDAB in the last 72 hours. Anemia Panel: No results for input(s): VITAMINB12, FOLATE, FERRITIN, TIBC, IRON, RETICCTPCT in the last 72 hours. Urine analysis:    Component Value Date/Time   COLORURINE STRAW (A) 02/13/2018 0005   APPEARANCEUR CLEAR 02/13/2018 0005   LABSPEC 1.017 02/13/2018 0005   PHURINE 6.0 02/13/2018 0005   GLUCOSEU NEGATIVE 02/13/2018 0005   HGBUR NEGATIVE 02/13/2018 0005   BILIRUBINUR NEGATIVE 02/13/2018 0005   KETONESUR NEGATIVE 02/13/2018 0005   PROTEINUR NEGATIVE 02/13/2018 0005  NITRITE NEGATIVE 02/13/2018 0005   LEUKOCYTESUR NEGATIVE 02/13/2018 0005   Sepsis Labs: Invalid input(s): PROCALCITONIN, LACTICIDVEN  No results found for this or any previous visit (from the past 240 hour(s)).    Radiology Studies: Ct Head Wo Contrast  Result Date: 02/16/2018 CLINICAL DATA:  77 year old male with patchy acute left hemisphere infarcts found to have critical left ICA stenosis. EXAM: CT HEAD WITHOUT CONTRAST TECHNIQUE: Contiguous axial images were obtained from the base of the skull through the vertex without intravenous contrast. COMPARISON:  Diagnostic cerebral angiogram 02/15/2018. Brain MRI and CTA head and neck 02/13/2018. Head CT 02/12/2018. FINDINGS: Brain: Progressed patchy hypodensity in the left MCA and left MCA/ACA watershed territory corresponding to areas of restricted diffusion on 02/13/2018. No associated hemorrhage or mass effect. Superimposed chronic posterior right temporal lobe and right parietal lobe encephalomalacia. No new cortically based infarct identified. Stable ventricle size and configuration. No intracranial mass effect Vascular: Calcified atherosclerosis at the skull base. No suspicious intracranial vascular hyperdensity. Skull: No acute osseous abnormality identified. Sinuses/Orbits: Stable paranasal sinuses. Other: Stable orbit and scalp soft tissues. IMPRESSION: 1. Expected evolution of the Left MCA and MCA/ACA  watershed territory infarcts seen on 02/13/2018 with no associated hemorrhage or mass effect. 2. Chronic right MCA infarcts. 3. No new intracranial abnormality. Electronically Signed   By: Odessa Fleming M.D.   On: 02/16/2018 15:04   Ct Chest Wo Contrast  Result Date: 02/16/2018 CLINICAL DATA:  Pneumonia.  Nonproductive cough. EXAM: CT CHEST WITHOUT CONTRAST TECHNIQUE: Multidetector CT imaging of the chest was performed following the standard protocol without IV contrast. COMPARISON:  None. FINDINGS: Cardiovascular: Atherosclerotic calcifications of the ascending aorta, aortic arch, and descending thoracic aorta are noted. There is no evidence of acute intramural hematoma within the aorta. Maximal diameter of the ascending aorta is 3.4 cm. Minimal coronary artery calcification. Mild aortic valve calcifications. Mediastinum/Nodes: No pericardial effusion. No abnormal mediastinal adenopathy. Large densely calcified nodule in the left lobe of the thyroid gland is noted. Esophagus is within normal limits. Lungs/Pleura: No pneumothorax. No pleural effusion. There are dense calcifications along the pleura of the posterior left lower hemithorax associated with mild pleural thickening. There is minimal pleural thickening and calcification in the posterior right hemithorax. There is a 1.6 x 3.4 cm cavitary lesion at the left lung apex on image 30 containing a lobulated 1.6 cm round density. This may represent a fungus ball within a cavitary lung lesion or possibly a 1.6 cm nodule within a cavitary mass. There are associated linear opacities. There are a number of satellite nodules in the left upper lobe. 8 mm nodule in the left upper lobe on image 33. 5 mm nodule in the left upper lobe on image 42. There is also a small spiculated nodule in the right upper lobe measuring 7 mm on image 34. Scattered linear scarring in the lungs. There are linear opacities and reticulonodular opacities towards the lung bases left greater than  right. There is some bronchiolectasis. There is some mucoid filling of left lower lobe airways. Upper Abdomen: No acute abnormality. Musculoskeletal: No vertebral compression deformity. IMPRESSION: There is a large cavitary lung lesion at the left apex containing a 1.6 cm nodular density. This finding is nonspecific. Differential diagnosis includes reactivation tuberculosis, a fungal ball within a cavitary lung lesion, or malignancy. Correlate clinically as for the need for further imaging and sputum analysis. PET-CT may be helpful. There are several small satellite nodules in the left upper lobe. There is a 7 mm  spiculated nodule in the right upper lobe. Malignancy is not excluded. Reticulonodular opacities at the left base may represent an inflammatory process. There is associated mucoid filling of small airways at the left base and bronchiolectasis. Large densely calcified nodule in the left lobe of the thyroid gland. Aortic Atherosclerosis (ICD10-I70.0). Electronically Signed   By: Jolaine Click M.D.   On: 02/16/2018 14:45   Dg Swallowing Func-speech Pathology  Result Date: 02/16/2018 Objective Swallowing Evaluation: Type of Study: MBS-Modified Barium Swallow Study  Patient Details Name: Jorge Burns MRN: [id number] Date of Birth: [birth year] Today's Date: 02/16/2018 Time: SLP Start Time (ACUTE ONLY): 1340 -SLP Stop Time (ACUTE ONLY): 1400 SLP Time Calculation (min) (ACUTE ONLY): 20 min Past Medical History: Past Medical History: Diagnosis Date . Stroke [facility])   "a long time ago -- 2 years ago" Past Surgical History: Past Surgical History: Procedure Laterality Date . IR ANGIO INTRA EXTRACRAN SEL COM CAROTID INNOMINATE BILAT MOD SED  02/15/2018 . IR ANGIO VERTEBRAL SEL SUBCLAVIAN INNOMINATE UNI R MOD SED  02/15/2018 . IR ANGIO VERTEBRAL SEL VERTEBRAL UNI L MOD SED  02/15/2018 HPI: Jorge Burns is a 77 y.o. male with history of hypertension and a previous stroke presenting with right sided weakness and AMS. He did not  receive IV t-PA due to late presentation. MRI showed multifocal acute ischemia within the left hemisphere due to severe left carotid stenosis. Pt initially passed stroke swallow screen, referred for swallow evaluation due to MD, RN concerns; MD downgraded to D1/nectar.  Repeat MRI 5/29 Expected evolution of the Left MCA and MCA/ACA watershed territory infarcts seen on 02/13/2018 with no associated hemorrhage or mass effect.  Subjective: Alert; interpretor present for study Assessment / Plan / Recommendation CHL IP CLINICAL IMPRESSIONS 02/16/2018 Clinical Impression Pt presents with a moderate sensorimotor dysphagia marked by decreased bolus control/cohesion with anterior loss and right buccal residue post-swallow; decreased pharyngeal squeeze and reduced tongue base contact with pharyngeal walls, leading to vallecular residue post-swallow; silent aspiration of thin liquids with material entering posterior larynx/trachea before onset of pharyngeal swallow.  Due to aphasia, despite interpreter present to assist with communication, pt unable to follow commands for postural adjustments/swallow modifications.  Recommend continuing downgrading diet to dysphagia 1 for now; continue nectar thick liquids; give meds crushed in puree due to residue.  SLP will follow for safety/education and diet progression.  Interpreter relayed results of study to pt - aphasia compromises pt's comprehension.  SLP Visit Diagnosis Dysphagia, oropharyngeal phase (R13.12) Attention and concentration deficit following -- Frontal lobe and executive function deficit following -- Impact on safety and function Mild aspiration risk;Moderate aspiration risk   CHL IP TREATMENT RECOMMENDATION 02/16/2018 Treatment Recommendations Therapy as outlined in treatment plan below   Prognosis 02/16/2018 Prognosis for Safe Diet Advancement Good Barriers to Reach Goals Language deficits;Cognitive deficits Barriers/Prognosis Comment -- CHL IP DIET RECOMMENDATION  02/16/2018 SLP Diet Recommendations Dysphagia 1 (Puree) solids;Nectar thick liquid Liquid Administration via Cup Medication Administration Crushed with puree Compensations Slow rate;Small sips/bites;Minimize environmental distractions;Lingual sweep for clearance of pocketing Postural Changes --   CHL IP OTHER RECOMMENDATIONS 02/16/2018 Recommended Consults -- Oral Care Recommendations Oral care BID Other Recommendations Order thickener from pharmacy   CHL IP FOLLOW UP RECOMMENDATIONS 02/14/2018 Follow up Recommendations Other (comment)   CHL IP FREQUENCY AND DURATION 02/14/2018 Speech Therapy Frequency (ACUTE ONLY) min 2x/week Treatment Duration 2 weeks      CHL IP ORAL PHASE 02/16/2018 Oral Phase Impaired Oral - Pudding Teaspoon -- Oral - Pudding Cup --  Oral - Honey Teaspoon -- Oral - Honey Cup -- Oral - Nectar Teaspoon -- Oral - Nectar Cup Right anterior bolus loss;Weak lingual manipulation;Reduced posterior propulsion;Right pocketing in lateral sulci;Piecemeal swallowing;Delayed oral transit;Decreased bolus cohesion Oral - Nectar Straw -- Oral - Thin Teaspoon -- Oral - Thin Cup Right anterior bolus loss;Weak lingual manipulation;Reduced posterior propulsion;Right pocketing in lateral sulci;Piecemeal swallowing;Delayed oral transit;Decreased bolus cohesion Oral - Thin Straw -- Oral - Puree Right anterior bolus loss;Weak lingual manipulation;Reduced posterior propulsion;Right pocketing in lateral sulci;Piecemeal swallowing;Delayed oral transit;Decreased bolus cohesion Oral - Mech Soft Right anterior bolus loss;Weak lingual manipulation;Reduced posterior propulsion;Right pocketing in lateral sulci;Piecemeal swallowing;Delayed oral transit;Decreased bolus cohesion Oral - Regular -- Oral - Multi-Consistency -- Oral - Pill -- Oral Phase - Comment --  CHL IP PHARYNGEAL PHASE 02/16/2018 Pharyngeal Phase Impaired Pharyngeal- Pudding Teaspoon -- Pharyngeal -- Pharyngeal- Pudding Cup -- Pharyngeal -- Pharyngeal- Honey Teaspoon --  Pharyngeal -- Pharyngeal- Honey Cup -- Pharyngeal -- Pharyngeal- Nectar Teaspoon -- Pharyngeal -- Pharyngeal- Nectar Cup Delayed swallow initiation-pyriform sinuses;Reduced pharyngeal peristalsis;Reduced tongue base retraction;Pharyngeal residue - valleculae Pharyngeal -- Pharyngeal- Nectar Straw -- Pharyngeal -- Pharyngeal- Thin Teaspoon -- Pharyngeal -- Pharyngeal- Thin Cup Delayed swallow initiation-pyriform sinuses;Reduced pharyngeal peristalsis;Reduced tongue base retraction;Penetration/Aspiration before swallow;Trace aspiration Pharyngeal Material enters airway, passes BELOW cords without attempt by patient to eject out (silent aspiration) Pharyngeal- Thin Straw -- Pharyngeal -- Pharyngeal- Puree Reduced pharyngeal peristalsis;Reduced tongue base retraction;Pharyngeal residue - valleculae;Delayed swallow initiation-vallecula Pharyngeal -- Pharyngeal- Mechanical Soft Reduced pharyngeal peristalsis;Reduced tongue base retraction;Pharyngeal residue - valleculae;Delayed swallow initiation-vallecula Pharyngeal -- Pharyngeal- Regular -- Pharyngeal -- Pharyngeal- Multi-consistency -- Pharyngeal -- Pharyngeal- Pill -- Pharyngeal -- Pharyngeal Comment --  No flowsheet data found. No flowsheet data found. Blenda Mounts Laurice 02/16/2018, 4:36 PM                Scheduled Meds: . aspirin  81 mg Oral Daily  . atorvastatin  80 mg Oral q1800  . clopidogrel  75 mg Oral Daily  . glycopyrrolate  0.2 mg Intravenous BID  . heparin injection (subcutaneous)  5,000 Units Subcutaneous Q8H   Continuous Infusions: . sodium chloride 100 mL/hr at 02/16/18 2056    Pamella Pert, MD, PhD [facility] Pager [phone]  If 7PM-7AM, please contact night-coverage [email or web address] Password [facility] 02/17/2018, 3:39 PM

## 2018-02-17 NOTE — Care Management Note (Signed)
Case Management Note  Patient Details  Name: Jorge Burns MRN: [id number] Date of Birth: 28-Jun-1941  Subjective/Objective:                    Action/Plan: Plan is for CIR when medically ready. CM following.    Expected Discharge Date:                  Expected Discharge Plan:     In-House Referral:     Discharge planning Services     Post Acute Care Choice:    Choice offered to:     DME Arranged:    DME Agency:     HH Arranged:    HH Agency:     Status of Service:     If discussed at [employer], dates discussed:    Additional Comments:  Kermit Balo, RN 02/17/2018, 3:54 PM

## 2018-02-17 NOTE — Progress Notes (Signed)
*  PRELIMINARY RESULTS* Vascular Ultrasound Left Carotid Duplex (Doppler) has been completed.   Findings suggest critical left ICA stenosis (80-99%), with velocity of 581/339 cm/s. Left vertebral artery is patent with antegrade flow.  Preliminary results discussed with Dr. Pearlean Brownie.  02/17/2018 8:56 AM Gertie Fey, BS, RVT, RDCS, RDMS

## 2018-02-17 NOTE — Progress Notes (Signed)
Physical Therapy Treatment Patient Details Name: Jorge Burns MRN: [id number] DOB: [birth year] Today's Date: 02/17/2018    History of Present Illness Jorge Burns is a 77 y.o. male with a known history of CVA in 2017, HTN presents to the emergency department for evaluation of facial and right sided weakness associated with headache and slurred speech which began today. MRI: Multifocal acute ischemia within the left hemisphere,predominantly within the deep watershed zone and left ACA/MCA cortical watershed zone. No hemorrhage or mass effect. Small subacute infarct within the right-sided white matter adjacent to the corpus callosum splenium.    PT Comments    Patient seen for mobility progression and family members present and interpreted. Pt stood X 3 (twice using Stedy) and worked on seated and standing balance. Pt did not try to verbalize any answers during session but did follow commands during tasks.  Current plan remains appropriate.   Follow Up Recommendations  Supervision/Assistance - 24 hour;CIR     Equipment Recommendations  Other (comment);Rolling walker with 5" wheels    Recommendations for Other Services Rehab consult     Precautions / Restrictions Precautions Precautions: Fall Restrictions Weight Bearing Restrictions: No    Mobility  Bed Mobility Overal bed mobility: Needs Assistance Bed Mobility: Sit to Supine       Sit to supine: Mod assist   General bed mobility comments: assistance needed to bring R LE into bed and for postioning   Transfers Overall transfer level: Needs assistance   Transfers: Sit to/from Stand Sit to Stand: Mod assist;+2 physical assistance;Min assist         General transfer comment: pt stood X3 once with with +2 HHA and next 2 trials with [facility] [address]; pt required less assistance for stand using Stedy; assistance needed for balance in standing due to R lateral lean   Ambulation/Gait                 Stairs             Wheelchair  Mobility    Modified Rankin (Stroke Patients Only)       Balance Overall balance assessment: Needs assistance Sitting-balance support: Single extremity supported;Feet supported Sitting balance-Leahy Scale: Fair     Standing balance support: Bilateral upper extremity supported Standing balance-Leahy Scale: Poor Standing balance comment: R lateral lean; pt able to correct standing posture to midline with mutlimocal cues                            Cognition Arousal/Alertness: Awake/alert Behavior During Therapy: Flat affect Overall Cognitive Status: Impaired/Different from baseline Area of Impairment: Following commands;Safety/judgement;Problem solving                       Following Commands: Follows one step commands inconsistently;Follows one step commands with increased time Safety/Judgement: Decreased awareness of safety;Decreased awareness of deficits   Problem Solving: Slow processing;Decreased initiation;Difficulty sequencing;Requires verbal cues;Requires tactile cues General Comments: family members present and interpreted during session; pt did not try to verbalize answers to family member and most of the time did not nod head yes/no when asked however pt did assist with tasks without cues       Exercises      General Comments        Pertinent Vitals/Pain Pain Assessment: Faces Faces Pain Scale: Hurts a little bit Pain Location: unspecified, during transfer Pain Descriptors / Indicators: Grimacing Pain Intervention(s): Monitored during session    Home  Living   Living Arrangements: Other (Comment)(lives with his "niece" [name] and her family; she call him [place] Available Help at Discharge: ([name] states when she is in class in the morning, she will hav)                Prior Function            PT Goals (current goals can now be found in the care plan section) Acute Rehab PT Goals Patient Stated Goal: pt unable to state, family  agreeable to rehab Progress towards PT goals: Progressing toward goals    Frequency    Min 4X/week      PT Plan Current plan remains appropriate    Co-evaluation              AM-PAC PT "6 Clicks" Daily Activity  Outcome Measure  Difficulty turning over in bed (including adjusting bedclothes, sheets and blankets)?: Unable Difficulty moving from lying on back to sitting on the side of the bed? : Unable Difficulty sitting down on and standing up from a chair with arms (e.g., wheelchair, bedside commode, etc,.)?: Unable Help needed moving to and from a bed to chair (including a wheelchair)?: A Lot Help needed walking in hospital room?: Total Help needed climbing 3-5 steps with a railing? : Total 6 Click Score: 7    End of Session Equipment Utilized During Treatment: Gait belt Activity Tolerance: Patient tolerated treatment well Patient left: in bed;with call bell/phone within reach;with bed alarm set;with family/visitor present Nurse Communication: Mobility status PT Visit Diagnosis: Other abnormalities of gait and mobility (R26.89);Hemiplegia and hemiparesis Hemiplegia - Right/Left: Right Hemiplegia - dominant/non-dominant: Dominant Hemiplegia - caused by: Cerebral infarction     Time: 1610-9604 PT Time Calculation (min) (ACUTE ONLY): 30 min  Charges:  $Therapeutic Activity: 23-37 mins                    G Codes:       Erline Levine, PTA Pager: [phone]     Carolynne Edouard 02/17/2018, 4:54 PM

## 2018-02-17 NOTE — Progress Notes (Signed)
OT Cancellation Note  Patient Details Name: Jorge Burns MRN: [id number] DOB: 05-Jun-1941   Cancelled Treatment:    Reason Eval/Treat Not Completed: Patient at procedure or test/ unavailable. Pt is at Vascular.   Pilar Grammes 02/17/2018, 9:14 AM

## 2018-02-17 NOTE — H&P (Signed)
Physical Medicine and Rehabilitation Admission H&P    Chief Complaint  Patient presents with  . 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  Per chart review lives with reported niece and assistance as needed.  One level home 3 steps to entry.  Independent prior to admission and still working.  Presented 02/13/2018 with headache, right-sided weakness and slurred speech.  Cranial CT scan reviewed unremarkable for acute intracranial process.  Per report, old right parietal infarct.  Patient did not receive TPA.  MRI showed multifocal acute ischemia within the left hemisphere, predominantly within the deep watershed zone and left ACA/MCA.  Small subacute infarct within the right side white matter adjacent to the corpus callosum.  MRA of the head with no intracranial occlusion or high-grade stenosis.  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  CTA of head and neck showed 50% right ICA with a 3 to 4 mm posterior projecting ulceration.  Critical stenosis left ICA.  Interventional radiology consulted for cerebral angiogram that showed 95% plus LT ICA proximal stenosis with slow antegrade flow.  Approximately 35% stenosis of right ICA proximal with a 5 mm ulcerated plaque.  Echocardiogram with ejection fraction of 17% grade 1 diastolic dysfunction.  EEG was negative.  Carotid Doppler studies 02/17/2018 showing left ICA stenosis 80 - 99%.  Interventional radiology follow-up plan 1 to 2 weeks for proximal left ICA revascularization.  Presently on aspirin and Plavix for CVA prophylaxis.  Subcutaneous heparin for DVT prophylaxis.  Dysphasia #1 nectar thick liquid diet.  Physical and occupational therapy evaluations completed with recommendations of physical medicine rehab consult.  Review of Systems  Unable to perform ROS: Language   Past Medical History:  Diagnosis Date  .  Stroke [facility])    "a long time ago -- 2 years ago"   Past Surgical History:  Procedure Laterality Date  . IR ANGIO INTRA EXTRACRAN SEL COM CAROTID INNOMINATE BILAT MOD SED  02/15/2018  . IR ANGIO VERTEBRAL SEL SUBCLAVIAN INNOMINATE UNI R MOD SED  02/15/2018  . IR ANGIO VERTEBRAL SEL VERTEBRAL UNI L MOD SED  02/15/2018   No family history on file. Social History:  reports that he has never smoked. He has never used smokeless tobacco. He reports that he does not drink alcohol or use drugs. Allergies: No Known Allergies Medications Prior to Admission  Medication Sig Dispense Refill  . butalbital-acetaminophen-caffeine (FIORICET, ESGIC) 50-325-40 MG tablet Take 1-2 tablets by mouth every 8 (eight) hours as needed for headache. 15 tablet 0    Drug Regimen Review Drug regimen was reviewed and remains appropriate with no significant issues identified  Home: Home Living Family/patient expects to be discharged to:: Private residence Living Arrangements: Other (Comment)(neice stays home with small children) Available Help at Discharge: Family, Available 24 hours/day Type of Home: House Home Access: Stairs to enter [employer] of Steps: 3 Home Layout: One level Bathroom Shower/Tub: Multimedia programmer: Standard Home Equipment: None  Lives With: Friend(s)   Functional History: Prior Function Level of Independence: Independent 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  Functional Status:  Mobility: Bed Mobility Overal bed mobility: Needs Assistance Bed Mobility: Supine to Sit Supine to sit: Min assist, HOB elevated Transfers Overall transfer level: Needs assistance Equipment used: 2 person hand held assist Transfers: Sit to/from Stand Sit to Stand: +2 physical assistance, Min assist General transfer comment:  assist for safety and R side awarness wtih bilateral HHA Ambulation/Gait Ambulation/Gait assistance: Min assist, +2 physical  assistance Ambulation Distance (Feet): 40 Feet Assistive device: 2 person hand held assist Gait Pattern/deviations: Step-through pattern, Decreased stride length, Decreased weight shift to right, Drifts right/left General Gait Details: leads with L side of his body needs assist for heavy R arm and for balance (falls to R and back without UE support)    ADL: ADL Overall ADL's : Needs assistance/impaired Eating/Feeding: NPO Grooming: Moderate assistance, Sitting Upper Body Bathing: Moderate assistance, Sitting Lower Body Bathing: Maximal assistance Lower Body Bathing Details (indicate cue type and reason): min A +2 sit<>stand Upper Body Dressing : Maximal assistance, Sitting Lower Body Dressing: Total assistance Lower Body Dressing Details (indicate cue type and reason): min A +2 sit<>stand Toilet Transfer: Minimal assistance, +2 for physical assistance, Ambulation Toilet Transfer Details (indicate cue type and reason): Bil HHA Toileting- Clothing Manipulation and Hygiene: Total assistance Toileting - Clothing Manipulation Details (indicate cue type and reason): min A +2 sit<>stand  Cognition: Cognition Overall Cognitive Status: Impaired/Different from baseline(difficult to assess secondary to language deficits) Arousal/Alertness: Awake/alert Orientation Level: Oriented to person Attention: Focused Focused Attention: Appears intact Cognition Arousal/Alertness: Awake/alert Behavior During Therapy: Flat affect Overall Cognitive Status: Impaired/Different from baseline(difficult to assess secondary to language deficits) Area of Impairment: Following commands, Safety/judgement, Problem solving Following Commands: Follows one step commands inconsistently, Follows one step commands with increased time(increased time for all commands and difficulty following even with gestures and neice and son interpreting) Safety/Judgement: Decreased awareness of safety, Decreased awareness of  deficits Problem Solving: Slow processing, Decreased initiation, Difficulty sequencing, Requires verbal cues, Requires tactile cues  Physical Exam: Blood pressure 138/78, pulse 62, temperature 97.7 F (36.5 C), temperature source Oral, resp. rate 18, height _0  (1.676 m), weight 57.2 kg (126 lb 1.7 oz), SpO2 93 %. Physical Exam  Constitutional: He appears well-developed.  HENT:  Head: Normocephalic.  Eyes: Pupils are equal, round, and reactive to light. Conjunctivae are normal.  Neck: Normal range of motion. Thyromegaly present.  Cardiovascular: Normal rate and regular rhythm. Exam reveals no friction rub.  No murmur heard. Respiratory: Effort normal. No respiratory distress. He has no wheezes.  GI: Soft. He exhibits no distension. There is no tenderness. There is no rebound.  Musculoskeletal: Normal range of motion.  Neurological:  Non-English-speaking [place] male.  ?aphasia vs culture language barrier. Dense right hemiparesis upper and lower ext (0/5). Moves left upper and lower without hesitation. Senses pain stim RUE and RLE. DTR's 1+. No resting tone. Right central 7 and tongue deviation  Psychiatric:  Flat but does attempt to engage    Results for orders placed or performed during the hospital encounter of 02/12/18 (from the past 48 hour(s))  CBC     Status: Abnormal   Collection Time: 02/16/18  6:24 AM  Result Value Ref Range   WBC 9.4 4.0 - 10.5 K/uL   RBC 5.36 4.22 - 5.81 MIL/uL   Hemoglobin 13.1 13.0 - 17.0 g/dL   HCT 39.8 39.0 - 52.0 %   MCV 74.3 (L) 78.0 - 100.0 fL   MCH 24.4 (L) 26.0 - 34.0 pg   MCHC 32.9 30.0 - 36.0 g/dL   RDW 15.6 (H) 11.5 - 15.5 %   Platelets 250 150 - 400 K/uL    Comment: Performed at [facility], [place] [address] [place],  [address]  Basic metabolic panel     Status: Abnormal   Collection Time:  02/16/18  6:24 AM  Result Value Ref Range   Sodium 139 135 - 145 mmol/L   Potassium 3.8 3.5 - 5.1 mmol/L   Chloride 106 101 - 111  mmol/L   CO2 23 22 - 32 mmol/L   Glucose, Bld 102 (H) 65 - 99 mg/dL   BUN 7 6 - 20 mg/dL   Creatinine, Ser 1.06 0.61 - 1.24 mg/dL   Calcium 8.8 (L) 8.9 - 10.3 mg/dL   GFR calc non Af Amer >60 >60 mL/min   GFR calc Af Amer >60 >60 mL/min    Comment: (NOTE) The eGFR has been calculated using the CKD EPI equation. This calculation has not been validated in all clinical situations. eGFR's persistently <60 mL/min signify possible Chronic Kidney Disease.    Anion gap 10 5 - 15    Comment: Performed at [facility] [address], [place], [place] [address]   Ct Head Wo Contrast  Result Date: 02/16/2018 CLINICAL DATA:  77 year old male with patchy acute left hemisphere infarcts found to have critical left ICA stenosis. EXAM: CT HEAD WITHOUT CONTRAST TECHNIQUE: Contiguous axial images were obtained from the base of the skull through the vertex without intravenous contrast. COMPARISON:  Diagnostic cerebral angiogram 02/15/2018. Brain MRI and CTA head and neck 02/13/2018. Head CT 02/12/2018. FINDINGS: Brain: Progressed patchy hypodensity in the left MCA and left MCA/ACA watershed territory corresponding to areas of restricted diffusion on 02/13/2018. No associated hemorrhage or mass effect. Superimposed chronic posterior right temporal lobe and right parietal lobe encephalomalacia. No new cortically based infarct identified. Stable ventricle size and configuration. No intracranial mass effect Vascular: Calcified atherosclerosis at the skull base. No suspicious intracranial vascular hyperdensity. Skull: No acute osseous abnormality identified. Sinuses/Orbits: Stable paranasal sinuses. Other: Stable orbit and scalp soft tissues. IMPRESSION: 1. Expected evolution of the Left MCA and MCA/ACA watershed territory infarcts seen on 02/13/2018 with no associated hemorrhage or mass effect. 2. Chronic right MCA infarcts. 3. No new intracranial abnormality. Electronically Signed   By: Genevie Ann M.D.   On:  02/16/2018 15:04   Ct Chest Wo Contrast  Result Date: 02/16/2018 CLINICAL DATA:  Pneumonia.  Nonproductive cough. EXAM: CT CHEST WITHOUT CONTRAST TECHNIQUE: Multidetector CT imaging of the chest was performed following the standard protocol without IV contrast. COMPARISON:  None. FINDINGS: Cardiovascular: Atherosclerotic calcifications of the ascending aorta, aortic arch, and descending thoracic aorta are noted. There is no evidence of acute intramural hematoma within the aorta. Maximal diameter of the ascending aorta is 3.4 cm. Minimal coronary artery calcification. Mild aortic valve calcifications. Mediastinum/Nodes: No pericardial effusion. No abnormal mediastinal adenopathy. Large densely calcified nodule in the left lobe of the thyroid gland is noted. Esophagus is within normal limits. Lungs/Pleura: No pneumothorax. No pleural effusion. There are dense calcifications along the pleura of the posterior left lower hemithorax associated with mild pleural thickening. There is minimal pleural thickening and calcification in the posterior right hemithorax. There is a 1.6 x 3.4 cm cavitary lesion at the left lung apex on image 30 containing a lobulated 1.6 cm round density. This may represent a fungus ball within a cavitary lung lesion or possibly a 1.6 cm nodule within a cavitary mass. There are associated linear opacities. There are a number of satellite nodules in the left upper lobe. 8 mm nodule in the left upper lobe on image 33. 5 mm nodule in the left upper lobe on image 42. There is also a small spiculated nodule in the right upper lobe  measuring 7 mm on image 34. Scattered linear scarring in the lungs. There are linear opacities and reticulonodular opacities towards the lung bases left greater than right. There is some bronchiolectasis. There is some mucoid filling of left lower lobe airways. Upper Abdomen: No acute abnormality. Musculoskeletal: No vertebral compression deformity. IMPRESSION: There is a  large cavitary lung lesion at the left apex containing a 1.6 cm nodular density. This finding is nonspecific. Differential diagnosis includes reactivation tuberculosis, a fungal ball within a cavitary lung lesion, or malignancy. Correlate clinically as for the need for further imaging and sputum analysis. PET-CT may be helpful. There are several small satellite nodules in the left upper lobe. There is a 7 mm spiculated nodule in the right upper lobe. Malignancy is not excluded. Reticulonodular opacities at the left base may represent an inflammatory process. There is associated mucoid filling of small airways at the left base and bronchiolectasis. Large densely calcified nodule in the left lobe of the thyroid gland. Aortic Atherosclerosis (ICD10-I70.0). Electronically Signed   By: Marybelle Killings M.D.   On: 02/16/2018 14:45   Dg Swallowing Func-speech Pathology  Result Date: 02/16/2018 Objective Swallowing Evaluation: Type of Study: MBS-Modified Barium Swallow Study  Patient Details Name: Jorge Burns MRN: [id number] Date of Birth: 1941/02/23 Today's Date: 02/16/2018 Time: SLP Start Time (ACUTE ONLY): 1340 -SLP Stop Time (ACUTE ONLY): 1400 SLP Time Calculation (min) (ACUTE ONLY): 20 min Past Medical History: Past Medical History: Diagnosis Date . Stroke [facility])   "a long time ago -- 2 years ago" Past Surgical History: Past Surgical History: Procedure Laterality Date . IR ANGIO INTRA EXTRACRAN SEL COM CAROTID INNOMINATE BILAT MOD SED  02/15/2018 . IR ANGIO VERTEBRAL SEL SUBCLAVIAN INNOMINATE UNI R MOD SED  02/15/2018 . IR ANGIO VERTEBRAL SEL VERTEBRAL UNI L MOD SED  02/15/2018 HPI: Mr. Murphy Duzan is a 77 y.o. male with history of hypertension and a previous stroke presenting with right sided weakness and AMS. He did not receive IV t-PA due to late presentation. MRI showed multifocal acute ischemia within the left hemisphere due to severe left carotid stenosis. Pt initially passed stroke swallow screen, referred for swallow  evaluation due to MD, RN concerns; MD downgraded to D1/nectar.  Repeat MRI 5/29 Expected evolution of the Left MCA and MCA/ACA watershed territory infarcts seen on 02/13/2018 with no associated hemorrhage or mass effect.  Subjective: Alert; interpretor present for study Assessment / Plan / Recommendation CHL IP CLINICAL IMPRESSIONS 02/16/2018 Clinical Impression Pt presents with a moderate sensorimotor dysphagia marked by decreased bolus control/cohesion with anterior loss and right buccal residue post-swallow; decreased pharyngeal squeeze and reduced tongue base contact with pharyngeal walls, leading to vallecular residue post-swallow; silent aspiration of thin liquids with material entering posterior larynx/trachea before onset of pharyngeal swallow.  Due to aphasia, despite interpreter present to assist with communication, pt unable to follow commands for postural adjustments/swallow modifications.  Recommend continuing downgrading diet to dysphagia 1 for now; continue nectar thick liquids; give meds crushed in puree due to residue.  SLP will follow for safety/education and diet progression.  Interpreter relayed results of study to pt - aphasia compromises pt's comprehension.  SLP Visit Diagnosis Dysphagia, oropharyngeal phase (R13.12) Attention and concentration deficit following -- Frontal lobe and executive function deficit following -- Impact on safety and function Mild aspiration risk;Moderate aspiration risk   CHL IP TREATMENT RECOMMENDATION 02/16/2018 Treatment Recommendations Therapy as outlined in treatment plan below   Prognosis 02/16/2018 Prognosis for Safe Diet Advancement Good Barriers to Reach  Goals Language deficits;Cognitive deficits Barriers/Prognosis Comment -- CHL IP DIET RECOMMENDATION 02/16/2018 SLP Diet Recommendations Dysphagia 1 (Puree) solids;Nectar thick liquid Liquid Administration via Cup Medication Administration Crushed with puree Compensations Slow rate;Small sips/bites;Minimize  environmental distractions;Lingual sweep for clearance of pocketing Postural Changes --   CHL IP OTHER RECOMMENDATIONS 02/16/2018 Recommended Consults -- Oral Care Recommendations Oral care BID Other Recommendations Order thickener from pharmacy   CHL IP FOLLOW UP RECOMMENDATIONS 02/14/2018 Follow up Recommendations Other (comment)   CHL IP FREQUENCY AND DURATION 02/14/2018 Speech Therapy Frequency (ACUTE ONLY) min 2x/week Treatment Duration 2 weeks      CHL IP ORAL PHASE 02/16/2018 Oral Phase Impaired Oral - Pudding Teaspoon -- Oral - Pudding Cup -- Oral - Honey Teaspoon -- Oral - Honey Cup -- Oral - Nectar Teaspoon -- Oral - Nectar Cup Right anterior bolus loss;Weak lingual manipulation;Reduced posterior propulsion;Right pocketing in lateral sulci;Piecemeal swallowing;Delayed oral transit;Decreased bolus cohesion Oral - Nectar Straw -- Oral - Thin Teaspoon -- Oral - Thin Cup Right anterior bolus loss;Weak lingual manipulation;Reduced posterior propulsion;Right pocketing in lateral sulci;Piecemeal swallowing;Delayed oral transit;Decreased bolus cohesion Oral - Thin Straw -- Oral - Puree Right anterior bolus loss;Weak lingual manipulation;Reduced posterior propulsion;Right pocketing in lateral sulci;Piecemeal swallowing;Delayed oral transit;Decreased bolus cohesion Oral - Mech Soft Right anterior bolus loss;Weak lingual manipulation;Reduced posterior propulsion;Right pocketing in lateral sulci;Piecemeal swallowing;Delayed oral transit;Decreased bolus cohesion Oral - Regular -- Oral - Multi-Consistency -- Oral - Pill -- Oral Phase - Comment --  CHL IP PHARYNGEAL PHASE 02/16/2018 Pharyngeal Phase Impaired Pharyngeal- Pudding Teaspoon -- Pharyngeal -- Pharyngeal- Pudding Cup -- Pharyngeal -- Pharyngeal- Honey Teaspoon -- Pharyngeal -- Pharyngeal- Honey Cup -- Pharyngeal -- Pharyngeal- Nectar Teaspoon -- Pharyngeal -- Pharyngeal- Nectar Cup Delayed swallow initiation-pyriform sinuses;Reduced pharyngeal peristalsis;Reduced  tongue base retraction;Pharyngeal residue - valleculae Pharyngeal -- Pharyngeal- Nectar Straw -- Pharyngeal -- Pharyngeal- Thin Teaspoon -- Pharyngeal -- Pharyngeal- Thin Cup Delayed swallow initiation-pyriform sinuses;Reduced pharyngeal peristalsis;Reduced tongue base retraction;Penetration/Aspiration before swallow;Trace aspiration Pharyngeal Material enters airway, passes BELOW cords without attempt by patient to eject out (silent aspiration) Pharyngeal- Thin Straw -- Pharyngeal -- Pharyngeal- Puree Reduced pharyngeal peristalsis;Reduced tongue base retraction;Pharyngeal residue - valleculae;Delayed swallow initiation-vallecula Pharyngeal -- Pharyngeal- Mechanical Soft Reduced pharyngeal peristalsis;Reduced tongue base retraction;Pharyngeal residue - valleculae;Delayed swallow initiation-vallecula Pharyngeal -- Pharyngeal- Regular -- Pharyngeal -- Pharyngeal- Multi-consistency -- Pharyngeal -- Pharyngeal- Pill -- Pharyngeal -- Pharyngeal Comment --  No flowsheet data found. No flowsheet data found. Juan Quam Laurice 02/16/2018, 4:36 PM              Ir Angio Intra Extracran Sel Com Carotid Innominate Bilat Mod Sed  Result Date: 02/16/2018 CLINICAL DATA:  Left cerebral hemispheric watershed strokes with right-sided weakness, and expressive aphasia. EXAM: IR ANGIO VERTEBRAL SEL VERTEBRAL UNI LEFT MOD SED; BILATERAL COMMON CAROTID AND INNOMINATE ANGIOGRAPHY; IR ANGIO VERTEBRAL SEL SUBCLAVIAN INNOMINATE UNI RIGHT MOD SED COMPARISON:  CT angiogram the head and neck of 02/12/2018. MEDICATIONS: Heparin 1000 units IV; no antibiotic was administered within 1 hour of the procedure. ANESTHESIA/SEDATION: Versed 1 mg IV; Fentanyl 25 mcg IV.  Hydralazine 20 mg. Moderate Sedation Time:  30 minutes. The patient was continuously monitored during the procedure by the interventional radiology nurse under my direct supervision. CONTRAST:  Isovue 300 approximately 60 mL. FLUOROSCOPY TIME:  Fluoroscopy Time: 10 minutes 18  seconds (793 mGy). COMPLICATIONS: None immediate. TECHNIQUE: Informed written consent was obtained from the patient's niece after a thorough discussion of the procedural risks, benefits and alternatives. All questions were addressed. Maximal Sterile  Barrier Technique was utilized including caps, mask, sterile gowns, sterile gloves, sterile drape, hand hygiene and skin antiseptic. A timeout was performed prior to the initiation of the procedure. During the procedure, an interpreter was present to assist with communication. The right groin was prepped and draped in the usual sterile fashion. Thereafter using modified Seldinger technique, transfemoral access into the right common femoral artery was obtained without difficulty. Over a 0.035 inch guidewire, a 5 French Pinnacle sheath was inserted. Through this, and also over 0.035 inch guidewire, a 5 [place] JB 1 catheter was advanced to the aortic arch region and selectively positioned in the right common carotid artery, the right subclavian artery, the left common carotid artery and the left vertebral artery. FINDINGS: The left common carotid arteriogram demonstrates the left external carotid artery and its major branches to be widely patent. The left internal carotid artery just distal to the bulb has a severe pre occlusive 95% plus stenosis secondary to circumferential plaque. Distal to this there is mild poststenotic dilatation. More distally, there slow ascent of contrast to the cranial skull base in the left internal carotid artery which appears normal in caliber. The petrous, cavernous and the supraclinoid left ICA are widely patent. The left middle cerebral artery and the left anterior cerebral artery opacify into the capillary and venous phases. The right common carotid arteriogram demonstrates the origin of the right external carotid artery to be widely patent. There a mild stenosis of the proximal right external carotid artery. Its branches are seen to opacify  normally. The right internal carotid artery at the bulb demonstrates an approximately 35-40% stenosis by the NASCET criteria associated with an approximately 5 mm well-defined ulcerated plaque with a resulting pseudoaneurysm measuring approximately 5 mm. No evidence of intraluminal filling defects are seen. The right internal carotid artery distal to this is widely patent. The petrous, cavernous and supraclinoid segments demonstrate wide patency. A right posterior communicating artery is seen opacifying the right posterior cerebral artery distribution. The right middle cerebral artery and the right anterior cerebral artery opacify into the capillary and venous phases. There is mild to moderate stenosis of the proximal inferior division of the right middle cerebral artery, and of the superior division probably representative of intracranial arterial sclerotic plaque. 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 The origin of the right vertebral artery is widely patent. This vessel is seen to opacify to the cranial skull base. There is normal opacification of the hypoplastic right vertebrobasilar junction and the right posterior-inferior cerebellar artery. More distally, there is opacification of the distal right vertebrobasilar junction, with opacification of the basilar artery and the left posterior cerebral artery and transiently the superior cerebellar arteries. The dominant left vertebral artery has approximately 35-40% stenosis just distal to its origin. The vessel is, otherwise, seen to opacify normally to the cranial skull base. Normal opacification is seen in the left vertebrobasilar junction and the left posterior-inferior cerebellar artery. The basilar artery, the left posterior cerebral artery, the superior cerebellar arteries and the anterior-inferior cerebellar arteries are seen to opacify normally into the capillary and venous  phases. Delayed arterial phase demonstrates retrograde opacification of the left posterior parietal cortical and subcortical regions from the leptomeningeal branches arising from the P3 segment of the left posterior cerebral artery. 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 Approximately 35-40% stenosis of the right internal carotid artery just distal to the bulb associated with a 5 mm smooth ulceration/pseudoaneurysm. 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 PLAN: Will discuss findings with patient's neurologist. Electronically Signed   By: Luanne Bras M.D.   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 EXAM: IR ANGIO VERTEBRAL SEL VERTEBRAL UNI LEFT MOD SED; BILATERAL COMMON CAROTID AND INNOMINATE ANGIOGRAPHY; IR ANGIO VERTEBRAL SEL SUBCLAVIAN INNOMINATE UNI RIGHT MOD SED COMPARISON:  CT angiogram the head and neck of 02/12/2018. MEDICATIONS: Heparin 1000 units IV; no antibiotic was administered within 1 hour of the procedure. ANESTHESIA/SEDATION: Versed 1 mg IV; Fentanyl 25 mcg IV.  Hydralazine 20 mg. Moderate Sedation Time:  30 minutes. The patient was continuously monitored during the procedure by the interventional radiology nurse under my direct supervision. CONTRAST:  Isovue 300 approximately 60 mL. FLUOROSCOPY TIME:  Fluoroscopy Time: 10 minutes 18 seconds (793 mGy). COMPLICATIONS: None immediate. TECHNIQUE: Informed written consent was obtained from the patient's niece after a thorough discussion of the procedural risks, benefits and alternatives. All questions were addressed. Maximal Sterile Barrier  Technique was utilized including caps, mask, sterile gowns, sterile gloves, sterile drape, hand hygiene and skin antiseptic. A timeout was performed prior to the initiation of the procedure. During the procedure, an interpreter was present to assist with communication. The right groin was prepped and draped in the usual sterile fashion. Thereafter using modified Seldinger technique, transfemoral access into the right common femoral artery was obtained without difficulty. Over a 0.035 inch guidewire, a 5 French Pinnacle sheath was inserted. Through this, and also over 0.035 inch guidewire, a 5 [place] JB 1 catheter was advanced to the aortic arch region and selectively positioned in the right common carotid artery, the right subclavian artery, the left common carotid artery and the left vertebral artery. FINDINGS: The left common carotid arteriogram demonstrates the left external carotid artery and its major branches to be widely patent. The left internal carotid artery just distal to the bulb has a severe pre occlusive 95% plus stenosis secondary to circumferential plaque. Distal to this there is mild poststenotic dilatation. More distally, there slow ascent of contrast to the cranial skull base in the left internal carotid artery which appears normal in caliber. The petrous, cavernous and the supraclinoid left ICA are widely patent. The left middle cerebral artery and the left anterior cerebral artery opacify into the capillary and venous phases. The right common carotid arteriogram demonstrates the origin of the right external carotid artery to be widely patent. There a mild stenosis of the proximal right external carotid artery. Its branches are seen to opacify normally. The right internal carotid artery at the bulb demonstrates an approximately 35-40% stenosis by the NASCET criteria associated with an approximately 5 mm well-defined ulcerated plaque with a resulting pseudoaneurysm measuring approximately 5 mm. No  evidence of intraluminal filling defects are seen. The right internal carotid artery distal to this is widely patent. The petrous, cavernous and supraclinoid segments demonstrate wide patency. A right posterior communicating artery is seen opacifying the right posterior cerebral artery distribution. The right middle cerebral artery and the right anterior cerebral artery opacify into the capillary and venous phases. There is mild to moderate stenosis of the proximal inferior  division of the right middle cerebral artery, and of the superior division probably representative of intracranial arterial sclerotic plaque. 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 The origin of the right vertebral artery is widely patent. This vessel is seen to opacify to the cranial skull base. There is normal opacification of the hypoplastic right vertebrobasilar junction and the right posterior-inferior cerebellar artery. More distally, there is opacification of the distal right vertebrobasilar junction, with opacification of the basilar artery and the left posterior cerebral artery and transiently the superior cerebellar arteries. The dominant left vertebral artery has approximately 35-40% stenosis just distal to its origin. The vessel is, otherwise, seen to opacify normally to the cranial skull base. Normal opacification is seen in the left vertebrobasilar junction and the left posterior-inferior cerebellar artery. The basilar artery, the left posterior cerebral artery, the superior cerebellar arteries and the anterior-inferior cerebellar arteries are seen to opacify normally into the capillary and venous phases. Delayed arterial phase demonstrates retrograde opacification of the left posterior parietal cortical and subcortical regions from the leptomeningeal branches arising from the P3 segment of the left posterior cerebral artery. 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 Approximately 35-40% stenosis of the right internal carotid artery just distal to the bulb associated with a 5 mm smooth ulceration/pseudoaneurysm. 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 PLAN: Will discuss findings with patient's neurologist. Electronically Signed   By: Luanne Bras M.D.   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 EXAM: IR ANGIO VERTEBRAL SEL VERTEBRAL UNI LEFT MOD SED; BILATERAL COMMON CAROTID AND INNOMINATE ANGIOGRAPHY; IR ANGIO VERTEBRAL SEL SUBCLAVIAN INNOMINATE UNI RIGHT MOD SED COMPARISON:  CT angiogram the head and neck of 02/12/2018. MEDICATIONS: Heparin 1000 units IV; no antibiotic was administered within 1 hour of the procedure. ANESTHESIA/SEDATION: Versed 1 mg IV; Fentanyl 25 mcg IV.  Hydralazine 20 mg. Moderate Sedation Time:  30 minutes. The patient was continuously monitored during the procedure by the interventional radiology nurse under my direct supervision. CONTRAST:  Isovue 300 approximately 60 mL. FLUOROSCOPY TIME:  Fluoroscopy Time: 10 minutes 18 seconds (793 mGy). COMPLICATIONS: None immediate. TECHNIQUE: Informed written consent was obtained from the patient's niece after a thorough discussion of the procedural risks, benefits and alternatives. All questions were addressed. Maximal Sterile Barrier Technique was utilized including caps, mask, sterile gowns, sterile gloves, sterile drape, hand hygiene and skin antiseptic. A timeout was performed prior to the initiation of the procedure. During the procedure, an interpreter was present to assist with  communication. The right groin was prepped and draped in the usual sterile fashion. Thereafter using modified Seldinger technique, transfemoral access into the right common femoral artery was obtained without difficulty. Over a 0.035 inch guidewire, a 5 French Pinnacle sheath was inserted. Through this, and also over 0.035 inch guidewire, a 5 [place] JB 1 catheter was advanced to the aortic arch region and selectively positioned in the right common carotid artery, the right subclavian artery, the left common carotid artery and the left vertebral artery. FINDINGS: The left common carotid arteriogram demonstrates the left external carotid artery  and its major branches to be widely patent. The left internal carotid artery just distal to the bulb has a severe pre occlusive 95% plus stenosis secondary to circumferential plaque. Distal to this there is mild poststenotic dilatation. More distally, there slow ascent of contrast to the cranial skull base in the left internal carotid artery which appears normal in caliber. The petrous, cavernous and the supraclinoid left ICA are widely patent. The left middle cerebral artery and the left anterior cerebral artery opacify into the capillary and venous phases. The right common carotid arteriogram demonstrates the origin of the right external carotid artery to be widely patent. There a mild stenosis of the proximal right external carotid artery. Its branches are seen to opacify normally. The right internal carotid artery at the bulb demonstrates an approximately 35-40% stenosis by the NASCET criteria associated with an approximately 5 mm well-defined ulcerated plaque with a resulting pseudoaneurysm measuring approximately 5 mm. No evidence of intraluminal filling defects are seen. The right internal carotid artery distal to this is widely patent. The petrous, cavernous and supraclinoid segments demonstrate wide patency. A right posterior communicating artery is seen opacifying the  right posterior cerebral artery distribution. The right middle cerebral artery and the right anterior cerebral artery opacify into the capillary and venous phases. There is mild to moderate stenosis of the proximal inferior division of the right middle cerebral artery, and of the superior division probably representative of intracranial arterial sclerotic plaque. 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 The origin of the right vertebral artery is widely patent. This vessel is seen to opacify to the cranial skull base. There is normal opacification of the hypoplastic right vertebrobasilar junction and the right posterior-inferior cerebellar artery. More distally, there is opacification of the distal right vertebrobasilar junction, with opacification of the basilar artery and the left posterior cerebral artery and transiently the superior cerebellar arteries. The dominant left vertebral artery has approximately 35-40% stenosis just distal to its origin. The vessel is, otherwise, seen to opacify normally to the cranial skull base. Normal opacification is seen in the left vertebrobasilar junction and the left posterior-inferior cerebellar artery. The basilar artery, the left posterior cerebral artery, the superior cerebellar arteries and the anterior-inferior cerebellar arteries are seen to opacify normally into the capillary and venous phases. Delayed arterial phase demonstrates retrograde opacification of the left posterior parietal cortical and subcortical regions from the leptomeningeal branches arising from the P3 segment of the left posterior cerebral artery. 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 Approximately 35-40% stenosis of the right internal carotid artery just distal to the bulb associated with a 5 mm  smooth ulceration/pseudoaneurysm. 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 PLAN: Will discuss findings with patient's neurologist. Electronically Signed   By: Luanne Bras M.D.   On: 02/15/2018 12:21       Medical Problem List and Plan: 1.  Right-sided weakness and slurred speech secondary to multifocal acute ischemia within the left hemisphere, predominantly within the deep watershed zone and left ACA/MCA cortical watershed zone.  Small subacute infarct within the right side white matter adjacent to the corpus callosum splenium  -admit to inpatient rehab  -translator needed for communication 2.  DVT Prophylaxis/Anticoagulation: Subcutaneous heparin  3. Pain Management: Tylenol as needed 4. Mood: Provide emotional support 5. Neuropsych: This patient is capable of making decisions on his own behalf. 6. Skin/Wound Care: Routine skin checks 7. Fluids/Electrolytes/Nutrition: Routine in and outs with follow-up chemistries  -encourage PO 8.  Permissive hypertension.  Monitor with increased mobility 9.  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  Procedure based on timeframe of rehab services 10.  Hyperlipidemia.  Lipitor  Post Admission Physician Evaluation: 1. 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 2. Patient is admitted to receive collaborative, interdisciplinary care between the physiatrist, rehab nursing staff, and therapy team. 3. Patient's level of medical complexity and substantial therapy needs in context of that medical necessity cannot be provided at a lesser intensity of care such as a SNF. 4. Patient has experienced substantial functional loss from his/her baseline which was documented above under the "Functional History" and "Functional Status" headings.  Judging by the patient's diagnosis, physical  exam, and functional history, the patient has potential for functional progress which will result in measurable gains while on inpatient rehab.  These gains will be of substantial and practical use upon discharge  in facilitating mobility and self-care at the household level. 5. Physiatrist will provide 24 hour management of medical needs as well as oversight of the therapy plan/treatment and provide guidance as appropriate regarding the interaction of the two. 6. The Preadmission Screening has been reviewed and patient status is unchanged unless otherwise stated above. 7. 24 hour rehab nursing will assist with bladder management, bowel management, safety, skin/wound care, disease management, medication administration, pain management and patient education  and help integrate therapy concepts, techniques,education, etc. 8. PT will assess and treat for/with: Lower extremity strength, range of motion, stamina, balance, functional mobility, safety, adaptive techniques and equipment, NMR, family education, ego support.   Goals are: min assist to supervision 9. OT will assess and treat for/with: ADL's, functional mobility, safety, upper extremity strength, adaptive techniques and equipment, NMR, family education, ego support.   Goals are: min assist to supervision. Therapy may proceed with showering this patient. 10. SLP will assess and treat for/with: cognition, communication, family education.  Goals are: supervision to min assist. 11. Case Management and Social Worker will assess and treat for psychological issues and discharge planning. 12. Team conference will be held weekly to assess progress toward goals and to determine barriers to discharge. 13. Patient will receive at least 3 hours of therapy per day at least 5 days per week. 14. ELOS: 10-14 days       15. Prognosis:  excellent    I have personally performed a face to face diagnostic evaluation of this patient and formulated the key components of  the plan.  Additionally, I have personally reviewed laboratory data, imaging studies, as well as relevant notes and concur with the physician assistant's documentation above.  Meredith Staggers, MD, FAAPMR   Lavon Paganini [place], PA-C 02/17/2018

## 2018-02-18 ENCOUNTER — Inpatient Hospital Stay (HOSPITAL_COMMUNITY)
Admission: RE | Admit: 2018-02-18 | Discharge: 2018-03-12 | DRG: 057 | Disposition: A | Discharge status: Home Health | Payer: Medicare Other | Source: Intra-hospital | Attending: Physical Medicine & Rehabilitation | Admitting: Physical Medicine & Rehabilitation

## 2018-02-18 ENCOUNTER — Inpatient Hospital Stay (HOSPITAL_COMMUNITY): Payer: Medicare Other

## 2018-02-18 DIAGNOSIS — I69320 Aphasia following cerebral infarction: Secondary | ICD-10-CM

## 2018-02-18 DIAGNOSIS — Z79899 Other long term (current) drug therapy: Secondary | ICD-10-CM

## 2018-02-18 DIAGNOSIS — Z23 Encounter for immunization: Secondary | ICD-10-CM | POA: Diagnosis not present

## 2018-02-18 DIAGNOSIS — R531 Weakness: Secondary | ICD-10-CM

## 2018-02-18 DIAGNOSIS — G8191 Hemiplegia, unspecified affecting right dominant side: Secondary | ICD-10-CM

## 2018-02-18 DIAGNOSIS — R58 Hemorrhage, not elsewhere classified: Secondary | ICD-10-CM | POA: Diagnosis not present

## 2018-02-18 DIAGNOSIS — I69328 Other speech and language deficits following cerebral infarction: Secondary | ICD-10-CM

## 2018-02-18 DIAGNOSIS — I10 Essential (primary) hypertension: Secondary | ICD-10-CM | POA: Diagnosis not present

## 2018-02-18 DIAGNOSIS — D62 Acute posthemorrhagic anemia: Secondary | ICD-10-CM

## 2018-02-18 DIAGNOSIS — I6389 Other cerebral infarction: Secondary | ICD-10-CM | POA: Diagnosis not present

## 2018-02-18 DIAGNOSIS — E7800 Pure hypercholesterolemia, unspecified: Secondary | ICD-10-CM

## 2018-02-18 DIAGNOSIS — I69351 Hemiplegia and hemiparesis following cerebral infarction affecting right dominant side: Secondary | ICD-10-CM | POA: Diagnosis not present

## 2018-02-18 DIAGNOSIS — E785 Hyperlipidemia, unspecified: Secondary | ICD-10-CM | POA: Diagnosis present

## 2018-02-18 DIAGNOSIS — I69391 Dysphagia following cerebral infarction: Secondary | ICD-10-CM

## 2018-02-18 HISTORY — DX: Essential (primary) hypertension: I10

## 2018-02-18 LAB — URINE DRUGS OF ABUSE SCREEN W ALC, ROUTINE (REF LAB)
Amphetamines, Urine: NEGATIVE ng/mL
Benzodiazepine Quant, Ur: NEGATIVE ng/mL
Cannabinoid Quant, Ur: NEGATIVE ng/mL
Cocaine (Metab.): NEGATIVE ng/mL
Ethanol U, Quan: NEGATIVE %
Methadone Screen, Urine: NEGATIVE ng/mL
Opiate Quant, Ur: NEGATIVE ng/mL
Phencyclidine, Ur: NEGATIVE ng/mL
Propoxyphene, Urine: NEGATIVE ng/mL

## 2018-02-18 LAB — DRUG PROFILE 799023
Amobarbital, Ur: NEGATIVE
BARBITURATES: POSITIVE — AB
Butalbital GC/MS: >3000 ng/mL
Butalbital, Ur: POSITIVE — AB
Pentobarbital, Ur: NEGATIVE
Phenobarbital,Ur: NEGATIVE
Secobarbital, Ur: NEGATIVE

## 2018-02-18 LAB — CBC
HCT: 38.3 % — ABNORMAL LOW (ref 39.0–52.0)
Hemoglobin: 12.6 g/dL — ABNORMAL LOW (ref 13.0–17.0)
MCH: 24 pg — ABNORMAL LOW (ref 26.0–34.0)
MCHC: 32.9 g/dL (ref 30.0–36.0)
MCV: 73.1 fL — ABNORMAL LOW (ref 78.0–100.0)
Platelets: 250 10*3/uL (ref 150–400)
RBC: 5.24 MIL/uL (ref 4.22–5.81)
RDW: 15.4 % (ref 11.5–15.5)
WBC: 7.6 10*3/uL (ref 4.0–10.5)

## 2018-02-18 LAB — CREATININE, SERUM
Creatinine, Ser: 1.08 mg/dL (ref 0.61–1.24)
GFR calc Af Amer: >60 mL/min (ref >60)
GFR calc non Af Amer: >60 mL/min (ref >60)

## 2018-02-18 MED ORDER — ASPIRIN 81 MG PO CHEW
81.0000 mg | CHEWABLE_TABLET | Freq: Every day | ORAL | Status: DC
  Administered 2018-02-19 – 2018-03-12 (×22): 81 mg via ORAL
  Filled 2018-02-18 (×22): qty 1

## 2018-02-18 MED ORDER — ATORVASTATIN CALCIUM 80 MG PO TABS: 80.0000 mg | ORAL_TABLET | Freq: Every day | ORAL | Status: DC

## 2018-02-18 MED ORDER — ASPIRIN 81 MG PO CHEW: 81.0000 mg | CHEWABLE_TABLET | Freq: Every day | ORAL | Status: DC

## 2018-02-18 MED ORDER — ACETAMINOPHEN 650 MG RE SUPP: 650.0000 mg | RECTAL | Status: DC | PRN

## 2018-02-18 MED ORDER — ATORVASTATIN CALCIUM 80 MG PO TABS
80.0000 mg | ORAL_TABLET | Freq: Every day | ORAL | Status: DC
  Administered 2018-02-18 – 2018-03-11 (×22): 80 mg via ORAL
  Filled 2018-02-18 (×22): qty 1

## 2018-02-18 MED ORDER — SENNOSIDES-DOCUSATE SODIUM 8.6-50 MG PO TABS
1.0000 | ORAL_TABLET | Freq: Every evening | ORAL | Status: DC | PRN
  Administered 2018-02-22: 1 via ORAL
  Filled 2018-02-18: qty 1

## 2018-02-18 MED ORDER — ACETAMINOPHEN 160 MG/5ML PO SOLN: 650.0000 mg | ORAL | Status: DC | PRN

## 2018-02-18 MED ORDER — HEPARIN SODIUM (PORCINE) 5000 UNIT/ML IJ SOLN: 5000.0000 [IU] | Freq: Three times a day (TID) | INTRAMUSCULAR | Status: DC

## 2018-02-18 MED ORDER — CLOPIDOGREL BISULFATE 75 MG PO TABS
75.0000 mg | ORAL_TABLET | Freq: Every day | ORAL | Status: DC
  Administered 2018-02-19 – 2018-03-12 (×22): 75 mg via ORAL
  Filled 2018-02-18 (×22): qty 1

## 2018-02-18 MED ORDER — CLOPIDOGREL BISULFATE 75 MG PO TABS: 75.0000 mg | ORAL_TABLET | Freq: Every day | ORAL | Status: DC

## 2018-02-18 MED ORDER — ACETAMINOPHEN 325 MG PO TABS: 650.0000 mg | ORAL_TABLET | ORAL | Status: DC | PRN

## 2018-02-18 MED ORDER — HEPARIN SODIUM (PORCINE) 5000 UNIT/ML IJ SOLN
5000.0000 [IU] | Freq: Three times a day (TID) | INTRAMUSCULAR | Status: DC
  Administered 2018-02-18 – 2018-03-07 (×51): 5000 [IU] via SUBCUTANEOUS
  Filled 2018-02-18 (×52): qty 1

## 2018-02-18 NOTE — Discharge Summary (Signed)
Physician Discharge Summary  [facility] Jorge Burns ZOX:[id number] DOB: July 30, 1941 DOA: 02/12/2018  PCP: Patient, No Pcp Per  Admit date: 02/12/2018 Discharge date: 02/18/2018  Admitted From: home Disposition:  CIR  Recommendations for Outpatient Follow-up:  1. Follow up with IR in 1 week for repeat arteriogram   Home Health: none Equipment/Devices: none  Discharge Condition: stable CODE STATUS: Full code Diet recommendation: dysphagia 1, nectar thick   HPI: Per Dr. Emmit Pomfret, [facility] Jorge Burns is a 77 y.o. male with a known history of CVA in 2017, HTN presents to the emergency department for evaluation of facial and right sided weakness associated with headache and slurred speech which began today. Of note, he was seen in the ED for migraine symptoms yesterday which improved with medication in the ER and was discharged home.  Patient denies fevers/chills, weakness, dizziness, chest pain, shortness of breath, N/V/C/D, abdominal pain, dysuria/frequency, changes in mental status. Otherwise there has been no change in status. Patient has been taking medication as prescribed and there has been no recent change in medication or diet.  No recent antibiotics.  There has been no recent illness, hospitalizations, travel or sick contacts. EMS/ED Course: Patient received aspirin, NS. Medical admission has been requested for further management of right sided weakness, rule out CVA.   Hospital Course: Acute CVA -On admission underwent an MRI which showed multifocal acute ischemia within the left hemisphere, predominantly within the deep watershed zone and left ACA/MCA cortical watershed zone without hemorrhage or mass-effect.  He also has a small subacute infarct within the right sided white matter adjacent to the corpus callosum splenium.  Vascular studies with CT angiogram showed 50% stenosis right ICA and critical stenosis left ICA.  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 Patient was started on Aspirin, Plavix and Lipitor. 2D echo with preserved EF, no evidence of intracardiac thrombus. A1c was 5.7, LDL is elevated 151, continue statin Left upper lung apical scaring -Possible fungus ball versus TB based on the CT scan. Pulmonology and ID were consulted and it appears that this is chronic scarring, no active symptoms, no further work-up at this point  Discharge Diagnoses:  Active Problems:   CVA (cerebral vascular accident) (HCC)   Diastolic dysfunction   Prediabetes   Aphasic disturbance   Right sided weakness   Pulmonary fungal infection   Discharge Instructions  Allergies as of 02/18/2018   No Known Allergies     Medication List    TAKE these medications   aspirin 81 MG chewable tablet Chew 1 tablet (81 mg total) by mouth daily. Start taking on:  02/19/2018   atorvastatin 80 MG tablet Commonly known as:  LIPITOR Take 1 tablet (80 mg total) by mouth daily at 6 PM.   butalbital-acetaminophen-caffeine 50-325-40 MG tablet Commonly known as:  FIORICET, ESGIC Take 1-2 tablets by mouth every 8 (eight) hours as needed for headache.   clopidogrel 75 MG tablet Commonly known as:  PLAVIX Take 1 tablet (75 mg total) by mouth daily. Start taking on:  02/19/2018      Consultations:  Neurology   IR  Procedures/Studies:  2D echo  Impressions: - Normal LV systolic function; mild diastolic dysfunction; sclerotic aortic valve with trace AI; mild MR; mild LAE; mild TR with mild pulmonary hypertension.  Dg Chest 2 View  Result Date: 02/13/2018 CLINICAL DATA:  TIA. EXAM: CHEST - 2 VIEW COMPARISON:  12/08/2015. FINDINGS: Mild cardiac enlargement. Aortic atherosclerotic calcifications noted.  There is scarring and volume loss involving the left lung. Unchanged from comparison exam. No superimposed airspace consolidation, pulmonary edema, or pleural effusion. Pleuroparenchymal scarring and calcification overlying the left apex is  similar to previous exam. IMPRESSION: 1. No acute cardiopulmonary abnormalities. 2.  Aortic Atherosclerosis (ICD10-I70.0). Electronically Signed   By: Signa Kell M.D.   On: 02/13/2018 09:00   Dg Abd 1 View  Result Date: 02/13/2018 CLINICAL DATA:  Screening for metal prior to MRI. EXAM: ABDOMEN - 1 VIEW COMPARISON:  None. FINDINGS: Metallic fragments are demonstrated in the soft tissues over the right proximal femur and in the left upper quadrant overlying the twelfth distal rib. Scattered gas and stool throughout the colon. No small or large bowel distention. No radiopaque stones. Degenerative changes in the spine. Soft tissue contours appear intact. IMPRESSION: Metallic foreign bodies are demonstrated in the soft tissues over the right hip and in the left upper quadrant. Electronically Signed   By: Burman Nieves M.D.   On: 02/13/2018 04:56   Ct Head Wo Contrast  Result Date: 02/16/2018 CLINICAL DATA:  77 year old male with patchy acute left hemisphere infarcts found to have critical left ICA stenosis. EXAM: CT HEAD WITHOUT CONTRAST TECHNIQUE: Contiguous axial images were obtained from the base of the skull through the vertex without intravenous contrast. COMPARISON:  Diagnostic cerebral angiogram 02/15/2018. Brain MRI and CTA head and neck 02/13/2018. Head CT 02/12/2018. FINDINGS: Brain: Progressed patchy hypodensity in the left MCA and left MCA/ACA watershed territory corresponding to areas of restricted diffusion on 02/13/2018. No associated hemorrhage or mass effect. Superimposed chronic posterior right temporal lobe and right parietal lobe encephalomalacia. No new cortically based infarct identified. Stable ventricle size and configuration. No intracranial mass effect Vascular: Calcified atherosclerosis at the skull base. No suspicious intracranial vascular hyperdensity. Skull: No acute osseous abnormality identified. Sinuses/Orbits: Stable paranasal sinuses. Other: Stable orbit and scalp soft  tissues. IMPRESSION: 1. Expected evolution of the Left MCA and MCA/ACA watershed territory infarcts seen on 02/13/2018 with no associated hemorrhage or mass effect. 2. Chronic right MCA infarcts. 3. No new intracranial abnormality. Electronically Signed   By: Odessa Fleming M.D.   On: 02/16/2018 15:04   Ct Head Wo Contrast  Result Date: 02/12/2018 CLINICAL DATA:  On Thursday, patient was evaluated for speech difficulty, left-sided weakness, and left eye visual changes. Now today patient presents with right-sided facial droop, abnormal speech, and slurring. EXAM: CT HEAD WITHOUT CONTRAST TECHNIQUE: Contiguous axial images were obtained from the base of the skull through the vertex without intravenous contrast. COMPARISON:  02/11/2018 FINDINGS: Brain: Mild diffuse cerebral atrophy. Focal area of encephalomalacia involving the right anterior parietal lobe consistent with old infarct. Patchy low-attenuation changes in the deep white matter consistent with small vessel ischemia. Old lacunar infarct in the left caudate. No mass-effect or midline shift. No abnormal extra-axial fluid collections. Gray-white matter junctions are distinct. Basal cisterns are not effaced. No acute intracranial hemorrhage. Vascular: Intracranial arterial vascular calcifications are present. Skull: Calvarium appears intact. Sinuses/Orbits: Mucosal thickening in the paranasal sinuses. No acute air-fluid levels. Hypoaeration of the right mastoid air cells. Other: None. IMPRESSION: No acute intracranial abnormalities. Chronic atrophy and small vessel ischemic changes. Old right parietal infarct. Electronically Signed   By: Burman Nieves M.D.   On: 02/12/2018 23:57   Ct Head Wo Contrast  Result Date: 02/11/2018 CLINICAL DATA:  Headache and dizziness with blurry vision EXAM: CT HEAD WITHOUT CONTRAST TECHNIQUE: Contiguous axial images were obtained from the base of the skull  through the vertex without intravenous contrast. COMPARISON:  11/28/2015  head CT FINDINGS: Brain: No acute territorial infarction, hemorrhage or intracranial mass. Encephalomalacia in the right temporal lobe as before. Probable tiny focus of encephalomalacia in the high right parietal lobe, probably unchanged compared with 2017 comparison head CT. Mild atrophy. Stable ventricle size with mild ex vacuo dilatation of right lateral ventricle. Vascular: No hyperdense vessels.  Carotid vascular calcification Skull: Normal. Negative for fracture or focal lesion. Sinuses/Orbits: Mucosal thickening in the ethmoid sinuses. No acute orbital abnormality. Other: None IMPRESSION: 1. No definite CT evidence for acute intracranial abnormality. 2. Atrophy and right temporal lobe encephalomalacia. Electronically Signed   By: Jasmine Pang M.D.   On: 02/11/2018 20:05   Ct Angio Neck W Or Wo Contrast  Result Date: 02/13/2018 CLINICAL DATA:  Akinetic mediastinum. RIGHT facial weakness. RIGHT upper extremity weakness EXAM: CT ANGIOGRAPHY NECK TECHNIQUE: Multidetector CT imaging of the neck was performed using the standard protocol during bolus administration of intravenous contrast. Multiplanar CT image reconstructions and MIPs were obtained to evaluate the vascular anatomy. Carotid stenosis measurements (when applicable) are obtained utilizing NASCET criteria, using the distal internal carotid diameter as the denominator. CONTRAST:  50mL ISOVUE-370 IOPAMIDOL (ISOVUE-370) INJECTION 76% COMPARISON:  MRI brain earlier today demonstrates multiple LEFT-sided infarcts FINDINGS: Aortic arch: Standard branching. Imaged portion shows no evidence of aneurysm or dissection. No significant stenosis of the major arch vessel origins. Aortic atherosclerosis with calcification and intimal plaque. Right carotid system: Heavily calcified plaque begins in the distal common carotid artery just before the bifurcation. There is calcified and noncalcified plaque at the origin of the RIGHT internal carotid artery. Suspected  3-4 mm posterior ulceration. 50% stenosis of the proximal RIGHT ICA, based on luminal measurements of 2.0/3.9 proximal/distal. No dissection. Left carotid system: Calcific and noncalcified plaque at the origin of the LEFT ICA. There is a critical stenosis just above the bifurcation, with a lumen too small to reliably measure, less than 1 mm diameter as seen on series 7, image 96. There is slightly reduced caliber, at least compared to the RIGHT side, of the LEFT cervical ICA, 3 mm diameter, further suggesting a proximal stenosis of hemodynamic significance. No dissection. Vertebral arteries: BILATERAL patent. LEFT dominant. No ostial narrowing of significance. Skeleton: Cervical spondylosis.  No worrisome osseous lesion. Other neck: No airway narrowing. 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 Thyroid sonography recommended. Poor dentition. Upper chest: There is scarring and volume loss in the LEFT hemithorax. There is a possible fungus ball, 11 x 11 mm within a LEFT apical cavity. Pneumomediastinum is present of uncertain etiology. RIGHT apical scarring. 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 This could serve as a source of distal emboli. Critical stenosis LEFT ICA. Diminished caliber of the distal cervical segment. Surgical and/or neuro interventional consultation may be warranted. Findings discussed with neurology PA. LEFT apical scarring, possible fungus ball, along with pneumomediastinum of uncertain significance. Chronic or acute tuberculous infection cannot completely be excluded. Formal chest CT is warranted for further evaluation. Electronically Signed   By: Elsie Stain M.D.   On: 02/13/2018 17:04   Ct Chest Wo Contrast  Result Date: 02/16/2018 CLINICAL DATA:  Pneumonia.  Nonproductive cough. EXAM: CT CHEST WITHOUT CONTRAST TECHNIQUE: Multidetector CT imaging of the chest was performed following the standard protocol without IV contrast.  COMPARISON:  None. FINDINGS: Cardiovascular: Atherosclerotic calcifications of the ascending aorta, aortic arch, and descending thoracic aorta are  noted. There is no evidence of acute intramural hematoma within the aorta. Maximal diameter of the ascending aorta is 3.4 cm. Minimal coronary artery calcification. Mild aortic valve calcifications. Mediastinum/Nodes: No pericardial effusion. No abnormal mediastinal adenopathy. Large densely calcified nodule in the left lobe of the thyroid gland is noted. Esophagus is within normal limits. Lungs/Pleura: No pneumothorax. No pleural effusion. There are dense calcifications along the pleura of the posterior left lower hemithorax associated with mild pleural thickening. There is minimal pleural thickening and calcification in the posterior right hemithorax. There is a 1.6 x 3.4 cm cavitary lesion at the left lung apex on image 30 containing a lobulated 1.6 cm round density. This may represent a fungus ball within a cavitary lung lesion or possibly a 1.6 cm nodule within a cavitary mass. There are associated linear opacities. There are a number of satellite nodules in the left upper lobe. 8 mm nodule in the left upper lobe on image 33. 5 mm nodule in the left upper lobe on image 42. There is also a small spiculated nodule in the right upper lobe measuring 7 mm on image 34. Scattered linear scarring in the lungs. There are linear opacities and reticulonodular opacities towards the lung bases left greater than right. There is some bronchiolectasis. There is some mucoid filling of left lower lobe airways. Upper Abdomen: No acute abnormality. Musculoskeletal: No vertebral compression deformity. IMPRESSION: There is a large cavitary lung lesion at the left apex containing a 1.6 cm nodular density. This finding is nonspecific. Differential diagnosis includes reactivation tuberculosis, a fungal ball within a cavitary lung lesion, or malignancy. Correlate clinically as for the need  for further imaging and sputum analysis. PET-CT may be helpful. There are several small satellite nodules in the left upper lobe. There is a 7 mm spiculated nodule in the right upper lobe. Malignancy is not excluded. Reticulonodular opacities at the left base may represent an inflammatory process. There is associated mucoid filling of small airways at the left base and bronchiolectasis. Large densely calcified nodule in the left lobe of the thyroid gland. Aortic Atherosclerosis (ICD10-I70.0). Electronically Signed   By: Jolaine Click M.D.   On: 02/16/2018 14:45   Mr Angiogram Head Wo Contrast  Result Date: 02/13/2018 CLINICAL DATA:  Acute onset weakness EXAM: MR HEAD WITHOUT CONTRAST MR CIRCLE OF WILLIS WITHOUT CONTRAST MRA OF THE NECK WITHOUT AND WITH CONTRAST TECHNIQUE: Multiplanar, multiecho pulse sequences of the brain, circle of willis and surrounding structures were obtained without intravenous contrast. Angiographic images of the neck were obtained using MRA technique without and with intravenous contrast. CONTRAST:  12 mL gadobenate dimeglumine (MULTIHANCE) injection COMPARISON:  Head CT 02/12/2018 FINDINGS: MRI HEAD FINDINGS Brain: The midline structures are normal. There are numerous foci of abnormal diffusion restriction within the left hemisphere, predominantly within the cortical and deep watershed zones. There is a focus mildly hyperintense DWI signal at the right aspect of the corpus callosum splenium without corresponding ADC abnormality, likely a subacute infarct. There is no midline shift or mass effect. Old right parietal and temporal infarcts. Multifocal hyperintense T2-weighted signal in the white matter compatible with chronic ischemic microangiopathy. No mass lesion. No chronic microhemorrhage or cerebral amyloid angiopathy. No hydrocephalus, age advanced atrophy or lobar predominant volume loss. No dural abnormality or extra-axial collection. Skull and upper cervical spine: The visualized  skull base, calvarium, upper cervical spine and extracranial soft tissues are normal. Sinuses/Orbits: Right mastoid effusion normal orbits. MRA HEAD FINDINGS Intracranial internal carotid arteries: Normal. Anterior  cerebral arteries: Normal. Middle cerebral arteries: Normal. Posterior communicating arteries: Present on the right. Posterior cerebral arteries: Normal. Basilar artery: Normal. Vertebral arteries: Left dominant. Normal. Superior cerebellar arteries: Normal. Anterior inferior cerebellar arteries: Normal. Posterior inferior cerebellar arteries: Normal. MRA NECK FINDINGS Aortic arch: Normal 3 vessel aortic branching pattern. The visualized subclavian arteries are normal. Right carotid system: There is a small rounded focus measuring 4 x 3 mm projecting posteriorly from the proximal right internal carotid artery. There is no hemodynamically significant right carotid stenosis. Left carotid system: There is complete loss of contrast enhancement within the proximal left internal carotid artery over a short segment. The remainder of the left internal carotid artery is normal. Vertebral arteries: Left dominant. Vertebral artery origins are normal. Vertebral arteries are normal in course and caliber to the vertebrobasilar confluence without stenosis or evidence of dissection. IMPRESSION: 1. Multifocal acute ischemia within the left hemisphere, predominantly within the deep watershed zone and left ACA/MCA cortical watershed zone. No hemorrhage or mass effect. 2. Small subacute infarct within the right-sided white matter adjacent to the corpus callosum splenium. 3. No intracranial occlusion or high-grade stenosis. 4. Loss of enhancement of the proximal left internal carotid artery over a short segment with near-immediate reconstitution. This indicates either critical stenosis or occlusion. 5. 4 x 3 mm rounded focus projecting posteriorly from the proximal right internal carotid artery, most consistent with a small  aneurysm. This might be artifactual secondary to the presence of atherosclerotic plaque at the bifurcation. If clinically warranted, this could be further characterized with CTA of the neck. Electronically Signed   By: Deatra Robinson M.D.   On: 02/13/2018 06:21   Mr Angiogram Neck W Or Wo Contrast  Result Date: 02/13/2018 CLINICAL DATA:  Acute onset weakness EXAM: MR HEAD WITHOUT CONTRAST MR CIRCLE OF WILLIS WITHOUT CONTRAST MRA OF THE NECK WITHOUT AND WITH CONTRAST TECHNIQUE: Multiplanar, multiecho pulse sequences of the brain, circle of willis and surrounding structures were obtained without intravenous contrast. Angiographic images of the neck were obtained using MRA technique without and with intravenous contrast. CONTRAST:  12 mL gadobenate dimeglumine (MULTIHANCE) injection COMPARISON:  Head CT 02/12/2018 FINDINGS: MRI HEAD FINDINGS Brain: The midline structures are normal. There are numerous foci of abnormal diffusion restriction within the left hemisphere, predominantly within the cortical and deep watershed zones. There is a focus mildly hyperintense DWI signal at the right aspect of the corpus callosum splenium without corresponding ADC abnormality, likely a subacute infarct. There is no midline shift or mass effect. Old right parietal and temporal infarcts. Multifocal hyperintense T2-weighted signal in the white matter compatible with chronic ischemic microangiopathy. No mass lesion. No chronic microhemorrhage or cerebral amyloid angiopathy. No hydrocephalus, age advanced atrophy or lobar predominant volume loss. No dural abnormality or extra-axial collection. Skull and upper cervical spine: The visualized skull base, calvarium, upper cervical spine and extracranial soft tissues are normal. Sinuses/Orbits: Right mastoid effusion normal orbits. MRA HEAD FINDINGS Intracranial internal carotid arteries: Normal. Anterior cerebral arteries: Normal. Middle cerebral arteries: Normal. Posterior communicating  arteries: Present on the right. Posterior cerebral arteries: Normal. Basilar artery: Normal. Vertebral arteries: Left dominant. Normal. Superior cerebellar arteries: Normal. Anterior inferior cerebellar arteries: Normal. Posterior inferior cerebellar arteries: Normal. MRA NECK FINDINGS Aortic arch: Normal 3 vessel aortic branching pattern. The visualized subclavian arteries are normal. Right carotid system: There is a small rounded focus measuring 4 x 3 mm projecting posteriorly from the proximal right internal carotid artery. There is no hemodynamically significant right carotid stenosis. Left  carotid system: There is complete loss of contrast enhancement within the proximal left internal carotid artery over a short segment. The remainder of the left internal carotid artery is normal. Vertebral arteries: Left dominant. Vertebral artery origins are normal. Vertebral arteries are normal in course and caliber to the vertebrobasilar confluence without stenosis or evidence of dissection. IMPRESSION: 1. Multifocal acute ischemia within the left hemisphere, predominantly within the deep watershed zone and left ACA/MCA cortical watershed zone. No hemorrhage or mass effect. 2. Small subacute infarct within the right-sided white matter adjacent to the corpus callosum splenium. 3. No intracranial occlusion or high-grade stenosis. 4. Loss of enhancement of the proximal left internal carotid artery over a short segment with near-immediate reconstitution. This indicates either critical stenosis or occlusion. 5. 4 x 3 mm rounded focus projecting posteriorly from the proximal right internal carotid artery, most consistent with a small aneurysm. This might be artifactual secondary to the presence of atherosclerotic plaque at the bifurcation. If clinically warranted, this could be further characterized with CTA of the neck. Electronically Signed   By: Deatra Robinson M.D.   On: 02/13/2018 06:21   Mr Brain Wo Contrast  Result Date:  02/13/2018 CLINICAL DATA:  Acute onset weakness EXAM: MR HEAD WITHOUT CONTRAST MR CIRCLE OF WILLIS WITHOUT CONTRAST MRA OF THE NECK WITHOUT AND WITH CONTRAST TECHNIQUE: Multiplanar, multiecho pulse sequences of the brain, circle of willis and surrounding structures were obtained without intravenous contrast. Angiographic images of the neck were obtained using MRA technique without and with intravenous contrast. CONTRAST:  12 mL gadobenate dimeglumine (MULTIHANCE) injection COMPARISON:  Head CT 02/12/2018 FINDINGS: MRI HEAD FINDINGS Brain: The midline structures are normal. There are numerous foci of abnormal diffusion restriction within the left hemisphere, predominantly within the cortical and deep watershed zones. There is a focus mildly hyperintense DWI signal at the right aspect of the corpus callosum splenium without corresponding ADC abnormality, likely a subacute infarct. There is no midline shift or mass effect. Old right parietal and temporal infarcts. Multifocal hyperintense T2-weighted signal in the white matter compatible with chronic ischemic microangiopathy. No mass lesion. No chronic microhemorrhage or cerebral amyloid angiopathy. No hydrocephalus, age advanced atrophy or lobar predominant volume loss. No dural abnormality or extra-axial collection. Skull and upper cervical spine: The visualized skull base, calvarium, upper cervical spine and extracranial soft tissues are normal. Sinuses/Orbits: Right mastoid effusion normal orbits. MRA HEAD FINDINGS Intracranial internal carotid arteries: Normal. Anterior cerebral arteries: Normal. Middle cerebral arteries: Normal. Posterior communicating arteries: Present on the right. Posterior cerebral arteries: Normal. Basilar artery: Normal. Vertebral arteries: Left dominant. Normal. Superior cerebellar arteries: Normal. Anterior inferior cerebellar arteries: Normal. Posterior inferior cerebellar arteries: Normal. MRA NECK FINDINGS Aortic arch: Normal 3 vessel  aortic branching pattern. The visualized subclavian arteries are normal. Right carotid system: There is a small rounded focus measuring 4 x 3 mm projecting posteriorly from the proximal right internal carotid artery. There is no hemodynamically significant right carotid stenosis. Left carotid system: There is complete loss of contrast enhancement within the proximal left internal carotid artery over a short segment. The remainder of the left internal carotid artery is normal. Vertebral arteries: Left dominant. Vertebral artery origins are normal. Vertebral arteries are normal in course and caliber to the vertebrobasilar confluence without stenosis or evidence of dissection. IMPRESSION: 1. Multifocal acute ischemia within the left hemisphere, predominantly within the deep watershed zone and left ACA/MCA cortical watershed zone. No hemorrhage or mass effect. 2. Small subacute infarct within the right-sided white matter  adjacent to the corpus callosum splenium. 3. No intracranial occlusion or high-grade stenosis. 4. Loss of enhancement of the proximal left internal carotid artery over a short segment with near-immediate reconstitution. This indicates either critical stenosis or occlusion. 5. 4 x 3 mm rounded focus projecting posteriorly from the proximal right internal carotid artery, most consistent with a small aneurysm. This might be artifactual secondary to the presence of atherosclerotic plaque at the bifurcation. If clinically warranted, this could be further characterized with CTA of the neck. Electronically Signed   By: Deatra Robinson M.D.   On: 02/13/2018 06:21   Dg Swallowing Func-speech Pathology  Result Date: 02/16/2018 Objective Swallowing Evaluation: Type of Study: MBS-Modified Barium Swallow Study  Patient Details Name: Shiheem Corporan MRN: [id number] Date of Birth: 1941-07-23 Today's Date: 02/16/2018 Time: SLP Start Time (ACUTE ONLY): 1340 -SLP Stop Time (ACUTE ONLY): 1400 SLP Time Calculation (min) (ACUTE  ONLY): 20 min Past Medical History: Past Medical History: Diagnosis Date . Stroke [facility])   "a long time ago -- 2 years ago" Past Surgical History: Past Surgical History: Procedure Laterality Date . IR ANGIO INTRA EXTRACRAN SEL COM CAROTID INNOMINATE BILAT MOD SED  02/15/2018 . IR ANGIO VERTEBRAL SEL SUBCLAVIAN INNOMINATE UNI R MOD SED  02/15/2018 . IR ANGIO VERTEBRAL SEL VERTEBRAL UNI L MOD SED  02/15/2018 HPI: Mr. Manraj Yeo is a 77 y.o. male with history of hypertension and a previous stroke presenting with right sided weakness and AMS. He did not receive IV t-PA due to late presentation. MRI showed multifocal acute ischemia within the left hemisphere due to severe left carotid stenosis. Pt initially passed stroke swallow screen, referred for swallow evaluation due to MD, RN concerns; MD downgraded to D1/nectar.  Repeat MRI 5/29 Expected evolution of the Left MCA and MCA/ACA watershed territory infarcts seen on 02/13/2018 with no associated hemorrhage or mass effect.  Subjective: Alert; interpretor present for study Assessment / Plan / Recommendation CHL IP CLINICAL IMPRESSIONS 02/16/2018 Clinical Impression Pt presents with a moderate sensorimotor dysphagia marked by decreased bolus control/cohesion with anterior loss and right buccal residue post-swallow; decreased pharyngeal squeeze and reduced tongue base contact with pharyngeal walls, leading to vallecular residue post-swallow; silent aspiration of thin liquids with material entering posterior larynx/trachea before onset of pharyngeal swallow.  Due to aphasia, despite interpreter present to assist with communication, pt unable to follow commands for postural adjustments/swallow modifications.  Recommend continuing downgrading diet to dysphagia 1 for now; continue nectar thick liquids; give meds crushed in puree due to residue.  SLP will follow for safety/education and diet progression.  Interpreter relayed results of study to pt - aphasia compromises pt's  comprehension.  SLP Visit Diagnosis Dysphagia, oropharyngeal phase (R13.12) Attention and concentration deficit following -- Frontal lobe and executive function deficit following -- Impact on safety and function Mild aspiration risk;Moderate aspiration risk   CHL IP TREATMENT RECOMMENDATION 02/16/2018 Treatment Recommendations Therapy as outlined in treatment plan below   Prognosis 02/16/2018 Prognosis for Safe Diet Advancement Good Barriers to Reach Goals Language deficits;Cognitive deficits Barriers/Prognosis Comment -- CHL IP DIET RECOMMENDATION 02/16/2018 SLP Diet Recommendations Dysphagia 1 (Puree) solids;Nectar thick liquid Liquid Administration via Cup Medication Administration Crushed with puree Compensations Slow rate;Small sips/bites;Minimize environmental distractions;Lingual sweep for clearance of pocketing Postural Changes --   CHL IP OTHER RECOMMENDATIONS 02/16/2018 Recommended Consults -- Oral Care Recommendations Oral care BID Other Recommendations Order thickener from pharmacy   CHL IP FOLLOW UP RECOMMENDATIONS 02/14/2018 Follow up Recommendations Other (comment)   CHL IP FREQUENCY  AND DURATION 02/14/2018 Speech Therapy Frequency (ACUTE ONLY) min 2x/week Treatment Duration 2 weeks      CHL IP ORAL PHASE 02/16/2018 Oral Phase Impaired Oral - Pudding Teaspoon -- Oral - Pudding Cup -- Oral - Honey Teaspoon -- Oral - Honey Cup -- Oral - Nectar Teaspoon -- Oral - Nectar Cup Right anterior bolus loss;Weak lingual manipulation;Reduced posterior propulsion;Right pocketing in lateral sulci;Piecemeal swallowing;Delayed oral transit;Decreased bolus cohesion Oral - Nectar Straw -- Oral - Thin Teaspoon -- Oral - Thin Cup Right anterior bolus loss;Weak lingual manipulation;Reduced posterior propulsion;Right pocketing in lateral sulci;Piecemeal swallowing;Delayed oral transit;Decreased bolus cohesion Oral - Thin Straw -- Oral - Puree Right anterior bolus loss;Weak lingual manipulation;Reduced posterior propulsion;Right  pocketing in lateral sulci;Piecemeal swallowing;Delayed oral transit;Decreased bolus cohesion Oral - Mech Soft Right anterior bolus loss;Weak lingual manipulation;Reduced posterior propulsion;Right pocketing in lateral sulci;Piecemeal swallowing;Delayed oral transit;Decreased bolus cohesion Oral - Regular -- Oral - Multi-Consistency -- Oral - Pill -- Oral Phase - Comment --  CHL IP PHARYNGEAL PHASE 02/16/2018 Pharyngeal Phase Impaired Pharyngeal- Pudding Teaspoon -- Pharyngeal -- Pharyngeal- Pudding Cup -- Pharyngeal -- Pharyngeal- Honey Teaspoon -- Pharyngeal -- Pharyngeal- Honey Cup -- Pharyngeal -- Pharyngeal- Nectar Teaspoon -- Pharyngeal -- Pharyngeal- Nectar Cup Delayed swallow initiation-pyriform sinuses;Reduced pharyngeal peristalsis;Reduced tongue base retraction;Pharyngeal residue - valleculae Pharyngeal -- Pharyngeal- Nectar Straw -- Pharyngeal -- Pharyngeal- Thin Teaspoon -- Pharyngeal -- Pharyngeal- Thin Cup Delayed swallow initiation-pyriform sinuses;Reduced pharyngeal peristalsis;Reduced tongue base retraction;Penetration/Aspiration before swallow;Trace aspiration Pharyngeal Material enters airway, passes BELOW cords without attempt by patient to eject out (silent aspiration) Pharyngeal- Thin Straw -- Pharyngeal -- Pharyngeal- Puree Reduced pharyngeal peristalsis;Reduced tongue base retraction;Pharyngeal residue - valleculae;Delayed swallow initiation-vallecula Pharyngeal -- Pharyngeal- Mechanical Soft Reduced pharyngeal peristalsis;Reduced tongue base retraction;Pharyngeal residue - valleculae;Delayed swallow initiation-vallecula Pharyngeal -- Pharyngeal- Regular -- Pharyngeal -- Pharyngeal- Multi-consistency -- Pharyngeal -- Pharyngeal- Pill -- Pharyngeal -- Pharyngeal Comment --  No flowsheet data found. No flowsheet data found. Blenda Mounts Laurice 02/16/2018, 4:36 PM              Ir Angio Intra Extracran Sel Com Carotid Innominate Bilat Mod Sed  Result Date: 02/16/2018 CLINICAL DATA:  Left  cerebral hemispheric watershed strokes with right-sided weakness, and expressive aphasia. EXAM: IR ANGIO VERTEBRAL SEL VERTEBRAL UNI LEFT MOD SED; BILATERAL COMMON CAROTID AND INNOMINATE ANGIOGRAPHY; IR ANGIO VERTEBRAL SEL SUBCLAVIAN INNOMINATE UNI RIGHT MOD SED COMPARISON:  CT angiogram the head and neck of 02/12/2018. MEDICATIONS: Heparin 1000 units IV; no antibiotic was administered within 1 hour of the procedure. ANESTHESIA/SEDATION: Versed 1 mg IV; Fentanyl 25 mcg IV.  Hydralazine 20 mg. Moderate Sedation Time:  30 minutes. The patient was continuously monitored during the procedure by the interventional radiology nurse under my direct supervision. CONTRAST:  Isovue 300 approximately 60 mL. FLUOROSCOPY TIME:  Fluoroscopy Time: 10 minutes 18 seconds (793 mGy). COMPLICATIONS: None immediate. TECHNIQUE: Informed written consent was obtained from the patient's niece after a thorough discussion of the procedural risks, benefits and alternatives. All questions were addressed. Maximal Sterile Barrier Technique was utilized including caps, mask, sterile gowns, sterile gloves, sterile drape, hand hygiene and skin antiseptic. A timeout was performed prior to the initiation of the procedure. During the procedure, an interpreter was present to assist with communication. The right groin was prepped and draped in the usual sterile fashion. Thereafter using modified Seldinger technique, transfemoral access into the right common femoral artery was obtained without difficulty. Over a 0.035 inch guidewire, a 5 French Pinnacle sheath was inserted. Through this, and also  over 0.035 inch guidewire, a 5 [place] JB 1 catheter was advanced to the aortic arch region and selectively positioned in the right common carotid artery, the right subclavian artery, the left common carotid artery and the left vertebral artery. FINDINGS: The left common carotid arteriogram demonstrates the left external carotid artery and its major branches to be  widely patent. The left internal carotid artery just distal to the bulb has a severe pre occlusive 95% plus stenosis secondary to circumferential plaque. Distal to this there is mild poststenotic dilatation. More distally, there slow ascent of contrast to the cranial skull base in the left internal carotid artery which appears normal in caliber. The petrous, cavernous and the supraclinoid left ICA are widely patent. The left middle cerebral artery and the left anterior cerebral artery opacify into the capillary and venous phases. The right common carotid arteriogram demonstrates the origin of the right external carotid artery to be widely patent. There a mild stenosis of the proximal right external carotid artery. Its branches are seen to opacify normally. The right internal carotid artery at the bulb demonstrates an approximately 35-40% stenosis by the NASCET criteria associated with an approximately 5 mm well-defined ulcerated plaque with a resulting pseudoaneurysm measuring approximately 5 mm. No evidence of intraluminal filling defects are seen. The right internal carotid artery distal to this is widely patent. The petrous, cavernous and supraclinoid segments demonstrate wide patency. A right posterior communicating artery is seen opacifying the right posterior cerebral artery distribution. The right middle cerebral artery and the right anterior cerebral artery opacify into the capillary and venous phases. There is mild to moderate stenosis of the proximal inferior division of the right middle cerebral artery, and of the superior division probably representative of intracranial arterial sclerotic plaque. 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 The origin of the right vertebral artery is widely patent. This vessel is seen to opacify to the cranial skull base. There is normal opacification of the hypoplastic right vertebrobasilar  junction and the right posterior-inferior cerebellar artery. More distally, there is opacification of the distal right vertebrobasilar junction, with opacification of the basilar artery and the left posterior cerebral artery and transiently the superior cerebellar arteries. The dominant left vertebral artery has approximately 35-40% stenosis just distal to its origin. The vessel is, otherwise, seen to opacify normally to the cranial skull base. Normal opacification is seen in the left vertebrobasilar junction and the left posterior-inferior cerebellar artery. The basilar artery, the left posterior cerebral artery, the superior cerebellar arteries and the anterior-inferior cerebellar arteries are seen to opacify normally into the capillary and venous phases. Delayed arterial phase demonstrates retrograde opacification of the left posterior parietal cortical and subcortical regions from the leptomeningeal branches arising from the P3 segment of the left posterior cerebral artery. 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 Approximately 35-40% stenosis of the right internal carotid artery just distal to the bulb associated with a 5 mm smooth ulceration/pseudoaneurysm. 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 PLAN: Will discuss findings with patient's neurologist. Electronically Signed   By: Julieanne Cotton M.D.   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 EXAM: IR ANGIO VERTEBRAL SEL VERTEBRAL UNI LEFT MOD SED; BILATERAL COMMON CAROTID AND INNOMINATE ANGIOGRAPHY; IR ANGIO VERTEBRAL SEL SUBCLAVIAN INNOMINATE UNI RIGHT MOD  SED COMPARISON:  CT angiogram the head and neck of 02/12/2018. MEDICATIONS: Heparin 1000 units IV; no antibiotic was administered within 1 hour of the procedure. ANESTHESIA/SEDATION: Versed 1 mg IV; Fentanyl 25 mcg IV.  Hydralazine 20 mg. Moderate Sedation Time:  30 minutes. The patient was continuously monitored during the procedure by the interventional radiology nurse under my direct supervision. CONTRAST:  Isovue 300 approximately 60 mL. FLUOROSCOPY TIME:  Fluoroscopy Time: 10 minutes 18 seconds (793 mGy). COMPLICATIONS: None immediate. TECHNIQUE: Informed written consent was obtained from the patient's niece after a thorough discussion of the procedural risks, benefits and alternatives. All questions were addressed. Maximal Sterile Barrier Technique was utilized including caps, mask, sterile gowns, sterile gloves, sterile drape, hand hygiene and skin antiseptic. A timeout was performed prior to the initiation of the procedure. During the procedure, an interpreter was present to assist with communication. The right groin was prepped and draped in the usual sterile fashion. Thereafter using modified Seldinger technique, transfemoral access into the right common femoral artery was obtained without difficulty. Over a 0.035 inch guidewire, a 5 French Pinnacle sheath was inserted. Through this, and also over 0.035 inch guidewire, a 5 [place] JB 1 catheter was advanced to the aortic arch region and selectively positioned in the right common carotid artery, the right subclavian artery, the left common carotid artery and the left vertebral artery. FINDINGS: The left common carotid arteriogram demonstrates the left external carotid artery and its major branches to be widely patent. The left internal carotid artery just distal to the bulb has a severe pre occlusive 95% plus stenosis secondary to circumferential plaque. Distal to this there is mild poststenotic dilatation. More distally, there slow ascent of contrast to the  cranial skull base in the left internal carotid artery which appears normal in caliber. The petrous, cavernous and the supraclinoid left ICA are widely patent. The left middle cerebral artery and the left anterior cerebral artery opacify into the capillary and venous phases. The right common carotid arteriogram demonstrates the origin of the right external carotid artery to be widely patent. There a mild stenosis of the proximal right external carotid artery. Its branches are seen to opacify normally. The right internal carotid artery at the bulb demonstrates an approximately 35-40% stenosis by the NASCET criteria associated with an approximately 5 mm well-defined ulcerated plaque with a resulting pseudoaneurysm measuring approximately 5 mm. No evidence of intraluminal filling defects are seen. The right internal carotid artery distal to this is widely patent. The petrous, cavernous and supraclinoid segments demonstrate wide patency. A right posterior communicating artery is seen opacifying the right posterior cerebral artery distribution. The right middle cerebral artery and the right anterior cerebral artery opacify into the capillary and venous phases. There is mild to moderate stenosis of the proximal inferior division of the right middle cerebral artery, and of the superior division probably representative of intracranial arterial sclerotic plaque. 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 The origin of the right vertebral artery is widely patent. This vessel is seen to opacify to the cranial skull base. There is normal opacification of the hypoplastic right vertebrobasilar junction and the right posterior-inferior cerebellar artery. More distally, there is opacification of the distal  right vertebrobasilar junction, with opacification of the basilar artery and the left posterior cerebral artery and transiently the superior  cerebellar arteries. The dominant left vertebral artery has approximately 35-40% stenosis just distal to its origin. The vessel is, otherwise, seen to opacify normally to the cranial skull base. Normal opacification is seen in the left vertebrobasilar junction and the left posterior-inferior cerebellar artery. The basilar artery, the left posterior cerebral artery, the superior cerebellar arteries and the anterior-inferior cerebellar arteries are seen to opacify normally into the capillary and venous phases. Delayed arterial phase demonstrates retrograde opacification of the left posterior parietal cortical and subcortical regions from the leptomeningeal branches arising from the P3 segment of the left posterior cerebral artery. 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 Approximately 35-40% stenosis of the right internal carotid artery just distal to the bulb associated with a 5 mm smooth ulceration/pseudoaneurysm. 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 PLAN: Will discuss findings with patient's neurologist. Electronically Signed   By: Julieanne Cotton M.D.   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 EXAM: IR ANGIO VERTEBRAL SEL VERTEBRAL UNI LEFT MOD SED; BILATERAL COMMON CAROTID AND INNOMINATE ANGIOGRAPHY; IR ANGIO VERTEBRAL SEL SUBCLAVIAN INNOMINATE UNI RIGHT MOD SED COMPARISON:  CT angiogram the head and neck of 02/12/2018. MEDICATIONS: Heparin 1000 units IV; no antibiotic was administered within 1 hour of the procedure. ANESTHESIA/SEDATION: Versed 1 mg IV; Fentanyl 25 mcg IV.  Hydralazine 20 mg. Moderate Sedation Time:  30  minutes. The patient was continuously monitored during the procedure by the interventional radiology nurse under my direct supervision. CONTRAST:  Isovue 300 approximately 60 mL. FLUOROSCOPY TIME:  Fluoroscopy Time: 10 minutes 18 seconds (793 mGy). COMPLICATIONS: None immediate. TECHNIQUE: Informed written consent was obtained from the patient's niece after a thorough discussion of the procedural risks, benefits and alternatives. All questions were addressed. Maximal Sterile Barrier Technique was utilized including caps, mask, sterile gowns, sterile gloves, sterile drape, hand hygiene and skin antiseptic. A timeout was performed prior to the initiation of the procedure. During the procedure, an interpreter was present to assist with communication. The right groin was prepped and draped in the usual sterile fashion. Thereafter using modified Seldinger technique, transfemoral access into the right common femoral artery was obtained without difficulty. Over a 0.035 inch guidewire, a 5 French Pinnacle sheath was inserted. Through this, and also over 0.035 inch guidewire, a 5 [place] JB 1 catheter was advanced to the aortic arch region and selectively positioned in the right common carotid artery, the right subclavian artery, the left common carotid artery and the left vertebral artery. FINDINGS: The left common carotid arteriogram demonstrates the left external carotid artery and its major branches to be widely patent. The left internal carotid artery just distal to the bulb has a severe pre occlusive 95% plus stenosis secondary to circumferential plaque. Distal to this there is mild poststenotic dilatation. More distally, there slow ascent of contrast to the cranial skull base in the left internal carotid artery which appears normal in caliber. The petrous, cavernous and the supraclinoid left ICA are widely patent. The left middle cerebral artery and the left anterior cerebral artery opacify into  the capillary and  venous phases. The right common carotid arteriogram demonstrates the origin of the right external carotid artery to be widely patent. There a mild stenosis of the proximal right external carotid artery. Its branches are seen to opacify normally. The right internal carotid artery at the bulb demonstrates an approximately 35-40% stenosis by the NASCET criteria associated with an approximately 5 mm well-defined ulcerated plaque with a resulting pseudoaneurysm measuring approximately 5 mm. No evidence of intraluminal filling defects are seen. The right internal carotid artery distal to this is widely patent. The petrous, cavernous and supraclinoid segments demonstrate wide patency. A right posterior communicating artery is seen opacifying the right posterior cerebral artery distribution. The right middle cerebral artery and the right anterior cerebral artery opacify into the capillary and venous phases. There is mild to moderate stenosis of the proximal inferior division of the right middle cerebral artery, and of the superior division probably representative of intracranial arterial sclerotic plaque. 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 The origin of the right vertebral artery is widely patent. This vessel is seen to opacify to the cranial skull base. There is normal opacification of the hypoplastic right vertebrobasilar junction and the right posterior-inferior cerebellar artery. More distally, there is opacification of the distal right vertebrobasilar junction, with opacification of the basilar artery and the left posterior cerebral artery and transiently the superior cerebellar arteries. The dominant left vertebral artery has approximately 35-40% stenosis just distal to its origin. The vessel is, otherwise, seen to opacify normally to the cranial skull base. Normal opacification is seen in the left vertebrobasilar junction and the  left posterior-inferior cerebellar artery. The basilar artery, the left posterior cerebral artery, the superior cerebellar arteries and the anterior-inferior cerebellar arteries are seen to opacify normally into the capillary and venous phases. Delayed arterial phase demonstrates retrograde opacification of the left posterior parietal cortical and subcortical regions from the leptomeningeal branches arising from the P3 segment of the left posterior cerebral artery. 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 Approximately 35-40% stenosis of the right internal carotid artery just distal to the bulb associated with a 5 mm smooth ulceration/pseudoaneurysm. 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 PLAN: Will discuss findings with patient's neurologist. Electronically Signed   By: Julieanne Cotton M.D.   On: 02/15/2018 12:21     Subjective: -non verbal, appears comfortable   Discharge Exam: Vitals:   02/18/18 0405 02/18/18 0728  BP: (!) 159/80 (!) 153/84  Pulse: 77 72  Resp: 18 17  Temp: 98 F (36.7 C) (!) 97.5 F (36.4 C)  SpO2: 98% 98%    General: Pt is alert, awake, not in acute distress, non verbal  Cardiovascular: RRR, S1/S2 +, no rubs, no gallops Respiratory: CTA bilaterally, no wheezing, no rhonchi Abdominal: Soft, NT, ND, bowel sounds +  The results of significant diagnostics from this hospitalization (including imaging, microbiology, ancillary and laboratory) are listed below for reference.     Microbiology: No results found for this or any previous visit (from the past 240 hour(s)).   Labs: BNP (last 3 results) No results for input(s): BNP in the last 8760 hours. Basic Metabolic Panel: Recent Labs  Lab 02/11/18 1837  02/13/18 0058 02/13/18 0102  02/13/18 0855 02/14/18 0842 02/15/18 0636 02/16/18 0624  NA 138   < > 138 142  --  140 139 139  K 3.8   < > 3.3* 3.4*  --  4.1 4.2 3.8  CL 103   < > 107 104  --  111 112* 106  CO2 25  --  25  --   --  23 23 23   GLUCOSE 113*   < > 112* 109*  --  94 95 102*  BUN 23*   < > 17 20  --  9 7 7   CREATININE 1.57*   < > 1.55* 1.50* 1.07 1.10 1.07 1.06  CALCIUM 9.1  --  8.3*  --   --  8.5* 8.5* 8.8*   < > = values in this interval not displayed.   Liver Function Tests: Recent Labs  Lab 02/11/18 1837 02/13/18 0058 02/14/18 0842  AST 21 18 16   ALT 17 15* 14*  ALKPHOS 74 58 59  BILITOT 0.4 0.4 0.6  PROT 7.8 6.8 6.7  ALBUMIN 3.7 3.1* 3.2*   No results for input(s): LIPASE, AMYLASE in the last 168 hours. No results for input(s): AMMONIA in the last 168 hours. CBC: Recent Labs  Lab 02/11/18 1837  02/13/18 0058 02/13/18 0102 02/13/18 0855 02/14/18 0842 02/16/18 0624  WBC 6.1  --  6.6  --  6.2 6.9 9.4  NEUTROABS 2.7  --  3.6  --   --   --   --   HGB 13.5   < > 12.9* 13.3 13.6 13.2 13.1  HCT 42.2   < > 39.6 39.0 41.4 40.4 39.8  MCV 76.0*  --  75.3*  --  73.7* 74.5* 74.3*  PLT 238  --  221  --  247 247 250   < > = values in this interval not displayed.   Cardiac Enzymes: Recent Labs  Lab 02/13/18 0855 02/13/18 1439 02/13/18 2007  TROPONINI <0.03 <0.03 <0.03   BNP: Invalid input(s): POCBNP CBG: No results for input(s): GLUCAP in the last 168 hours. D-Dimer No results for input(s): DDIMER in the last 72 hours. Hgb A1c No results for input(s): HGBA1C in the last 72 hours. Lipid Profile No results for input(s): CHOL, HDL, LDLCALC, TRIG, CHOLHDL, LDLDIRECT in the last 72 hours. Thyroid function studies No results for input(s): TSH, T4TOTAL, T3FREE, THYROIDAB in the last 72 hours.  Invalid input(s): FREET3 Anemia work up No results for input(s): VITAMINB12, FOLATE, FERRITIN, TIBC, IRON, RETICCTPCT in the last 72 hours. Urinalysis    Component Value Date/Time    COLORURINE STRAW (A) 02/13/2018 0005   APPEARANCEUR CLEAR 02/13/2018 0005   LABSPEC 1.017 02/13/2018 0005   PHURINE 6.0 02/13/2018 0005   GLUCOSEU NEGATIVE 02/13/2018 0005   HGBUR NEGATIVE 02/13/2018 0005   BILIRUBINUR NEGATIVE 02/13/2018 0005   KETONESUR NEGATIVE 02/13/2018 0005   PROTEINUR NEGATIVE 02/13/2018 0005   NITRITE NEGATIVE 02/13/2018 0005   LEUKOCYTESUR NEGATIVE 02/13/2018 0005   Sepsis Labs Invalid input(s): PROCALCITONIN,  WBC,  LACTICIDVEN   Time coordinating discharge: 35 minutes  SIGNED:  Pamella Pert, MD  [facility] 02/18/2018, 11:19 AM Pager [phone]  If 7PM-7AM, please contact night-coverage [email or web address] Password [email or web address]

## 2018-02-18 NOTE — Progress Notes (Signed)
Rehab admissions - Per attending MD, patient has been medically cleared for acute inpatient rehab admission for today.  Bed available and will admit to CIR today.  Call me for questions.  #[phone]

## 2018-02-18 NOTE — Progress Notes (Signed)
Trish Mage, RN  Rehab Admission Coordinator  Physical Medicine and Rehabilitation  PMR Pre-admission  Signed  Date of Service:  02/17/2018 1:19 PM       Related encounter: ED to Hosp-Admission (Current) from 02/12/2018 in [facility]      Signed            Show:Clear all Manual[x] Template[x] Copied  Added by: Standley Brooking, RN[x] Trish Mage, RN   Hover for details   PMR Admission Coordinator Pre-Admission Assessment  Patient: Jorge Burns is an 77 y.o., male MRN: [id number] DOB: Jan 28, 1941 Height:  (167.6 cm) Weight: 57.2 kg (126 lb 1.7 oz)                                                                                                                                      Insurance Information HMO:     PPO:      PCP:      IPA:      80/20:      OTHER: no HMO  PRIMARY: Medicare a and b      Policy#: [id number]      Subscriber: pt Benefits:  Phone #: online Eff. Date: a 03/21/2013 b 11/20/2011     Deduct: $1364      Out of Pocket Max: none      Life Max: none CIR: 100%      SNF: 20 full days Outpatient: 80%     Co-Pay: 20% Home Health: 100%      Co-Pay: none DME: 80%     Co-Pay: 20% Providers: pt choice  SECONDARY: Medicaid not active when called 02/17/18      Policy#: [id number] n      Subscriber: pt  Called medicaid and not active 02/17/18  Medicaid Application Date:       Case Manager:  Disability Application Date:       Case Worker:   Emergency Contact Information         Contact Information    Name Relation Home Work [employer], [place] Niece [phone]  [phone]     Current Medical History  Patient Admitting Diagnosis:  Multifocal left hemisphere infarct  History of Present Illness: a 77 year old right-handed [place] non-English-speaking male with history of reported CVA 2017 patient not on aspirin or statin drug. Per chart review lives with reported niece and assistance as needed. One level home 3  steps to entry. Independent prior to admission and still working. Presented 02/13/2018 with headache, right-sided weakness and slurred speech. Cranial CT scan reviewed unremarkable for acute intracranial process. Per report, old right parietal infarct. Patient did not receive TPA. MRI showed multifocal acute ischemia within the left hemisphere, predominantly within the deep watershed zone and left ACA/MCA. Small subacute infarct within the right side white matter adjacent to the corpus callosum. MRA of the head with no intracranial occlusion or high-grade stenosis. 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 CTA of head and neck showed 50% right ICA with a 3 to 4 mm posterior projecting ulceration. Critical stenosis left ICA. Interventional radiology consulted for cerebral angiogram that showed 95% plus LT ICA proximal stenosis with slow antegrade flow. Approximately 35% stenosis of right ICA proximal with a 5 mm ulcerated plaque. Echocardiogram with ejection fraction of 55% grade 1 diastolic dysfunction. EEG was negative. Carotid Doppler studies 02/17/2018 showing left ICA stenosis 80 -99%. Presently on aspirin and Plavix for CVA prophylaxis. Subcutaneous heparin for DVT prophylaxis. Dysphasia #1 nectar thick liquid diet. Physical and occupational therapy evaluations completed with recommendations of physical medicine rehab consult.  Total: 14 NIHSS  Past Medical History      Past Medical History:  Diagnosis Date  . Stroke [facility])    "a long time ago -- 2 years ago"    Family History  family history is not on file.  Prior Rehab/Hospitalizations:  Has the patient had major surgery during 100 days prior to admission? No  Current Medications   Current Facility-Administered Medications:  .  0.9 %  sodium chloride infusion, , Intravenous, Continuous, Emokpae, Courage, MD, Last Rate: 100 mL/hr at 02/18/18 0441 .  acetaminophen (TYLENOL) tablet 650 mg, 650 mg, Oral, Q4H PRN  **OR** acetaminophen (TYLENOL) solution 650 mg, 650 mg, Per Tube, Q4H PRN **OR** acetaminophen (TYLENOL) suppository 650 mg, 650 mg, Rectal, Q4H PRN, Hugelmeyer, Alexis, DO .  aspirin chewable tablet 81 mg, 81 mg, Oral, Daily, Emokpae, Courage, MD, 81 mg at 02/17/18 1003 .  atorvastatin (LIPITOR) tablet 80 mg, 80 mg, Oral, q1800, Emokpae, Courage, MD, 80 mg at 02/17/18 1805 .  clopidogrel (PLAVIX) tablet 75 mg, 75 mg, Oral, Daily, Emokpae, Courage, MD, 75 mg at 02/17/18 1003 .  fentaNYL (SUBLIMAZE) injection, , Intravenous, PRN, Julieanne Cotton, MD, 25 mcg at 02/15/18 1111 .  glycopyrrolate (ROBINUL) injection 0.2 mg, 0.2 mg, Intravenous, BID, Emokpae, Courage, MD, 0.2 mg at 02/17/18 2148 .  heparin injection 5,000 Units, 5,000 Units, Subcutaneous, Q8H, Emokpae, Courage, MD, 5,000 Units at 02/18/18 0601 .  heparin injection, , , PRN, Julieanne Cotton, MD, 1,000 Units at 02/15/18 1121 .  hydrALAZINE (APRESOLINE) injection 10 mg, 10 mg, Intravenous, Q6H PRN, Mariea Clonts, Courage, MD .  hydrALAZINE (APRESOLINE) injection, , , PRN, Julieanne Cotton, MD, 5 mg at 02/15/18 1203 .  lidocaine (XYLOCAINE) 1 % (with pres) injection, , Infiltration, PRN, Deveshwar, Sanjeev, MD, 10 mL at 02/15/18 1113 .  midazolam (VERSED) injection, , Intravenous, PRN, Deveshwar, Sanjeev, MD, 1 mg at 02/15/18 1111 .  senna-docusate (Senokot-S) tablet 1 tablet, 1 tablet, Oral, QHS PRN, Hugelmeyer, Alexis, DO, 1 tablet at 02/16/18 2210  Patients Current Diet:       Diet Order           DIET - DYS 1 Room service appropriate? Yes; Fluid consistency: Nectar Thick  Diet effective now          Precautions / Restrictions Precautions Precautions: Fall Restrictions Weight Bearing Restrictions: No   Has the patient had 2 or more falls or a fall with injury in the past year?No  Prior Activity Level Community (5-7x/wk): independent and active pta; no AD  Journalist, newspaper / Equipment Home Equipment:  None  Prior Device Use: Indicate devices/aids used by the patient prior to current illness, exacerbation or injury? 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  Independent  Indoor Mobility: Did the patient need assistance with walking from room to room (with or without device)? independent  Stairs: Did the patient need assistance with internal or external stairs (with or without device)? Independent  Functional Cognition: Did the patient need help planning regular tasks such as shopping or remembering to take medications? Independent  Current Functional Level Cognition  Arousal/Alertness: Awake/alert Overall Cognitive Status: Impaired/Different from baseline Orientation Level: Oriented to person Following Commands: Follows one step commands inconsistently, Follows one step commands with increased time Safety/Judgement: Decreased awareness of safety, Decreased awareness of deficits General Comments: family members present and interpreted during session; pt did not try to verbalize answers to family member and most of the time did not nod head yes/no when asked however pt did assist with tasks without cues  Attention: Focused Focused Attention: Appears intact    Extremity Assessment (includes Sensation/Coordination)  Upper Extremity Assessment: RUE deficits/detail RUE Deficits / Details: impaired tone, minimal tone exhibited during PROM in seated position.  RUE Coordination: decreased fine motor, decreased gross motor  Lower Extremity Assessment: Defer to PT evaluation RLE Deficits / Details: lifts antigravity with increased time, holds knee extension to some resistance; does note attend well to R side RLE Coordination: decreased gross motor    ADLs  Overall ADL's : Needs  assistance/impaired Eating/Feeding: NPO Grooming: Moderate assistance, Maximal assistance, Sitting Grooming Details (indicate cue type and reason): difficulty with cognitive piece of grooming task this session Upper Body Bathing: Moderate assistance, Sitting Lower Body Bathing: Maximal assistance Lower Body Bathing Details (indicate cue type and reason): min A +2 sit<>stand Upper Body Dressing : Maximal assistance, Sitting Lower Body Dressing: Total assistance Lower Body Dressing Details (indicate cue type and reason): min A +2 sit<>stand Toilet Transfer: Moderate assistance, +2 for physical assistance, Stand-pivot Toilet Transfer Details (indicate cue type and reason): Bil HHA; simulated with EOB>recliner Toileting- Clothing Manipulation and Hygiene: Total assistance Toileting - Clothing Manipulation Details (indicate cue type and reason): mod A +2 sit<>stand General ADL Comments: Pt completed bed mobility, pericare, 2x sit<>stand then SPT to recliner as detailed above.     Mobility  Overal bed mobility: Needs Assistance Bed Mobility: Sit to Supine Supine to sit: Mod assist Sit to supine: Mod assist General bed mobility comments: assistance needed to bring R LE into bed and for postioning     Transfers  Overall transfer level: Needs assistance Equipment used: 2 person hand held assist Transfer via Lift Equipment: Stedy Transfers: Sit to/from Stand Sit to Stand: Mod assist, +2 physical assistance, Min assist Stand pivot transfers: Mod assist, +2 physical assistance, Max assist General transfer comment: pt stood X3 once with with +2 HHA and next 2 trials with [facility]; pt required less assistance for stand using Stedy; assistance needed for balance in standing due to R lateral lean     Ambulation / Gait / Stairs / Wheelchair Mobility  Ambulation/Gait Ambulation/Gait assistance: Min assist, +2 physical assistance Ambulation Distance (Feet): 40 Feet Assistive device: 2 person hand  held assist Gait Pattern/deviations: Step-through pattern, Decreased stride length, Decreased weight shift to right, Drifts right/left General Gait Details: leads with L side of his body needs assist for heavy R arm and for balance (falls to R and back without UE support)    Posture / Balance Dynamic Sitting Balance Sitting balance - Comments: close min guard to min A, static sitting Balance Overall balance assessment: Needs assistance Sitting-balance support: Single  extremity supported, Feet supported Sitting balance-Leahy Scale: Fair Sitting balance - Comments: close min guard to min A, static sitting Postural control: Posterior lean, Right lateral lean Standing balance support: Bilateral upper extremity supported Standing balance-Leahy Scale: Poor Standing balance comment: R lateral lean; pt able to correct standing posture to midline with mutlimocal cues    Special needs/care consideration BiPAP/CPAP  N/a CPM  N/a Continuous Drip IV  N/a Dialysis  N/a Life Vest  N/a Oxygen  N/a Special Bed  N/a Trach Size  N/a Wound Vac n/a Skin N/a                               Bowel mgmt: incontinent, Last BM 02/17/18 Bladder mgmt: incontinent; external catheter Diabetic mgmt Hgb A1c 5.7    Previous Home Environment Living Arrangements: Other (Comment)(lives with his "niece" [name] and her family; [place] call [place]  Lives With: Friend(s) Available Help at Discharge: ([name] states when she is in class in the morning, she will hav) Type of Home: House Home Layout: One level Home Access: Stairs to enter [employer] of Steps: 3 Bathroom Shower/Tub: Health visitor: Standard Bathroom Accessibility: Yes How Accessible: Accessible via walker Home Care Services: No  [name] calls him Grandpa. When she is in class, she will have friends to stay with him. Class 0830 until 1 Monday through Thursday  Discharge Living Setting Plans for Discharge Living Setting: Lives  with (comment)(friends, [name] and her family) Type of Home at Discharge: House Discharge Home Layout: One level Discharge Home Access: Stairs to enter Entrance Stairs-Rails: None Entrance Stairs-Number of Steps: 3 Discharge Bathroom Shower/Tub: Garment/textile technologist: Standard Discharge Bathroom Accessibility: Yes How Accessible: Accessible via walker Does the patient have any problems obtaining your medications?: No  Social/Family/Support Systems Patient Roles: Adult nurse Information: [name], "niece" Anticipated Caregiver: [name] and friends Anticipated Caregiver's Contact Information: see above Ability/Limitations of Caregiver: [name] is in class 830 until 1 monday through Thursday Caregiver Availability: 24/7([name] will get friends to assist) Discharge Plan Discussed with Primary Caregiver: Yes Is Caregiver In Agreement with Plan?: Yes Does Caregiver/Family have Issues with Lodging/Transportation while Pt is in Rehab?: No  Goals/Additional Needs Patient/Family Goal for Rehab: supervision to min assist with PT, OT, and SLP Expected length of stay: [facility] 10- 14 days Cultural Considerations: [place] needs interpreter Special Service Needs: Needs interpreter Pt/Family Agrees to Admission and willing to participate: Yes Program Orientation Provided & Reviewed with Pt/Caregiver Including Roles  & Responsibilities: Yes  Decrease burden of Care through IP rehab admission: n/a  Possible need for SNF placement upon discharge: not anticipated  Patient Condition: This patient's medical and functional status has changed since the consult dated: 02/15/18 in which the Rehabilitation Physician determined and documented that the patient's condition is appropriate for intensive rehabilitative care in an inpatient rehabilitation facility. See "History of Present Illness" (above) for medical update. Functional changes are: Currently requiring mod assist +2 for transfers.  Patient's medical and functional status update has been discussed with the Rehabilitation physician and patient remains appropriate for inpatient rehabilitation. Will admit to inpatient rehab today.  Preadmission Screen Completed By: Ottie Glazier, RN, MSN with updates by Trish Mage, 02/18/2018 11:02 AM ______________________________________________________________________   Discussed status with Dr. Riley Kill on 02/18/18 at 1105 and received telephone approval for admission today.  Admission Coordinator:  Trish Mage, time 1105/Date 02/18/18  Cosigned by: Ranelle Oyster, MD at 02/18/2018 11:31 AM  Revision History

## 2018-02-18 NOTE — Progress Notes (Addendum)
Pt admitted to unit at 1815 with no family at bedside. Pt unable to communicate to staff and unable to follow commands due to language barrier. Admission unable to be completed at this time and will pass to next shift for admission questions to be completed when caregiver arrives tomorrow. Condom cath placed, bed alarm on and side rails x 3 in place.  Rhonchi present with strong congestive cough.

## 2018-02-18 NOTE — Consult Note (Signed)
Chief Complaint: Patient was seen in consultation today for proximal left ICA stenosis.  Supervising Physician: Julieanne Cotton  Patient Status: [facility] - In-pt  History of Present Illness: Jorge Burns is a 77 y.o. 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 He presented to ED 02/13/2018 with complaints of left parietal headache with associated nausea, dizziness, and generalized weakness. Code stroke was initiated and Dr. Corliss Skains was consulted for a diagnostic cerebral angiogram.  Diagnostic cerebral angiogram 02/15/2018 with Dr. Corliss Skains: 1. 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 2. Approximately 35% stenosis of right ICA proximal with a 5mm smoth ulcerated plaque. 3. Approximately 40% stenosis of left VA origin. 4. 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  Patient is currently admitted at [facility] and not present for consultation. He is from [place] and does not speak English, but his granddaughter, Jorge Burns, does. She presents today for consultation regarding patient's proximal left ICA stenosis.  Patient is currently taking Plavix 75 mg once daily and Aspirin 81 mg once daily.  Past Medical History:  Diagnosis Date  . Stroke [facility])    "a long time ago -- 2 years ago"    Past Surgical History:  Procedure Laterality Date  . IR ANGIO INTRA EXTRACRAN SEL COM CAROTID INNOMINATE BILAT MOD SED  02/15/2018  . IR ANGIO VERTEBRAL SEL SUBCLAVIAN INNOMINATE UNI R MOD SED  02/15/2018  . IR ANGIO VERTEBRAL SEL VERTEBRAL UNI L MOD SED  02/15/2018    Allergies: Patient has no known allergies.  Medications: Prior to Admission medications   Medication Sig Start Date End Date Taking? Authorizing Provider  butalbital-acetaminophen-caffeine (FIORICET, ESGIC) [phone] MG tablet Take 1-2 tablets by mouth every 8 (eight) hours as needed for headache. 02/12/18 02/12/19 Yes Antony Madura, PA-C     No family history on file.  Social  History   Socioeconomic History  . Marital status: Single    Spouse name: Not on file  . Number of children: Not on file  . Years of education: Not on file  . Highest education level: Not on file  Occupational History  . Not on file  Social Needs  . Financial resource strain: Not on file  . Food insecurity:    Worry: Not on file    Inability: Not on file  . Transportation needs:    Medical: Not on file    Non-medical: Not on file  Tobacco Use  . Smoking status: Never Smoker  . Smokeless tobacco: Never Used  Substance and Sexual Activity  . Alcohol use: No  . Drug use: No  . Sexual activity: Not on file  Lifestyle  . Physical activity:    Days per week: Not on file    Minutes per session: Not on file  . Stress: Not on file  Relationships  . Social connections:    Talks on phone: Not on file    Gets together: Not on file    Attends religious service: Not on file    Active member of club or organization: Not on file    Attends meetings of clubs or organizations: Not on file    Relationship status: Not on file  Other Topics Concern  . Not on file  Social History Narrative  . Not on file     Review of Systems: A 12 point ROS discussed and pertinent positives are indicated in the HPI above.  All other systems are negative.  Review  of Systems  Constitutional:       Patient not present for consultation.    Vital Signs: BP (!) 153/84 (BP Location: Left Arm)   Pulse 72   Temp (!) 97.5 F (36.4 C) (Oral)   Resp 17   Ht 5\' 6"  (1.676 m)   Wt 126 lb 1.7 oz (57.2 kg)   SpO2 98%   BMI 20.35 kg/m   Physical Exam  Constitutional:  Patient not present for consultation.     Imaging: Dg Chest 2 View  Result Date: 02/13/2018 CLINICAL DATA:  TIA. EXAM: CHEST - 2 VIEW COMPARISON:  12/08/2015. FINDINGS: Mild cardiac enlargement. Aortic atherosclerotic calcifications noted. There is scarring and volume loss involving the left lung. Unchanged from comparison exam. No  superimposed airspace consolidation, pulmonary edema, or pleural effusion. Pleuroparenchymal scarring and calcification overlying the left apex is similar to previous exam. IMPRESSION: 1. No acute cardiopulmonary abnormalities. 2.  Aortic Atherosclerosis (ICD10-I70.0). Electronically Signed   By: Signa Kell M.D.   On: 02/13/2018 09:00   Dg Abd 1 View  Result Date: 02/13/2018 CLINICAL DATA:  Screening for metal prior to MRI. EXAM: ABDOMEN - 1 VIEW COMPARISON:  None. FINDINGS: Metallic fragments are demonstrated in the soft tissues over the right proximal femur and in the left upper quadrant overlying the twelfth distal rib. Scattered gas and stool throughout the colon. No small or large bowel distention. No radiopaque stones. Degenerative changes in the spine. Soft tissue contours appear intact. IMPRESSION: Metallic foreign bodies are demonstrated in the soft tissues over the right hip and in the left upper quadrant. Electronically Signed   By: Burman Nieves M.D.   On: 02/13/2018 04:56   Ct Head Wo Contrast  Result Date: 02/16/2018 CLINICAL DATA:  77 year old male with patchy acute left hemisphere infarcts found to have critical left ICA stenosis. EXAM: CT HEAD WITHOUT CONTRAST TECHNIQUE: Contiguous axial images were obtained from the base of the skull through the vertex without intravenous contrast. COMPARISON:  Diagnostic cerebral angiogram 02/15/2018. Brain MRI and CTA head and neck 02/13/2018. Head CT 02/12/2018. FINDINGS: Brain: Progressed patchy hypodensity in the left MCA and left MCA/ACA watershed territory corresponding to areas of restricted diffusion on 02/13/2018. No associated hemorrhage or mass effect. Superimposed chronic posterior right temporal lobe and right parietal lobe encephalomalacia. No new cortically based infarct identified. Stable ventricle size and configuration. No intracranial mass effect Vascular: Calcified atherosclerosis at the skull base. No suspicious intracranial  vascular hyperdensity. Skull: No acute osseous abnormality identified. Sinuses/Orbits: Stable paranasal sinuses. Other: Stable orbit and scalp soft tissues. IMPRESSION: 1. Expected evolution of the Left MCA and MCA/ACA watershed territory infarcts seen on 02/13/2018 with no associated hemorrhage or mass effect. 2. Chronic right MCA infarcts. 3. No new intracranial abnormality. Electronically Signed   By: Odessa Fleming M.D.   On: 02/16/2018 15:04   Ct Head Wo Contrast  Result Date: 02/12/2018 CLINICAL DATA:  On Thursday, patient was evaluated for speech difficulty, left-sided weakness, and left eye visual changes. Now today patient presents with right-sided facial droop, abnormal speech, and slurring. EXAM: CT HEAD WITHOUT CONTRAST TECHNIQUE: Contiguous axial images were obtained from the base of the skull through the vertex without intravenous contrast. COMPARISON:  02/11/2018 FINDINGS: Brain: Mild diffuse cerebral atrophy. Focal area of encephalomalacia involving the right anterior parietal lobe consistent with old infarct. Patchy low-attenuation changes in the deep white matter consistent with small vessel ischemia. Old lacunar infarct in the left caudate. No mass-effect or midline shift. No abnormal  extra-axial fluid collections. Gray-white matter junctions are distinct. Basal cisterns are not effaced. No acute intracranial hemorrhage. Vascular: Intracranial arterial vascular calcifications are present. Skull: Calvarium appears intact. Sinuses/Orbits: Mucosal thickening in the paranasal sinuses. No acute air-fluid levels. Hypoaeration of the right mastoid air cells. Other: None. IMPRESSION: No acute intracranial abnormalities. Chronic atrophy and small vessel ischemic changes. Old right parietal infarct. Electronically Signed   By: Burman Nieves M.D.   On: 02/12/2018 23:57   Ct Head Wo Contrast  Result Date: 02/11/2018 CLINICAL DATA:  Headache and dizziness with blurry vision EXAM: CT HEAD WITHOUT CONTRAST  TECHNIQUE: Contiguous axial images were obtained from the base of the skull through the vertex without intravenous contrast. COMPARISON:  11/28/2015 head CT FINDINGS: Brain: No acute territorial infarction, hemorrhage or intracranial mass. Encephalomalacia in the right temporal lobe as before. Probable tiny focus of encephalomalacia in the high right parietal lobe, probably unchanged compared with 2017 comparison head CT. Mild atrophy. Stable ventricle size with mild ex vacuo dilatation of right lateral ventricle. Vascular: No hyperdense vessels.  Carotid vascular calcification Skull: Normal. Negative for fracture or focal lesion. Sinuses/Orbits: Mucosal thickening in the ethmoid sinuses. No acute orbital abnormality. Other: None IMPRESSION: 1. No definite CT evidence for acute intracranial abnormality. 2. Atrophy and right temporal lobe encephalomalacia. Electronically Signed   By: Jasmine Pang M.D.   On: 02/11/2018 20:05   Ct Angio Neck W Or Wo Contrast  Result Date: 02/13/2018 CLINICAL DATA:  Akinetic mediastinum. RIGHT facial weakness. RIGHT upper extremity weakness EXAM: CT ANGIOGRAPHY NECK TECHNIQUE: Multidetector CT imaging of the neck was performed using the standard protocol during bolus administration of intravenous contrast. Multiplanar CT image reconstructions and MIPs were obtained to evaluate the vascular anatomy. Carotid stenosis measurements (when applicable) are obtained utilizing NASCET criteria, using the distal internal carotid diameter as the denominator. CONTRAST:  50mL ISOVUE-370 IOPAMIDOL (ISOVUE-370) INJECTION 76% COMPARISON:  MRI brain earlier today demonstrates multiple LEFT-sided infarcts FINDINGS: Aortic arch: Standard branching. Imaged portion shows no evidence of aneurysm or dissection. No significant stenosis of the major arch vessel origins. Aortic atherosclerosis with calcification and intimal plaque. Right carotid system: Heavily calcified plaque begins in the distal common  carotid artery just before the bifurcation. There is calcified and noncalcified plaque at the origin of the RIGHT internal carotid artery. Suspected 3-4 mm posterior ulceration. 50% stenosis of the proximal RIGHT ICA, based on luminal measurements of 2.0/3.9 proximal/distal. No dissection. Left carotid system: Calcific and noncalcified plaque at the origin of the LEFT ICA. There is a critical stenosis just above the bifurcation, with a lumen too small to reliably measure, less than 1 mm diameter as seen on series 7, image 96. There is slightly reduced caliber, at least compared to the RIGHT side, of the LEFT cervical ICA, 3 mm diameter, further suggesting a proximal stenosis of hemodynamic significance. No dissection. Vertebral arteries: BILATERAL patent. LEFT dominant. No ostial narrowing of significance. Skeleton: Cervical spondylosis.  No worrisome osseous lesion. Other neck: No airway narrowing. 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 Thyroid sonography recommended. Poor dentition. Upper chest: There is scarring and volume loss in the LEFT hemithorax. There is a possible fungus ball, 11 x 11 mm within a LEFT apical cavity. Pneumomediastinum is present of uncertain etiology. RIGHT apical scarring. 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 This could serve as a source of distal emboli. Critical stenosis LEFT ICA. Diminished caliber of the distal cervical  segment. Surgical and/or neuro interventional consultation may be warranted. Findings discussed with neurology PA. LEFT apical scarring, possible fungus ball, along with pneumomediastinum of uncertain significance. Chronic or acute tuberculous infection cannot completely be excluded. Formal chest CT is warranted for further evaluation. Electronically Signed   By: Elsie Stain M.D.   On: 02/13/2018 17:04   Ct Chest Wo Contrast  Result Date: 02/16/2018 CLINICAL DATA:  Pneumonia.  Nonproductive  cough. EXAM: CT CHEST WITHOUT CONTRAST TECHNIQUE: Multidetector CT imaging of the chest was performed following the standard protocol without IV contrast. COMPARISON:  None. FINDINGS: Cardiovascular: Atherosclerotic calcifications of the ascending aorta, aortic arch, and descending thoracic aorta are noted. There is no evidence of acute intramural hematoma within the aorta. Maximal diameter of the ascending aorta is 3.4 cm. Minimal coronary artery calcification. Mild aortic valve calcifications. Mediastinum/Nodes: No pericardial effusion. No abnormal mediastinal adenopathy. Large densely calcified nodule in the left lobe of the thyroid gland is noted. Esophagus is within normal limits. Lungs/Pleura: No pneumothorax. No pleural effusion. There are dense calcifications along the pleura of the posterior left lower hemithorax associated with mild pleural thickening. There is minimal pleural thickening and calcification in the posterior right hemithorax. There is a 1.6 x 3.4 cm cavitary lesion at the left lung apex on image 30 containing a lobulated 1.6 cm round density. This may represent a fungus ball within a cavitary lung lesion or possibly a 1.6 cm nodule within a cavitary mass. There are associated linear opacities. There are a number of satellite nodules in the left upper lobe. 8 mm nodule in the left upper lobe on image 33. 5 mm nodule in the left upper lobe on image 42. There is also a small spiculated nodule in the right upper lobe measuring 7 mm on image 34. Scattered linear scarring in the lungs. There are linear opacities and reticulonodular opacities towards the lung bases left greater than right. There is some bronchiolectasis. There is some mucoid filling of left lower lobe airways. Upper Abdomen: No acute abnormality. Musculoskeletal: No vertebral compression deformity. IMPRESSION: There is a large cavitary lung lesion at the left apex containing a 1.6 cm nodular density. This finding is nonspecific.  Differential diagnosis includes reactivation tuberculosis, a fungal ball within a cavitary lung lesion, or malignancy. Correlate clinically as for the need for further imaging and sputum analysis. PET-CT may be helpful. There are several small satellite nodules in the left upper lobe. There is a 7 mm spiculated nodule in the right upper lobe. Malignancy is not excluded. Reticulonodular opacities at the left base may represent an inflammatory process. There is associated mucoid filling of small airways at the left base and bronchiolectasis. Large densely calcified nodule in the left lobe of the thyroid gland. Aortic Atherosclerosis (ICD10-I70.0). Electronically Signed   By: Jolaine Click M.D.   On: 02/16/2018 14:45   Mr Angiogram Head Wo Contrast  Result Date: 02/13/2018 CLINICAL DATA:  Acute onset weakness EXAM: MR HEAD WITHOUT CONTRAST MR CIRCLE OF WILLIS WITHOUT CONTRAST MRA OF THE NECK WITHOUT AND WITH CONTRAST TECHNIQUE: Multiplanar, multiecho pulse sequences of the brain, circle of willis and surrounding structures were obtained without intravenous contrast. Angiographic images of the neck were obtained using MRA technique without and with intravenous contrast. CONTRAST:  12 mL gadobenate dimeglumine (MULTIHANCE) injection COMPARISON:  Head CT 02/12/2018 FINDINGS: MRI HEAD FINDINGS Brain: The midline structures are normal. There are numerous foci of abnormal diffusion restriction within the left hemisphere, predominantly within the cortical and deep watershed  zones. There is a focus mildly hyperintense DWI signal at the right aspect of the corpus callosum splenium without corresponding ADC abnormality, likely a subacute infarct. There is no midline shift or mass effect. Old right parietal and temporal infarcts. Multifocal hyperintense T2-weighted signal in the white matter compatible with chronic ischemic microangiopathy. No mass lesion. No chronic microhemorrhage or cerebral amyloid angiopathy. No  hydrocephalus, age advanced atrophy or lobar predominant volume loss. No dural abnormality or extra-axial collection. Skull and upper cervical spine: The visualized skull base, calvarium, upper cervical spine and extracranial soft tissues are normal. Sinuses/Orbits: Right mastoid effusion normal orbits. MRA HEAD FINDINGS Intracranial internal carotid arteries: Normal. Anterior cerebral arteries: Normal. Middle cerebral arteries: Normal. Posterior communicating arteries: Present on the right. Posterior cerebral arteries: Normal. Basilar artery: Normal. Vertebral arteries: Left dominant. Normal. Superior cerebellar arteries: Normal. Anterior inferior cerebellar arteries: Normal. Posterior inferior cerebellar arteries: Normal. MRA NECK FINDINGS Aortic arch: Normal 3 vessel aortic branching pattern. The visualized subclavian arteries are normal. Right carotid system: There is a small rounded focus measuring 4 x 3 mm projecting posteriorly from the proximal right internal carotid artery. There is no hemodynamically significant right carotid stenosis. Left carotid system: There is complete loss of contrast enhancement within the proximal left internal carotid artery over a short segment. The remainder of the left internal carotid artery is normal. Vertebral arteries: Left dominant. Vertebral artery origins are normal. Vertebral arteries are normal in course and caliber to the vertebrobasilar confluence without stenosis or evidence of dissection. IMPRESSION: 1. Multifocal acute ischemia within the left hemisphere, predominantly within the deep watershed zone and left ACA/MCA cortical watershed zone. No hemorrhage or mass effect. 2. Small subacute infarct within the right-sided white matter adjacent to the corpus callosum splenium. 3. No intracranial occlusion or high-grade stenosis. 4. Loss of enhancement of the proximal left internal carotid artery over a short segment with near-immediate reconstitution. This indicates  either critical stenosis or occlusion. 5. 4 x 3 mm rounded focus projecting posteriorly from the proximal right internal carotid artery, most consistent with a small aneurysm. This might be artifactual secondary to the presence of atherosclerotic plaque at the bifurcation. If clinically warranted, this could be further characterized with CTA of the neck. Electronically Signed   By: Deatra Robinson M.D.   On: 02/13/2018 06:21   Mr Angiogram Neck W Or Wo Contrast  Result Date: 02/13/2018 CLINICAL DATA:  Acute onset weakness EXAM: MR HEAD WITHOUT CONTRAST MR CIRCLE OF WILLIS WITHOUT CONTRAST MRA OF THE NECK WITHOUT AND WITH CONTRAST TECHNIQUE: Multiplanar, multiecho pulse sequences of the brain, circle of willis and surrounding structures were obtained without intravenous contrast. Angiographic images of the neck were obtained using MRA technique without and with intravenous contrast. CONTRAST:  12 mL gadobenate dimeglumine (MULTIHANCE) injection COMPARISON:  Head CT 02/12/2018 FINDINGS: MRI HEAD FINDINGS Brain: The midline structures are normal. There are numerous foci of abnormal diffusion restriction within the left hemisphere, predominantly within the cortical and deep watershed zones. There is a focus mildly hyperintense DWI signal at the right aspect of the corpus callosum splenium without corresponding ADC abnormality, likely a subacute infarct. There is no midline shift or mass effect. Old right parietal and temporal infarcts. Multifocal hyperintense T2-weighted signal in the white matter compatible with chronic ischemic microangiopathy. No mass lesion. No chronic microhemorrhage or cerebral amyloid angiopathy. No hydrocephalus, age advanced atrophy or lobar predominant volume loss. No dural abnormality or extra-axial collection. Skull and upper cervical spine: The visualized skull base, calvarium,  upper cervical spine and extracranial soft tissues are normal. Sinuses/Orbits: Right mastoid effusion normal  orbits. MRA HEAD FINDINGS Intracranial internal carotid arteries: Normal. Anterior cerebral arteries: Normal. Middle cerebral arteries: Normal. Posterior communicating arteries: Present on the right. Posterior cerebral arteries: Normal. Basilar artery: Normal. Vertebral arteries: Left dominant. Normal. Superior cerebellar arteries: Normal. Anterior inferior cerebellar arteries: Normal. Posterior inferior cerebellar arteries: Normal. MRA NECK FINDINGS Aortic arch: Normal 3 vessel aortic branching pattern. The visualized subclavian arteries are normal. Right carotid system: There is a small rounded focus measuring 4 x 3 mm projecting posteriorly from the proximal right internal carotid artery. There is no hemodynamically significant right carotid stenosis. Left carotid system: There is complete loss of contrast enhancement within the proximal left internal carotid artery over a short segment. The remainder of the left internal carotid artery is normal. Vertebral arteries: Left dominant. Vertebral artery origins are normal. Vertebral arteries are normal in course and caliber to the vertebrobasilar confluence without stenosis or evidence of dissection. IMPRESSION: 1. Multifocal acute ischemia within the left hemisphere, predominantly within the deep watershed zone and left ACA/MCA cortical watershed zone. No hemorrhage or mass effect. 2. Small subacute infarct within the right-sided white matter adjacent to the corpus callosum splenium. 3. No intracranial occlusion or high-grade stenosis. 4. Loss of enhancement of the proximal left internal carotid artery over a short segment with near-immediate reconstitution. This indicates either critical stenosis or occlusion. 5. 4 x 3 mm rounded focus projecting posteriorly from the proximal right internal carotid artery, most consistent with a small aneurysm. This might be artifactual secondary to the presence of atherosclerotic plaque at the bifurcation. If clinically warranted,  this could be further characterized with CTA of the neck. Electronically Signed   By: Deatra Robinson M.D.   On: 02/13/2018 06:21   Mr Brain Wo Contrast  Result Date: 02/13/2018 CLINICAL DATA:  Acute onset weakness EXAM: MR HEAD WITHOUT CONTRAST MR CIRCLE OF WILLIS WITHOUT CONTRAST MRA OF THE NECK WITHOUT AND WITH CONTRAST TECHNIQUE: Multiplanar, multiecho pulse sequences of the brain, circle of willis and surrounding structures were obtained without intravenous contrast. Angiographic images of the neck were obtained using MRA technique without and with intravenous contrast. CONTRAST:  12 mL gadobenate dimeglumine (MULTIHANCE) injection COMPARISON:  Head CT 02/12/2018 FINDINGS: MRI HEAD FINDINGS Brain: The midline structures are normal. There are numerous foci of abnormal diffusion restriction within the left hemisphere, predominantly within the cortical and deep watershed zones. There is a focus mildly hyperintense DWI signal at the right aspect of the corpus callosum splenium without corresponding ADC abnormality, likely a subacute infarct. There is no midline shift or mass effect. Old right parietal and temporal infarcts. Multifocal hyperintense T2-weighted signal in the white matter compatible with chronic ischemic microangiopathy. No mass lesion. No chronic microhemorrhage or cerebral amyloid angiopathy. No hydrocephalus, age advanced atrophy or lobar predominant volume loss. No dural abnormality or extra-axial collection. Skull and upper cervical spine: The visualized skull base, calvarium, upper cervical spine and extracranial soft tissues are normal. Sinuses/Orbits: Right mastoid effusion normal orbits. MRA HEAD FINDINGS Intracranial internal carotid arteries: Normal. Anterior cerebral arteries: Normal. Middle cerebral arteries: Normal. Posterior communicating arteries: Present on the right. Posterior cerebral arteries: Normal. Basilar artery: Normal. Vertebral arteries: Left dominant. Normal. Superior  cerebellar arteries: Normal. Anterior inferior cerebellar arteries: Normal. Posterior inferior cerebellar arteries: Normal. MRA NECK FINDINGS Aortic arch: Normal 3 vessel aortic branching pattern. The visualized subclavian arteries are normal. Right carotid system: There is a small rounded focus measuring  4 x 3 mm projecting posteriorly from the proximal right internal carotid artery. There is no hemodynamically significant right carotid stenosis. Left carotid system: There is complete loss of contrast enhancement within the proximal left internal carotid artery over a short segment. The remainder of the left internal carotid artery is normal. Vertebral arteries: Left dominant. Vertebral artery origins are normal. Vertebral arteries are normal in course and caliber to the vertebrobasilar confluence without stenosis or evidence of dissection. IMPRESSION: 1. Multifocal acute ischemia within the left hemisphere, predominantly within the deep watershed zone and left ACA/MCA cortical watershed zone. No hemorrhage or mass effect. 2. Small subacute infarct within the right-sided white matter adjacent to the corpus callosum splenium. 3. No intracranial occlusion or high-grade stenosis. 4. Loss of enhancement of the proximal left internal carotid artery over a short segment with near-immediate reconstitution. This indicates either critical stenosis or occlusion. 5. 4 x 3 mm rounded focus projecting posteriorly from the proximal right internal carotid artery, most consistent with a small aneurysm. This might be artifactual secondary to the presence of atherosclerotic plaque at the bifurcation. If clinically warranted, this could be further characterized with CTA of the neck. Electronically Signed   By: Deatra Robinson M.D.   On: 02/13/2018 06:21   Dg Swallowing Func-speech Pathology  Result Date: 02/16/2018 Objective Swallowing Evaluation: Type of Study: MBS-Modified Barium Swallow Study  Patient Details Name: Khadeem Rockett  MRN: [id number] Date of Birth: [birth year] Today's Date: 02/16/2018 Time: SLP Start Time (ACUTE ONLY): 1340 -SLP Stop Time (ACUTE ONLY): 1400 SLP Time Calculation (min) (ACUTE ONLY): 20 min Past Medical History: Past Medical History: Diagnosis Date . Stroke [facility])   "a long time ago -- 2 years ago" Past Surgical History: Past Surgical History: Procedure Laterality Date . IR ANGIO INTRA EXTRACRAN SEL COM CAROTID INNOMINATE BILAT MOD SED  02/15/2018 . IR ANGIO VERTEBRAL SEL SUBCLAVIAN INNOMINATE UNI R MOD SED  02/15/2018 . IR ANGIO VERTEBRAL SEL VERTEBRAL UNI L MOD SED  02/15/2018 HPI: Mr. Antowan Samford is a 77 y.o. male with history of hypertension and a previous stroke presenting with right sided weakness and AMS. He did not receive IV t-PA due to late presentation. MRI showed multifocal acute ischemia within the left hemisphere due to severe left carotid stenosis. Pt initially passed stroke swallow screen, referred for swallow evaluation due to MD, RN concerns; MD downgraded to D1/nectar.  Repeat MRI 5/29 Expected evolution of the Left MCA and MCA/ACA watershed territory infarcts seen on 02/13/2018 with no associated hemorrhage or mass effect.  Subjective: Alert; interpretor present for study Assessment / Plan / Recommendation CHL IP CLINICAL IMPRESSIONS 02/16/2018 Clinical Impression Pt presents with a moderate sensorimotor dysphagia marked by decreased bolus control/cohesion with anterior loss and right buccal residue post-swallow; decreased pharyngeal squeeze and reduced tongue base contact with pharyngeal walls, leading to vallecular residue post-swallow; silent aspiration of thin liquids with material entering posterior larynx/trachea before onset of pharyngeal swallow.  Due to aphasia, despite interpreter present to assist with communication, pt unable to follow commands for postural adjustments/swallow modifications.  Recommend continuing downgrading diet to dysphagia 1 for now; continue nectar thick liquids; give meds  crushed in puree due to residue.  SLP will follow for safety/education and diet progression.  Interpreter relayed results of study to pt - aphasia compromises pt's comprehension.  SLP Visit Diagnosis Dysphagia, oropharyngeal phase (R13.12) Attention and concentration deficit following -- Frontal lobe and executive function deficit following -- Impact on safety and function Mild aspiration risk;Moderate aspiration  risk   CHL IP TREATMENT RECOMMENDATION 02/16/2018 Treatment Recommendations Therapy as outlined in treatment plan below   Prognosis 02/16/2018 Prognosis for Safe Diet Advancement Good Barriers to Reach Goals Language deficits;Cognitive deficits Barriers/Prognosis Comment -- CHL IP DIET RECOMMENDATION 02/16/2018 SLP Diet Recommendations Dysphagia 1 (Puree) solids;Nectar thick liquid Liquid Administration via Cup Medication Administration Crushed with puree Compensations Slow rate;Small sips/bites;Minimize environmental distractions;Lingual sweep for clearance of pocketing Postural Changes --   CHL IP OTHER RECOMMENDATIONS 02/16/2018 Recommended Consults -- Oral Care Recommendations Oral care BID Other Recommendations Order thickener from pharmacy   CHL IP FOLLOW UP RECOMMENDATIONS 02/14/2018 Follow up Recommendations Other (comment)   CHL IP FREQUENCY AND DURATION 02/14/2018 Speech Therapy Frequency (ACUTE ONLY) min 2x/week Treatment Duration 2 weeks      CHL IP ORAL PHASE 02/16/2018 Oral Phase Impaired Oral - Pudding Teaspoon -- Oral - Pudding Cup -- Oral - Honey Teaspoon -- Oral - Honey Cup -- Oral - Nectar Teaspoon -- Oral - Nectar Cup Right anterior bolus loss;Weak lingual manipulation;Reduced posterior propulsion;Right pocketing in lateral sulci;Piecemeal swallowing;Delayed oral transit;Decreased bolus cohesion Oral - Nectar Straw -- Oral - Thin Teaspoon -- Oral - Thin Cup Right anterior bolus loss;Weak lingual manipulation;Reduced posterior propulsion;Right pocketing in lateral sulci;Piecemeal  swallowing;Delayed oral transit;Decreased bolus cohesion Oral - Thin Straw -- Oral - Puree Right anterior bolus loss;Weak lingual manipulation;Reduced posterior propulsion;Right pocketing in lateral sulci;Piecemeal swallowing;Delayed oral transit;Decreased bolus cohesion Oral - Mech Soft Right anterior bolus loss;Weak lingual manipulation;Reduced posterior propulsion;Right pocketing in lateral sulci;Piecemeal swallowing;Delayed oral transit;Decreased bolus cohesion Oral - Regular -- Oral - Multi-Consistency -- Oral - Pill -- Oral Phase - Comment --  CHL IP PHARYNGEAL PHASE 02/16/2018 Pharyngeal Phase Impaired Pharyngeal- Pudding Teaspoon -- Pharyngeal -- Pharyngeal- Pudding Cup -- Pharyngeal -- Pharyngeal- Honey Teaspoon -- Pharyngeal -- Pharyngeal- Honey Cup -- Pharyngeal -- Pharyngeal- Nectar Teaspoon -- Pharyngeal -- Pharyngeal- Nectar Cup Delayed swallow initiation-pyriform sinuses;Reduced pharyngeal peristalsis;Reduced tongue base retraction;Pharyngeal residue - valleculae Pharyngeal -- Pharyngeal- Nectar Straw -- Pharyngeal -- Pharyngeal- Thin Teaspoon -- Pharyngeal -- Pharyngeal- Thin Cup Delayed swallow initiation-pyriform sinuses;Reduced pharyngeal peristalsis;Reduced tongue base retraction;Penetration/Aspiration before swallow;Trace aspiration Pharyngeal Material enters airway, passes BELOW cords without attempt by patient to eject out (silent aspiration) Pharyngeal- Thin Straw -- Pharyngeal -- Pharyngeal- Puree Reduced pharyngeal peristalsis;Reduced tongue base retraction;Pharyngeal residue - valleculae;Delayed swallow initiation-vallecula Pharyngeal -- Pharyngeal- Mechanical Soft Reduced pharyngeal peristalsis;Reduced tongue base retraction;Pharyngeal residue - valleculae;Delayed swallow initiation-vallecula Pharyngeal -- Pharyngeal- Regular -- Pharyngeal -- Pharyngeal- Multi-consistency -- Pharyngeal -- Pharyngeal- Pill -- Pharyngeal -- Pharyngeal Comment --  No flowsheet data found. No flowsheet data  found. Blenda Mounts Laurice 02/16/2018, 4:36 PM              Ir Angio Intra Extracran Sel Com Carotid Innominate Bilat Mod Sed  Result Date: 02/16/2018 CLINICAL DATA:  Left cerebral hemispheric watershed strokes with right-sided weakness, and expressive aphasia. EXAM: IR ANGIO VERTEBRAL SEL VERTEBRAL UNI LEFT MOD SED; BILATERAL COMMON CAROTID AND INNOMINATE ANGIOGRAPHY; IR ANGIO VERTEBRAL SEL SUBCLAVIAN INNOMINATE UNI RIGHT MOD SED COMPARISON:  CT angiogram the head and neck of 02/12/2018. MEDICATIONS: Heparin 1000 units IV; no antibiotic was administered within 1 hour of the procedure. ANESTHESIA/SEDATION: Versed 1 mg IV; Fentanyl 25 mcg IV.  Hydralazine 20 mg. Moderate Sedation Time:  30 minutes. The patient was continuously monitored during the procedure by the interventional radiology nurse under my direct supervision. CONTRAST:  Isovue 300 approximately 60 mL. FLUOROSCOPY TIME:  Fluoroscopy Time: 10 minutes 18 seconds (793 mGy). COMPLICATIONS:  None immediate. TECHNIQUE: Informed written consent was obtained from the patient's niece after a thorough discussion of the procedural risks, benefits and alternatives. All questions were addressed. Maximal Sterile Barrier Technique was utilized including caps, mask, sterile gowns, sterile gloves, sterile drape, hand hygiene and skin antiseptic. A timeout was performed prior to the initiation of the procedure. During the procedure, an interpreter was present to assist with communication. The right groin was prepped and draped in the usual sterile fashion. Thereafter using modified Seldinger technique, transfemoral access into the right common femoral artery was obtained without difficulty. Over a 0.035 inch guidewire, a 5 French Pinnacle sheath was inserted. Through this, and also over 0.035 inch guidewire, a 5 [place] JB 1 catheter was advanced to the aortic arch region and selectively positioned in the right common carotid artery, the right subclavian artery, the  left common carotid artery and the left vertebral artery. FINDINGS: The left common carotid arteriogram demonstrates the left external carotid artery and its major branches to be widely patent. The left internal carotid artery just distal to the bulb has a severe pre occlusive 95% plus stenosis secondary to circumferential plaque. Distal to this there is mild poststenotic dilatation. More distally, there slow ascent of contrast to the cranial skull base in the left internal carotid artery which appears normal in caliber. The petrous, cavernous and the supraclinoid left ICA are widely patent. The left middle cerebral artery and the left anterior cerebral artery opacify into the capillary and venous phases. The right common carotid arteriogram demonstrates the origin of the right external carotid artery to be widely patent. There a mild stenosis of the proximal right external carotid artery. Its branches are seen to opacify normally. The right internal carotid artery at the bulb demonstrates an approximately 35-40% stenosis by the NASCET criteria associated with an approximately 5 mm well-defined ulcerated plaque with a resulting pseudoaneurysm measuring approximately 5 mm. No evidence of intraluminal filling defects are seen. The right internal carotid artery distal to this is widely patent. The petrous, cavernous and supraclinoid segments demonstrate wide patency. A right posterior communicating artery is seen opacifying the right posterior cerebral artery distribution. The right middle cerebral artery and the right anterior cerebral artery opacify into the capillary and venous phases. There is mild to moderate stenosis of the proximal inferior division of the right middle cerebral artery, and of the superior division probably representative of intracranial arterial sclerotic plaque. 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 The origin  of the right vertebral artery is widely patent. This vessel is seen to opacify to the cranial skull base. There is normal opacification of the hypoplastic right vertebrobasilar junction and the right posterior-inferior cerebellar artery. More distally, there is opacification of the distal right vertebrobasilar junction, with opacification of the basilar artery and the left posterior cerebral artery and transiently the superior cerebellar arteries. The dominant left vertebral artery has approximately 35-40% stenosis just distal to its origin. The vessel is, otherwise, seen to opacify normally to the cranial skull base. Normal opacification is seen in the left vertebrobasilar junction and the left posterior-inferior cerebellar artery. The basilar artery, the left posterior cerebral artery, the superior cerebellar arteries and the anterior-inferior cerebellar arteries are seen to opacify normally into the capillary and venous phases. Delayed arterial phase demonstrates retrograde opacification of the left posterior parietal cortical and subcortical regions from the leptomeningeal branches arising from the P3 segment of the  left posterior cerebral artery. 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 Approximately 35-40% stenosis of the right internal carotid artery just distal to the bulb associated with a 5 mm smooth ulceration/pseudoaneurysm. 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 PLAN: Will discuss findings with patient's neurologist. Electronically Signed   By: Julieanne Cotton M.D.   On: 02/15/2018 12:21   Ir Angio Vertebral Sel Subclavian Innominate Uni R Mod Sed  Result Date: 02/16/2018 CLINICAL DATA:  Left cerebral hemispheric watershed strokes with right-sided weakness, and expressive  aphasia. EXAM: IR ANGIO VERTEBRAL SEL VERTEBRAL UNI LEFT MOD SED; BILATERAL COMMON CAROTID AND INNOMINATE ANGIOGRAPHY; IR ANGIO VERTEBRAL SEL SUBCLAVIAN INNOMINATE UNI RIGHT MOD SED COMPARISON:  CT angiogram the head and neck of 02/12/2018. MEDICATIONS: Heparin 1000 units IV; no antibiotic was administered within 1 hour of the procedure. ANESTHESIA/SEDATION: Versed 1 mg IV; Fentanyl 25 mcg IV.  Hydralazine 20 mg. Moderate Sedation Time:  30 minutes. The patient was continuously monitored during the procedure by the interventional radiology nurse under my direct supervision. CONTRAST:  Isovue 300 approximately 60 mL. FLUOROSCOPY TIME:  Fluoroscopy Time: 10 minutes 18 seconds (793 mGy). COMPLICATIONS: None immediate. TECHNIQUE: Informed written consent was obtained from the patient's niece after a thorough discussion of the procedural risks, benefits and alternatives. All questions were addressed. Maximal Sterile Barrier Technique was utilized including caps, mask, sterile gowns, sterile gloves, sterile drape, hand hygiene and skin antiseptic. A timeout was performed prior to the initiation of the procedure. During the procedure, an interpreter was present to assist with communication. The right groin was prepped and draped in the usual sterile fashion. Thereafter using modified Seldinger technique, transfemoral access into the right common femoral artery was obtained without difficulty. Over a 0.035 inch guidewire, a 5 French Pinnacle sheath was inserted. Through this, and also over 0.035 inch guidewire, a 5 [place] JB 1 catheter was advanced to the aortic arch region and selectively positioned in the right common carotid artery, the right subclavian artery, the left common carotid artery and the left vertebral artery. FINDINGS: The left common carotid arteriogram demonstrates the left external carotid artery and its major branches to be widely patent. The left internal carotid artery just distal to the bulb has a  severe pre occlusive 95% plus stenosis secondary to circumferential plaque. Distal to this there is mild poststenotic dilatation. More distally, there slow ascent of contrast to the cranial skull base in the left internal carotid artery which appears normal in caliber. The petrous, cavernous and the supraclinoid left ICA are widely patent. The left middle cerebral artery and the left anterior cerebral artery opacify into the capillary and venous phases. The right common carotid arteriogram demonstrates the origin of the right external carotid artery to be widely patent. There a mild stenosis of the proximal right external carotid artery. Its branches are seen to opacify normally. The right internal carotid artery at the bulb demonstrates an approximately 35-40% stenosis by the NASCET criteria associated with an approximately 5 mm well-defined ulcerated plaque with a resulting pseudoaneurysm measuring approximately 5 mm. No evidence of intraluminal filling defects are seen. The right internal carotid artery distal to this is widely patent. The petrous, cavernous and supraclinoid segments demonstrate wide patency. A right posterior communicating artery is seen opacifying the right posterior cerebral artery  distribution. The right middle cerebral artery and the right anterior cerebral artery opacify into the capillary and venous phases. There is mild to moderate stenosis of the proximal inferior division of the right middle cerebral artery, and of the superior division probably representative of intracranial arterial sclerotic plaque. 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 The origin of the right vertebral artery is widely patent. This vessel is seen to opacify to the cranial skull base. There is normal opacification of the hypoplastic right vertebrobasilar junction and the right posterior-inferior cerebellar artery. More distally,  there is opacification of the distal right vertebrobasilar junction, with opacification of the basilar artery and the left posterior cerebral artery and transiently the superior cerebellar arteries. The dominant left vertebral artery has approximately 35-40% stenosis just distal to its origin. The vessel is, otherwise, seen to opacify normally to the cranial skull base. Normal opacification is seen in the left vertebrobasilar junction and the left posterior-inferior cerebellar artery. The basilar artery, the left posterior cerebral artery, the superior cerebellar arteries and the anterior-inferior cerebellar arteries are seen to opacify normally into the capillary and venous phases. Delayed arterial phase demonstrates retrograde opacification of the left posterior parietal cortical and subcortical regions from the leptomeningeal branches arising from the P3 segment of the left posterior cerebral artery. 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 Approximately 35-40% stenosis of the right internal carotid artery just distal to the bulb associated with a 5 mm smooth ulceration/pseudoaneurysm. 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 PLAN: Will discuss findings with patient's neurologist. Electronically Signed   By: Julieanne Cotton M.D.   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 EXAM: IR ANGIO VERTEBRAL SEL VERTEBRAL UNI LEFT MOD SED; BILATERAL COMMON CAROTID AND INNOMINATE ANGIOGRAPHY; IR ANGIO VERTEBRAL SEL SUBCLAVIAN INNOMINATE UNI RIGHT MOD SED COMPARISON:  CT angiogram the head and neck of 02/12/2018. MEDICATIONS: Heparin 1000  units IV; no antibiotic was administered within 1 hour of the procedure. ANESTHESIA/SEDATION: Versed 1 mg IV; Fentanyl 25 mcg IV.  Hydralazine 20 mg. Moderate Sedation Time:  30 minutes. The patient was continuously monitored during the procedure by the interventional radiology nurse under my direct supervision. CONTRAST:  Isovue 300 approximately 60 mL. FLUOROSCOPY TIME:  Fluoroscopy Time: 10 minutes 18 seconds (793 mGy). COMPLICATIONS: None immediate. TECHNIQUE: Informed written consent was obtained from the patient's niece after a thorough discussion of the procedural risks, benefits and alternatives. All questions were addressed. Maximal Sterile Barrier Technique was utilized including caps, mask, sterile gowns, sterile gloves, sterile drape, hand hygiene and skin antiseptic. A timeout was performed prior to the initiation of the procedure. During the procedure, an interpreter was present to assist with communication. The right groin was prepped and draped in the usual sterile fashion. Thereafter using modified Seldinger technique, transfemoral access into the right common femoral artery was obtained without difficulty. Over a 0.035 inch guidewire, a 5 French Pinnacle sheath was inserted. Through this, and also over 0.035 inch guidewire, a 5 [place] JB 1 catheter was advanced to the aortic arch region and selectively positioned in the right  common carotid artery, the right subclavian artery, the left common carotid artery and the left vertebral artery. FINDINGS: The left common carotid arteriogram demonstrates the left external carotid artery and its major branches to be widely patent. The left internal carotid artery just distal to the bulb has a severe pre occlusive 95% plus stenosis secondary to circumferential plaque. Distal to this there is mild poststenotic dilatation. More distally, there slow ascent of contrast to the cranial skull base in the left internal carotid artery which appears normal in caliber.  The petrous, cavernous and the supraclinoid left ICA are widely patent. The left middle cerebral artery and the left anterior cerebral artery opacify into the capillary and venous phases. The right common carotid arteriogram demonstrates the origin of the right external carotid artery to be widely patent. There a mild stenosis of the proximal right external carotid artery. Its branches are seen to opacify normally. The right internal carotid artery at the bulb demonstrates an approximately 35-40% stenosis by the NASCET criteria associated with an approximately 5 mm well-defined ulcerated plaque with a resulting pseudoaneurysm measuring approximately 5 mm. No evidence of intraluminal filling defects are seen. The right internal carotid artery distal to this is widely patent. The petrous, cavernous and supraclinoid segments demonstrate wide patency. A right posterior communicating artery is seen opacifying the right posterior cerebral artery distribution. The right middle cerebral artery and the right anterior cerebral artery opacify into the capillary and venous phases. There is mild to moderate stenosis of the proximal inferior division of the right middle cerebral artery, and of the superior division probably representative of intracranial arterial sclerotic plaque. 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 The origin of the right vertebral artery is widely patent. This vessel is seen to opacify to the cranial skull base. There is normal opacification of the hypoplastic right vertebrobasilar junction and the right posterior-inferior cerebellar artery. More distally, there is opacification of the distal right vertebrobasilar junction, with opacification of the basilar artery and the left posterior cerebral artery and transiently the superior cerebellar arteries. The dominant left vertebral artery has approximately 35-40% stenosis just  distal to its origin. The vessel is, otherwise, seen to opacify normally to the cranial skull base. Normal opacification is seen in the left vertebrobasilar junction and the left posterior-inferior cerebellar artery. The basilar artery, the left posterior cerebral artery, the superior cerebellar arteries and the anterior-inferior cerebellar arteries are seen to opacify normally into the capillary and venous phases. Delayed arterial phase demonstrates retrograde opacification of the left posterior parietal cortical and subcortical regions from the leptomeningeal branches arising from the P3 segment of the left posterior cerebral artery. 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 Approximately 35-40% stenosis of the right internal carotid artery just distal to the bulb associated with a 5 mm smooth ulceration/pseudoaneurysm. 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 PLAN: Will discuss findings with patient's neurologist. Electronically Signed   By: Julieanne CottonSanjeev  Deveshwar M.D.   On: 02/15/2018 12:21    Labs:  CBC: Recent Labs    02/13/18 0058 02/13/18 0102 02/13/18 0855 02/14/18 0842 02/16/18 0624  WBC 6.6  --  6.2 6.9 9.4  HGB 12.9* 13.3 13.6 13.2 13.1  HCT 39.6 39.0 41.4  40.4 39.8  PLT 221  --  247 247 250    COAGS: Recent Labs    02/11/18 1837  INR 1.02  APTT 34    BMP: Recent Labs    02/13/18 0058 02/13/18 0102 02/13/18 0855 02/14/18 0842 02/15/18 0636 02/16/18 0624  NA 138 142  --  140 139 139  K 3.3* 3.4*  --  4.1 4.2 3.8  CL 107 104  --  111 112* 106  CO2 25  --   --  23 23 23   GLUCOSE 112* 109*  --  94 95 102*  BUN 17 20  --  9 7 7   CALCIUM 8.3*  --   --  8.5* 8.5* 8.8*  CREATININE 1.55* 1.50* 1.07 1.10 1.07 1.06  GFRNONAA 42*  --  >60 >60 >60 >60    GFRAA 49*  --  >60 >60 >60 >60    LIVER FUNCTION TESTS: Recent Labs    02/11/18 1837 02/13/18 0058 02/14/18 0842  BILITOT 0.4 0.4 0.6  AST 21 18 16   ALT 17 15* 14*  ALKPHOS 74 58 59  PROT 7.8 6.8 6.7  ALBUMIN 3.7 3.1* 3.2*    TUMOR MARKERS: No results for input(s): AFPTM, CEA, CA199, CHROMGRNA in the last 8760 hours.  Assessment and Plan:  Proximal left ICA stenosis. Reviewed imaging with granddaughter. Discussed case with granddaughter. She states that patient was fully independent before CVA 01/2018. Patient is also planned for potential inpatient rehab. Explained that the best course of management at this time would be to observe patient at inpatient rehab for approximately 1 week. This is so we can see if he recovers from his comorbidities from his CVA. If patient is improving from rehab, plan to intervene on proximal left ICA stenosis in approximately 7-10 days.  Plan for follow-up in 1-2 weeks with image-guided cerebral angiogram with possible proximal left ICA revascularization. This will be pending patient's recovery from rehab. Instructed granddaughter that patient is to continue taking Plavix 75 mg once daily and Aspirin 81 mg once daily.  All questions answered and concerns addressed. Patient's granddaughter conveys understanding and agrees with plan.  Thank you for this interesting consult.  I greatly enjoyed meeting [facility] and look forward to participating in their care.  A copy of this report was sent to the requesting provider on this date.  Electronically Signed: Elwin Mocha, PA-C 02/18/2018, 8:50 AM   I spent a total of 20 Minutes in face to face in clinical consultation, greater than 50% of which was counseling/coordinating care for proximal left ICA stenosis.

## 2018-02-18 NOTE — Progress Notes (Addendum)
Report called to [facility], RN to resume care of patient.  Clarification, Chayla Shands- LPN.  Marylu Lund, LPN e

## 2018-02-18 NOTE — Care Management Note (Signed)
Case Management Note  Patient Details  Name: Jorge Burns MRN: [id number] Date of Birth: 03/16/1941  Subjective/Objective:    Pt admitted with CVA. He is from home with his nephew. Pt doesn't speak English.                 Action/Plan: Pt discharging to CIR today. CM signing off.   Expected Discharge Date:  02/18/18               Expected Discharge Plan:  IP Rehab Facility  In-House Referral:     Discharge planning Services  CM Consult  Post Acute Care Choice:    Choice offered to:     DME Arranged:    DME Agency:     HH Arranged:    HH Agency:     Status of Service:  Completed, signed off  If discussed at [employer], dates discussed:    Additional Comments:  Kermit Balo, RN 02/18/2018, 11:28 AM

## 2018-02-18 NOTE — H&P (Signed)
Physical Medicine and Rehabilitation Admission H&P       Chief Complaint  Patient presents with  . 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  Per chart review lives with reported niece and assistance as needed.  One level home 3 steps to entry.  Independent prior to admission and still working.  Presented 02/13/2018 with headache, right-sided weakness and slurred speech.  Cranial CT scan reviewed unremarkable for acute intracranial process.  Per report, old right parietal infarct.  Patient did not receive TPA.  MRI showed multifocal acute ischemia within the left hemisphere, predominantly within the deep watershed zone and left ACA/MCA.  Small subacute infarct within the right side white matter adjacent to the corpus callosum.  MRA of the head with no intracranial occlusion or high-grade stenosis.  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  CTA of head and neck showed 50% right ICA with a 3 to 4 mm posterior projecting ulceration.  Critical stenosis left ICA.  Interventional radiology consulted for cerebral angiogram that showed 95% plus LT ICA proximal stenosis with slow antegrade flow.  Approximately 35% stenosis of right ICA proximal with a 5 mm ulcerated plaque.  Echocardiogram with ejection fraction of 03% grade 1 diastolic dysfunction.  EEG was negative.  Carotid Doppler studies 02/17/2018 showing left ICA stenosis 80 - 99%.  Interventional radiology follow-up plan 1 to 2 weeks for proximal left ICA revascularization.  Presently on aspirin and Plavix for CVA prophylaxis.  Subcutaneous heparin for DVT prophylaxis.  Dysphasia #1 nectar thick liquid diet.  Physical and occupational therapy evaluations completed with recommendations of physical medicine rehab consult.  Review of Systems  Unable to perform ROS: Language       Past Medical History:  Diagnosis  Date  . Stroke [facility])    "a long time ago -- 2 years ago"        Past Surgical History:  Procedure Laterality Date  . IR ANGIO INTRA EXTRACRAN SEL COM CAROTID INNOMINATE BILAT MOD SED  02/15/2018  . IR ANGIO VERTEBRAL SEL SUBCLAVIAN INNOMINATE UNI R MOD SED  02/15/2018  . IR ANGIO VERTEBRAL SEL VERTEBRAL UNI L MOD SED  02/15/2018   No family history on file. Social History:  reports that he has never smoked. He has never used smokeless tobacco. He reports that he does not drink alcohol or use drugs. Allergies: No Known Allergies       Medications Prior to Admission  Medication Sig Dispense Refill  . butalbital-acetaminophen-caffeine (FIORICET, ESGIC) 50-325-40 MG tablet Take 1-2 tablets by mouth every 8 (eight) hours as needed for headache. 15 tablet 0    Drug Regimen Review Drug regimen was reviewed and remains appropriate with no significant issues identified  Home: Home Living Family/patient expects to be discharged to:: Private residence Living Arrangements: Other (Comment)(neice stays home with small children) Available Help at Discharge: Family, Available 24 hours/day Type of Home: House Home Access: Stairs to enter [employer] of Steps: 3 Home Layout: One level Bathroom Shower/Tub: Multimedia programmer: Standard Home Equipment: None  Lives With: Friend(s)   Functional History: Prior Function Level of Independence: Independent 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  Functional Status:  Mobility: Bed Mobility Overal bed mobility: Needs Assistance Bed Mobility: Supine to Sit Supine to sit: Min assist, HOB elevated Transfers Overall transfer level: Needs assistance Equipment used: 2 person  hand held assist Transfers: Sit to/from Stand Sit to Stand: +2 physical assistance, Min assist General transfer comment: assist for safety and R side awarness wtih bilateral HHA Ambulation/Gait Ambulation/Gait assistance: Min assist,  +2 physical assistance Ambulation Distance (Feet): 40 Feet Assistive device: 2 person hand held assist Gait Pattern/deviations: Step-through pattern, Decreased stride length, Decreased weight shift to right, Drifts right/left General Gait Details: leads with L side of his body needs assist for heavy R arm and for balance (falls to R and back without UE support)  ADL: ADL Overall ADL's : Needs assistance/impaired Eating/Feeding: NPO Grooming: Moderate assistance, Sitting Upper Body Bathing: Moderate assistance, Sitting Lower Body Bathing: Maximal assistance Lower Body Bathing Details (indicate cue type and reason): min A +2 sit<>stand Upper Body Dressing : Maximal assistance, Sitting Lower Body Dressing: Total assistance Lower Body Dressing Details (indicate cue type and reason): min A +2 sit<>stand Toilet Transfer: Minimal assistance, +2 for physical assistance, Ambulation Toilet Transfer Details (indicate cue type and reason): Bil HHA Toileting- Clothing Manipulation and Hygiene: Total assistance Toileting - Clothing Manipulation Details (indicate cue type and reason): min A +2 sit<>stand  Cognition: Cognition Overall Cognitive Status: Impaired/Different from baseline(difficult to assess secondary to language deficits) Arousal/Alertness: Awake/alert Orientation Level: Oriented to person Attention: Focused Focused Attention: Appears intact Cognition Arousal/Alertness: Awake/alert Behavior During Therapy: Flat affect Overall Cognitive Status: Impaired/Different from baseline(difficult to assess secondary to language deficits) Area of Impairment: Following commands, Safety/judgement, Problem solving Following Commands: Follows one step commands inconsistently, Follows one step commands with increased time(increased time for all commands and difficulty following even with gestures and neice and son interpreting) Safety/Judgement: Decreased awareness of safety, Decreased awareness of  deficits Problem Solving: Slow processing, Decreased initiation, Difficulty sequencing, Requires verbal cues, Requires tactile cues  Physical Exam: Blood pressure 138/78, pulse 62, temperature 97.7 F (36.5 C), temperature source Oral, resp. rate 18, height _0  (1.676 m), weight 57.2 kg (126 lb 1.7 oz), SpO2 93 %. Physical Exam  Constitutional: He appears well-developed.  HENT:  Head: Normocephalic.  Eyes: Pupils are equal, round, and reactive to light. Conjunctivae are normal.  Neck: Normal range of motion. Thyromegaly present.  Cardiovascular: Normal rate and regular rhythm. Exam reveals no friction rub.  No murmur heard. Respiratory: Effort normal. No respiratory distress. He has no wheezes.  GI: Soft. He exhibits no distension. There is no tenderness. There is no rebound.  Musculoskeletal: Normal range of motion.  Neurological:  Non-English-speaking [place] male.  ?aphasia vs culture language barrier. Dense right hemiparesis upper and lower ext (0/5). Moves left upper and lower without hesitation. Senses pain stim RUE and RLE. DTR's 1+. No resting tone. Right central 7 and tongue deviation  Psychiatric:  Flat but does attempt to engage    LabResultsLast48Hours        Results for orders placed or performed during the hospital encounter of 02/12/18 (from the past 48 hour(s))  CBC     Status: Abnormal   Collection Time: 02/16/18  6:24 AM  Result Value Ref Range   WBC 9.4 4.0 - 10.5 K/uL   RBC 5.36 4.22 - 5.81 MIL/uL   Hemoglobin 13.1 13.0 - 17.0 g/dL   HCT 39.8 39.0 - 52.0 %   MCV 74.3 (L) 78.0 - 100.0 fL   MCH 24.4 (L) 26.0 - 34.0 pg   MCHC 32.9 30.0 - 36.0 g/dL   RDW 15.6 (H) 11.5 - 15.5 %   Platelets 250 150 - 400 K/uL    Comment: Performed at [facility]  [facility], [place] [address], [place], [place] [address]  Basic metabolic panel     Status: Abnormal   Collection Time: 02/16/18  6:24 AM  Result Value Ref Range   Sodium 139 135 - 145 mmol/L    Potassium 3.8 3.5 - 5.1 mmol/L   Chloride 106 101 - 111 mmol/L   CO2 23 22 - 32 mmol/L   Glucose, Bld 102 (H) 65 - 99 mg/dL   BUN 7 6 - 20 mg/dL   Creatinine, Ser 1.06 0.61 - 1.24 mg/dL   Calcium 8.8 (L) 8.9 - 10.3 mg/dL   GFR calc non Af Amer >60 >60 mL/min   GFR calc Af Amer >60 >60 mL/min    Comment: (NOTE) The eGFR has been calculated using the CKD EPI equation. This calculation has not been validated in all clinical situations. eGFR's persistently <60 mL/min signify possible Chronic Kidney Disease.    Anion gap 10 5 - 15    Comment: Performed at [facility] [address] [place], [place] [address]      ImagingResults(Last48hours)  Ct Head Wo Contrast  Result Date: 02/16/2018 CLINICAL DATA:  77 year old male with patchy acute left hemisphere infarcts found to have critical left ICA stenosis. EXAM: CT HEAD WITHOUT CONTRAST TECHNIQUE: Contiguous axial images were obtained from the base of the skull through the vertex without intravenous contrast. COMPARISON:  Diagnostic cerebral angiogram 02/15/2018. Brain MRI and CTA head and neck 02/13/2018. Head CT 02/12/2018. FINDINGS: Brain: Progressed patchy hypodensity in the left MCA and left MCA/ACA watershed territory corresponding to areas of restricted diffusion on 02/13/2018. No associated hemorrhage or mass effect. Superimposed chronic posterior right temporal lobe and right parietal lobe encephalomalacia. No new cortically based infarct identified. Stable ventricle size and configuration. No intracranial mass effect Vascular: Calcified atherosclerosis at the skull base. No suspicious intracranial vascular hyperdensity. Skull: No acute osseous abnormality identified. Sinuses/Orbits: Stable paranasal sinuses. Other: Stable orbit and scalp soft tissues. IMPRESSION: 1. Expected evolution of the Left MCA and MCA/ACA watershed territory infarcts seen on 02/13/2018 with no associated hemorrhage or mass effect. 2. Chronic  right MCA infarcts. 3. No new intracranial abnormality. Electronically Signed   By: Genevie Ann M.D.   On: 02/16/2018 15:04   Ct Chest Wo Contrast  Result Date: 02/16/2018 CLINICAL DATA:  Pneumonia.  Nonproductive cough. EXAM: CT CHEST WITHOUT CONTRAST TECHNIQUE: Multidetector CT imaging of the chest was performed following the standard protocol without IV contrast. COMPARISON:  None. FINDINGS: Cardiovascular: Atherosclerotic calcifications of the ascending aorta, aortic arch, and descending thoracic aorta are noted. There is no evidence of acute intramural hematoma within the aorta. Maximal diameter of the ascending aorta is 3.4 cm. Minimal coronary artery calcification. Mild aortic valve calcifications. Mediastinum/Nodes: No pericardial effusion. No abnormal mediastinal adenopathy. Large densely calcified nodule in the left lobe of the thyroid gland is noted. Esophagus is within normal limits. Lungs/Pleura: No pneumothorax. No pleural effusion. There are dense calcifications along the pleura of the posterior left lower hemithorax associated with mild pleural thickening. There is minimal pleural thickening and calcification in the posterior right hemithorax. There is a 1.6 x 3.4 cm cavitary lesion at the left lung apex on image 30 containing a lobulated 1.6 cm round density. This may represent a fungus ball within a cavitary lung lesion or possibly a 1.6 cm nodule within a cavitary mass. There are associated linear opacities. There are a number of satellite nodules in the left upper lobe. 8 mm nodule in the  left upper lobe on image 33. 5 mm nodule in the left upper lobe on image 42. There is also a small spiculated nodule in the right upper lobe measuring 7 mm on image 34. Scattered linear scarring in the lungs. There are linear opacities and reticulonodular opacities towards the lung bases left greater than right. There is some bronchiolectasis. There is some mucoid filling of left lower lobe airways. Upper  Abdomen: No acute abnormality. Musculoskeletal: No vertebral compression deformity. IMPRESSION: There is a large cavitary lung lesion at the left apex containing a 1.6 cm nodular density. This finding is nonspecific. Differential diagnosis includes reactivation tuberculosis, a fungal ball within a cavitary lung lesion, or malignancy. Correlate clinically as for the need for further imaging and sputum analysis. PET-CT may be helpful. There are several small satellite nodules in the left upper lobe. There is a 7 mm spiculated nodule in the right upper lobe. Malignancy is not excluded. Reticulonodular opacities at the left base may represent an inflammatory process. There is associated mucoid filling of small airways at the left base and bronchiolectasis. Large densely calcified nodule in the left lobe of the thyroid gland. Aortic Atherosclerosis (ICD10-I70.0). Electronically Signed   By: Marybelle Killings M.D.   On: 02/16/2018 14:45   Dg Swallowing Func-speech Pathology  Result Date: 02/16/2018 Objective Swallowing Evaluation: Type of Study: MBS-Modified Barium Swallow Study  Patient Details Name: Jaymz Traywick MRN: [id number] Date of Birth: 08-03-1941 Today's Date: 02/16/2018 Time: SLP Start Time (ACUTE ONLY): 1340 -SLP Stop Time (ACUTE ONLY): 1400 SLP Time Calculation (min) (ACUTE ONLY): 20 min Past Medical History: Past Medical History: Diagnosis Date . Stroke [facility])   "a long time ago -- 2 years ago" Past Surgical History: Past Surgical History: Procedure Laterality Date . IR ANGIO INTRA EXTRACRAN SEL COM CAROTID INNOMINATE BILAT MOD SED  02/15/2018 . IR ANGIO VERTEBRAL SEL SUBCLAVIAN INNOMINATE UNI R MOD SED  02/15/2018 . IR ANGIO VERTEBRAL SEL VERTEBRAL UNI L MOD SED  02/15/2018 HPI: Jorge Burns is a 77 y.o. male with history of hypertension and a previous stroke presenting with right sided weakness and AMS. He did not receive IV t-PA due to late presentation. MRI showed multifocal acute ischemia within the left  hemisphere due to severe left carotid stenosis. Pt initially passed stroke swallow screen, referred for swallow evaluation due to MD, RN concerns; MD downgraded to D1/nectar.  Repeat MRI 5/29 Expected evolution of the Left MCA and MCA/ACA watershed territory infarcts seen on 02/13/2018 with no associated hemorrhage or mass effect.  Subjective: Alert; interpretor present for study Assessment / Plan / Recommendation CHL IP CLINICAL IMPRESSIONS 02/16/2018 Clinical Impression Pt presents with a moderate sensorimotor dysphagia marked by decreased bolus control/cohesion with anterior loss and right buccal residue post-swallow; decreased pharyngeal squeeze and reduced tongue base contact with pharyngeal walls, leading to vallecular residue post-swallow; silent aspiration of thin liquids with material entering posterior larynx/trachea before onset of pharyngeal swallow.  Due to aphasia, despite interpreter present to assist with communication, pt unable to follow commands for postural adjustments/swallow modifications.  Recommend continuing downgrading diet to dysphagia 1 for now; continue nectar thick liquids; give meds crushed in puree due to residue.  SLP will follow for safety/education and diet progression.  Interpreter relayed results of study to pt - aphasia compromises pt's comprehension.  SLP Visit Diagnosis Dysphagia, oropharyngeal phase (R13.12) Attention and concentration deficit following -- Frontal lobe and executive function deficit following -- Impact on safety and function Mild aspiration risk;Moderate aspiration risk  CHL IP TREATMENT RECOMMENDATION 02/16/2018 Treatment Recommendations Therapy as outlined in treatment plan below   Prognosis 02/16/2018 Prognosis for Safe Diet Advancement Good Barriers to Reach Goals Language deficits;Cognitive deficits Barriers/Prognosis Comment -- CHL IP DIET RECOMMENDATION 02/16/2018 SLP Diet Recommendations Dysphagia 1 (Puree) solids;Nectar thick liquid Liquid  Administration via Cup Medication Administration Crushed with puree Compensations Slow rate;Small sips/bites;Minimize environmental distractions;Lingual sweep for clearance of pocketing Postural Changes --   CHL IP OTHER RECOMMENDATIONS 02/16/2018 Recommended Consults -- Oral Care Recommendations Oral care BID Other Recommendations Order thickener from pharmacy   CHL IP FOLLOW UP RECOMMENDATIONS 02/14/2018 Follow up Recommendations Other (comment)   CHL IP FREQUENCY AND DURATION 02/14/2018 Speech Therapy Frequency (ACUTE ONLY) min 2x/week Treatment Duration 2 weeks      CHL IP ORAL PHASE 02/16/2018 Oral Phase Impaired Oral - Pudding Teaspoon -- Oral - Pudding Cup -- Oral - Honey Teaspoon -- Oral - Honey Cup -- Oral - Nectar Teaspoon -- Oral - Nectar Cup Right anterior bolus loss;Weak lingual manipulation;Reduced posterior propulsion;Right pocketing in lateral sulci;Piecemeal swallowing;Delayed oral transit;Decreased bolus cohesion Oral - Nectar Straw -- Oral - Thin Teaspoon -- Oral - Thin Cup Right anterior bolus loss;Weak lingual manipulation;Reduced posterior propulsion;Right pocketing in lateral sulci;Piecemeal swallowing;Delayed oral transit;Decreased bolus cohesion Oral - Thin Straw -- Oral - Puree Right anterior bolus loss;Weak lingual manipulation;Reduced posterior propulsion;Right pocketing in lateral sulci;Piecemeal swallowing;Delayed oral transit;Decreased bolus cohesion Oral - Mech Soft Right anterior bolus loss;Weak lingual manipulation;Reduced posterior propulsion;Right pocketing in lateral sulci;Piecemeal swallowing;Delayed oral transit;Decreased bolus cohesion Oral - Regular -- Oral - Multi-Consistency -- Oral - Pill -- Oral Phase - Comment --  CHL IP PHARYNGEAL PHASE 02/16/2018 Pharyngeal Phase Impaired Pharyngeal- Pudding Teaspoon -- Pharyngeal -- Pharyngeal- Pudding Cup -- Pharyngeal -- Pharyngeal- Honey Teaspoon -- Pharyngeal -- Pharyngeal- Honey Cup -- Pharyngeal -- Pharyngeal- Nectar Teaspoon --  Pharyngeal -- Pharyngeal- Nectar Cup Delayed swallow initiation-pyriform sinuses;Reduced pharyngeal peristalsis;Reduced tongue base retraction;Pharyngeal residue - valleculae Pharyngeal -- Pharyngeal- Nectar Straw -- Pharyngeal -- Pharyngeal- Thin Teaspoon -- Pharyngeal -- Pharyngeal- Thin Cup Delayed swallow initiation-pyriform sinuses;Reduced pharyngeal peristalsis;Reduced tongue base retraction;Penetration/Aspiration before swallow;Trace aspiration Pharyngeal Material enters airway, passes BELOW cords without attempt by patient to eject out (silent aspiration) Pharyngeal- Thin Straw -- Pharyngeal -- Pharyngeal- Puree Reduced pharyngeal peristalsis;Reduced tongue base retraction;Pharyngeal residue - valleculae;Delayed swallow initiation-vallecula Pharyngeal -- Pharyngeal- Mechanical Soft Reduced pharyngeal peristalsis;Reduced tongue base retraction;Pharyngeal residue - valleculae;Delayed swallow initiation-vallecula Pharyngeal -- Pharyngeal- Regular -- Pharyngeal -- Pharyngeal- Multi-consistency -- Pharyngeal -- Pharyngeal- Pill -- Pharyngeal -- Pharyngeal Comment --  No flowsheet data found. No flowsheet data found. Jorge Burns 02/16/2018, 4:36 PM              Ir Angio Intra Extracran Sel Com Carotid Innominate Bilat Mod Sed  Result Date: 02/16/2018 CLINICAL DATA:  Left cerebral hemispheric watershed strokes with right-sided weakness, and expressive aphasia. EXAM: IR ANGIO VERTEBRAL SEL VERTEBRAL UNI LEFT MOD SED; BILATERAL COMMON CAROTID AND INNOMINATE ANGIOGRAPHY; IR ANGIO VERTEBRAL SEL SUBCLAVIAN INNOMINATE UNI RIGHT MOD SED COMPARISON:  CT angiogram the head and neck of 02/12/2018. MEDICATIONS: Heparin 1000 units IV; no antibiotic was administered within 1 hour of the procedure. ANESTHESIA/SEDATION: Versed 1 mg IV; Fentanyl 25 mcg IV.  Hydralazine 20 mg. Moderate Sedation Time:  30 minutes. The patient was continuously monitored during the procedure by the interventional radiology nurse under  my direct supervision. CONTRAST:  Isovue 300 approximately 60 mL. FLUOROSCOPY TIME:  Fluoroscopy Time: 10 minutes 18 seconds (793 mGy). COMPLICATIONS: None immediate.  TECHNIQUE: Informed written consent was obtained from the patient's niece after a thorough discussion of the procedural risks, benefits and alternatives. All questions were addressed. Maximal Sterile Barrier Technique was utilized including caps, mask, sterile gowns, sterile gloves, sterile drape, hand hygiene and skin antiseptic. A timeout was performed prior to the initiation of the procedure. During the procedure, an interpreter was present to assist with communication. The right groin was prepped and draped in the usual sterile fashion. Thereafter using modified Seldinger technique, transfemoral access into the right common femoral artery was obtained without difficulty. Over a 0.035 inch guidewire, a 5 French Pinnacle sheath was inserted. Through this, and also over 0.035 inch guidewire, a 5 [place] JB 1 catheter was advanced to the aortic arch region and selectively positioned in the right common carotid artery, the right subclavian artery, the left common carotid artery and the left vertebral artery. FINDINGS: The left common carotid arteriogram demonstrates the left external carotid artery and its major branches to be widely patent. The left internal carotid artery just distal to the bulb has a severe pre occlusive 95% plus stenosis secondary to circumferential plaque. Distal to this there is mild poststenotic dilatation. More distally, there slow ascent of contrast to the cranial skull base in the left internal carotid artery which appears normal in caliber. The petrous, cavernous and the supraclinoid left ICA are widely patent. The left middle cerebral artery and the left anterior cerebral artery opacify into the capillary and venous phases. The right common carotid arteriogram demonstrates the origin of the right external carotid artery to be  widely patent. There a mild stenosis of the proximal right external carotid artery. Its branches are seen to opacify normally. The right internal carotid artery at the bulb demonstrates an approximately 35-40% stenosis by the NASCET criteria associated with an approximately 5 mm well-defined ulcerated plaque with a resulting pseudoaneurysm measuring approximately 5 mm. No evidence of intraluminal filling defects are seen. The right internal carotid artery distal to this is widely patent. The petrous, cavernous and supraclinoid segments demonstrate wide patency. A right posterior communicating artery is seen opacifying the right posterior cerebral artery distribution. The right middle cerebral artery and the right anterior cerebral artery opacify into the capillary and venous phases. There is mild to moderate stenosis of the proximal inferior division of the right middle cerebral artery, and of the superior division probably representative of intracranial arterial sclerotic plaque. 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 The origin of the right vertebral artery is widely patent. This vessel is seen to opacify to the cranial skull base. There is normal opacification of the hypoplastic right vertebrobasilar junction and the right posterior-inferior cerebellar artery. More distally, there is opacification of the distal right vertebrobasilar junction, with opacification of the basilar artery and the left posterior cerebral artery and transiently the superior cerebellar arteries. The dominant left vertebral artery has approximately 35-40% stenosis just distal to its origin. The vessel is, otherwise, seen to opacify normally to the cranial skull base. Normal opacification is seen in the left vertebrobasilar junction and the left posterior-inferior cerebellar artery. The basilar artery, the left posterior cerebral artery, the superior  cerebellar arteries and the anterior-inferior cerebellar arteries are seen to opacify normally into the capillary and venous phases. Delayed arterial phase demonstrates retrograde opacification of the left posterior parietal cortical and subcortical regions from the leptomeningeal branches arising from the P3 segment of the left posterior  cerebral artery. 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 Approximately 35-40% stenosis of the right internal carotid artery just distal to the bulb associated with a 5 mm smooth ulceration/pseudoaneurysm. 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 PLAN: Will discuss findings with patient's neurologist. Electronically Signed   By: Luanne Bras M.D.   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 EXAM: IR ANGIO VERTEBRAL SEL VERTEBRAL UNI LEFT MOD SED; BILATERAL COMMON CAROTID AND INNOMINATE ANGIOGRAPHY; IR ANGIO VERTEBRAL SEL SUBCLAVIAN INNOMINATE UNI RIGHT MOD SED COMPARISON:  CT angiogram the head and neck of 02/12/2018. MEDICATIONS: Heparin 1000 units IV; no antibiotic was administered within 1 hour of the procedure. ANESTHESIA/SEDATION: Versed 1 mg IV; Fentanyl 25 mcg IV.  Hydralazine 20 mg. Moderate Sedation Time:  30 minutes. The patient was continuously monitored during the procedure by the interventional radiology nurse under my direct supervision. CONTRAST:  Isovue 300 approximately 60 mL. FLUOROSCOPY TIME:  Fluoroscopy Time: 10 minutes 18 seconds (793 mGy). COMPLICATIONS: None immediate. TECHNIQUE: Informed written consent was obtained from the patient's niece after a  thorough discussion of the procedural risks, benefits and alternatives. All questions were addressed. Maximal Sterile Barrier Technique was utilized including caps, mask, sterile gowns, sterile gloves, sterile drape, hand hygiene and skin antiseptic. A timeout was performed prior to the initiation of the procedure. During the procedure, an interpreter was present to assist with communication. The right groin was prepped and draped in the usual sterile fashion. Thereafter using modified Seldinger technique, transfemoral access into the right common femoral artery was obtained without difficulty. Over a 0.035 inch guidewire, a 5 French Pinnacle sheath was inserted. Through this, and also over 0.035 inch guidewire, a 5 [place] JB 1 catheter was advanced to the aortic arch region and selectively positioned in the right common carotid artery, the right subclavian artery, the left common carotid artery and the left vertebral artery. FINDINGS: The left common carotid arteriogram demonstrates the left external carotid artery and its major branches to be widely patent. The left internal carotid artery just distal to the bulb has a severe pre occlusive 95% plus stenosis secondary to circumferential plaque. Distal to this there is mild poststenotic dilatation. More distally, there slow ascent of contrast to the cranial skull base in the left internal carotid artery which appears normal in caliber. The petrous, cavernous and the supraclinoid left ICA are widely patent. The left middle cerebral artery and the left anterior cerebral artery opacify into the capillary and venous phases. The right common carotid arteriogram demonstrates the origin of the right external carotid artery to be widely patent. There a mild stenosis of the proximal right external carotid artery. Its branches are seen to opacify normally. The right internal carotid artery at the bulb demonstrates an approximately 35-40% stenosis by the NASCET criteria  associated with an approximately 5 mm well-defined ulcerated plaque with a resulting pseudoaneurysm measuring approximately 5 mm. No evidence of intraluminal filling defects are seen. The right internal carotid artery distal to this is widely patent. The petrous, cavernous and supraclinoid segments demonstrate wide patency. A right posterior communicating artery is seen opacifying the right posterior cerebral artery distribution. The  right middle cerebral artery and the right anterior cerebral artery opacify into the capillary and venous phases. There is mild to moderate stenosis of the proximal inferior division of the right middle cerebral artery, and of the superior division probably representative of intracranial arterial sclerotic plaque. 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 The origin of the right vertebral artery is widely patent. This vessel is seen to opacify to the cranial skull base. There is normal opacification of the hypoplastic right vertebrobasilar junction and the right posterior-inferior cerebellar artery. More distally, there is opacification of the distal right vertebrobasilar junction, with opacification of the basilar artery and the left posterior cerebral artery and transiently the superior cerebellar arteries. The dominant left vertebral artery has approximately 35-40% stenosis just distal to its origin. The vessel is, otherwise, seen to opacify normally to the cranial skull base. Normal opacification is seen in the left vertebrobasilar junction and the left posterior-inferior cerebellar artery. The basilar artery, the left posterior cerebral artery, the superior cerebellar arteries and the anterior-inferior cerebellar arteries are seen to opacify normally into the capillary and venous phases. Delayed arterial phase demonstrates retrograde opacification of the left posterior parietal cortical and subcortical  regions from the leptomeningeal branches arising from the P3 segment of the left posterior cerebral artery. 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 Approximately 35-40% stenosis of the right internal carotid artery just distal to the bulb associated with a 5 mm smooth ulceration/pseudoaneurysm. 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 PLAN: Will discuss findings with patient's neurologist. Electronically Signed   By: Luanne Bras M.D.   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 EXAM: IR ANGIO VERTEBRAL SEL VERTEBRAL UNI LEFT MOD SED; BILATERAL COMMON CAROTID AND INNOMINATE ANGIOGRAPHY; IR ANGIO VERTEBRAL SEL SUBCLAVIAN INNOMINATE UNI RIGHT MOD SED COMPARISON:  CT angiogram the head and neck of 02/12/2018. MEDICATIONS: Heparin 1000 units IV; no antibiotic was administered within 1 hour of the procedure. ANESTHESIA/SEDATION: Versed 1 mg IV; Fentanyl 25 mcg IV.  Hydralazine 20 mg. Moderate Sedation Time:  30 minutes. The patient was continuously monitored during the procedure by the interventional radiology nurse under my direct supervision. CONTRAST:  Isovue 300 approximately 60 mL. FLUOROSCOPY TIME:  Fluoroscopy Time: 10 minutes 18 seconds (793 mGy). COMPLICATIONS: None immediate. TECHNIQUE: Informed written consent was obtained from the patient's niece after a thorough discussion of the procedural risks, benefits and alternatives. All questions were addressed. Maximal Sterile Barrier Technique was utilized including caps, mask, sterile gowns, sterile gloves, sterile drape, hand hygiene and skin antiseptic. A timeout was  performed prior to the initiation of the procedure. During the procedure, an interpreter was present to assist with communication. The right groin was prepped and draped in the usual sterile fashion. Thereafter using modified Seldinger technique, transfemoral access into the right common femoral artery was obtained without difficulty. Over a 0.035 inch guidewire, a 5 French Pinnacle sheath was inserted. Through this, and also over 0.035 inch guidewire, a 5 [place] JB 1 catheter was advanced to the aortic arch region and selectively positioned in the right common carotid  artery, the right subclavian artery, the left common carotid artery and the left vertebral artery. FINDINGS: The left common carotid arteriogram demonstrates the left external carotid artery and its major branches to be widely patent. The left internal carotid artery just distal to the bulb has a severe pre occlusive 95% plus stenosis secondary to circumferential plaque. Distal to this there is mild poststenotic dilatation. More distally, there slow ascent of contrast to the cranial skull base in the left internal carotid artery which appears normal in caliber. The petrous, cavernous and the supraclinoid left ICA are widely patent. The left middle cerebral artery and the left anterior cerebral artery opacify into the capillary and venous phases. The right common carotid arteriogram demonstrates the origin of the right external carotid artery to be widely patent. There a mild stenosis of the proximal right external carotid artery. Its branches are seen to opacify normally. The right internal carotid artery at the bulb demonstrates an approximately 35-40% stenosis by the NASCET criteria associated with an approximately 5 mm well-defined ulcerated plaque with a resulting pseudoaneurysm measuring approximately 5 mm. No evidence of intraluminal filling defects are seen. The right internal carotid artery distal to this is widely patent. The petrous,  cavernous and supraclinoid segments demonstrate wide patency. A right posterior communicating artery is seen opacifying the right posterior cerebral artery distribution. The right middle cerebral artery and the right anterior cerebral artery opacify into the capillary and venous phases. There is mild to moderate stenosis of the proximal inferior division of the right middle cerebral artery, and of the superior division probably representative of intracranial arterial sclerotic plaque. 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 The origin of the right vertebral artery is widely patent. This vessel is seen to opacify to the cranial skull base. There is normal opacification of the hypoplastic right vertebrobasilar junction and the right posterior-inferior cerebellar artery. More distally, there is opacification of the distal right vertebrobasilar junction, with opacification of the basilar artery and the left posterior cerebral artery and transiently the superior cerebellar arteries. The dominant left vertebral artery has approximately 35-40% stenosis just distal to its origin. The vessel is, otherwise, seen to opacify normally to the cranial skull base. Normal opacification is seen in the left vertebrobasilar junction and the left posterior-inferior cerebellar artery. The basilar artery, the left posterior cerebral artery, the superior cerebellar arteries and the anterior-inferior cerebellar arteries are seen to opacify normally into the capillary and venous phases. Delayed arterial phase demonstrates retrograde opacification of the left posterior parietal cortical and subcortical regions from the leptomeningeal branches arising from the P3 segment of the left posterior cerebral artery. 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 Approximately 35-40% stenosis of the right internal carotid artery just distal to the bulb associated with a 5 mm smooth ulceration/pseudoaneurysm. 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 PLAN: Will discuss findings with patient's neurologist. Electronically Signed   By: Luanne Bras M.D.   On: 02/15/2018 12:21        Medical Problem List and Plan: 1.  Right-sided weakness and slurred speech secondary to multifocal acute ischemia within the left hemisphere, predominantly within the deep watershed zone and left ACA/MCA cortical watershed zone.  Small subacute infarct  within the right side white matter adjacent to the corpus callosum splenium             -admit to inpatient rehab             -translator needed for communication 2.  DVT Prophylaxis/Anticoagulation: Subcutaneous heparin 3. Pain Management: Tylenol as needed 4. Mood: Provide emotional support 5. Neuropsych: This patient is capable of making decisions on his own behalf. 6. Skin/Wound Care: Routine skin checks 7. Fluids/Electrolytes/Nutrition: Routine in and outs with follow-up chemistries             -encourage PO 8.  Permissive hypertension.  Monitor with increased mobility 9.  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  Procedure based on timeframe of rehab services 10.  Hyperlipidemia.  Lipitor  Post Admission Physician Evaluation: 1. 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 2. Patient is admitted to receive collaborative, interdisciplinary care between the physiatrist, rehab nursing staff, and therapy team. 3. Patient's level of medical complexity and substantial therapy needs in context of that medical necessity cannot be provided at a lesser intensity of care such as a SNF. 4. Patient has experienced substantial functional loss  from his/her baseline which was documented above under the "Functional History" and "Functional Status" headings.  Judging by the patient's diagnosis, physical exam, and functional history, the patient has potential for functional progress which will result in measurable gains while on inpatient rehab.  These gains will be of substantial and practical use upon discharge  in facilitating mobility and self-care at the household level. 5. Physiatrist will provide 24 hour management of medical needs as well as oversight of the therapy plan/treatment and provide guidance as appropriate regarding the interaction of the two. 6. The Preadmission Screening has been reviewed and patient status is unchanged unless otherwise stated above. 7. 24 hour rehab nursing will assist with bladder management, bowel management, safety, skin/wound care, disease management, medication administration, pain management and patient education  and help integrate therapy concepts, techniques,education, etc. 8. PT will assess and treat for/with: Lower extremity strength, range of motion, stamina, balance, functional mobility, safety, adaptive techniques and equipment, NMR, family education, ego support.   Goals are: min assist to supervision 9. OT will assess and treat for/with: ADL's, functional mobility, safety, upper extremity strength, adaptive techniques and equipment, NMR, family education, ego support.   Goals are: min assist to supervision. Therapy may proceed with showering this patient. 10. SLP will assess and treat for/with: cognition, communication, family education.  Goals are: supervision to min assist. 11. Case Management and Social Worker will assess and treat for psychological issues and discharge planning. 12. Team conference will be held weekly to assess progress toward goals and to determine barriers to discharge. 13. Patient will receive at least 3 hours of therapy per day at least 5 days per week. 14. ELOS: 10-14  days       15. Prognosis:  excellent    I have personally performed a face to face diagnostic evaluation of this patient and formulated the key components of the plan.  Additionally, I have personally reviewed laboratory data, imaging studies, as well as relevant notes and concur with the physician assistant's documentation above.  Meredith Staggers, MD, FAAPMR   Lavon Paganini [place], PA-C 02/17/2018

## 2018-02-18 NOTE — Progress Notes (Signed)
Marcello Fennel, MD  Physician  Physical Medicine and Rehabilitation  Consult Note  Signed  Date of Service:  02/15/2018 6:39 AM       Related encounter: ED to Hosp-Admission (Current) from 02/12/2018 in [place] [facility]W [facility]      Signed      Expand All Collapse All       Show:Clear all Manual[x] Template[] Copied  Added by: Angiulli, Mcarthur Rossetti, PA-C[x] Marcello Fennel, MD   Hover for details        Physical Medicine and Rehabilitation Consult Reason for Consult: Right side weakness and slurred speech Referring Physician: [facility]   HPI: Jorge Burns is a 77 y.o. 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  Per chart review patient lives with niece.  One level home 3 steps to entry.  Independent prior to admission and working.  Presented 02/13/2018 with headache, right-sided weakness and slurred speech.  Cranial CT reviewed, unremarkable for acute intracranial process. Per report, old right parietal infarct.  Patient did not receive TPA.  MRI showed multifocal acute ischemia within the left hemisphere, predominantly within the deep watershed zone and left ACA/MCA.  Small subacute infarct within the right side white matter adjacent to the corpus callosum.  MRA of the head with no intracranial occlusion or high-grade stenosis.  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  CTA of head and neck showed 50% right ICA with a 3 to 4 mm posterior projecting ulceration.  Critical stenosis left ICA.  Interventional radiology consulted for cerebral angiogram.  Echocardiogram with ejection fraction of 55% grade 1 diastolic dysfunction.  EEG was negative.  Presently on aspirin and Plavix for CVA prophylaxis.  Physical and occupational therapy evaluation completed with recommendations of physical medicine rehab consult.  Review of Systems  Unable to perform ROS: Language       Past  Medical History:  Diagnosis Date  . Stroke [facility])    "a long time ago -- 2 years ago"   No past surgical history on file., unable to obtain from patient No family history on file., unable to obtain from patient Social History:  reports that he has never smoked. He has never used smokeless tobacco. He reports that he does not drink alcohol or use drugs. Allergies: No Known Allergies       Medications Prior to Admission  Medication Sig Dispense Refill  . butalbital-acetaminophen-caffeine (FIORICET, ESGIC) 50-325-40 MG tablet Take 1-2 tablets by mouth every 8 (eight) hours as needed for headache. 15 tablet 0    Home: Home Living Family/patient expects to be discharged to:: Private residence Living Arrangements: Other (Comment)(neice stays home with small children) Available Help at Discharge: Family, Available 24 hours/day Type of Home: House Home Access: Stairs to enter [employer] of Steps: 3 Home Layout: One level Bathroom Shower/Tub: Health visitor: Standard Home Equipment: None  Functional History: Prior Function Level of Independence: Independent 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 Functional Status:  Mobility: Bed Mobility Overal bed mobility: Needs Assistance Bed Mobility: Supine to Sit Supine to sit: Min assist, HOB elevated Transfers Overall transfer level: Needs assistance Equipment used: 2 person hand held assist Transfers: Sit to/from Stand Sit to Stand: +2 physical assistance, Min assist General transfer comment: assist for safety and R side awarness wtih bilateral HHA Ambulation/Gait Ambulation/Gait assistance: Min assist, +2 physical assistance Ambulation Distance (Feet): 40 Feet Assistive device: 2 person hand held assist Gait  Pattern/deviations: Step-through pattern, Decreased stride length, Decreased weight shift to right, Drifts right/left General Gait Details: leads with L side of his body needs assist for  heavy R arm and for balance (falls to R and back without UE support)  ADL: ADL Overall ADL's : Needs assistance/impaired Eating/Feeding: NPO Grooming: Moderate assistance, Sitting Upper Body Bathing: Moderate assistance, Sitting Lower Body Bathing: Maximal assistance Lower Body Bathing Details (indicate cue type and reason): min A +2 sit<>stand Upper Body Dressing : Maximal assistance, Sitting Lower Body Dressing: Total assistance Lower Body Dressing Details (indicate cue type and reason): min A +2 sit<>stand Toilet Transfer: Minimal assistance, +2 for physical assistance, Ambulation Toilet Transfer Details (indicate cue type and reason): Bil HHA Toileting- Clothing Manipulation and Hygiene: Total assistance Toileting - Clothing Manipulation Details (indicate cue type and reason): min A +2 sit<>stand  Cognition: Cognition Overall Cognitive Status: Impaired/Different from baseline Orientation Level: Other (comment)(per relative oriented to self) Cognition Arousal/Alertness: Awake/alert Behavior During Therapy: Flat affect Overall Cognitive Status: Impaired/Different from baseline Area of Impairment: Following commands, Safety/judgement, Problem solving Following Commands: Follows one step commands inconsistently, Follows one step commands with increased time(increased time for all commands and difficulty following even with gestures and neice and son interpreting) Safety/Judgement: Decreased awareness of safety, Decreased awareness of deficits Problem Solving: Slow processing, Decreased initiation, Difficulty sequencing, Requires verbal cues, Requires tactile cues  Blood pressure (!) 159/94, pulse (!) 58, temperature 97.8 F (36.6 C), temperature source Oral, resp. rate 16, height  (1.676 m), weight 57.2 kg (126 lb 1.7 oz), SpO2 96 %. Physical Exam  Vitals reviewed. Constitutional: He appears well-developed and well-nourished.  HENT:  Head: Normocephalic and atraumatic.    Eyes: EOM are normal. Right eye exhibits no discharge. Left eye exhibits no discharge.  Pupils reactive to light  Neck: Normal range of motion. Neck supple. No thyromegaly present.  Cardiovascular: Normal rate and regular rhythm.  Respiratory: Effort normal and breath sounds normal. No respiratory distress.  GI: Soft. Bowel sounds are normal. He exhibits no distension.  Musculoskeletal:  No edema or tenderness in extremities  Neurological: He is alert.  Non-English-speaking.   Exam overall limited due to language barrier +/- aphasia Unable to follow verbal or demonstrated commands Spontaneously moving LUE/LLE No movement notes on right side  Skin: Skin is warm and dry.  Psychiatric:  Unable to assess due to languate +/- aphasia             Assessment/Plan: Diagnosis: Multifocal left hemisphere infacrt Labs and images independently reviewed.  Records reviewed and summated above. Stroke: Continue secondary stroke prophylaxis and Risk Factor Modification listed below:   Antiplatelet therapy:   Blood Pressure Management:  Continue current medication with prn's with permisive HTN per primary team Statin Agent:   Prediabetes management:   Right sided hemiparesis: fit for orthosis to prevent contractures (resting hand splint for day, wrist cock up splint at night, PRAFO, etc) Motor recovery: Fluoxetine  1. Does the need for close, 24 hr/day medical supervision in concert with the patient's rehab needs make it unreasonable for this patient to be served in a less intensive setting? Potentially  2. Co-Morbidities requiring supervision/potential complications: diastolic dysfunction (monitor for signs/symptoms of fluid overload), history of CVA (start meds), HTN (monitor and provide prns in accordance with increased physical exertion and pain), prediabetes (Monitor in accordance with exercise and adjust meds as necessary) 3. Due to safety, disease management, medication administration  and patient education, does the patient require 24 hr/day rehab nursing?  Potentially 4. Does the patient require coordinated care of a physician, rehab nurse, PT (1-2 hrs/day, 5 days/week), OT (1-2 hrs/day, 5 days/week) and SLP (1-2 hrs/day, 5 days/week) to address physical and functional deficits in the context of the above medical diagnosis(es)? Potentially Addressing deficits in the following areas: balance, endurance, locomotion, strength, transferring, bathing, dressing, toileting, cognition, speech, language, swallowing and psychosocial support 5. Can the patient actively participate in an intensive therapy program of at least 3 hrs of therapy per day at least 5 days per week? Yes 6. The potential for patient to make measurable gains while on inpatient rehab is good 7. Anticipated functional outcomes upon discharge from inpatient rehab are supervision and min assist  with PT, supervision and min assist with OT, min assist with SLP. 8. Estimated rehab length of stay to reach the above functional goals is: ?7-10 days. 9. Anticipated D/C setting: TBD 10. Anticipated post D/C treatments: HH therapy and Home excercise program 11. Overall Rehab/Functional Prognosis: good  RECOMMENDATIONS: This patient's condition is appropriate for continued rehabilitative care in the following setting: Will need to inquire about discharge disposition.  Will consider CIR if patient does not progress with therapies after completion of medical workup. Patient has agreed to participate in recommended program. Potentially Note that insurance prior authorization may be required for reimbursement for recommended care.  Comment: Rehab Admissions Coordinator to follow up.   I have personally performed a face to face diagnostic evaluation, including, but not limited to relevant history and physical exam findings, of this patient and developed relevant assessment and plan.  Additionally, I have reviewed and concur with  the physician assistant's documentation above.   Ankit PateMaryla MorrowBPMR Mcarthur Rossetti Angiulli, PA-C 02/15/2018          Revision History                        Routing History

## 2018-02-19 ENCOUNTER — Inpatient Hospital Stay (HOSPITAL_COMMUNITY): Payer: Medicare Other

## 2018-02-19 ENCOUNTER — Inpatient Hospital Stay (HOSPITAL_COMMUNITY): Payer: Medicare Other | Admitting: Physical Therapy

## 2018-02-19 DIAGNOSIS — D62 Acute posthemorrhagic anemia: Secondary | ICD-10-CM

## 2018-02-19 DIAGNOSIS — I10 Essential (primary) hypertension: Secondary | ICD-10-CM

## 2018-02-19 LAB — MISC LABCORP TEST (SEND OUT): Labcorp test code: 284526

## 2018-02-19 MED ORDER — ORAL CARE MOUTH RINSE
15.0000 mL | Freq: Two times a day (BID) | OROMUCOSAL | Status: DC
  Administered 2018-02-19 – 2018-03-11 (×39): 15 mL via OROMUCOSAL

## 2018-02-19 NOTE — Plan of Care (Signed)
  Problem: RH BLADDER ELIMINATION Goal: RH STG MANAGE BLADDER WITH ASSISTANCE Description STG Manage Bladder With mod Assistance  Outcome: Not Progressing; patient incontinent   Problem: RH KNOWLEDGE DEFICIT Goal: RH STG INCREASE KNOWLEDGE OF DYSPHAGIA/FLUID INTAKE Description Patient Jorge Burns/family will be able understand and increase knowledge of dysphagia/fluid intake  Outcome: Not Progressing; due to language barrier

## 2018-02-19 NOTE — Evaluation (Signed)
Occupational Therapy Assessment and Plan  Patient Details  Name: Jorge Burns MRN: [id number] Date of Birth: 08-Jun-1941  OT Diagnosis: abnormal posture, apraxia, cognitive deficits, disturbance of vision, hemiplegia affecting dominant side, muscle weakness (generalized) and coordination disorder Rehab Potential:   ELOS: 21-24   Today's Date: 02/19/2018 OT Individual Time: 9563-8756 OT Individual Time Calculation (min): 60 min     Problem List:  Patient Active Problem List   Diagnosis Date Noted  . Acute bilat watershed infarction [facility]) 02/18/2018  . Aphasic disturbance   . Right hemiparesis ([place])   . Pulmonary fungal infection   . Diastolic dysfunction   . Prediabetes   . CVA (cerebral vascular accident) ([place]) 02/13/2018  . Influenza B 11/29/2015  . History of CVA (cerebrovascular accident)   . Language barrier   . Benign essential HTN   . Syncope 11/28/2015    Past Medical History:  Past Medical History:  Diagnosis Date  . Stroke [facility])    "a long time ago -- 2 years ago"   Past Surgical History:  Past Surgical History:  Procedure Laterality Date  . IR ANGIO INTRA EXTRACRAN SEL COM CAROTID INNOMINATE BILAT MOD SED  02/15/2018  . IR ANGIO VERTEBRAL SEL SUBCLAVIAN INNOMINATE UNI R MOD SED  02/15/2018  . 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  Per chart review lives with reported niece and assistance as needed.  One level home 3 steps to entry.  Independent prior to admission and still working.  Presented 02/13/2018 with headache, right-sided weakness and slurred speech.  Cranial CT scan reviewed unremarkable for acute intracranial process.  Per report, old right parietal infarct.  Patient did not receive TPA.  MRI showed multifocal acute ischemia within the left hemisphere,  predominantly within the deep watershed zone and left ACA/MCA.  Small subacute infarct within the right side white matter adjacent to the corpus callosum.  MRA of the head with no intracranial occlusion or high-grade stenosis.  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  CTA of head and neck showed 50% right ICA with a 3 to 4 mm posterior projecting ulceration.  Critical stenosis left ICA.  Interventional radiology consulted for cerebral angiogram that showed 95% plus LT ICA proximal stenosis with slow antegrade flow.  Approximately 35% stenosis of right ICA proximal with a 5 mm ulcerated plaque.  Echocardiogram with ejection fraction of 43% grade 1 diastolic dysfunction.  EEG was negative.  Carotid Doppler studies 02/17/2018 showing left ICA stenosis 80 - 99%.  Interventional radiology follow-up plan 1 to 2 weeks for proximal left ICA revascularization.  Presently on aspirin and Plavix for CVA prophylaxis.  Subcutaneous heparin for DVT prophylaxis.  Dysphasia #1 nectar thick liquid diet.  Physical and occupational therapy evaluations completed with recommendations of physical medicine rehab consult     Patient currently requires mod- total with basic self-care skills secondary to muscle weakness, decreased cardiorespiratoy endurance, impaired timing and sequencing, abnormal tone, unbalanced muscle activation, motor apraxia, decreased coordination and decreased motor planning, decreased visual perceptual skills and field cut, decreased midline orientation, decreased attention to right and ideational apraxia, decreased initiation, decreased attention, decreased awareness, decreased problem solving, decreased safety awareness, decreased memory and delayed processing and decreased sitting balance, decreased standing balance, decreased postural control, hemiplegia and decreased balance strategies.  Prior to  hospitalization, patient could complete BADl/IADL with independent .  Patient will benefit from skilled  intervention to decrease level of assist with basic self-care skills and increase independence with basic self-care skills prior to discharge home with care partner.  Anticipate patient will require minimal physical assistance and follow up home health.  OT - End of Session Activity Tolerance: Tolerates 10 - 20 min activity with multiple rests Endurance Deficit: Yes OT Assessment Rehab Potential (ACUTE ONLY): Fair OT Barriers to Discharge: Inaccessible home environment OT Patient demonstrates impairments in the following area(s): Balance;Behavior;Cognition;Endurance;Motor;Nutrition;Perception;Safety;Sensory;Skin Integrity;Vision OT Basic ADL's Functional Problem(s): Eating;Grooming;Bathing;Dressing;Toileting OT Transfers Functional Problem(s): Toilet;Tub/Shower OT Additional Impairment(s): Fuctional Use of Upper Extremity OT Plan OT Intensity: Minimum of 1-2 x/day, 45 to 90 minutes OT Frequency: 5 out of 7 days OT Duration/Estimated Length of Stay: 21-24 OT Treatment/Interventions: Balance/vestibular training;Discharge planning;Functional electrical stimulation;Pain management;Self Care/advanced ADL retraining;Therapeutic Activities;UE/LE Coordination activities;Cognitive remediation/compensation;Disease mangement/prevention;Functional mobility training;Patient/family education;Skin care/wound managment;Therapeutic Exercise;Visual/perceptual remediation/compensation;Community reintegration;DME/adaptive equipment instruction;Neuromuscular re-education;Psychosocial support;Splinting/orthotics;UE/LE Strength taining/ROM;Wheelchair propulsion/positioning OT Self Feeding Anticipated Outcome(s): S OT Basic Self-Care Anticipated Outcome(s): MIN A OT Toileting Anticipated Outcome(s): MOD A toileting; MIN A bathing OT Bathroom Transfers Anticipated Outcome(s): MIN A  OT Recommendation Patient destination: Home Follow Up Recommendations: Home health OT Equipment Recommended: 3 in 1 bedside  comode;Tub/shower seat;Tub/shower bench;To be determined   Skilled Therapeutic Intervention 1:1. Interpreter present throughout session. Pt only verbalizing yes/no to questions 2x during session and following commands ~60% of time with increased time for processing. Pt educated on role/purpose of OT, CIR, ELOS, POC and goals. Pt eats with HOB elevated with supervision and VC/A for small bites, swallowing before taking next bite and wiping R side of face. Pt with 1 cough while eating and 1 cough ~5 min after finishing meal. When presented for options of clothing on R side, pt able to turn head and look at options but unable to indicate with gestures or verbally which is preferred shirt/shorts. Pt  TOTAL A provided for LB dressing at bed level with pt rolling B for advancing pants past hips min A for rolling R and max A rolling L. Pt supine>sitting EOB with MAX A and Vc for sequencing. Pt squat pivot transfer R with MOD A with R knee block. Pt demonstrating pushing in sitting. Pt requires HOH A to initiate reaching for washcloth on R and bringing hand to face. After washing face pt perseverates on this body part prior to moving on to other cued body parts. With [facility] A to thread RUE into hand and pull shirt down trunk, pt able to don shirt with MOD A. Pt completes transfer back to bed with MOD A for lifitng/VC for stedy use and manual facilitation of bending knee to "sit" on stedy flaps. Pt would benefit from more practice with this unfamiliar task as pt tending to push with L side body. Once perched on flaps, pt able to sit with supervision. Exited session with pt seated in bed, call light in reach and all needs met  OT Evaluation Precautions/Restrictions  Precautions Precautions: Fall Restrictions Weight Bearing Restrictions: No General Chart Reviewed: Yes Vital Signs Therapy Vitals Temp: 98.1 F (36.7 C) Temp Source: Oral Pulse Rate: 71 Resp: 16 BP: (!) 143/80 Patient Position (if appropriate):  Lying Oxygen Therapy SpO2: 96 % O2 Device: Room Air Pain Pain Assessment Pain Score: 0-No pain Home Living/Prior Functioning Home Living Type of Home: House Home Access: Stairs to enter [employer] of Steps: 3 Home Layout: One level Bathroom Shower/Tub: Gaffer  Bathroom Toilet: Programmer, systems: Yes Additional Comments: no family avaiable to confirm and pt unable to indicate despite interpreter  Lives With: Friend(s) Prior Function 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 ADL   Vision Baseline Vision/History: No visual deficits Vision Assessment?: Vision impaired- to be further tested in functional context Additional Comments: (R visual field cut/ R inattention) Perception  Perception: Impaired Praxis Praxis: Impaired Praxis Impairment Details: Ideation;Initiation;Motor planning Cognition Overall Cognitive Status: Impaired/Different from baseline Arousal/Alertness: Awake/alert(pt unable to verbalize despite interpreter) Attention: Sustained Sustained Attention: Impaired Awareness: Impaired Problem Solving: Impaired Safety/Judgment: Impaired Sensation Sensation Light Touch: Impaired by gross assessment Proprioception: Impaired by gross assessment Coordination Gross Motor Movements are Fluid and Coordinated: No Fine Motor Movements are Fluid and Coordinated: No Motor  Motor Motor: Hemiplegia Motor - Skilled Clinical Observations: dense R hemi Mobility  Transfers Transfers: Sit to Stand;Stand to Sit Sit to Stand: 1: +1 Total assist Sit to Stand Details: Manual facilitation for placement;Verbal cues for safe use of DME/AE;Verbal cues for precautions/safety;Manual facilitation for weight shifting;Manual facilitation for weight bearing Sit to Stand Details (indicate cue type and reason): stedy lift use Stand to Sit: 1: +1 Total assist Stand to Sit Details (indicate cue type and reason): Manual facilitation for  placement;Verbal cues for safe use of DME/AE;Verbal cues for precautions/safety;Manual facilitation for weight shifting;Manual facilitation for weight bearing Stand to Sit Details: (stedy lift)  Trunk/Postural Assessment  Cervical Assessment Cervical Assessment: Exceptions to WFL(head forward) Thoracic Assessment Thoracic Assessment: Exceptions to WFL(rounded shoulders) Lumbar Assessment Lumbar Assessment: Exceptions to WFL(posterior pelvic tilt) Postural Control Postural Control: Deficits on evaluation(absent)  Balance Balance Balance Assessed: Yes Dynamic Sitting Balance Sitting balance - Comments: (min A static sitting; mod-max A dynamic- pt pushing) Static Standing Balance Static Standing - Level of Assistance: 2: Max assist Static Standing - Comment/# of Minutes: standing in stedy, pt demo pushing Extremity/Trunk Assessment RUE Assessment RUE Assessment: Exceptions to WFL(dense R hemi) LUE Assessment LUE Assessment: Within Functional Limits   See Function Navigator for Current Functional Status.   Refer to Care Plan for Long Term Goals  Recommendations for other services: Therapeutic Recreation  Pet therapy   Discharge Criteria: Patient will be discharged from OT if patient refuses treatment 3 consecutive times without medical reason, if treatment goals not met, if there is a change in medical status, if patient makes no progress towards goals or if patient is discharged from hospital.  The above assessment, treatment plan, treatment alternatives and goals were discussed and mutually agreed upon: No family available/patient unable  Tonny Branch 02/19/2018, 9:49 AM

## 2018-02-19 NOTE — Progress Notes (Signed)
Right groin puncture with bruising surrounding the area. No drainage noted. Gauze dressing applied. MD has seen the site, will monitor.

## 2018-02-19 NOTE — Evaluation (Signed)
Speech Language Pathology Assessment and Plan  Patient Details  Name: Jorge Burns MRN: [id number] Date of Birth: 11/14/1940  SLP Diagnosis: Aphasia;Dysphagia;Cognitive Impairments  Rehab Potential: Fair ELOS: 21-28 days     Today's Date: 02/19/2018 SLP Individual Time: 1000-1100     Problem List:  Patient Active Problem List   Diagnosis Date Noted  . 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 Aphasic disturbance   . Right hemiparesis ([place])   . Pulmonary fungal infection   . Diastolic dysfunction   . Prediabetes   . CVA (cerebral vascular accident) ([place]) 02/13/2018  . Influenza B 11/29/2015  . History of CVA (cerebrovascular accident)   . Language barrier   . Benign essential HTN   . Syncope 11/28/2015   Past Medical History:  Past Medical History:  Diagnosis Date  . Stroke [facility])    "a long time ago -- 2 years ago"   Past Surgical History:  Past Surgical History:  Procedure Laterality Date  . IR ANGIO INTRA EXTRACRAN SEL COM CAROTID INNOMINATE BILAT MOD SED  02/15/2018  . IR ANGIO VERTEBRAL SEL SUBCLAVIAN INNOMINATE UNI R MOD SED  02/15/2018  . IR ANGIO VERTEBRAL SEL VERTEBRAL UNI L MOD SED  02/15/2018    Assessment / Plan / Recommendation Clinical Impression 77 year old right-handed [place] non-English-speaking male with history of reported CVA 2017 patient not on aspirin or statin drug.  Per chart review lives with reported niece and assistance as needed.  One level home 3 steps to entry.  Independent prior to admission and still working.  Presented 02/13/2018 with headache, right-sided weakness and slurred speech.  Cranial CT scan reviewed unremarkable for acute intracranial process.  Per report, old right parietal infarct.  Patient did not receive TPA.  MRI showed multifocal acute ischemia within the left hemisphere, predominantly within the deep watershed zone and left ACA/MCA.  Small subacute infarct within the right side white matter adjacent to the corpus  callosum.  MRA of the head with no intracranial occlusion or high-grade stenosis.  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  CTA of head and neck showed 50% right ICA with a 3 to 4 mm posterior projecting ulceration.  Critical stenosis left ICA.  Interventional radiology consulted for cerebral angiogram that showed 95% plus LT ICA proximal stenosis with slow antegrade flow.  Approximately 35% stenosis of right ICA proximal with a 5 mm ulcerated plaque.  Echocardiogram with ejection fraction of 15% grade 1 diastolic dysfunction.  EEG was negative.  Carotid Doppler studies 02/17/2018 showing left ICA stenosis 80 - 99%.  Presently on aspirin and Plavix for CVA prophylaxis.  Subcutaneous heparin for DVT prophylaxis.  Dysphasia #1 nectar thick liquid diet.  Physical and occupational therapy evaluations completed with recommendations of physical medicine rehab consult.  Pt presents with severe expressive and receptive aphasia, interpreter present for examination. Pt demonstrates ability to follow highly contextual/functional 1 step commands  (ADLS) with 17% accuracy given Max A demonstration cues, and minimal attempts to vocalize (opening mouth) and producing grunts. Pt was unable to imitate functional commands, imitate/initate automatic responses, response to basic yes/no questions, name common objects or select object from a field of two with max A cues during informal cognitive linguistic assessment. Pt demonstarted withdrawn behavior and decrease expressive/recpetive ability compared to treatment session three days piror, suggest behavioral component impacting participate along with severe deficit. Pt demonstrated severe deficits in sustained attention and likely visual/precptual deficits. Pt demonstrates moderate sensorimotor dysphagia, marked by decrease bolus control/cohesion and right  buccal residue on dys 3 and regular textures and overt s/s aspiration (immdeaite cough) on 1/3 of ice chips, thin via TSP and  cup, which suggest possible increase in sensation but likely continued silent aspiration, consistent with 02/16/2018 MBS findings of silent aspiration on thin liquids. SLP recommends continuing dys 1 and NTL diet at this time. Pt would benefit from skilled ST services in order to maximize functional independence and reduce burden of care prior to discharge, requiring continues skilled ST services and 24 hour supervision upon discharge.    Skilled Therapeutic Interventions          Skilled ST services focused on cognitive skills. SLP facilitated receptive language utilizing common objects from [facility] toolkit, pt required total A hand over hand to select objects from field of two and hand over hand to imitation functional motion. Pt demonstrated no verbalization given max A multimodal cues with minimal attempts to open mouth to vocalize and occasional grunts noted. Pt was left in room with call bell within reach. Reccomend to continue skilled ST services.    SLP Assessment  Patient will need skilled Speech Lanaguage Pathology Services during CIR admission    Recommendations  SLP Diet Recommendations: Dysphagia 1 (Puree);Nectar Liquid Administration via: Cup Medication Administration: Whole meds with puree Supervision: Staff to assist with self feeding;Full supervision/cueing for compensatory strategies Compensations: Slow rate;Small sips/bites;Minimize environmental distractions;Lingual sweep for clearance of pocketing Postural Changes and/or Swallow Maneuvers: Seated upright 90 degrees Oral Care Recommendations: Oral care BID Recommendations for Other Services: Neuropsych consult(when appropriate due to aphasia) Patient destination: Home Follow up Recommendations: Skilled Nursing facility;Outpatient SLP;Home Health SLP;24 hour supervision/assistance Equipment Recommended: None recommended by SLP    SLP Frequency 3 to 5 out of 7 days   SLP Duration  SLP Intensity  SLP Treatment/Interventions  21-28 days   Minumum of 1-2 x/day, 30 to 90 minutes  Cognitive remediation/compensation;Cueing hierarchy;Dysphagia/aspiration precaution training;Functional tasks    Pain Pain Assessment Pain Scale: Faces Faces Pain Scale: No hurt  Prior Functioning Type of Home: House  Lives With: Family Available Help at Discharge: Other (Comment)(niece) Vocation: Full time employment  Function:  Eating Eating   Modified Consistency Diet: Yes Eating Assist Level: Supervision or verbal cues;More than reasonable amount of time;Helper brings food to mouth;Helper feeds patient           Cognition Comprehension Comprehension assist level: Understands basic less than 25% of the time/ requires cueing >75% of the time  Expression   Expression assist level: Expresses basis less than 25% of the time/requires cueing >75% of the time.  Social Interaction Social Interaction assist level: Interacts appropriately less than 25% of the time. May be withdrawn or combative.  Problem Solving Problem solving assist level: Solves basic less than 25% of the time - needs direction nearly all the time or does not effectively solve problems and may need a restraint for safety  Memory Memory assist level: Recognizes or recalls less than 25% of the time/requires cueing greater than 75% of the time   Short Term Goals: Week 1: SLP Short Term Goal 1 (Week 1): Pt will initiate functional self-care activities with 50% accuracy given Max A multimodal cues.  SLP Short Term Goal 2 (Week 1): Pt will convey wants/needs with nonverbal communication ( gestures, facial expressions) with Max A multimodal cues.  SLP Short Term Goal 3 (Week 1): Pt will follow 1 step functional directions related to self with 50% accuracy and Max A multimodal cues SLP Short Term Goal 4 (  Week 1): Pt will vocalize VC approximations in 50% of opportunties with Max A multimodal cues.  SLP Short Term Goal 5 (Week 1): Pt will consume ice chips/thin via TSP  trials with overt s/s aspiration to demonstrate readiness for instrumental swallow assessment. SLP Short Term Goal 6 (Week 1): Pt will demonstrate efficient mastication, increased control of dys 2 trials and min overt s/s aspiration as readiness for diet advancement,  Refer to Care Plan for Long Term Goals  Recommendations for other services: Neuropsych  Discharge Criteria: Patient will be discharged from SLP if patient refuses treatment 3 consecutive times without medical reason, if treatment goals not met, if there is a change in medical status, if patient makes no progress towards goals or if patient is discharged from hospital.  The above assessment, treatment plan, treatment alternatives and goals were discussed and mutually agreed upon: by patient  Zeb Rawl  [facility] 02/19/2018, 5:16 PM

## 2018-02-19 NOTE — Evaluation (Addendum)
Physical Therapy Assessment and Plan  Patient Details  Name: Jorge Burns MRN: [id number] Date of Birth: 1941/05/28  PT Diagnosis: Cognitive deficits, Coordination disorder, Difficulty walking, Hemiplegia (dominance unknown), Impaired cognition and Muscle weakness Rehab Potential: Good ELOS: 21-24 days   Today's Date: 02/19/2018 PT Individual Time: 1315-1400 PT Individual Time Calculation (min): 45 min  and Today's Date: 02/19/2018 PT Missed Time: 15 Minutes Missed Time Reason: Patient fatigue;Patient unwilling to participate(pt very withdrawn, appeared unwilling to participate)   Problem List:  Patient Active Problem List   Diagnosis Date Noted  . 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 Aphasic disturbance   . Right hemiparesis ([place])   . Pulmonary fungal infection   . Diastolic dysfunction   . Prediabetes   . CVA (cerebral vascular accident) ([place]) 02/13/2018  . Influenza B 11/29/2015  . History of CVA (cerebrovascular accident)   . Language barrier   . Benign essential HTN   . Syncope 11/28/2015    Past Medical History:  Past Medical History:  Diagnosis Date  . Stroke [facility])    "a long time ago -- 2 years ago"   Past Surgical History:  Past Surgical History:  Procedure Laterality Date  . IR ANGIO INTRA EXTRACRAN SEL COM CAROTID INNOMINATE BILAT MOD SED  02/15/2018  . IR ANGIO VERTEBRAL SEL SUBCLAVIAN INNOMINATE UNI R MOD SED  02/15/2018  . IR ANGIO VERTEBRAL SEL VERTEBRAL UNI L MOD SED  02/15/2018    Assessment & Plan Clinical Impression: Patient is a 77 year old right-handed [place] non-English-speaking male with history of reported CVA 2017 patient not on aspirin or statin drug. Per chart review lives with reported niece and assistance as needed. One level home 3 steps to entry. Independent prior to admission and still working. Presented 02/13/2018 with headache, right-sided weakness and slurred speech. Cranial CT scan reviewed unremarkable for acute  intracranial process. Per report, old right parietal infarct. Patient did not receive TPA. MRI showed multifocal acute ischemia within the left hemisphere, predominantly within the deep watershed zone and left ACA/MCA. Small subacute infarct within the right side white matter adjacent to the corpus callosum. MRA of the head with no intracranial occlusion or high-grade stenosis. 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 CTA of head and neck showed 50% right ICA with a 3 to 4 mm posterior projecting ulceration. Critical stenosis left ICA. Interventional radiology consulted for cerebral angiogram that showed 95% plus LT ICA proximal stenosis with slow antegrade flow. Approximately 35% stenosis of right ICA proximal with a 5 mm ulcerated plaque. Echocardiogram with ejection fraction of 40% grade 1 diastolic dysfunction. EEG was negative. Carotid Doppler studies 02/17/2018 showing left ICA stenosis 80 -99%.Interventional radiology follow-up plan 1 to 2 weeks for proximal left ICA revascularization. Presently on aspirin and Plavix for CVA prophylaxis. Subcutaneous heparin for DVT prophylaxis. Dysphasia #1 nectar thick liquid diet. Patient transferred to CIR on 02/18/2018 .   Patient currently requires max with mobility secondary to muscle weakness, decreased cardiorespiratoy endurance, impaired timing and sequencing, unbalanced muscle activation, motor apraxia, ataxia, decreased coordination and decreased motor planning, decreased visual perceptual skills, decreased attention to right, decreased initiation, decreased attention, decreased awareness, decreased problem solving, decreased safety awareness, decreased memory and delayed processing and decreased sitting balance, decreased standing balance, decreased postural control, hemiplegia and decreased balance strategies.  Prior to hospitalization, patient was independent  with mobility and lived with Family in a House home.  Home access is 3Stairs to  enter.  Patient will benefit from skilled  PT intervention to maximize safe functional mobility, minimize fall risk and decrease caregiver burden for planned discharge home with 24 hour assist.  Anticipate patient will benefit from follow up [facility] at discharge.  PT - End of Session Activity Tolerance: Tolerates < 10 min activity, no significant change in vital signs Endurance Deficit: Yes Endurance Deficit Description: decreased PT Assessment Rehab Potential (ACUTE/IP ONLY): Good PT Barriers to Discharge: Behavior PT Barriers to Discharge Comments: Pt seems very withdrawn, unwilling to perform mobility w/o max encouragement and demonstration, could additionally be due to language barrier, will continue to assess PT Patient demonstrates impairments in the following area(s): Balance;Behavior;Endurance;Motor;Perception;Safety PT Transfers Functional Problem(s): Bed Mobility;Bed to Chair;Car;Furniture;Floor PT Locomotion Functional Problem(s): Wheelchair Mobility;Ambulation;Stairs PT Plan PT Intensity: Minimum of 1-2 x/day ,45 to 90 minutes PT Frequency: 5 out of 7 days PT Duration Estimated Length of Stay: 21-24 days PT Treatment/Interventions: Ambulation/gait training;Cognitive remediation/compensation;Discharge planning;DME/adaptive equipment instruction;Functional mobility training;Pain management;Psychosocial support;Splinting/orthotics;Therapeutic Activities;UE/LE Strength taining/ROM;Visual/perceptual remediation/compensation;Wheelchair propulsion/positioning;UE/LE Coordination activities;Therapeutic Exercise;Stair training;Skin care/wound management;Patient/family education;Neuromuscular re-education;Functional electrical stimulation;Disease management/prevention;Community reintegration;Balance/vestibular training PT Transfers Anticipated Outcome(s): Min assist PT Locomotion Anticipated Outcome(s): Min assist short distance gait PT Recommendation Follow Up Recommendations: Home health  PT Patient destination: Home Equipment Recommended: To be determined  Skilled Therapeutic Intervention  Pt in supine, interpreter present. No evidence or c/o pain. Pt very withdrawn and non-communicative to therapist or interpreter throughout session. All PLOF and home living set-up taken from chart review. Required mod-max encouragement to get OOB, unsure if 2/2 poor initiation/apraxia or unwillingness to participate. Suspect unwillingness or refusal as pt able to follow some demonstration cues for mobility w/o delay. Performed functional mobility as detailed below including bed mobility, transfers both w/ stedy and squat pivot w/ max assist. Max assist to boost into stedy as well. Pt w/ increased L pushing tendencies in standing requiring total assist to manually correct. Switched w/c to hemiheight w/c 2/2 pt's height. Pt able to self-propel w/ max encouragement and multimodal cues, 50' w/ min assist using L hemi technique. Returned to room and pt instructed patient in PT Evaluation; see below for results. Pt educated patient in [place], rehab potential, rehab goals, and discharge recommendations. Unsure pt's acceptance or understanding of information, interpreter present but no family members. Will continue to update family as they become available. Ended session in w/c, call bell within reach and all needs met. Chair alarm and quick release belt donned.   PT Evaluation Precautions/Restrictions Precautions Precautions: Fall Precaution Comments: non-English speaking Restrictions Weight Bearing Restrictions: No Pain Pain Assessment Pain Scale: Faces Pain Score: 0-No pain Faces Pain Scale: No hurt Home Living/Prior Functioning Home Living Available Help at Discharge: Other (Comment)(niece) Type of Home: House Home Access: Stairs to enter [employer] of Steps: 3 Home Layout: One level Bathroom Shower/Tub: Multimedia programmer: Standard Bathroom Accessibility:  Yes Additional Comments: no family avaiable to confirm and pt unable to indicate despite interpreter  Lives With: Family Prior Function Level of Independence: Independent with basic ADLs;Independent with transfers;Independent with gait;Independent with homemaking with ambulation  Able to Take Stairs?: Yes Driving: (unsure) Vocation: Full time employment Vocation Requirements: works at [employer] Vision/Perception  Vision - Assessment Additional Comments: R inattention/visual field cut observed Perception Perception: Impaired Praxis Praxis: Impaired Praxis Impairment Details: Ideation;Initiation;Motor planning  Cognition Overall Cognitive Status: Impaired/Different from baseline Arousal/Alertness: Awake/alert Orientation Level: Other (comment);Oriented X4(pt w/ no response to therapist's questions despite interpreter present) Attention: Sustained Focused Attention: Appears intact Sustained Attention: Impaired Awareness: Impaired Problem Solving: Impaired  Problem Solving Impairment: Verbal basic;Functional basic Behaviors: Other (comment)(limited eye contact seemed to not want to partictpate) Safety/Judgment: Impaired Comments: Difficult to assess 2/2 aphasia, pt w/ no eye contact or verbalizations w/ therapist or interpreter during session, appeared very withdrawn Sensation Sensation Light Touch: (unable to assess 2/2 cognition) Coordination Gross Motor Movements are Fluid and Coordinated: No Fine Motor Movements are Fluid and Coordinated: No Motor  Motor Motor: Hemiplegia Motor - Skilled Clinical Observations: dense R hemi  Mobility Bed Mobility Bed Mobility: Rolling Right;Rolling Left;Supine to Sit;Sit to Supine Rolling Right: 3: Mod assist Rolling Right Details: Manual facilitation for weight shifting;Manual facilitation for placement;Manual facilitation for weight bearing;Tactile cues for initiation Rolling Left: 3: Mod assist Rolling Left Details: Tactile cues  for initiation;Manual facilitation for weight bearing;Manual facilitation for weight shifting;Manual facilitation for placement Supine to Sit: 3: Mod assist Supine to Sit Details: Tactile cues for initiation;Manual facilitation for weight bearing;Manual facilitation for weight shifting;Manual facilitation for placement Sit to Supine: 3: Mod assist Sit to Supine - Details: Tactile cues for initiation;Manual facilitation for weight shifting;Manual facilitation for placement;Manual facilitation for weight bearing Transfers Transfers: Yes Sit to Stand: 2: Max assist Sit to Stand Details: Manual facilitation for placement;Verbal cues for safe use of DME/AE;Verbal cues for precautions/safety;Manual facilitation for weight shifting;Manual facilitation for weight bearing Sit to Stand Details (indicate cue type and reason): sit<>stand to stedy Stand to Sit: 2: Max assist Stand to Sit Details (indicate cue type and reason): Manual facilitation for placement;Verbal cues for safe use of DME/AE;Verbal cues for precautions/safety;Manual facilitation for weight shifting;Manual facilitation for weight bearing Squat Pivot Transfers: 2: Max assist Squat Pivot Transfer Details: Tactile cues for initiation;Manual facilitation for weight shifting;Verbal cues for technique;Verbal cues for precautions/safety;Manual facilitation for placement;Tactile cues for weight shifting;Tactile cues for placement;Manual facilitation for weight bearing Transfer via Lift Equipment: Stedy Locomotion  Ambulation Ambulation: No Gait Gait: No Stairs / Additional Locomotion Stairs: No Architect: Yes Wheelchair Assistance: 4: Advertising account executive Details: Tactile cues for initiation;Verbal cues for sequencing;Verbal cues for technique;Manual facilitation for placement;Manual facilitation for weight bearing Wheelchair Propulsion: Left upper extremity;Left lower extremity Wheelchair Parts  Management: Needs assistance Distance: 36'  Trunk/Postural Assessment  Cervical Assessment Cervical Assessment: Within Functional Limits Thoracic Assessment Thoracic Assessment: Within Functional Limits Lumbar Assessment Lumbar Assessment: Within Functional Limits Postural Control Postural Control: Deficits on evaluation(delayed/absent)  Balance Balance Balance Assessed: Yes Static Sitting Balance Static Sitting - Balance Support: No upper extremity supported;Feet supported Static Sitting - Level of Assistance: 5: Stand by assistance Dynamic Sitting Balance Dynamic Sitting - Balance Support: No upper extremity supported;Feet supported Dynamic Sitting - Level of Assistance: 3: Mod assist Static Standing Balance Static Standing - Level of Assistance: 2: Max assist Static Standing - Comment/# of Minutes: in stedy Extremity Assessment  RLE Assessment RLE Assessment: Exceptions to WFL(0/5, no movement observed throughout session during functional activity, unable to formally assess 2/2 cognition) LLE Assessment LLE Assessment: Within Functional Limits(Pt moving extremity against gravity throughout session, able to mimic therapist's movements, unable to formally assess 2/2 cognition)   See Function Navigator for Current Functional Status.   Refer to Care Plan for Long Term Goals  Recommendations for other services: None   Discharge Criteria: Patient will be discharged from PT if patient refuses treatment 3 consecutive times without medical reason, if treatment goals not met, if there is a change in medical status, if patient makes no progress towards goals or if patient is discharged from hospital.  The  above assessment, treatment plan, treatment alternatives and goals were discussed and mutually agreed upon: by patient  Anurag Scarfo K Arnette 02/19/2018, 3:14 PM

## 2018-02-19 NOTE — Progress Notes (Signed)
Unable to complete admission per church members patient does not have family here; he lives with co church member.

## 2018-02-19 NOTE — Progress Notes (Signed)
Jorge Burns PHYSICAL MEDICINE & REHABILITATION     PROGRESS NOTE  Subjective/Complaints:  Patient seen lying in bed this morning.  No reported issues overnight.  ROS: Limited due to language  Objective: Vital Signs: Blood pressure (!) 143/80, pulse 71, temperature 98.1 F (36.7 C), temperature source Oral, resp. rate 16, weight 57.8 kg (127 lb 6.8 oz), SpO2 96 %. No results found. Recent Labs    02/18/18 1912  WBC 7.6  HGB 12.6*  HCT 38.3*  PLT 250   Recent Labs    02/18/18 1912  CREATININE 1.08   CBG (last 3)  No results for input(s): GLUCAP in the last 72 hours.  Wt Readings from Last 3 Encounters:  02/18/18 57.8 kg (127 lb 6.8 oz)  02/13/18 57.2 kg (126 lb 1.7 oz)  02/11/18 63.5 kg (140 lb)    Physical Exam:  BP (!) 143/80 (BP Location: Right Arm)   Pulse 71   Temp 98.1 F (36.7 C) (Oral)   Resp 16   Wt 57.8 kg (127 lb 6.8 oz)   SpO2 96%   BMI 20.57 kg/m  Constitutional: He appearswell-developed.  Well-nourished.  NAD. HENT: Normocephalic.  Atraumatic. Eyes:EOMI.  No discharge. Cardiovascular:Normal rateand regular rhythm.  No JVD. Respiratory:Effort normal.  Clear.  GI: He exhibitsno distension.  Bowel sounds normal. Musculoskeletal:No edema.  No tenderness. Neurological: Non-English-speaking [place] male.  ?aphasia vs culture language barrier.  Dense right hemiparesis upper and lower ext (0/5).  Spontaneously moving LUE/LLE Skin: Angios site with mild erythema and induration Psychiatric:Unable to assess due to language  Assessment/Plan: 1. Functional deficits secondary to multifocal bilateral infarcts which require 3+ hours per day of interdisciplinary therapy in a comprehensive inpatient rehab setting. Physiatrist is providing close team supervision and 24 hour management of active medical problems listed below. Physiatrist and rehab team continue to assess barriers to discharge/monitor patient progress toward functional and medical  goals.  Function:  Bathing Bathing position      Bathing parts Body parts bathed by patient: Right arm, Chest, Abdomen Body parts bathed by helper: Back, Left arm  Bathing assist        Upper Body Dressing/Undressing Upper body dressing   What is the patient wearing?: Pull over shirt/dress     Pull over shirt/dress - Perfomed by patient: Thread/unthread left sleeve, Put head through opening Pull over shirt/dress - Perfomed by helper: Thread/unthread right sleeve, Pull shirt over trunk        Upper body assist        Lower Body Dressing/Undressing Lower body dressing   What is the patient wearing?: Pants, Non-skid slipper socks       Pants- Performed by helper: Pull pants up/down, Thread/unthread left pants leg, Thread/unthread right pants leg   Non-skid slipper socks- Performed by helper: Don/doff right sock, Don/doff left sock                  Lower body assist        Toileting Toileting          Toileting assist     Transfers Chair/bed transfer   Chair/bed transfer method: Squat pivot, Other(performed both squat pivot and stedy transfers during session) Chair/bed transfer assist level: Maximal assist (Pt 25 - 49%/lift and lower) Chair/bed transfer assistive device: Mechanical lift Mechanical lift: Stedy   Locomotion Ambulation Ambulation activity did not occur: Safety/medical concerns         Wheelchair   Type: Manual Max wheelchair distance: 5150' Assist Level: Touching or  steadying assistance (Pt > 75%)  Cognition Comprehension Comprehension assist level: Understands basic less than 25% of the time/ requires cueing >75% of the time  Expression Expression assist level: Expresses basis less than 25% of the time/requires cueing >75% of the time.  Social Interaction Social Interaction assist level: Interacts appropriately less than 25% of the time. May be withdrawn or combative.  Problem Solving Problem solving assist level: Solves basic less  than 25% of the time - needs direction nearly all the time or does not effectively solve problems and may need a restraint for safety  Memory Memory assist level: Recognizes or recalls less than 25% of the time/requires cueing greater than 75% of the time    Medical Problem List and Plan: 1.Right-sided weakness and slurred speechsecondary to multifocal acute ischemia within the left hemisphere, predominantly within the deep watershed zone and left ACA/MCA cortical watershed zone. Small subacute infarct within the right side white matter adjacent to the corpus callosum splenium on 5/26  Begin CIR   Notes reviewed, images reviewed, labs reviewed 2. DVT Prophylaxis/Anticoagulation: Subcutaneous heparin 3. Pain Management:Tylenol as needed 4. Mood:Provide emotional support 5. Neuropsych: This patientis? capable of making decisions on hisown behalf. 6. Skin/Wound Care:Routine skin checks 7. Fluids/Electrolytes/Nutrition:Routine in and outs  BMP within acceptable range on 5/29, labs pending Encourage PO 8.ICA stenosis.Follow up IR for possibleICA revascularization. Procedure based on timeframe of rehab services 9.Hyperlipidemia. Lipitor 10.  Hypertension  Monitor with increased mobility 11. ABLA  Hb 12.6 on 5/31  Labs pending  LOS (Days) 1 A FACE TO FACE EVALUATION WAS PERFORMED  [name] [name] 02/19/2018 6:46 PM

## 2018-02-20 ENCOUNTER — Encounter (HOSPITAL_COMMUNITY): Payer: Self-pay | Admitting: *Deleted

## 2018-02-20 ENCOUNTER — Inpatient Hospital Stay (HOSPITAL_COMMUNITY): Payer: Medicare Other | Admitting: Physical Therapy

## 2018-02-20 DIAGNOSIS — I69391 Dysphagia following cerebral infarction: Secondary | ICD-10-CM

## 2018-02-20 LAB — ASPERGILLUS ANTIBODY BY IMMUNODIFF
Aspergillus flavus: NEGATIVE
Aspergillus fumigatus, IgG: NEGATIVE
Aspergillus niger: NEGATIVE

## 2018-02-20 MED ORDER — PNEUMOCOCCAL VAC POLYVALENT 25 MCG/0.5ML IJ INJ
0.5000 mL | INJECTION | INTRAMUSCULAR | Status: AC
  Administered 2018-02-21: 0.5 mL via INTRAMUSCULAR
  Filled 2018-02-20: qty 0.5

## 2018-02-20 MED ORDER — LISINOPRIL 2.5 MG PO TABS
2.5000 mg | ORAL_TABLET | Freq: Every day | ORAL | Status: DC
  Administered 2018-02-20 – 2018-03-12 (×21): 2.5 mg via ORAL
  Filled 2018-02-20 (×21): qty 1

## 2018-02-20 NOTE — Progress Notes (Signed)
Physical Therapy Session Note  Patient Details  Name: Jorge Burns MRN: [id number] Date of Birth: 12/24/1940  Today's Date: 02/20/2018 PT Individual Time: 1100-1158 PT Individual Time Calculation (min): 58 min   Short Term Goals: Week 1:  PT Short Term Goal 1 (Week 1): Pt will initiate gait training PT Short Term Goal 2 (Week 1): Pt will transfer bed<>chair w/ mod assist PT Short Term Goal 3 (Week 1): Pt will tolerate 30 min of OOB activity w/o increase in fatigue PT Short Term Goal 4 (Week 1): Pt will maintain static standing balance w/ min assist  PT Short Term Goal 5 (Week 1): Pt will follow verbal 1-step commands 50% of the time  Skilled Therapeutic Interventions/Progress Updates:  Pt was seen bedside in the am wit interpreter present to assist with translation. Pt rolled R/L with side rails and min to mod A multiple times to assist with hygiene and dressing. Pt transferred supine to edge of bed with mod A and cues. Pt transferred edge of bed to w/c with max A and cues. Pt transported to rehab gym. Treatment in gym focused on NMR in parallel bars. Pt stood x 9 with min to mod A in parallel bars. Pt's standing balance fluctuated between c/s to mod A. Pt's standing tolerance fluctuated between 20 to 60 seconds. While standing focused on upright posture, weght shifting and pre gait activities. Pt returned to room following treatment and left sitting up in w/c with quick release belt in place and call bell within reach.   Therapy Documentation Precautions:  Precautions Precautions: Fall Precaution Comments: non-English speaking Restrictions Weight Bearing Restrictions: No General:   Pain: No c/o or signs of pain noted during treatment.   See Function Navigator for Current Functional Status.   Therapy/Group: Individual Therapy  Rayford HalstedMitchell, Onofre Gains G 02/20/2018, 12:22 PM

## 2018-02-20 NOTE — Plan of Care (Signed)
  Problem: RH BLADDER ELIMINATION Goal: RH STG MANAGE BLADDER WITH ASSISTANCE Description STG Manage Bladder With mod Assistance  Outcome: Not Progressing; incontinent   Problem: RH KNOWLEDGE DEFICIT Goal: RH STG INCREASE KNOWLEDGE OF DYSPHAGIA/FLUID INTAKE Description Patient Jorge Burns/family will be able understand and increase knowledge of dysphagia/fluid intake  Outcome: Not Progressing; due to language barrier

## 2018-02-20 NOTE — IPOC Note (Addendum)
Overall Plan of Care [facility](IPOC) Patient Details Name: Jorge Burns MRN: [id number] DOB: 05/28/1941  Admitting Diagnosis: <principal problem not specified>  Hospital Problems: Active Problems:   Right hemiparesis (HCC)   Acute bilat watershed infarction [facility](HCC)   Acute blood loss anemia   Essential hypertension   Dysphagia, post-stroke     Functional Problem List: Nursing Bladder, Bowel, Medication Management, Endurance, Nutrition, Pain, Perception, Safety, Sensory, Skin Integrity  PT Balance, Behavior, Endurance, Motor, Perception, Safety  OT Balance, Behavior, Cognition, Endurance, Motor, Nutrition, Perception, Safety, Sensory, Skin Integrity, Vision  SLP Behavior, Cognition  TR         Basic ADL's: OT Eating, Grooming, Bathing, Dressing, Toileting     Advanced  ADL's: OT       Transfers: PT Bed Mobility, Bed to Chair, Car, State Street CorporationFurniture, Civil Service fast [place]  OT Toilet, Research scientist (life sciences)Tub/Shower     Locomotion: PT Psychologist, prison and probation servicesWheelchair Mobility, Ambulation, Stairs     Additional Impairments: OT Fuctional Use of Upper Extremity  SLP Swallowing, Communication comprehension, expression    TR      Anticipated Outcomes Item Anticipated Outcome  Self Feeding S  Swallowing  Supervision A   Basic self-care  MIN A  Toileting  MOD A toileting; MIN A bathing   Bathroom Transfers MIN A   Bowel/Bladder  mod assist  Transfers  Min assist  Locomotion  Min assist short distance gait  Communication  Min A  Cognition  Min A  Pain  3 or less  Safety/Judgment  mod assist   Therapy Plan: PT Intensity: Minimum of 1-2 x/day ,45 to 90 minutes PT Frequency: 5 out of 7 days PT Duration Estimated Length of Stay: 21-24 days OT Intensity: Minimum of 1-2 x/day, 45 to 90 minutes OT Frequency: 5 out of 7 days OT Duration/Estimated Length of Stay: 21-24 SLP Intensity: Minumum of 1-2 x/day, 30 to 90 minutes SLP Frequency: 3 to 5 out of 7 days SLP Duration/Estimated Length of Stay: 21-28 days     Team  Interventions: Nursing Interventions Patient/Family Education, Disease Management/Prevention, Skin Care/Wound Management, Bladder Management, Bowel Management, Medication Management, Pain Management, Cognitive Remediation/Compensation, Dysphagia/Aspiration Precaution Training, Discharge Planning  PT interventions Ambulation/gait training, Cognitive remediation/compensation, Discharge planning, DME/adaptive equipment instruction, Functional mobility training, Pain management, Psychosocial support, Splinting/orthotics, Therapeutic Activities, UE/LE Strength taining/ROM, Visual/perceptual remediation/compensation, Wheelchair propulsion/positioning, UE/LE Coordination activities, Therapeutic Exercise, Stair training, Skin care/wound management, Patient/family education, Neuromuscular re-education, Functional electrical stimulation, Disease management/prevention, FirefighterCommunity reintegration, Warden/rangerBalance/vestibular training  OT Interventions Warden/rangerBalance/vestibular training, Discharge planning, Functional electrical stimulation, Pain management, Self Care/advanced ADL retraining, Therapeutic Activities, UE/LE Coordination activities, Cognitive remediation/compensation, Disease mangement/prevention, Functional mobility training, Patient/family education, Skin care/wound managment, Therapeutic Exercise, Visual/perceptual remediation/compensation, FirefighterCommunity reintegration, Fish farm managerDME/adaptive equipment instruction, Neuromuscular re-education, Psychosocial support, Splinting/orthotics, UE/LE Strength taining/ROM, Wheelchair propulsion/positioning  SLP Interventions Cognitive remediation/compensation, Cueing hierarchy, Dysphagia/aspiration precaution training, Functional tasks  TR Interventions    SW/CM Interventions  Psychosocial Assessment, Pt & Family Education & Discharge Planning   Barriers to Discharge MD  Medical stability  Nursing      PT Behavior Pt seems very withdrawn, unwilling to perform mobility w/o max encouragement and  demonstration, could additionally be due to language barrier, will continue to assess  OT Inaccessible home environment    SLP Decreased caregiver support, Lack of/limited family support    SW       Team Discharge Planning: Destination: PT-Home ,OT- Home , SLP-Home Projected Follow-up: PT-Home health PT, OT-  Home health OT, SLP-Skilled Nursing facility, Outpatient SLP, Home Health SLP, 24 hour supervision/assistance Projected  Equipment Needs: PT-To be determined, OT- 3 in 1 bedside comode, Tub/shower seat, Tub/shower bench, To be determined, SLP-None recommended by SLP Equipment Details: PT- , OT-  Patient/family involved in discharge planning: PT- Patient, Other (Comment)(interpreter present),  OT-Patient unable/family or caregiver not available, SLP-Patient unable/family or caregive not available  MD ELOS: 14-18d Medical Rehab Prognosis:  Good Assessment: 77 year old right-handed [place] non-English-speaking male with history of reported CVA 2017 patient not on aspirin or statin drug. Per chart review lives with reported niece and assistance as needed. One level home 3 steps to entry. Independent prior to admission and still working. Presented 02/13/2018 with headache, right-sided weakness and slurred speech. Cranial CT scan reviewed unremarkable for acute intracranial process. Per report, old right parietal infarct. Patient did not receive TPA. MRI showed multifocal acute ischemia within the left hemisphere, predominantly within the deep watershed zone and left ACA/MCA. Small subacute infarct within the right side white matter adjacent to the corpus callosum. MRA of the head with no intracranial occlusion or high-grade stenosis. 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 CTA of head and neck showed 50% right ICA with a 3 to 4 mm posterior projecting ulceration. Critical stenosis left ICA. Interventional radiology consulted for cerebral angiogram that showed 95% plus LT ICA  proximal stenosis with slow antegrade flow. Approximately 35% stenosis of right ICA proximal with a 5 mm ulcerated plaque. Echocardiogram with ejection fraction of 55% grade 1 diastolic dysfunction. EEG was negative. Carotid Doppler studies 02/17/2018 showing left ICA stenosis 80 -99%   Now requiring 24/7 Rehab RN,MD, as well as CIR level PT, OT and SLP.  Treatment team will focus on ADLs and mobility with goals set at [facility]  See Team Conference Notes for weekly updates to the plan of care

## 2018-02-20 NOTE — Progress Notes (Signed)
Admission done; granddaughter in room.

## 2018-02-20 NOTE — Progress Notes (Addendum)
[place] PHYSICAL MEDICINE & REHABILITATION     PROGRESS NOTE  Subjective/Complaints:  Patient seen lying in bed this morning. No reported issues overnight.  ROS: Limited due to language  Objective: Vital Signs: Blood pressure (!) 163/87, pulse 69, temperature 98.5 F (36.9 C), temperature source Oral, resp. rate 18, weight 57.8 kg (127 lb 6.8 oz), SpO2 94 %. No results found. Recent Labs    02/18/18 1912  WBC 7.6  HGB 12.6*  HCT 38.3*  PLT 250   Recent Labs    02/18/18 1912  CREATININE 1.08   CBG (last 3)  No results for input(s): GLUCAP in the last 72 hours.  Wt Readings from Last 3 Encounters:  02/18/18 57.8 kg (127 lb 6.8 oz)  02/13/18 57.2 kg (126 lb 1.7 oz)  02/11/18 63.5 kg (140 lb)    Physical Exam:  BP (!) 163/87 (BP Location: Left Arm)   Pulse 69   Temp 98.5 F (36.9 C) (Oral)   Resp 18   Wt 57.8 kg (127 lb 6.8 oz)   SpO2 94%   BMI 20.57 kg/m  Constitutional: He appearswell-developed.  Well-nourished.  NAD. HENT: Normocephalic.  Atraumatic. Eyes:EOMI.  No discharge. Cardiovascular:RRR.  No JVD. Respiratory:Effort normal.  Clear.  GI: He exhibitsno distension.  Bowel sounds normal. Musculoskeletal:No edema.  No tenderness. Neurological: Non-English-speaking [place] male.  ?aphasia vs language.  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  Spontaneously moving LUE/LLE Skin: warm and dry. Intact. Psychiatric:Unable to assess due to language  Assessment/Plan: 1. Functional deficits secondary to multifocal bilateral infarcts which require 3+ hours per day of interdisciplinary therapy in a comprehensive inpatient rehab setting. Physiatrist is providing close team supervision and 24 hour management of active medical problems listed below. Physiatrist and rehab team continue to assess barriers to discharge/monitor patient progress toward functional and medical goals.  Function:  Bathing Bathing position      Bathing  parts Body parts bathed by patient: Right arm, Chest, Abdomen Body parts bathed by helper: Back, Left arm  Bathing assist        Upper Body Dressing/Undressing Upper body dressing   What is the patient wearing?: Pull over shirt/dress     Pull over shirt/dress - Perfomed by patient: Thread/unthread left sleeve, Put head through opening Pull over shirt/dress - Perfomed by helper: Thread/unthread right sleeve, Pull shirt over trunk        Upper body assist        Lower Body Dressing/Undressing Lower body dressing   What is the patient wearing?: Pants, Non-skid slipper socks       Pants- Performed by helper: Pull pants up/down, Thread/unthread left pants leg, Thread/unthread right pants leg   Non-skid slipper socks- Performed by helper: Don/doff right sock, Don/doff left sock                  Lower body assist        Toileting Toileting          Toileting assist     Transfers Chair/bed transfer   Chair/bed transfer method: Squat pivot, Other(performed both squat pivot and stedy transfers during session) Chair/bed transfer assist level: Maximal assist (Pt 25 - 49%/lift and lower) Chair/bed transfer assistive device: Mechanical lift Mechanical lift: Stedy   Locomotion Ambulation Ambulation activity did not occur: Safety/medical concerns         Wheelchair   Type: Manual Max wheelchair distance: 44' Assist Level: Touching or steadying assistance (Pt > 75%)  Cognition Comprehension  Comprehension assist level: Understands basic less than 25% of the time/ requires cueing >75% of the time  Expression Expression assist level: Expresses basis less than 25% of the time/requires cueing >75% of the time.  Social Interaction Social Interaction assist level: Interacts appropriately less than 25% of the time. May be withdrawn or combative.  Problem Solving Problem solving assist level: Solves basic less than 25% of the time - needs direction nearly all the time or does  not effectively solve problems and may need a restraint for safety  Memory Memory assist level: Recognizes or recalls less than 25% of the time/requires cueing greater than 75% of the time    Medical Problem List and Plan: 1.Right-sided weakness and slurred speechsecondary to multifocal acute ischemia within the left hemisphere, predominantly within the deep watershed zone and left ACA/MCA cortical watershed zone. Small subacute infarct within the right side white matter adjacent to the corpus callosum splenium on 5/26  Continue CIR  2. DVT Prophylaxis/Anticoagulation: Subcutaneous heparin 3. Pain Management:Tylenol as needed 4. Mood:Provide emotional support 5. Neuropsych: This patientis? capable of making decisions on hisown behalf. 6. Skin/Wound Care:Routine skin checks 7. Fluids/Electrolytes/Nutrition:Routine in and outs  BMP within acceptable range on 5/29, labs pending Encourage PO 8.ICA stenosis.Follow up IR for possibleICA revascularization. Procedure based on timeframe of rehab services 9.Hyperlipidemia. Lipitor 10.  Hypertension  Lisinopril 2.5 started on 6/2 11. ABLA  Hb 12.6 on 5/31  Labs pending 12. Postoperative dysphagia  D1 nectar liquids  Advance diet as tolerated  LOS (Days) 2 A FACE TO FACE EVALUATION WAS PERFORMED  Shelbe Haglund Karis Jubanil Clarene Curran 02/20/2018 7:20 AM

## 2018-02-21 ENCOUNTER — Inpatient Hospital Stay (HOSPITAL_COMMUNITY): Payer: Medicare Other | Admitting: Occupational Therapy

## 2018-02-21 ENCOUNTER — Inpatient Hospital Stay (HOSPITAL_COMMUNITY): Payer: Medicare Other

## 2018-02-21 ENCOUNTER — Encounter (HOSPITAL_COMMUNITY): Payer: Self-pay

## 2018-02-21 ENCOUNTER — Other Ambulatory Visit: Payer: Self-pay

## 2018-02-21 ENCOUNTER — Inpatient Hospital Stay (HOSPITAL_COMMUNITY): Payer: Medicare Other | Admitting: Speech Pathology

## 2018-02-21 LAB — COMPREHENSIVE METABOLIC PANEL
ALT: 51 U/L (ref 17–63)
AST: 39 U/L (ref 15–41)
Albumin: 3.2 g/dL — ABNORMAL LOW (ref 3.5–5.0)
Alkaline Phosphatase: 82 U/L (ref 38–126)
Anion gap: 8 (ref 5–15)
BUN: 12 mg/dL (ref 6–20)
CO2: 28 mmol/L (ref 22–32)
Calcium: 9.4 mg/dL (ref 8.9–10.3)
Chloride: 103 mmol/L (ref 101–111)
Creatinine, Ser: 1.09 mg/dL (ref 0.61–1.24)
GFR calc Af Amer: >60 mL/min (ref >60)
GFR calc non Af Amer: >60 mL/min (ref >60)
Glucose, Bld: 107 mg/dL — ABNORMAL HIGH (ref 65–99)
Potassium: 3.9 mmol/L (ref 3.5–5.1)
Sodium: 139 mmol/L (ref 135–145)
Total Bilirubin: 0.7 mg/dL (ref 0.3–1.2)
Total Protein: 7 g/dL (ref 6.5–8.1)

## 2018-02-21 LAB — CBC WITH DIFFERENTIAL/PLATELET
Abs Immature Granulocytes: 0.1 10*3/uL (ref 0.0–0.1)
Basophils Absolute: 0.1 10*3/uL (ref 0.0–0.1)
Basophils Relative: 1 %
Eosinophils Absolute: 0.2 10*3/uL (ref 0.0–0.7)
Eosinophils Relative: 2 %
HCT: 40.7 % (ref 39.0–52.0)
Hemoglobin: 13.5 g/dL (ref 13.0–17.0)
Immature Granulocytes: 1 %
Lymphocytes Relative: 23 %
Lymphs Abs: 2 10*3/uL (ref 0.7–4.0)
MCH: 24.7 pg — ABNORMAL LOW (ref 26.0–34.0)
MCHC: 33.2 g/dL (ref 30.0–36.0)
MCV: 74.5 fL — ABNORMAL LOW (ref 78.0–100.0)
Monocytes Absolute: 0.7 10*3/uL (ref 0.1–1.0)
Monocytes Relative: 7 %
Neutro Abs: 5.9 10*3/uL (ref 1.7–7.7)
Neutrophils Relative %: 66 %
Platelets: 250 10*3/uL (ref 150–400)
RBC: 5.46 MIL/uL (ref 4.22–5.81)
RDW: 15.8 % — ABNORMAL HIGH (ref 11.5–15.5)
WBC: 8.8 10*3/uL (ref 4.0–10.5)

## 2018-02-21 NOTE — Progress Notes (Signed)
Social Work  Social Work Assessment and Plan  Patient Details  Name: Jorge Burns MRN: [id number] Date of Birth: 04/28/1941  Today's Date: 02/21/2018  Problem List:  Patient Active Problem List   Diagnosis Date Noted  . Dysphagia, post-stroke   . Acute blood loss anemia   . Essential hypertension   . Acute bilat watershed infarction [facility](HCC) 02/18/2018  . Aphasic disturbance   . Right hemiparesis (HCC)   . Pulmonary fungal infection   . Diastolic dysfunction   . Prediabetes   . CVA (cerebral vascular accident) (HCC) 02/13/2018  . Influenza B 11/29/2015  . History of CVA (cerebrovascular accident)   . Language barrier   . Benign essential HTN   . Syncope 11/28/2015   Past Medical History:  Past Medical History:  Diagnosis Date  . Hypertension   . Stroke [facility](HCC)    "a long time ago -- 2 years ago"   Past Surgical History:  Past Surgical History:  Procedure Laterality Date  . IR ANGIO INTRA EXTRACRAN SEL COM CAROTID INNOMINATE BILAT MOD SED  02/15/2018  . IR ANGIO VERTEBRAL SEL SUBCLAVIAN INNOMINATE UNI R MOD SED  02/15/2018  . IR ANGIO VERTEBRAL SEL VERTEBRAL UNI L MOD SED  02/15/2018   Social History:  reports that he has never smoked. He has never used smokeless tobacco. He reports that he does not drink alcohol or use drugs.  Family / Support Systems Marital Status: Single Patient Roles: Other (Comment), Volunteer(employee) Other Supports: [name] Colclough-grandaughter [phone]-cell Anticipated Caregiver: unsure at this time Ability/Limitations of Caregiver: [name] is in class M-T 8:30-1:00 and then cares for her 8 and 2yo children. Feels too much to look after her children and pt Caregiver Availability: Other (Comment)(granddaughter does not feel she can do 24 hr physical care) Family Dynamics: Pt has his granddaughter and that is the only family he has locally. She and her husband have two children-2 yo and 8yo. [name] goes to school in the am. [name] husband works during the day.  Has a few  friends and people he worked with.  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 His granddaughter hopes he does well here, he needs to improve. He has always taken care of himself and wants to again but this stroke is much worse than then one he had in 2017. He has no deficits after that one Recent Psychosocial Issues: health issues was not seeing a MD Pyschiatric History: No history deferred depression screen due to working hard in therapies, he would benefit from seeing neuro-psych while here for coping and concerns he has. Substance Abuse History: No issues  Patient / Family Perceptions, Expectations & Goals Pt/Family understanding of illness & functional limitations: Pt and grandaughter can explain his stroke but both are hopeful he will do well and recover from this as he has done in the past. They do talk with the MD and therapy team. Will make sure interpreter  is here when MD rounding so pt can ask his questions. Premorbid pt/family roles/activities: grandfather, employee, friend, etc Anticipated changes in roles/activities/participation: resume Pt/family expectations/goals: Pt states: " Want to do good."  Granddaughter states: " I hope he recovers I can't do a  lot for him."  [employer]: None Premorbid Home Care/DME Agencies: None Transportation available at discharge: [employer] referrals recommended: Neuropsychology, Support group (specify)  Discharge Planning Living Arrangements: Other relatives Support Systems: Other relatives, Manufacturing engineer, Psychologist, clinical community Type of Residence: Private residence Insurance Resources: Electrical engineer Resources: Employment Financial Screen Referred: No Living Expenses: Lives with family Money Management: Family Does the patient have any problems obtaining your medications?: No(was not seeing a MD prior to admission) Home Management: Granddaughter Patient/Family Preliminary Plans: Unsure at this time depends upon if granddaughter can provide the care he will need. She feels she can not provide much physical care with her responsibilities with her own children and their care. Her husband works and will be there at night. Will discuss with team goals and progress here and work on the best plan for all involved. Sw Barriers to Discharge: Decreased caregiver support Sw Barriers to Discharge Comments: granddaughter unsure if can do any physical care Social Work Anticipated Follow Up Needs: HH/OP, SNF, Support Group  Clinical Impression Pleasant gentleman who has had a severe CVA. He is working in therapies but will require 24 hr care at discharge. He was living with his granddaughter and her family but she is not sure how much care she can provide along with caring for her own children. The language barrier would be a limiting factor if he becomes NHP so will try to get pt back home if possible.   Lucy Chris 02/21/2018, 3:52 PM

## 2018-02-21 NOTE — Progress Notes (Signed)
Physical Therapy Session Note  Patient Details  Name: Jorge Burns MRN: [id number] Date of Birth: 02/14/1941  Today's Date: 02/21/2018 PT Individual Time: 1000-1058 PT Individual Time Calculation (min): 58 min   Short Term Goals: Week 1:  PT Short Term Goal 1 (Week 1): Pt will initiate gait training PT Short Term Goal 2 (Week 1): Pt will transfer bed<>chair w/ mod assist PT Short Term Goal 3 (Week 1): Pt will tolerate 30 min of OOB activity w/o increase in fatigue PT Short Term Goal 4 (Week 1): Pt will maintain static standing balance w/ min assist  PT Short Term Goal 5 (Week 1): Pt will follow verbal 1-step commands 50% of the time  Skilled Therapeutic Interventions/Progress Updates:    Pt seated in w/c upon PT arrival,agreeable to therapy tx and denies pain. Pt transported to the gym. Pt performed x 4 sit<>stands throughout this session with mod to max assist, using parallel bars for L UE support. In standing pt worked on standing balance without UE support, increased posterior lean, therapist blocking R knee, max assist, x 2 trials. In standing pt worked on lifting L LE in place while therapist provided total assist to block R his R knee, x 2 trials. Pt transported to ortho gym. Pt performed squat pivot transfer from w/c>mat with max assist, verbal cues for initiation and manual facilitation for weightshift. Pt seated edge of mat worked on seated balance and attention to R side in order to perform reaching task, hand over hand assist to bring L hand to cups on R side. Pt transferred from mat>w/c to the R total assist, pt unable to attend to R side to find w/c. Pt worked on w/c propulsion back to room with min assist, max cues for techniques and attention to R side. Pt performed squat pivot transfer back to bed with max assist using bedrail, left supine in bed with needs in reach.   Therapy Documentation Precautions:  Precautions Precautions: Fall Precaution Comments: non-English  speaking Restrictions Weight Bearing Restrictions: No  See Function Navigator for Current Functional Status.   Therapy/Group: Individual Therapy  Cresenciano GenreEmily van Schagen, PT, DPT 02/21/2018, 7:54 AM

## 2018-02-21 NOTE — Care Management Note (Signed)
Inpatient Rehabilitation Center Individual Statement of Services  Patient Name:  Jorge Burns  Date:  02/21/2018  Welcome to the Inpatient Rehabilitation Center.  Our goal is to provide you with an individualized program based on your diagnosis and situation, designed to meet your specific needs.  With this comprehensive rehabilitation program, you will be expected to participate in at least 3 hours of rehabilitation therapies Monday-Friday, with modified therapy programming on the weekends.  Your rehabilitation program will include the following services:  Physical Therapy (PT), Occupational Therapy (OT), Speech Therapy (ST), 24 hour per day rehabilitation nursing, Neuropsychology, Case Management (Social Worker), Rehabilitation Medicine, Nutrition Services and Pharmacy Services  Weekly team conferences will be held on Wednesday to discuss your progress.  Your Social Worker will talk with you frequently to get your input and to update you on team discussions.  Team conferences with you and your family in attendance may also be held.  Expected length of stay: 21-24 days  Overall anticipated outcome: min-mod level of care  Depending on your progress and recovery, your program may change. Your Social Worker will coordinate services and will keep you informed of any changes. Your Social Worker's name and contact numbers are listed  below.  The following services may also be recommended but are not provided by the Inpatient Rehabilitation Center:    Home Health Rehabiltiation Services  Outpatient Rehabilitation Services  Vocational Rehabilitation   Arrangements will be made to provide these services after discharge if needed.  Arrangements include referral to agencies that provide these services.  Your insurance has been verified to be:  Medicare Your primary doctor is:  None  Pertinent information will be shared with your doctor and your insurance company.  Social Worker:  Jorge Burns, Jorge Burns  [phone] or ([phone]) [phone]  Information discussed with and copy given to patient by: Jorge Burns, Jorge Burns, 02/21/2018, 3:55 PM

## 2018-02-21 NOTE — Progress Notes (Signed)
[facility] PHYSICAL MEDICINE & REHABILITATION     PROGRESS NOTE  Subjective/Complaints:   No vocalization, ate>50% meal with sup from NT  ROS: Limited due to language  Objective: Vital Signs: Blood pressure (!) 143/78, pulse 80, temperature 97.8 F (36.6 C), resp. rate 18, weight 57.8 kg (127 lb 6.8 oz), SpO2 94 %. No results found. Recent Labs    02/18/18 1912  WBC 7.6  HGB 12.6*  HCT 38.3*  PLT 250   Recent Labs    02/18/18 1912  CREATININE 1.08   CBG (last 3)  No results for input(s): GLUCAP in the last 72 hours.  Wt Readings from Last 3 Encounters:  02/18/18 57.8 kg (127 lb 6.8 oz)  02/13/18 57.2 kg (126 lb 1.7 oz)  02/11/18 63.5 kg (140 lb)    Physical Exam:  BP (!) 143/78 (BP Location: Left Arm)   Pulse 80   Temp 97.8 F (36.6 C)   Resp 18   Wt 57.8 kg (127 lb 6.8 oz)   SpO2 94%   BMI 20.57 kg/m  Constitutional: He appearswell-developed.  Well-nourished.  NAD. HENT: Normocephalic.  Atraumatic. Eyes:EOMI.  No discharge. Cardiovascular:RRR.  No JVD. Respiratory:Effort normal.  Clear.  GI: He exhibitsno distension.  Bowel sounds normal. Musculoskeletal:No edema.  No tenderness. Neurological: Non-English-speaking [place] male.  ?aphasia vs language.  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  Spontaneously moving LUE/LLE Skin: warm and dry. Intact. Psychiatric:Unable to assess due to language  Assessment/Plan: 1. Functional deficits secondary to multifocal bilateral infarcts which require 3+ hours per day of interdisciplinary therapy in a comprehensive inpatient rehab setting. Physiatrist is providing close team supervision and 24 hour management of active medical problems listed below. Physiatrist and rehab team continue to assess barriers to discharge/monitor patient progress toward functional and medical goals.  Function:  Bathing Bathing position      Bathing parts Body parts bathed by patient: Right arm,  Chest, Abdomen Body parts bathed by helper: Back, Left arm  Bathing assist        Upper Body Dressing/Undressing Upper body dressing   What is the patient wearing?: Pull over shirt/dress     Pull over shirt/dress - Perfomed by patient: Thread/unthread left sleeve, Put head through opening Pull over shirt/dress - Perfomed by helper: Thread/unthread right sleeve, Pull shirt over trunk        Upper body assist        Lower Body Dressing/Undressing Lower body dressing   What is the patient wearing?: Pants, Non-skid slipper socks       Pants- Performed by helper: Pull pants up/down, Thread/unthread left pants leg, Thread/unthread right pants leg   Non-skid slipper socks- Performed by helper: Don/doff right sock, Don/doff left sock                  Lower body assist        Toileting Toileting Toileting activity did not occur: No continent bowel/bladder event        Toileting assist     Transfers Chair/bed transfer   Chair/bed transfer method: Squat pivot Chair/bed transfer assist level: Maximal assist (Pt 25 - 49%/lift and lower) Chair/bed transfer assistive device: Mechanical lift Mechanical lift: Stedy   Locomotion Ambulation Ambulation activity did not occur: Safety/medical concerns         Wheelchair   Type: Manual Max wheelchair distance: 60' Assist Level: Touching or steadying assistance (Pt > 75%)  Cognition Comprehension Comprehension assist level: Understands basic less than 25%  of the time/ requires cueing >75% of the time  Expression Expression assist level: Expresses basis less than 25% of the time/requires cueing >75% of the time.  Social Interaction Social Interaction assist level: Interacts appropriately less than 25% of the time. May be withdrawn or combative.  Problem Solving Problem solving assist level: Solves basic less than 25% of the time - needs direction nearly all the time or does not effectively solve problems and may need a  restraint for safety  Memory Memory assist level: Recognizes or recalls 25 - 49% of the time/requires cueing 50 - 75% of the time    Medical Problem List and Plan: 1.Right-sided weakness and slurred speechsecondary to multifocal acute ischemia within the left hemisphere, predominantly within the deep watershed zone and left ACA/MCA cortical watershed zone. Small subacute infarct within the right side white matter adjacent to the corpus callosum splenium on 5/26 On ASA/Plavix  Continue CIR PT, OT, SLP 2. DVT Prophylaxis/Anticoagulation: Subcutaneous heparin 3. Pain Management:Tylenol as needed 4. Mood:Provide emotional support 5. Neuropsych: This patientis? capable of making decisions on hisown behalf. 6. Skin/Wound Care:Routine skin checks 7. Fluids/Electrolytes/Nutrition:Routine in and outs  BMP within acceptable range on 5/29, labs pending Encourage PO 8.ICA stenosis.Follow up IR for possibleICA revascularization. Procedure based on timeframe of rehab services 9.Hyperlipidemia. Lipitor 10.  Hypertension  Lisinopril 2.5 started on 6/2 Vitals:   02/20/18 1937 02/21/18 0516  BP: 127/71 (!) 143/78  Pulse: 79 80  Resp: 18 18  Temp: 98.5 F (36.9 C) 97.8 F (36.6 C)  SpO2: 95% 94%  Monitor response  11. ABLA  Hb 12.6 on 5/31  Recheck 6/3 12. Postoperative dysphagia  D1 nectar liquids  Advance diet as tolerated  LOS (Days) 3 A FACE TO FACE EVALUATION WAS PERFORMED  Erick Colacendrew E Eriel Doyon 02/21/2018 7:45 AM

## 2018-02-21 NOTE — Progress Notes (Signed)
Occupational Therapy Session Note  Patient Details  Name: Jorge Burns MRN: [id number] Date of Birth: Nov 03, 1940  Today's Date: 02/21/2018 OT Individual Time: 1330-1400 OT Individual Time Calculation (min): 30 min   Short Term Goals: Week 1:  OT Short Term Goal 1 (Week 1): Pt will sit EOM for 5 min with supervision during functional task OT Short Term Goal 2 (Week 1): Pt will transfer to [facility] with MOD A of 1 and LRAD OT Short Term Goal 3 (Week 1): Pt will sit to stand with MIN A for balance in prep for clothing managmnet OT Short Term Goal 4 (Week 1): Pt will locate 2 items on R of sink wiht min VC  Skilled Therapeutic Interventions/Progress Updates:    Pt greeted semi-reclined in bed with interpreter present. Donned shorts with OT threading B LEs, then pt initiated trying to bridge L hip to pull pants up, but needed max A to pull them up completely.  Pt came to sitting EOB with max A. Squat-pivot to wc with max A and multimodal cues to initiate power up + facilitation for head/hips relationship. Pt brought to day room and worked on [place] with weight bearing towel pushes. Pt would not respond to questions but followed 25-50% of commands. Pt returned to room and left with safety belt on and needs met.   Therapy Documentation Precautions:  Precautions Precautions: Fall Precaution Comments: non-English speaking Restrictions Weight Bearing Restrictions: No Pain: Pain Assessment Pain Scale: 0-10 Pain Score: 0-No pain  See Function Navigator for Current Functional Status.  Therapy/Group: Individual Therapy  Valma Cava 02/21/2018, 2:01 PM

## 2018-02-21 NOTE — Progress Notes (Signed)
Speech Language Pathology Daily Session Note  Patient Details  Name: Alfonse RasHuoi Messerschmidt MRN: [id number] Date of Birth: 08/02/1941  Today's Date: 02/21/2018 SLP Individual Time: 1400-1450 SLP Individual Time Calculation (min): 50 min  Missed Time: 10 mins, fatigue   Short Term Goals: Week 1: SLP Short Term Goal 1 (Week 1): Pt will initiate functional self-care activities with 50% accuracy given Max A multimodal cues.  SLP Short Term Goal 2 (Week 1): Pt will convey wants/needs with nonverbal communication ( gestures, facial expressions) with Max A multimodal cues.  SLP Short Term Goal 3 (Week 1): Pt will follow 1 step functional directions related to self with 50% accuracy and Max A multimodal cues SLP Short Term Goal 4 (Week 1): Pt will vocalize VC approximations in 50% of opportunties with Max A multimodal cues.  SLP Short Term Goal 5 (Week 1): Pt will consume ice chips/thin via TSP trials with overt s/s aspiration to demonstrate readiness for instrumental swallow assessment. SLP Short Term Goal 6 (Week 1): Pt will demonstrate efficient mastication, increased control of dys 2 trials and min overt s/s aspiration as readiness for diet advancement,  Skilled Therapeutic Interventions: Skilled treatment session focused on communication goals. SLP facilitated session by providing Max-Total A multimodal cues to identify functional items from a field of 2 with 40% accuracy. Patient unable to vocalize on command despite Max A multimodal cues but did smile intermittently throughout session. Patient performed oral care via the suction toothbrush with Max A multimodal cues needed for thoroughness. Patient closing his eyes while upright in wheelchair and was transferred back to bed with total +2 assist. Therefore, patient missed 10 minutes of skilled SLP intervention. Patient left supine in bed with all needs within reach and alarm on. Continue with current plan of care.      Function:  Cognition Comprehension  Comprehension assist level: Understands basic less than 25% of the time/ requires cueing >75% of the time  Expression   Expression assist level: Expresses basis less than 25% of the time/requires cueing >75% of the time.  Social Interaction Social Interaction assist level: Interacts appropriately 25 - 49% of time - Needs frequent redirection.  Problem Solving Problem solving assist level: Solves basic less than 25% of the time - needs direction nearly all the time or does not effectively solve problems and may need a restraint for safety  Memory Memory assist level: Recognizes or recalls less than 25% of the time/requires cueing greater than 75% of the time    Pain Pain Assessment Pain Scale: 0-10 Pain Score: 0-No pain  Therapy/Group: Individual Therapy  Visente Kirker 02/21/2018, 2:54 PM

## 2018-02-21 NOTE — Progress Notes (Signed)
Occupational Therapy Session Note  Patient Details  Name: Jorge Burns MRN: [id number] Date of Birth: 1940-12-11  Today's Date: 02/21/2018 OT Individual Time: 0915-1000 OT Individual Time Calculation (min): 45 min  and Today's Date: 02/21/2018 OT Missed Time: 15 Minutes Missed Time Reason: Other (comment)(OT in inservice )   Short Term Goals: Week 1:  OT Short Term Goal 1 (Week 1): Pt will sit EOM for 5 min with supervision during functional task OT Short Term Goal 2 (Week 1): Pt will transfer to [facility] with MOD A of 1 and LRAD OT Short Term Goal 3 (Week 1): Pt will sit to stand with MIN A for balance in prep for clothing managmnet OT Short Term Goal 4 (Week 1): Pt will locate 2 items on R of sink wiht min VC  Skilled Therapeutic Interventions/Progress Updates:    Pt received supine in bed with interpreter present. Session focused on bed mobility, self care retraining, and R attention. Multimodal cueing provided during bed mobility to allow pt to initiate task, with max A overall. Pt able to inconsistently follow 1 step commands when familiar. Pt transferred to EOB with mod A, with vc/manual facilitation provided for UE placement and body mechanics. Pt sat EOB with static balance fluctuating between max A and (S), with manual facilitation required for positioning R UE/LE d/t flaccidity. Tactile cues/demonstation cues provided for bathing initiation, with pt requiring HOH to initiate any bathing this session. Pt then completed stand pivot transfer to w/c with mod A toward L side. Pt brought to sink to perform grooming/oral hygiene tasks. Pt able to correctly identify 1/3 familiar self-care objects by demo use, (toothbrush, no comb or deodorant). Pt required HOH to initiate combing hair and was unable to sequence use, requiring max A. Pt was left in w/c with chair alarm set and all needs met.   Therapy Documentation Precautions:  Precautions Precautions: Fall Precaution Comments: non-English  speaking Restrictions Weight Bearing Restrictions: No General: General OT Amount of Missed Time: 15 Minutes Pain: Pain Assessment Pain Scale: 0-10 Pain Score: 0-No pain  See Function Navigator for Current Functional Status.   Therapy/Group: Individual Therapy  Curtis Sites 02/21/2018, 12:24 PM

## 2018-02-21 NOTE — Progress Notes (Signed)
Slept good. Incontinent of urine and stool X2. Follows simple commands. No initiation.  Drank 120cc's of nectar water [facility] HS[facility] but patient still non-verbal. Jorge Burns, Jorge Skalicky A, RN

## 2018-02-22 ENCOUNTER — Inpatient Hospital Stay (HOSPITAL_COMMUNITY): Payer: Medicare Other | Admitting: Speech Pathology

## 2018-02-22 ENCOUNTER — Inpatient Hospital Stay (HOSPITAL_COMMUNITY): Payer: Medicare Other

## 2018-02-22 ENCOUNTER — Inpatient Hospital Stay (HOSPITAL_COMMUNITY): Payer: Medicare Other | Admitting: Occupational Therapy

## 2018-02-22 ENCOUNTER — Inpatient Hospital Stay (HOSPITAL_COMMUNITY): Payer: Medicare Other | Admitting: Physical Therapy

## 2018-02-22 NOTE — Progress Notes (Signed)
Occupational Therapy Session Note  Patient Details  Name: Jorge Burns MRN: [id number] Date of Birth: 11/03/1940  Today's Date: 02/22/2018 OT Individual Time: 0903-1000 OT Individual Time Calculation (min): 57 min    Short Term Goals: Week 1:  OT Short Term Goal 1 (Week 1): Pt will sit EOM for 5 min with supervision during functional task OT Short Term Goal 2 (Week 1): [facility] with MOD A of 1 and LRAD OT Short Term Goal 3 (Week 1): Pt will sit to stand with MIN A for balance in prep for clothing managmnet OT Short Term Goal 4 (Week 1): Pt will locate 2 items on R of sink wiht min VC  Skilled Therapeutic Interventions/Progress Updates:    Pt greeted semi-reclined in bed with interpreter running a little late. Able to gesture to pt and provide visual cues to get him to initiate sitting EOB with max A. 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 Interpreter entered room, but pt still had difficulty following his commands on body position. Sit<>stand in [place] with pt eventually able to achieve midline sitting balance w/o pushing with max multimodal cues. Stedy used to transfer in and out of shower. Bathing completed with focus on initiating functional task, R attention, and R NMR. Hand over hand A for neuro re-ed to R UE within bathing tasks. Max multimodal cues to get pt to wash parts of R side, but still needed mod/max A for thoroughness and supervision/min A for sitting balance. Dressing completed wc level at the sink with pt unable to follow commands for hemi-dressing techniques or attending to R side. Sit<>stands x 3 with max A to initiate and unable to achieve full hip/trunk extension. Provided pt with 1/2 lap tray for R UE and left with chair alarm, safety belt, and interpreter present.   Therapy Documentation Precautions:  Precautions Precautions: Fall Precaution Comments: non-English  speaking Restrictions Weight Bearing Restrictions: No Pain: Pain Assessment Pain Scale: Faces Faces Pain Scale: No hurt  See Function Navigator for Current Functional Status.   Therapy/Group: Individual Therapy  Jorge Burns 02/22/2018, 10:01 AM

## 2018-02-22 NOTE — Progress Notes (Signed)
Speech Language Pathology Daily Session Note  Patient Details  Name: Jorge Burns MRN: [id number] Date of Birth: 10/22/1940  Today's Date: 02/22/2018 SLP Individual Time: 1300-1345 SLP Individual Time Calculation (min): 45 min  Short Term Goals: Week 1: SLP Short Term Goal 1 (Week 1): Pt will initiate functional self-care activities with 50% accuracy given Max A multimodal cues.  SLP Short Term Goal 2 (Week 1): Pt will convey wants/needs with nonverbal communication ( gestures, facial expressions) with Max A multimodal cues.  SLP Short Term Goal 3 (Week 1): Pt will follow 1 step functional directions related to self with 50% accuracy and Max A multimodal cues SLP Short Term Goal 4 (Week 1): Pt will vocalize VC approximations in 50% of opportunties with Max A multimodal cues.  SLP Short Term Goal 5 (Week 1): Pt will consume ice chips/thin via TSP trials with overt s/s aspiration to demonstrate readiness for instrumental swallow assessment. SLP Short Term Goal 6 (Week 1): Pt will demonstrate efficient mastication, increased control of dys 2 trials and min overt s/s aspiration as readiness for diet advancement,  Skilled Therapeutic Interventions: Skilled treatment session focused on dysphagia and communication goals. SLP facilitated session by providing Max A multimodal cues for problem solving with oral care via the suction toothbrush. Patient consumed trials of ice chips with decreased labial seal and what appeared to be a delayed swallow initiation. 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 Recommend continued trials with SLP only. Patient unable to identify functional items from a field of 2 despite Max-Total A multimodal cues but could sort colors from a field of 2 with extra time and overall Mod A multimodal cues. Patient  handed off to PT. Continue with current plan of care.      Function:  Eating Eating   Modified Consistency Diet: Yes(with trials from SLP ) Eating Assist Level: Supervision/Verbal cueing   Eating Set Up Assist For: Opening containers       Cognition Comprehension Comprehension assist level: Understands basic less than 25% of the time/ requires cueing >75% of the time  Expression   Expression assist level: Expresses basis less than 25% of the time/requires cueing >75% of the time.  Social Interaction Social Interaction assist level: Interacts appropriately 25 - 49% of time - Needs frequent redirection.  Problem Solving Problem solving assist level: Solves basic less than 25% of the time - needs direction nearly all the time or does not effectively solve problems and may need a restraint for safety  Memory Memory assist level: Recognizes or recalls less than 25% of the time/requires cueing greater than 75% of the time    Pain No/Denies Pain   Therapy/Group: Individual Therapy  Olivea Sonnen 02/22/2018, 2:32 PM

## 2018-02-22 NOTE — Progress Notes (Signed)
Jorge Burns PHYSICAL MEDICINE & REHABILITATION     PROGRESS NOTE  Subjective/Complaints:   Resting comfortably in bed, follows gestural cues for exam  ROS: Limited due to language  Objective: Vital Signs: Blood pressure (!) 124/91, pulse 77, temperature 97.9 F (36.6 C), temperature source Oral, resp. rate 18, weight 57.8 kg (127 lb 6.8 oz), SpO2 94 %. No results found. Recent Labs    02/21/18 0720  WBC 8.8  HGB 13.5  HCT 40.7  PLT 250   Recent Labs    02/21/18 0720  NA 139  K 3.9  CL 103  GLUCOSE 107*  BUN 12  CREATININE 1.09  CALCIUM 9.4   CBG (last 3)  No results for input(s): GLUCAP in the last 72 hours.  Wt Readings from Last 3 Encounters:  02/18/18 57.8 kg (127 lb 6.8 oz)  02/13/18 57.2 kg (126 lb 1.7 oz)  02/11/18 63.5 kg (140 lb)    Physical Exam:  BP (!) 124/91 (BP Location: Left Arm)   Pulse 77   Temp 97.9 F (36.6 C) (Oral)   Resp 18   Wt 57.8 kg (127 lb 6.8 oz)   SpO2 94%   BMI 20.57 kg/m  Constitutional: He appearswell-developed.  Well-nourished.  NAD. HENT: Normocephalic.  Atraumatic. Eyes:EOMI.  No discharge. Cardiovascular:RRR.  No JVD. Respiratory:Effort normal.  Clear.  GI: He exhibitsno distension.  Bowel sounds normal. Musculoskeletal:No edema.  No tenderness. Neurological: Non-English-speaking [place] male.  ?aphasia + language.  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  Spontaneously moving LUE/LLE but difficulty with MMT even with gestural cues Skin: warm and dry. Intact. Psychiatric:Unable to assess due to language  Assessment/Plan: 1. Functional deficits secondary to multifocal bilateral infarcts which require 3+ hours per day of interdisciplinary therapy in a comprehensive inpatient rehab setting. Physiatrist is providing close team supervision and 24 hour management of active medical problems listed below. Physiatrist and rehab team continue to assess barriers to discharge/monitor patient  progress toward functional and medical goals.  Function:  Bathing Bathing position   Position: Sitting EOB  Bathing parts Body parts bathed by patient: Right arm, Chest, Abdomen, Front perineal area Body parts bathed by helper: Back, Left arm, Right upper leg, Left upper leg, Right lower leg, Left lower leg  Bathing assist Assist Level: Touching or steadying assistance(Pt > 75%)      Upper Body Dressing/Undressing Upper body dressing   What is the patient wearing?: Hospital gown     Pull over shirt/dress - Perfomed by patient: Thread/unthread left sleeve Pull over shirt/dress - Perfomed by helper: Thread/unthread right sleeve        Upper body assist Assist Level: Touching or steadying assistance(Pt > 75%)      Lower Body Dressing/Undressing Lower body dressing   What is the patient wearing?: Pants   Underwear - Performed by helper: Thread/unthread right underwear leg, Thread/unthread left underwear leg, Pull underwear up/down   Pants- Performed by helper: Thread/unthread right pants leg, Thread/unthread left pants leg, Pull pants up/down   Non-skid slipper socks- Performed by helper: Don/doff right sock, Don/doff left sock                  Lower body assist Assist for lower body dressing: (Max A)      Toileting Toileting Toileting activity did not occur: No continent bowel/bladder event   Toileting steps completed by helper: Adjust clothing prior to toileting, Performs perineal hygiene, Adjust clothing after toileting(per Felecia, NT report)    Toileting  assist     Transfers Chair/bed transfer   Chair/bed transfer method: Squat pivot Chair/bed transfer assist level: Maximal assist (Pt 25 - 49%/lift and lower) Chair/bed transfer assistive device: Mechanical lift Mechanical lift: Stedy   Locomotion Ambulation Ambulation activity did not occur: Safety/medical concerns         Wheelchair   Type: Manual Max wheelchair distance: 150 ft Assist Level:  Touching or steadying assistance (Pt > 75%)  Cognition Comprehension Comprehension assist level: Understands basic less than 25% of the time/ requires cueing >75% of the time  Expression Expression assist level: Expresses basis less than 25% of the time/requires cueing >75% of the time.  Social Interaction Social Interaction assist level: Interacts appropriately 25 - 49% of time - Needs frequent redirection.  Problem Solving Problem solving assist level: Solves basic less than 25% of the time - needs direction nearly all the time or does not effectively solve problems and may need a restraint for safety  Memory Memory assist level: Recognizes or recalls less than 25% of the time/requires cueing greater than 75% of the time    Medical Problem List and Plan: 1.Right-sided weakness and slurred speechsecondary to multifocal acute ischemia within the left hemisphere, predominantly within the deep watershed zone and left ACA/MCA cortical watershed zone. Small subacute infarct within the right side white matter adjacent to the corpus callosum splenium on 5/26 On ASA/Plavix  Continue CIR PT, OT, SLP 2. DVT Prophylaxis/Anticoagulation: Subcutaneous heparin 3. Pain Management:Tylenol as needed 4. Mood:Provide emotional support 5. Neuropsych: This patientis? capable of making decisions on hisown behalf. 6. Skin/Wound Care:Routine skin checks 7. Fluids/Electrolytes/Nutrition:Routine in and outs  BMP within acceptable range on 5/29, labs pending Encourage PO 8.ICA stenosis.Follow up IR for possibleICA revascularization. Procedure based on timeframe of rehab services 9.Hyperlipidemia. Lipitor 10.  Hypertension  Lisinopril 2.5 started on 6/4 Vitals:   02/21/18 2013 02/22/18 0503  BP: 131/69 (!) 124/91  Pulse: 73 77  Resp: 18 18  Temp: 98.1 F (36.7 C) 97.9 F (36.6 C)  SpO2: 96% 94%  good control  11. anemia-resolved  Hb 12.6 on 5/31  Recheck 6/3 13.5 12.  Postoperative dysphagia  D1 nectar liquids- I 600ml yesterday  Advance diet as tolerated  LOS (Days) 4 A FACE TO FACE EVALUATION WAS PERFORMED  Erick Colacendrew E Kirsteins 02/22/2018 7:46 AM

## 2018-02-22 NOTE — Progress Notes (Signed)
Physical Therapy Session Note  Patient Details  Name: Jorge Burns MRN: [id number] Date of Birth: [birth year]  Today's Date: 02/22/2018 PT Individual Time: 1000-1055 PT Individual Time Calculation (min): 55 min   Short Term Goals: Week 1:  PT Short Term Goal 1 (Week 1): Pt will initiate gait training PT Short Term Goal 2 (Week 1): Pt will transfer bed<>chair w/ mod assist PT Short Term Goal 3 (Week 1): Pt will tolerate 30 min of OOB activity w/o increase in fatigue PT Short Term Goal 4 (Week 1): Pt will maintain static standing balance w/ min assist  PT Short Term Goal 5 (Week 1): Pt will follow verbal 1-step commands 50% of the time  Skilled Therapeutic Interventions/Progress Updates:   Pt in w/c and agreeable to therapy, no c/o or evidence of pain. Interpreter present throughout session, however participating minimally despite requests. Pt seems only responsive to visual gestures. Total assist w/c transport to therapy gym. Worked on /c management and mobility this session. Pt's in w/c that has too long of footrests and cannot be adjusted. Switched to new w/c and therapist adjusted new w/c to hemi-height 2/2 pt's height. Transferred to new chair via squat pivot w/ max assist, manual and tactile cues for initiation and technique. Worked on pt self-propelling w/c via L hemi technique. Max verbal, visual, and tactile cues for technique at first, however fading to min assist towards end of session. Able to go 150' in last bout. Returned to room and transferred to EOB w/ max assist. Ended session in supine, call bell within reach and all needs met.   Therapy Documentation Precautions:  Precautions Precautions: Fall Precaution Comments: non-English speaking Restrictions Weight Bearing Restrictions: No Vital Signs: Therapy Vitals BP: 112/64 Pain: Pain Assessment Pain Scale: Faces Faces Pain Scale: No hurt  See Function Navigator for Current Functional Status.   Therapy/Group: Individual  Therapy  Aldeen Riga K Arnette 02/22/2018, 10:56 AM

## 2018-02-22 NOTE — Progress Notes (Signed)
Physical Therapy Session Note  Patient Details  Name: Jorge Burns MRN: [id number] Date of Birth: 03/14/1941  Today's Date: 02/22/2018 PT Individual Time: 1345-1415 PT Individual Time Calculation (min): 30 min   Short Term Goals: Week 1:  PT Short Term Goal 1 (Week 1): Pt will initiate gait training PT Short Term Goal 2 (Week 1): Pt will transfer bed<>chair w/ mod assist PT Short Term Goal 3 (Week 1): Pt will tolerate 30 min of OOB activity w/o increase in fatigue PT Short Term Goal 4 (Week 1): Pt will maintain static standing balance w/ min assist  PT Short Term Goal 5 (Week 1): Pt will follow verbal 1-step commands 50% of the time  Skilled Therapeutic Interventions/Progress Updates:   Handoff from SLP. Focused on NMR for postural control re-training, reorientation to midline, and motor control in standing using Standing frame (with slack in sling as pt able to progress to more upright posture) with facilitation for trunk extension, weightshifting to the L (especially while reaching forward and to the L), and attention to the RUE placement. W/c propulsion back to room with supervision to min assist and max verbal cues for technique and awareness to obstacles on R. Max assist sit <> stand x 2 reps to place chair alarm in w/c with facilitation for anterior weightshift and power up.   Therapy Documentation Precautions:  Precautions Precautions: Fall Precaution Comments: non-English speaking Restrictions Weight Bearing Restrictions: No   Pain: Does not report pain or appear to have discomfort  See Function Navigator for Current Functional Status.   Therapy/Group: Individual Therapy  Karolee StampsGray, Amry Cathy Darrol PokeBrescia  Gaspard Isbell B. Jelan Batterton, PT, DPT  02/22/2018, 2:19 PM

## 2018-02-23 ENCOUNTER — Inpatient Hospital Stay (HOSPITAL_COMMUNITY): Payer: Medicare Other

## 2018-02-23 ENCOUNTER — Encounter (HOSPITAL_COMMUNITY): Payer: Self-pay

## 2018-02-23 ENCOUNTER — Inpatient Hospital Stay (HOSPITAL_COMMUNITY): Payer: Medicare Other | Admitting: Physical Therapy

## 2018-02-23 ENCOUNTER — Inpatient Hospital Stay (HOSPITAL_COMMUNITY): Payer: Medicare Other | Admitting: Speech Pathology

## 2018-02-23 ENCOUNTER — Inpatient Hospital Stay (HOSPITAL_COMMUNITY): Payer: Medicare Other | Admitting: Occupational Therapy

## 2018-02-23 MED ORDER — METHYLPHENIDATE HCL 5 MG PO TABS
5.0000 mg | ORAL_TABLET | Freq: Two times a day (BID) | ORAL | Status: DC
Start: 2018-02-23 — End: 2018-03-07
  Administered 2018-02-23 – 2018-03-07 (×25): 5 mg via ORAL
  Filled 2018-02-23 (×25): qty 1

## 2018-02-23 NOTE — Progress Notes (Signed)
Social Work Patient ID: Engineer, manufacturing systemsHuoi Burns, male   DOB: 05/04/1941, 77 y.o.   MRN: [id number]  Spoke with granddaughter via telephone to discuss team conference goals min assist level and target discharge date 6/21. She had questions about if they were going to do the surgery (CEA) next week the MD had talked about it. Will ask MD about this. She voiced she feels pt is depressed and frustrated since she feels he understands what is said to him but can not express himself. Discussed main goal is to get him home and not to a NH due the the language barrier and he may become more depressed. She wants to take him home but be able to manage him along with her children. Will continue to work on the best discharge plan for all of them.

## 2018-02-23 NOTE — Progress Notes (Signed)
Physical Therapy Session Note  Patient Details  Name: Jorge Burns MRN: [id number] Date of Birth: 10/15/1940  Today's Date: 02/23/2018 PT Individual Time: 1300-1330 PT Individual Time Calculation (min): 30 min   Short Term Goals: Week 1:  PT Short Term Goal 1 (Week 1): Pt will initiate gait training PT Short Term Goal 2 (Week 1): Pt will transfer bed<>chair w/ mod assist PT Short Term Goal 3 (Week 1): Pt will tolerate 30 min of OOB activity w/o increase in fatigue PT Short Term Goal 4 (Week 1): Pt will maintain static standing balance w/ min assist  PT Short Term Goal 5 (Week 1): Pt will follow verbal 1-step commands 50% of the time  Skilled Therapeutic Interventions/Progress Updates:    Pt received supine in bed, agreeable to PT. No complaints of pain. Supine to sit with mod A for trunk control. Squat pivot transfer bed to w/c with max A, pt unable to follow verbal cueing via interpreter to sequence transfer and just initiates transfer on his own. Manual w/c propulsion x 150 ft with L UE/LE with min A for steering and cues to use LLE. Sit to stand x 5 reps to rail in hallway with max A, R knee blocked. Use of mirror in standing for upright posture, pt shows poor awareness of mirror even with verbal cueing. Standing weight shift L/R with max manual and verbal cues for weight shifting. Pt exhibits pushing to the R which is somewhat improved without use of LUE on rail. Pt left seated in w/c in room with needs in reach, quick release belt and lap tray in place. Medical interpreter present during therapy session.  Therapy Documentation Precautions:  Precautions Precautions: Fall Precaution Comments: non-English speaking Restrictions Weight Bearing Restrictions: No  See Function Navigator for Current Functional Status.   Therapy/Group: Individual Therapy  Peter Congoaylor Tammera Engert, PT, DPT  02/23/2018, 4:33 PM

## 2018-02-23 NOTE — Progress Notes (Signed)
[place] PHYSICAL MEDICINE & REHABILITATION     PROGRESS NOTE  Subjective/Complaints:   Non verbal, friend in room does not interact with pt  ROS: Limited due to language  Objective: Vital Signs: Blood pressure 130/81, pulse 88, temperature 98.7 F (37.1 C), temperature source Oral, resp. rate 16, weight 58 kg (127 lb 13.9 oz), SpO2 93 %. No results found. Recent Labs    02/21/18 0720  WBC 8.8  HGB 13.5  HCT 40.7  PLT 250   Recent Labs    02/21/18 0720  NA 139  K 3.9  CL 103  GLUCOSE 107*  BUN 12  CREATININE 1.09  CALCIUM 9.4   CBG (last 3)  No results for input(s): GLUCAP in the last 72 hours.  Wt Readings from Last 3 Encounters:  02/23/18 58 kg (127 lb 13.9 oz)  02/13/18 57.2 kg (126 lb 1.7 oz)  02/11/18 63.5 kg (140 lb)    Physical Exam:  BP 130/81 (BP Location: Left Arm)   Pulse 88   Temp 98.7 F (37.1 C) (Oral)   Resp 16   Wt 58 kg (127 lb 13.9 oz)   SpO2 93%   BMI 20.64 kg/m  Constitutional: He appearswell-developed.  Well-nourished.  NAD. HENT: Normocephalic.  Atraumatic. Eyes:EOMI.  No discharge. Cardiovascular:RRR.  No JVD. Respiratory:Effort normal.  Clear.  GI: He exhibitsno distension.  Bowel sounds normal. Musculoskeletal:No edema.  No tenderness. Neurological: Non-English-speaking [place] male.  ?aphasia + language.  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  Spontaneously moving [place] but difficulty with MMT even with gestural cues Skin: warm and dry. Intact. Psychiatric:Unable to assess due to language  Assessment/Plan: 1. Functional deficits secondary to multifocal bilateral infarcts which require 3+ hours per day of interdisciplinary therapy in a comprehensive inpatient rehab setting. Physiatrist is providing close team supervision and 24 hour management of active medical problems listed below. Physiatrist and rehab team continue to assess barriers to discharge/monitor patient progress toward  functional and medical goals.  Function:  Bathing Bathing position   Position: Shower  Bathing parts Body parts bathed by patient: Right arm, Chest, Abdomen, Front perineal area Body parts bathed by helper: Back, Left arm, Right upper leg, Left upper leg, Right lower leg, Left lower leg, Buttocks  Bathing assist Assist Level: Touching or steadying assistance(Pt > 75%)      Upper Body Dressing/Undressing Upper body dressing   What is the patient wearing?: Pull over shirt/dress     Pull over shirt/dress - Perfomed by patient: Thread/unthread left sleeve Pull over shirt/dress - Perfomed by helper: Thread/unthread right sleeve        Upper body assist Assist Level: Touching or steadying assistance(Pt > 75%)      Lower Body Dressing/Undressing Lower body dressing   What is the patient wearing?: Pants, Non-skid slipper socks   Underwear - Performed by helper: Thread/unthread right underwear leg, Thread/unthread left underwear leg, Pull underwear up/down Pants- Performed by patient: Thread/unthread left pants leg Pants- Performed by helper: Thread/unthread right pants leg, Pull pants up/down   Non-skid slipper socks- Performed by helper: Don/doff right sock, Don/doff left sock                  Lower body assist Assist for lower body dressing: Touching or steadying assistance (Pt > 75%)      Toileting Toileting Toileting activity did not occur: No continent bowel/bladder event Toileting steps completed by patient: Adjust clothing after toileting Toileting steps completed by helper: Performs perineal  hygiene, Adjust clothing after toileting    Toileting assist Assist level: Touching or steadying assistance (Pt.75%)   Transfers Chair/bed transfer   Chair/bed transfer method: Squat pivot Chair/bed transfer assist level: Maximal assist (Pt 25 - 49%/lift and lower) Chair/bed transfer assistive device: Armrests Mechanical lift: Stedy   Locomotion Ambulation Ambulation  activity did not occur: Safety/medical concerns         Wheelchair   Type: Manual Max wheelchair distance: 150 ft Assist Level: Supervision or verbal cues  Cognition Comprehension Comprehension assist level: Understands basic less than 25% of the time/ requires cueing >75% of the time  Expression Expression assist level: Expresses basis less than 25% of the time/requires cueing >75% of the time.  Social Interaction Social Interaction assist level: Interacts appropriately 25 - 49% of time - Needs frequent redirection.  Problem Solving Problem solving assist level: Solves basic less than 25% of the time - needs direction nearly all the time or does not effectively solve problems and may need a restraint for safety  Memory Memory assist level: Recognizes or recalls less than 25% of the time/requires cueing greater than 75% of the time    Medical Problem List and Plan: 1.Right-sided weakness and slurred speechsecondary to multifocal acute ischemia within the left hemisphere, predominantly within the deep watershed zone and left ACA/MCA cortical watershed zone. Small subacute infarct within the right side white matter adjacent to the corpus callosum splenium on 5/26 On ASA/Plavix  Continue CIR PT, OT, SLP- Team conference today please see physician documentation under team conference tab, met with team face-to-face to discuss problems,progress, and goals. Formulized individual treatment plan based on medical history, underlying problem and comorbidities.2. DVT Prophylaxis/Anticoagulation: Subcutaneous heparin 3. Pain Management:Tylenol as needed 4. Mood:Provide emotional support 5. Neuropsych: This patientis? capable of making decisions on hisown behalf. 6. Skin/Wound Care:Routine skin checks 7. Fluids/Electrolytes/Nutrition:Routine in and outs  BMP within acceptable range on 6/3 Encourage PO 8.ICA stenosis.Follow up IR for possibleICA revascularization. Procedure  based on timeframe of rehab services 9.Hyperlipidemia. Lipitor 10.  Hypertension  Lisinopril 2.5 mg daily Vitals:   02/22/18 2028 02/23/18 0529  BP: 117/80 130/81  Pulse: 87 88  Resp: 18 16  Temp: 98.2 F (36.8 C) 98.7 F (37.1 C)  SpO2: 95% 93%  good control 6/5  11. anemia-resolved  Hb 12.6 on 5/31  Recheck 6/3 13.5 12. Postoperative dysphagia  D1 nectar liquids- I 628m yesterday  Advance diet as tolerated  LOS (Days) 5 A FACE TO FACE EVALUATION WAS PERFORMED  ACharlett Blake6/01/2018 8:05 AM

## 2018-02-23 NOTE — Progress Notes (Signed)
Speech Language Pathology Daily Session Note  Patient Details  Name: Jorge Burns MRN: [id number] Date of Birth: 11/15/1940  Today's Date: 02/23/2018 SLP Individual Time: 1345-1430 SLP Individual Time Calculation (min): 45 min  Short Term Goals: Week 1: SLP Short Term Goal 1 (Week 1): Pt will initiate functional self-care activities with 50% accuracy given Max A multimodal cues.  SLP Short Term Goal 2 (Week 1): Pt will convey wants/needs with nonverbal communication ( gestures, facial expressions) with Max A multimodal cues.  SLP Short Term Goal 3 (Week 1): Pt will follow 1 step functional directions related to self with 50% accuracy and Max A multimodal cues SLP Short Term Goal 4 (Week 1): Pt will vocalize VC approximations in 50% of opportunties with Max A multimodal cues.  SLP Short Term Goal 5 (Week 1): Pt will consume ice chips/thin via TSP trials with overt s/s aspiration to demonstrate readiness for instrumental swallow assessment. SLP Short Term Goal 6 (Week 1): Pt will demonstrate efficient mastication, increased control of dys 2 trials and min overt s/s aspiration as readiness for diet advancement,  Skilled Therapeutic Interventions: Skilled treatment session focused on dysphagia and communication goals. SLP facilitated session by providing trials of ice chips. Patient demonstrated a suspected delayed swallow initiation resulting in overt s/s of aspiration. Recommend continued trials with SLP. SLP also facilitated session by providing Max A multimodal cues for patient to generate a "big breath" in order to facilitate voicing and ability to clear wet vocal quality. 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 Patient left upright in wheelchair with quick release belt in place and alarm on. Continue with current plan of care.      Function:  Eating Eating   Modified Consistency Diet: Yes Eating Assist Level: Supervision or verbal cues            Cognition Comprehension Comprehension assist level: Understands basic less than 25% of the time/ requires cueing >75% of the time  Expression   Expression assist level: Expresses basis less than 25% of the time/requires cueing >75% of the time.  Social Interaction Social Interaction assist level: Interacts appropriately 25 - 49% of time - Needs frequent redirection.  Problem Solving Problem solving assist level: Solves basic less than 25% of the time - needs direction nearly all the time or does not effectively solve problems and may need a restraint for safety  Memory Memory assist level: Recognizes or recalls less than 25% of the time/requires cueing greater than 75% of the time    Pain No/Denies Pain   Therapy/Group: Individual Therapy  Coralyn Roselli 02/23/2018, 3:44 PM

## 2018-02-23 NOTE — Patient Care Conference (Signed)
Inpatient RehabilitationTeam Conference and Plan of Care Update Date: 02/23/2018   Time: 11:15 AM    Patient Name: Jorge Burns      Medical Record Number: [id number]  Date of Birth: Aug 13, 1941 Sex: Male         Room/Bed: 4W19C/4W19C-01 Payor Info: Payor: MEDICARE / Plan: MEDICARE PART A AND B / Product Type: *No Product type* /    Admitting Diagnosis: L CVA  Admit Date/Time:  02/18/2018  5:55 PM Admission Comments: No comment available   Primary Diagnosis:  <principal problem not specified> Principal Problem: <principal problem not specified>  Patient Active Problem List   Diagnosis Date Noted  . Dysphagia, post-stroke   . Acute blood loss anemia   . Essential hypertension   . 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 Aphasic disturbance   . Right hemiparesis (HCC)   . Pulmonary fungal infection   . Diastolic dysfunction   . Prediabetes   . CVA (cerebral vascular accident) (HCC) 02/13/2018  . Influenza B 11/29/2015  . History of CVA (cerebrovascular accident)   . Language barrier   . Benign essential HTN   . Syncope 11/28/2015    Expected Discharge Date: Expected Discharge Date: 03/11/18  Team Members Present: Physician leading conference: Dr. Claudette Laws Social Worker Present: Dossie Der, LCSW Nurse Present: Other (comment)(Crystal bennett-RN) PT Present: Woodfin Ganja, PT OT Present: Kearney Hard, OT SLP Present: Feliberto Gottron, SLP PPS Coordinator present : Tora Duck, RN, CRRN     Current Status/Progress Goal Weekly Team Focus  Medical   A phasic, blood pressure is well controlled, oral intake variable with fluids, labs stable  Obtain medical stability during rehabilitation stay, transition to outpatient care  Improve activity tolerance, work on communication skills   Bowel/Bladder   pt is incontinent of b/b  LBM 06/01 had only smear on 06/03  pt will be continent of b/b with mod assist normal bowel pattern   monitor b/b toileting protocol  laxatives prn   Swallow/Nutrition/ Hydration   Dys. 1 textures with nectar-thick liquids, Mod A  Min A  trials of upgraded textures/liquids   ADL's   Max/total A  Min A overall  modified bathing/dressing, R attention, pusher syndrome, transfers, R NMR   Mobility   max assist overall, have not initiated gait yet  min assist  R attention, all functional mobility, command following w/ functional mobility   Communication   Total A  Min A for multimodal communication  following 1 step commands, vocalizations on command    Safety/Cognition/ Behavioral Observations  Mod A   Min A  sustained attention    Pain   pt appears to be comfortable no s/sx of pain  pt will have pain that <2/10  assess pain qshift and prn   Skin   ecchymosis on abdomen and bilateral arms  pt will be free of breakdown skin intact  assess skin qshift and prn      *See Care Plan and progress notes for long and short-term goals.     Barriers to Discharge  Current Status/Progress Possible Resolutions Date Resolved   Physician    Medical stability;Medication compliance     Participating in therapy program, communication and issue  Continue rehab, interpreter services would be helpful      Nursing                  PT  Other (comments)(Pt nonverbal at this time)  OT                  SLP Decreased caregiver support;Lack of/limited family support              SW Decreased caregiver support granddaughter unsure if can do any physical care            Discharge Planning/Teaching Needs:  Grandaughter reports she can not provide physical care due to caring for her own children. She wants to see how he does here due to wanting him to come home with her      Team Discussion:  Goals min assist level, has right inattention and expressive issues. MD to trial ritalin, may be depressed due to aware of his deficits. Dys 1 nectar thick trials of upgraded textures. Will ask neuro-psych to see-due to depression.  Granddaughter concerned may be too much care for her.  Revisions to Treatment Plan:  DC 6/21    Continued Need for Acute Rehabilitation Level of Care: The patient requires daily medical management by a physician with specialized training in physical medicine and rehabilitation for the following conditions: Daily direction of a multidisciplinary physical rehabilitation program to ensure safe treatment while eliciting the highest outcome that is of practical value to the patient.: Yes Daily medical management of patient stability for increased activity during participation in an intensive rehabilitation regime.: Yes Daily analysis of laboratory values and/or radiology reports with any subsequent need for medication adjustment of medical intervention for : Neurological problems;Other  Lucy ChrisDupree, Jazarah Capili G 02/23/2018, 3:56 PM

## 2018-02-23 NOTE — Progress Notes (Signed)
Physical Therapy Session Note  Patient Details  Name: Jorge Burns MRN: [id number] Date of Birth: 12/29/1940  Today's Date: 02/23/2018 PT Individual Time: 1000-1056 PT Individual Time Calculation (min): 56 min   Short Term Goals: Week 1:  PT Short Term Goal 1 (Week 1): Pt will initiate gait training PT Short Term Goal 2 (Week 1): Pt will transfer bed<>chair w/ mod assist PT Short Term Goal 3 (Week 1): Pt will tolerate 30 min of OOB activity w/o increase in fatigue PT Short Term Goal 4 (Week 1): Pt will maintain static standing balance w/ min assist  PT Short Term Goal 5 (Week 1): Pt will follow verbal 1-step commands 50% of the time  Skilled Therapeutic Interventions/Progress Updates:    Pt seated in w/c upon PT arrival, agreeable to therapy tx and denies pain. Pt transported from room>dayroom in w/c total assist. Pt used standing frame 5 min x 2 working on standing tolerance, midline and symmetric LE weightbearing while performing reaching task with L UE to visual track bean bags to R side and reach for bean bag on R side, verbal cues and hand over hand assist initially to guide L hand to R side. Pt transported to gym. Pt performed sit<>stands at L handrail with mod assist, once in standing pt worked on dynamic standing balance without UE support and R side attention in order to reach for bean bag on R side, x 2 trials with mod-max assist for standing balance. Pt performed x 2 squat pivot transfers in each direction from w/c<>mat with mod assist to the L and max assist to the R, when transferred to the R focus on visually attending to R side, manual facilitation for weightshifting during transfers. Pt transported back to room and performed stand pivot transfer to the L with use of bed rail, mod assist. Pt transferred to supine mod assist, left supine in bed with needs in reach and bed alarm set.   Therapy Documentation Precautions:  Precautions Precautions: Fall Precaution Comments: non-English  speaking Restrictions Weight Bearing Restrictions: No   See Function Navigator for Current Functional Status.   Therapy/Group: Individual Therapy  Cresenciano GenreEmily van Schagen, PT, DPT 02/23/2018, 7:56 AM

## 2018-02-23 NOTE — Progress Notes (Signed)
Social Work   Caidynce Muzyka, Elveria Rising  Social Worker  Physical Medicine and Rehabilitation  Patient Care Conference  Signed  Date of Service:  02/23/2018  3:56 PM          Signed          Show:Clear all [x] Manual[x] Template[] Copied  Added by: [x] Melvern Ramone, Lemar Livings, LCSW   [] Hover for details   Inpatient RehabilitationTeam Conference and Plan of Care Update Date: 02/23/2018   Time: 11:15 AM      Patient Name: Jorge Burns      Medical Record Number: [id number]  Date of Birth: 03/05/1941 Sex: Male         Room/Bed: 4W19C/4W19C-01 Payor Info: Payor: MEDICARE / Plan: MEDICARE PART A AND B / Product Type: *No Product type* /     Admitting Diagnosis: L CVA  Admit Date/Time:  02/18/2018  5:55 PM Admission Comments: No comment available    Primary Diagnosis:  <principal problem not specified> Principal Problem: <principal problem not specified>       Patient Active Problem List    Diagnosis Date Noted  . Dysphagia, post-stroke    . Acute blood loss anemia    . Essential hypertension    . 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 Aphasic disturbance    . Right hemiparesis (HCC)    . Pulmonary fungal infection    . Diastolic dysfunction    . Prediabetes    . CVA (cerebral vascular accident) (HCC) 02/13/2018  . Influenza B 11/29/2015  . History of CVA (cerebrovascular accident)    . Language barrier    . Benign essential HTN    . Syncope 11/28/2015      Expected Discharge Date: Expected Discharge Date: 03/11/18   Team Members Present: Physician leading conference: Dr. Claudette Laws Social Worker Present: Dossie Der, LCSW Nurse Present: Other (comment)(Crystal bennett-RN) PT Present: Woodfin Ganja, PT OT Present: Kearney Hard, OT SLP Present: Feliberto Gottron, SLP PPS Coordinator present : Tora Duck, RN, CRRN       Current Status/Progress Goal Weekly Team Focus  Medical     A phasic, blood pressure is well controlled, oral intake variable  with fluids, labs stable  Obtain medical stability during rehabilitation stay, transition to outpatient care  Improve activity tolerance, work on communication skills   Bowel/Bladder     pt is incontinent of b/b  LBM 06/01 had only smear on 06/03  pt will be continent of b/b with mod assist normal bowel pattern   monitor b/b toileting protocol laxatives prn   Swallow/Nutrition/ Hydration     Dys. 1 textures with nectar-thick liquids, Mod A  Min A  trials of upgraded textures/liquids   ADL's     Max/total A  Min A overall  modified bathing/dressing, R attention, pusher syndrome, transfers, R NMR   Mobility     max assist overall, have not initiated gait yet  min assist  R attention, all functional mobility, command following w/ functional mobility   Communication     Total A  Min A for multimodal communication  following 1 step commands, vocalizations on command    Safety/Cognition/ Behavioral Observations   Mod A   Min A  sustained attention    Pain     pt appears to be comfortable no s/sx of pain  pt will have pain that <2/10  assess pain qshift and prn   Skin     ecchymosis on abdomen and bilateral arms  pt will be  free of breakdown skin intact  assess skin qshift and prn     *See Care Plan and progress notes for long and short-term goals.      Barriers to Discharge   Current Status/Progress Possible Resolutions Date Resolved   Physician     Medical stability;Medication compliance     Participating in therapy program, communication and issue  Continue rehab, interpreter services would be helpful      Nursing                 PT  Other (comments)(Pt nonverbal at this time)                 OT                 SLP Decreased caregiver support;Lack of/limited family support            SW Decreased caregiver support granddaughter unsure if can do any physical care             Discharge Planning/Teaching Needs:  Grandaughter reports she can not provide physical care due to  caring for her own children. She wants to see how he does here due to wanting him to come home with her      Team Discussion:  Goals min assist level, has right inattention and expressive issues. MD to trial ritalin, may be depressed due to aware of his deficits. Dys 1 nectar thick trials of upgraded textures. Will ask neuro-psych to see-due to depression. Granddaughter concerned may be too much care for her.  Revisions to Treatment Plan:  DC 6/21    Continued Need for Acute Rehabilitation Level of Care: The patient requires daily medical management by a physician with specialized training in physical medicine and rehabilitation for the following conditions: Daily direction of a multidisciplinary physical rehabilitation program to ensure safe treatment while eliciting the highest outcome that is of practical value to the patient.: Yes Daily medical management of patient stability for increased activity during participation in an intensive rehabilitation regime.: Yes Daily analysis of laboratory values and/or radiology reports with any subsequent need for medication adjustment of medical intervention for : Neurological problems;Other   Jorge Burns, Jorge Burns 02/23/2018, 3:56 PM                 Patient ID: Jorge Burns, male   DOB: 01/09/1941, 77 y.o.   MRN: [id number]

## 2018-02-23 NOTE — Progress Notes (Signed)
Occupational Therapy Session Note  Patient Details  Name: Jorge Burns MRN: [id number] Date of Birth: [birth year]  Today's Date: 02/23/2018 OT Individual Time: 0900-1000 OT Individual Time Calculation (min): 60 min   Short Term Goals: Week 1:  OT Short Term Goal 1 (Week 1): Pt will sit EOM for 5 min with supervision during functional task OT Short Term Goal 2 (Week 1): Pt will transfer to [facility] with MOD A of 1 and LRAD OT Short Term Goal 3 (Week 1): Pt will sit to stand with MIN A for balance in prep for clothing managmnet OT Short Term Goal 4 (Week 1): Pt will locate 2 items on R of sink wiht min VC  Skilled Therapeutic Interventions/Progress Updates:    Pt greeted semi-reclined in bed, interpreter only present for 1/2 of session, so utilized gestures and multimodal cues to communicate commands to pt. OT retrieved w/c cushion as his did not have one. Pt then came to sitting EOB with max. Squat-pivot bed to wc on L side with max A using bobath method. Pt brought to the sink in wc and completed UB bathing with OT providing hand over hand A to R UE for neuro re-ed. Attempted sit<>stand at the sink without Stedy or AD and pt with severe pushing to R and unable to achieve hip/trunk extension despite max A and facilitation. Returned to sitting and Stedy used for sit<>stand with max A. Removed brief and pt incontinent of BM. Max A to transfer pt onto commode using Stedy with OT blocking body on R side 2/2 pushing and lateral lean. Pt voided a small amount of BM in commode but was mostly incontinent. Total A for peri-care and brief change with max A to stand from lower commode. Dressing completed in straight back chair against wall 2/2 Stedy not fitting well under wc. Pt able to assist with dressing L side of body but needed total A dress R side. Stedy used to transfer pt back to wc with w max A. Pt left with needs met and interpreter present.   Therapy Documentation Precautions:  Precautions Precautions:  Fall Precaution Comments: non-English speaking Restrictions Weight Bearing Restrictions: No  See Function Navigator for Current Functional Status.   Therapy/Group: Individual Therapy  Daneen Schick Ardeth Repetto 02/23/2018, 10:00 AM

## 2018-02-24 ENCOUNTER — Inpatient Hospital Stay (HOSPITAL_COMMUNITY): Payer: Medicare Other | Admitting: Speech Pathology

## 2018-02-24 ENCOUNTER — Inpatient Hospital Stay (HOSPITAL_COMMUNITY): Payer: Medicare Other | Admitting: Physical Therapy

## 2018-02-24 ENCOUNTER — Inpatient Hospital Stay (HOSPITAL_COMMUNITY): Payer: Medicare Other | Admitting: Occupational Therapy

## 2018-02-24 ENCOUNTER — Ambulatory Visit (HOSPITAL_COMMUNITY): Payer: Medicare Other

## 2018-02-24 NOTE — Progress Notes (Signed)
Speech Language Pathology Daily Session Note  Patient Details  Name: Jorge Burns MRN: [id number] Date of Birth: 09/04/1941  Today's Date: 02/24/2018 SLP Individual Time: 1300-1345 SLP Individual Time Calculation (min): 45 min  Short Term Goals: Week 1: SLP Short Term Goal 1 (Week 1): Pt will initiate functional self-care activities with 50% accuracy given Max A multimodal cues.  SLP Short Term Goal 2 (Week 1): Pt will convey wants/needs with nonverbal communication ( gestures, facial expressions) with Max A multimodal cues.  SLP Short Term Goal 3 (Week 1): Pt will follow 1 step functional directions related to self with 50% accuracy and Max A multimodal cues SLP Short Term Goal 4 (Week 1): Pt will vocalize VC approximations in 50% of opportunties with Max A multimodal cues.  SLP Short Term Goal 5 (Week 1): Pt will consume ice chips/thin via TSP trials with overt s/s aspiration to demonstrate readiness for instrumental swallow assessment. SLP Short Term Goal 6 (Week 1): Pt will demonstrate efficient mastication, increased control of dys 2 trials and min overt s/s aspiration as readiness for diet advancement,  Skilled Therapeutic Interventions: Skilled treatment session focused on speech goals. Trials of liquids were not attempted this session due to patient just completing his lunch meal. SLP facilitated session by providing Mod-Max A verbal and visual cues for use of a "big breath" in order to produce the phoneme /m/. Patient able to produce vocalization on command with 90% accuracy and Min A verbal and visual cues. However, patient unable to produce any other phoneme in isolation or automatic sequences despite Max A multimodal cues. Patient also required Max-Total A to follow 1-step commands. Patient left upright in wheelchair with all needs within reach and quick release belt in place. Continue with current plan of care.      Function:   Cognition Comprehension Comprehension assist level:  Understands basic less than 25% of the time/ requires cueing >75% of the time  Expression   Expression assist level: Expresses basis less than 25% of the time/requires cueing >75% of the time.  Social Interaction Social Interaction assist level: Interacts appropriately less than 25% of the time. May be withdrawn or combative.  Problem Solving Problem solving assist level: Solves basic less than 25% of the time - needs direction nearly all the time or does not effectively solve problems and may need a restraint for safety  Memory Memory assist level: Recognizes or recalls less than 25% of the time/requires cueing greater than 75% of the time    Pain No/Denies Pain   Therapy/Group: Individual Therapy  Jorge Burns 02/24/2018, 3:07 PM

## 2018-02-24 NOTE — Progress Notes (Addendum)
Occupational Therapy Weekly Progress Note  Patient Details  Name: Jorge Burns MRN: [id number] Date of Birth: November 04, 1940  Beginning of progress report period: February 19, 2018 End of progress report period: February 25, 2018  Today's Date: 02/25/2018 OT Individual Time: 0904-1001 OT Individual Time Calculation (min): 57 min    Patient has met 0 of 4 short term goals.  Pt is progressing slowly towards OT goals at this time. Communication is difficult with language barrier and difficulty timing appropriate cues to pt within BADL tasks. Pt is able to sequence some parts of BADL tasks, but has difficulty with apraxia and motor planning movements. No muscle activation has been noted in R UE. Pt continues to need max multimodal cues to locate objects on R side of body. Continue current plan of care    Patient continues to demonstrate the following deficits: muscle weakness, impaired timing and sequencing, abnormal tone, unbalanced muscle activation, motor apraxia, ataxia, decreased coordination and decreased motor planning, decreased midline orientation, decreased attention to right, right side neglect, decreased motor planning and ideational apraxia, decreased initiation, decreased attention, decreased awareness, decreased problem solving, decreased safety awareness, decreased memory and delayed processing and decreased sitting balance, decreased standing balance, decreased postural control, hemiplegia and decreased balance strategies and therefore will continue to benefit from skilled OT intervention to enhance overall performance with BADL and Reduce care partner burden.  Patient progressing toward long term goals..  Continue plan of care.  OT Short Term Goals Week 1:  OT Short Term Goal 1 (Week 1): Pt will sit EOM for 5 min with supervision during functional task OT Short Term Goal 1 - Progress (Week 1): Progressing toward goal OT Short Term Goal 2 (Week 1): Pt will transfer to [facility] with MOD A of 1 and LRAD OT  Short Term Goal 2 - Progress (Week 1): Progressing toward goal OT Short Term Goal 3 (Week 1): Pt will sit to stand with MIN A for balance in prep for clothing managmnet OT Short Term Goal 3 - Progress (Week 1): Progressing toward goal OT Short Term Goal 4 (Week 1): Pt will locate 2 items on R of sink wiht min VC OT Short Term Goal 4 - Progress (Week 1): Progressing toward goal Week 2:  OT Short Term Goal 1 (Week 2): Pt will sit EOM for 5 min with supervision during functional task OT Short Term Goal 2 (Week 2): Pt will transfer to [facility] with MOD A of 1 and LRAD OT Short Term Goal 3 (Week 2): Pt will sit to stand with Mod A for balance in prep for clothing managmnet OT Short Term Goal 4 (Week 2): Pt will locate 2 items on R of sink wiht min VC  Skilled Therapeutic Interventions/Progress Updates:    Pt greeted semi-reclined in bed with interpreter present and agreeable to OT treatment session. Pt came to sitting EOB with mod/max A. Pt needed max A initially to achieve sitting balance 2/2 posterior LOB and pushing to the R. Tried to have pt lean down onto L elbow, but he was unable to understand commands and continued pushing. Brought wc to L side and had pt reach for arm rest with decreased pushing. Max A squat-pivot to wc on L. Stedy used to transfer pt into shower with max A sit<>stand. Pt able to achieve midline sitting balance using mirror feedback. Pt with more trunk control sitting on tub bench today. Pt needed demonstrational cues to initiate bathing tasks and perseverated on washing some body parts.  Pt noted to have smear of BM when washing bottom, so pt transferred onto toilet after shower via stedy with max A. Pt voided bowel and bladder in commode. Total A for peri-care and brief change. Dressing completed wc level at the sink with pt able to thread LLE into pant legs and a mod A stand from stedy while OT pulled up pants. Pt left seated in wc with granddaughter and interpreter present, needs met.    Therapy Documentation Precautions:  Precautions Precautions: Fall Precaution Comments: non-English speaking Restrictions Weight Bearing Restrictions: No Pain:   _0 See Function Navigator for Current Functional Status.   Therapy/Group: Individual Therapy  Valma Cava 02/25/2018, 10:05 AM

## 2018-02-24 NOTE — Progress Notes (Signed)
[place] PHYSICAL MEDICINE & REHABILITATION     PROGRESS NOTE  Subjective/Complaints:   Aphasic and with language barrier  ROS: Limited due to language  Objective: Vital Signs: Blood pressure 118/64, pulse 73, temperature 98.7 F (37.1 C), resp. rate 18, weight 58 kg (127 lb 13.9 oz), SpO2 96 %. No results found. No results for input(s): WBC, HGB, HCT, PLT in the last 72 hours. No results for input(s): NA, K, CL, GLUCOSE, BUN, CREATININE, CALCIUM in the last 72 hours.  Invalid input(s): CO CBG (last 3)  No results for input(s): GLUCAP in the last 72 hours.  Wt Readings from Last 3 Encounters:  02/23/18 58 kg (127 lb 13.9 oz)  02/13/18 57.2 kg (126 lb 1.7 oz)  02/11/18 63.5 kg (140 lb)    Physical Exam:  BP 118/64 (BP Location: Left Arm)   Pulse 73   Temp 98.7 F (37.1 C)   Resp 18   Wt 58 kg (127 lb 13.9 oz)   SpO2 96%   BMI 20.64 kg/m  Constitutional: He appearswell-developed.  Well-nourished.  NAD. HENT: Normocephalic.  Atraumatic. Eyes:EOMI.  No discharge. Cardiovascular:RRR.  No JVD. Respiratory:Effort normal.  Clear.  GI: He exhibitsno distension.  Bowel sounds normal. Musculoskeletal:No edema.  No tenderness. Neurological: Non-English-speaking [place] male.  ?aphasia + language.  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  Spontaneously moving LUE/LLE but difficulty with MMT even with gestural cues Skin: warm and dry. Intact. Psychiatric:Unable to assess due to language  Assessment/Plan: 1. Functional deficits secondary to multifocal bilateral infarcts which require 3+ hours per day of interdisciplinary therapy in a comprehensive inpatient rehab setting. Physiatrist is providing close team supervision and 24 hour management of active medical problems listed below. Physiatrist and rehab team continue to assess barriers to discharge/monitor patient progress toward functional and medical goals.  Function:  Bathing Bathing  position   Position: Wheelchair/chair at sink  Bathing parts Body parts bathed by patient: Right arm, Chest, Abdomen, Front perineal area Body parts bathed by helper: Back, Left arm, Right upper leg, Left upper leg, Right lower leg, Left lower leg, Buttocks  Bathing assist Assist Level: Touching or steadying assistance(Pt > 75%)      Upper Body Dressing/Undressing Upper body dressing   What is the patient wearing?: Pull over shirt/dress     Pull over shirt/dress - Perfomed by patient: Thread/unthread left sleeve, Put head through opening Pull over shirt/dress - Perfomed by helper: Pull shirt over trunk, Thread/unthread right sleeve        Upper body assist Assist Level: Touching or steadying assistance(Pt > 75%)      Lower Body Dressing/Undressing Lower body dressing   What is the patient wearing?: Pants, Non-skid slipper socks   Underwear - Performed by helper: Thread/unthread right underwear leg, Thread/unthread left underwear leg, Pull underwear up/down Pants- Performed by patient: Thread/unthread left pants leg Pants- Performed by helper: Thread/unthread right pants leg, Pull pants up/down   Non-skid slipper socks- Performed by helper: Don/doff right sock, Don/doff left sock                  Lower body assist Assist for lower body dressing: Touching or steadying assistance (Pt > 75%)      Toileting Toileting Toileting activity did not occur: No continent bowel/bladder event Toileting steps completed by patient: Adjust clothing after toileting Toileting steps completed by helper: Adjust clothing prior to toileting, Performs perineal hygiene, Adjust clothing after toileting    Toileting assist Assist level:  Touching or steadying assistance (Pt.75%)   Transfers Chair/bed transfer   Chair/bed transfer method: Squat pivot Chair/bed transfer assist level: Maximal assist (Pt 25 - 49%/lift and lower) Chair/bed transfer assistive device: Armrests Mechanical lift:  Stedy   Locomotion Ambulation Ambulation activity did not occur: Safety/medical concerns         Wheelchair   Type: Manual Max wheelchair distance: 150 ft Assist Level: Touching or steadying assistance (Pt > 75%)  Cognition Comprehension Comprehension assist level: Understands basic less than 25% of the time/ requires cueing >75% of the time  Expression Expression assist level: Expresses basis less than 25% of the time/requires cueing >75% of the time.  Social Interaction Social Interaction assist level: Interacts appropriately 25 - 49% of time - Needs frequent redirection.  Problem Solving Problem solving assist level: Solves basic less than 25% of the time - needs direction nearly all the time or does not effectively solve problems and may need a restraint for safety  Memory Memory assist level: Recognizes or recalls less than 25% of the time/requires cueing greater than 75% of the time    Medical Problem List and Plan: 1.Right-sided weakness and slurred speechsecondary to multifocal acute ischemia within the left hemisphere, predominantly within the deep watershed zone and left ACA/MCA cortical watershed zone. Small subacute infarct within the right side white matter adjacent to the corpus callosum splenium on 5/26 On ASA/Plavix  Continue CIR PT, OT, SLP- 2. DVT Prophylaxis/Anticoagulation: Subcutaneous heparin 3. Pain Management:Tylenol as needed 4. Mood:Provide emotional support 5. Neuropsych: This patientis? capable of making decisions on hisown behalf. 6. Skin/Wound Care:Routine skin checks 7. Fluids/Electrolytes/Nutrition:Routine in and outs  BMP within acceptable range on 6/3 Encourage PO, I 300ml recorded with meals yesterday 8.ICA stenosis.Follow up IR for possibleICA revascularization. Procedure based on timeframe of rehab services 9.Hyperlipidemia. Lipitor 10.  Hypertension  Lisinopril 2.5 mg daily Vitals:   02/23/18 2008 02/24/18 0406   BP: (!) 99/52 118/64  Pulse: 92 73  Resp: 18 18  Temp: 98.2 F (36.8 C) 98.7 F (37.1 C)  SpO2: 95% 96%  good control 6/6- some lower reading monitor for trend  11. anemia-resolved  Hb 12.6 on 5/31  Recheck 6/3 13.5 12. Postoperative dysphagia  D1 nectar liquids- I 600ml yesterday  Advance diet as tolerated  LOS (Days) 6 A FACE TO FACE EVALUATION WAS PERFORMED  Erick Colacendrew E Kami Kube 02/24/2018 8:09 AM

## 2018-02-24 NOTE — Progress Notes (Signed)
Occupational Therapy Session Note  Patient Details  Name: Jorge Burns MRN: [id number] Date of Birth: 04/11/1941  Today's Date: 02/24/2018 OT Individual Time: 0903-1000 OT Individual Time Calculation (min): 57 min    Short Term Goals: Week 1:  OT Short Term Goal 1 (Week 1): Pt will sit EOM for 5 min with supervision during functional task OT Short Term Goal 2 (Week 1): Pt will transfer to [facility] with MOD A of 1 and LRAD OT Short Term Goal 3 (Week 1): Pt will sit to stand with MIN A for balance in prep for clothing managmnet OT Short Term Goal 4 (Week 1): Pt will locate 2 items on R of sink wiht min VC  Skilled Therapeutic Interventions/Progress Updates:    Pt greeted semi-reclined in bed with interpreter present. Pt came to sitting EOB with mod A and tactile cues. Decreased pushing by having pt lean onto L elbow while OT set-up wc. Max A stand-pivot to wc on L side. Pt brought to the sink for modified bathing/dressing tasks. Despite max multimodal cues, unable to get pt to look past midline to attend to R side of body. Utilized HOH A to L hand to cross midline and wash R UE. Sit<>stands from wc at the sink with pt pulling up on sink, OT blocking R knee, and pt able to stand with max A. Max A to maintain standing balance 2/2 R lateral lean and pushing. Tried to incorporate mirror feedback, but pt unable to follow commands to correct lean. Worked on sequencing, initiation, and apraxia within dressing tasks. Dynavision used for visual scanning and R attention. Pt needed OT to facilitate head turns to the R, then he was able to locate light on R side. He also needed guided A to L UE to push button 2/2 apraxia and difficulty with accuracy and pressure. Pt returned to room and left seated in wc with chiar alarm on and needs met.   Therapy Documentation Precautions:  Precautions Precautions: Fall Precaution Comments: non-English speaking Restrictions Weight Bearing Restrictions: No  See Function Navigator  for Current Functional Status.   Therapy/Group: Individual Therapy  Valma Cava 02/24/2018, 10:02 AM

## 2018-02-24 NOTE — Progress Notes (Signed)
Physical Therapy Session Note  Patient Details  Name: Jorge Burns MRN: [id number] Date of Birth: Jun 18, 1941  Today's Date: 02/24/2018 PT Individual Time: 1000-1055 AND 1345-1410 PT Individual Time Calculation (min): 55 min AND 25 min  Short Term Goals: Week 1:  PT Short Term Goal 1 (Week 1): Pt will initiate gait training PT Short Term Goal 2 (Week 1): Pt will transfer bed<>chair w/ mod assist PT Short Term Goal 3 (Week 1): Pt will tolerate 30 min of OOB activity w/o increase in fatigue PT Short Term Goal 4 (Week 1): Pt will maintain static standing balance w/ min assist  PT Short Term Goal 5 (Week 1): Pt will follow verbal 1-step commands 50% of the time  Skilled Therapeutic Interventions/Progress Updates:   Session 1:  Pt in w/c and agreeable to therapy, no evidence of pain. Interpreter present throughout session. Worked on self-propelling w/c to gym via L hemi technique. Max verbal and visual cues for technique, coordination of UE and LEs, and attention to task. Min manual assist for obstacle avoidance and staying on a straight path. Performed blocked practice of sit<>stands at rail in hallway, mod assist overall to boost w/ LUE support on rail. Worked on pregait tasks in standing including lateral weight shifts and LLE forward and backward stepping. Tactile and manual cues for R quad activation w/ trace contraction in forced WB. Used mirror for visual feedback in attempts to orient to upright and midline, however pt unable to reach midline to look in mirror despite max multimodal cues. However pt seemed more responsive to verbal cues this session compared to previous sessions with this therapist. Ambulated at rail 15' x2 reps w/ w/c follow and total assist for RLE management. Max assist during second bout after seated rest, improved reciprocal pattern as well. Returned to room and ended session in w/c, call bell within reach and all needs met. Quick release belt and chair alarm engaged.   Session  2:  Pt in w/c and agreeable to therapy, no evidence of pain. Interpreter present throughout session. Pt self-propelled w/c to therapy gym w/ min verbal and visual cues for L hemi technique, much improved from morning session - min assist overall. Transferred to/from mat w/ max assist and worked on dynamic sitting balance requiring reaching forward to facilitate anterior weight shifting and crossing midline w/ LUE and eye movements for increased R attention during functional tasks. Max verbal, tactile, and visual cues for attention to R side, however able to accomplish reaching task to R visual field w/ coordinating eye movements in 50% of attempts. Returned to room, total assist in w/c. Ended session in supine, call bell within reach and all needs met.    Therapy Documentation Precautions:  Precautions Precautions: Fall Precaution Comments: non-English speaking Restrictions Weight Bearing Restrictions: No Vital Signs: Therapy Vitals Temp: 98.2 F (36.8 C) Temp Source: Oral Pulse Rate: 86 Resp: 18 BP: 123/72 Patient Position (if appropriate): Lying Oxygen Therapy SpO2: 96 % O2 Device: Room Air  See Function Navigator for Current Functional Status.   Therapy/Group: Individual Therapy  Alyx Mcguirk K Arnette 02/24/2018, 3:35 PM

## 2018-02-25 ENCOUNTER — Inpatient Hospital Stay (HOSPITAL_COMMUNITY): Payer: Medicare Other | Admitting: Physical Therapy

## 2018-02-25 ENCOUNTER — Inpatient Hospital Stay (HOSPITAL_COMMUNITY): Payer: Medicare Other | Admitting: Speech Pathology

## 2018-02-25 ENCOUNTER — Inpatient Hospital Stay (HOSPITAL_COMMUNITY): Payer: Medicare Other | Admitting: Occupational Therapy

## 2018-02-25 NOTE — Progress Notes (Signed)
Speech Language Pathology Weekly Progress and Session Note  Patient Details  Name: Jorge Burns MRN: [id number] Date of Birth: 11-13-1940  Beginning of progress report period: Feb 18, 2018 End of progress report period: February 25, 2018  Today's Date: 02/25/2018 SLP Individual Time: 1007-1219 SLP Individual Time Calculation (min): 45 min  Short Term Goals: Week 1: SLP Short Term Goal 1 (Week 1): Pt will initiate functional self-care activities with 50% accuracy given Max A multimodal cues.  SLP Short Term Goal 1 - Progress (Week 1): Met SLP Short Term Goal 2 (Week 1): Pt will convey wants/needs with nonverbal communication ( gestures, facial expressions) with Max A multimodal cues.  SLP Short Term Goal 2 - Progress (Week 1): Not met SLP Short Term Goal 3 (Week 1): Pt will follow 1 step functional directions related to self with 50% accuracy and Max A multimodal cues SLP Short Term Goal 3 - Progress (Week 1): Met SLP Short Term Goal 4 (Week 1): Pt will vocalize VC approximations in 50% of opportunties with Max A multimodal cues.  SLP Short Term Goal 4 - Progress (Week 1): Not met SLP Short Term Goal 5 (Week 1): Pt will consume ice chips/thin via TSP trials with overt s/s aspiration to demonstrate readiness for instrumental swallow assessment. SLP Short Term Goal 5 - Progress (Week 1): Not met SLP Short Term Goal 6 (Week 1): Pt will demonstrate efficient mastication, increased control of dys 2 trials and min overt s/s aspiration as readiness for diet advancement, SLP Short Term Goal 6 - Progress (Week 1): Not met    New Short Term Goals: Week 2: SLP Short Term Goal 1 (Week 2): Pt will convey wants/needs with use of multimodal communication ( gestures, facial expressions) with Max A multimodal cues.  SLP Short Term Goal 2 (Week 2): Patient will vocalize at the CV level in 50% of opportunities with Max A multimodal cues.  SLP Short Term Goal 3 (Week 2): Pt will follow 1 step functional directions  related to self with 75% accuracy and Mod A multimodal cues SLP Short Term Goal 4 (Week 2): Patient will identify functional items from a field of 2 with 50% accuracy and Max A multimodal cues.  SLP Short Term Goal 5 (Week 2): Pt will consume trials of ice chips/thin liquids via TSP  with minimal overt s/s aspiration and Mod A verbal cues over 2 sessions to demonstrate readiness for repeat MBS.  SLP Short Term Goal 6 (Week 2): Pt will demonstrate efficient mastication and complete oral clearance of dys 2 texture trials without overt s/s aspiration and Min A verbal cues over 2 sessions prior to upgrade.   Weekly Progress Updates: Patient has made minimal gains and has met 2 of 6 STG's this reporting period. Currently, patient is consuming Dys. 1 textures with nectar-thick liquids and continues to demonstrate intermittent s/s of aspiration with trials of ice chips and Dys. 2 textures. Patient remains nonverbal without attempts to communicate wants/needs with use of multimodal communication but can vocalize the phoneme /m/ on command with Mod A multimodal cues. Patient also continues to require overall Max A to follow basic commands. Patient also demonstrates increased initiation, attention and participation in sessions. Patient and family education is ongoing. Patient would benefit from continued skilled SLP intervention to maximize his cognitive-linguistic and swallowing function prior to discharge.      Intensity: Minumum of 1-2 x/day, 30 to 90 minutes Frequency: 3 to 5 out of 7 days Duration/Length of Stay: 03/11/18  Treatment/Interventions: Cognitive remediation/compensation;Cueing hierarchy;Dysphagia/aspiration precaution training;Functional tasks;Patient/family education;Therapeutic Activities;Environmental controls;Speech/Language facilitation   Daily Session  Skilled Therapeutic Interventions: Skilled treatment session focused on dysphagia and communication goals. SLP facilitated sesion with  trials of Dys. 2 textures. Patient demonstrated efficient mastication with mild oral residue that cleared with a cued liquid wash with wet vocal quality X 1. Recommend continued trials with SLP. SLP also facilitated session by providing Mod A verbal and visual cues for use of a "big breath" in order to produce the phoneme /m/. Patient able to produce vocalization on command with 90% accuracy and Min A verbal and visual cues. 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 Patient left upright in wheelchair with all needs within reach and quick release belt in place. Continue with current plan of care.         Function:   Eating Eating   Modified Consistency Diet: Yes Eating Assist Level: Supervision or verbal cues;Set up assist for   Eating Set Up Assist For: Opening containers       Cognition Comprehension Comprehension assist level: Understands basic less than 25% of the time/ requires cueing >75% of the time  Expression   Expression assist level: Expresses basis less than 25% of the time/requires cueing >75% of the time.  Social Interaction Social Interaction assist level: Interacts appropriately less than 25% of the time. May be withdrawn or combative.  Problem Solving Problem solving assist level: Solves basic less than 25% of the time - needs direction nearly all the time or does not effectively solve problems and may need a restraint for safety  Memory Memory assist level: Recognizes or recalls less than 25% of the time/requires cueing greater than 75% of the time   Pain No/Denies Pain   Therapy/Group: Individual Therapy  Yanni Ruberg 02/25/2018, 3:33 PM

## 2018-02-25 NOTE — Progress Notes (Signed)
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   MRN: [id number]  Granddaughter was here to observe pt in therapies and offer her support. She hopes he will do a lot better before he goes home 6/21. She still has concerns about being able to care for him at home. Will continue to work on best plan for pt.

## 2018-02-25 NOTE — Progress Notes (Signed)
Physical Therapy Weekly Progress Note  Patient Details  Name: Marselino Slayton MRN: [id number] Date of Birth: December 03, 1940  Beginning of progress report period: February 19, 2018 End of progress report period: February 25, 2018  Today's Date: 02/25/2018 PT Individual Time: 1000-1055 AND 1345-1410 PT Individual Time Calculation (min): 55 min AND 25 min  Patient has met 3 of 5 short term goals. Pt continues to perform most mobility at the max assist level including transfers and gait. Occasionally, he will transfer bed<>chair w/ mod assist at best. He is ambulating w/ max assist at rail in hallway, 30' at a time, w/ max manual assist for RLE management. Despite remaining mostly at max assist level, he has engaged and participated more in therapy, follows simple 1-step commands ~25% of the time, and can attend to R environment and R side of body w/ max multimodal cues consistently.    Patient continues to demonstrate the following deficits muscle weakness, decreased cardiorespiratoy endurance, impaired timing and sequencing, unbalanced muscle activation, motor apraxia, decreased coordination and decreased motor planning, decreased midline orientation and decreased attention to right, decreased initiation, decreased attention, decreased awareness, decreased problem solving, decreased safety awareness, decreased memory and delayed processing and decreased sitting balance, decreased standing balance, decreased postural control, hemiplegia and decreased balance strategies and therefore will continue to benefit from skilled PT intervention to increase functional independence with mobility.  Patient progressing toward long term goals..  Continue plan of care.  PT Short Term Goals Week 1:  PT Short Term Goal 1 (Week 1): Pt will initiate gait training PT Short Term Goal 1 - Progress (Week 1): Met PT Short Term Goal 2 (Week 1): Pt will transfer bed<>chair w/ mod assist PT Short Term Goal 2 - Progress (Week 1): Met PT Short  Term Goal 3 (Week 1): Pt will tolerate 30 min of OOB activity w/o increase in fatigue PT Short Term Goal 3 - Progress (Week 1): Met PT Short Term Goal 4 (Week 1): Pt will maintain static standing balance w/ min assist  PT Short Term Goal 4 - Progress (Week 1): Not met PT Short Term Goal 5 (Week 1): Pt will follow verbal 1-step commands 50% of the time PT Short Term Goal 5 - Progress (Week 1): Progressing toward goal Week 2:  PT Short Term Goal 1 (Week 2): Pt will maintain dynamic sitting balance w/ supervision PT Short Term Goal 2 (Week 2): Pt will attend to R environment during functional mobility w/ min cues 50% of the time PT Short Term Goal 3 (Week 2): Pt will ambulate 15' w/ mod assist w/ LRAD PT Short Term Goal 4 (Week 2): Pt will follow verbal commands during functional mobility 50% of the time PT Short Term Goal 5 (Week 2): Pt will self-propel w/c 150' w/ supervision in controlled environment  Skilled Therapeutic Interventions/Progress Updates:   Session 1:  Pt in w/c and agreeable to therapy, no c/o or evidence of pain. Pt self-propelled w/c to/from therapy gym, primarily w/ supervision using L hemi technique, occasional min assist for obstacle avoidance on L side. Ambulated at rail in hallway, 30', w/ max assist for RLE management and R upright posture. Improved reciprocal pattern this session compared to yesterday and increased R quad activation in single leg stance. Worked on L pushing tendencies, postural control, and R attention in seated remainder of session as pt continues to push heavily towards R side during all functional mobility. Performed LUE reaching tasks requiring pt to weight shift anteriorly and/or laterally,  cross midline to R, and break L pushing. Max verbal, visual, and tactile cues to perform tasks successfully. Postural control and righting reactions more consistently activating towards end of session, but pt still requires min-mod assist to correct LOB in seated.  Returned to room and ended session in w/c, call bell within reach and all needs met. Quick release belt and chair alarm engaged.   Session 2:  Pt in w/c and agreeable to therapy, no c/o or evidence of pain. Total assist w/c transport to/from therapy gym for time management. Worked on blocked practice of sit<>stands to high/low table and postural control in standing. Emphasis on powering up w/ L side of body and decreasing pushing towards R side. Table used as UE support so pt could support himself w/ open hand instead of pulling on rail or AD. Static standing balance improved w/ this method, however continues to require max fading to min assist to maintain. Tactile facilitation of anterior weight shifting in stance (pt has posterior bias in standing). Mod assist to boost into standing w/ manual facilitation of anterior weight shifting and L power up. Pt initiated last stand w/ verbal cues only, first time he has initiated this while with this therapist. Returned to room and ended session in supine, call bell within reach and all needs met.   Therapy Documentation Precautions:  Precautions Precautions: Fall Precaution Comments: non-English speaking Restrictions Weight Bearing Restrictions: No Vital Signs:    See Function Navigator for Current Functional Status.  Therapy/Group: Individual Therapy  Hokulani Rogel K Arnette 02/25/2018, 11:25 AM

## 2018-02-25 NOTE — Progress Notes (Signed)
[facility] PHYSICAL MEDICINE & REHABILITATION     PROGRESS NOTE  Subjective/Complaints:   Smiling more but non verbal  ROS: Limited due to language  Objective: Vital Signs: Blood pressure 124/73, pulse 72, temperature 98 F (36.7 C), resp. rate 18, weight 58 kg (127 lb 13.9 oz), SpO2 97 %. No results found. No results for input(s): WBC, HGB, HCT, PLT in the last 72 hours. No results for input(s): NA, K, CL, GLUCOSE, BUN, CREATININE, CALCIUM in the last 72 hours.  Invalid input(s): CO CBG (last 3)  No results for input(s): GLUCAP in the last 72 hours.  Wt Readings from Last 3 Encounters:  02/23/18 58 kg (127 lb 13.9 oz)  02/13/18 57.2 kg (126 lb 1.7 oz)  02/11/18 63.5 kg (140 lb)    Physical Exam:  BP 124/73 (BP Location: Left Arm)   Pulse 72   Temp 98 F (36.7 C)   Resp 18   Wt 58 kg (127 lb 13.9 oz)   SpO2 97%   BMI 20.64 kg/m  Constitutional: He appearswell-developed.  Well-nourished.  NAD. HENT: Normocephalic.  Atraumatic. Eyes:EOMI.  No discharge. Cardiovascular:RRR.  No JVD. Respiratory:Effort normal.  Clear.  GI: He exhibitsno distension.  Bowel sounds normal. Musculoskeletal:No edema.  No tenderness. Neurological: Non-English-speaking [place] male.  ?aphasia + language.  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  Spontaneously moving LUE/LLE but difficulty with MMT even with gestural cues Skin: warm and dry. Intact. Psychiatric:Unable to assess due to language  Assessment/Plan: 1. Functional deficits secondary to multifocal bilateral infarcts which require 3+ hours per day of interdisciplinary therapy in a comprehensive inpatient rehab setting. Physiatrist is providing close team supervision and 24 hour management of active medical problems listed below. Physiatrist and rehab team continue to assess barriers to discharge/monitor patient progress toward functional and medical goals.  Function:  Bathing Bathing position    Position: Wheelchair/chair at sink  Bathing parts Body parts bathed by patient: Right arm, Chest, Abdomen, Front perineal area Body parts bathed by helper: Back, Left arm, Right upper leg, Left upper leg, Right lower leg, Left lower leg, Buttocks  Bathing assist Assist Level: Touching or steadying assistance(Pt > 75%)      Upper Body Dressing/Undressing Upper body dressing   What is the patient wearing?: Pull over shirt/dress     Pull over shirt/dress - Perfomed by patient: Thread/unthread left sleeve, Put head through opening Pull over shirt/dress - Perfomed by helper: Pull shirt over trunk, Thread/unthread right sleeve        Upper body assist Assist Level: Touching or steadying assistance(Pt > 75%)      Lower Body Dressing/Undressing Lower body dressing   What is the patient wearing?: Pants, Non-skid slipper socks   Underwear - Performed by helper: Thread/unthread right underwear leg, Thread/unthread left underwear leg, Pull underwear up/down Pants- Performed by patient: Thread/unthread left pants leg Pants- Performed by helper: Thread/unthread right pants leg, Pull pants up/down   Non-skid slipper socks- Performed by helper: Don/doff right sock, Don/doff left sock                  Lower body assist Assist for lower body dressing: Touching or steadying assistance (Pt > 75%)      Toileting Toileting Toileting activity did not occur: No continent bowel/bladder event Toileting steps completed by patient: Adjust clothing after toileting Toileting steps completed by helper: Adjust clothing prior to toileting, Performs perineal hygiene, Adjust clothing after toileting    Toileting assist Assist level:  Touching or steadying assistance (Pt.75%)   Transfers Chair/bed transfer   Chair/bed transfer method: Squat pivot Chair/bed transfer assist level: Maximal assist (Pt 25 - 49%/lift and lower) Chair/bed transfer assistive device: Armrests Mechanical lift: Stedy    Locomotion Ambulation Ambulation activity did not occur: Safety/medical concerns   Max distance: 15' Assist level: Total assist (Pt < 25%)   Wheelchair   Type: Manual Max wheelchair distance: 150 ft Assist Level: Touching or steadying assistance (Pt > 75%)  Cognition Comprehension Comprehension assist level: Understands basic less than 25% of the time/ requires cueing >75% of the time, Other (comment)  Expression Expression assist level: Expresses basis less than 25% of the time/requires cueing >75% of the time.  Social Interaction Social Interaction assist level: Interacts appropriately less than 25% of the time. May be withdrawn or combative.  Problem Solving Problem solving assist level: Solves basic less than 25% of the time - needs direction nearly all the time or does not effectively solve problems and may need a restraint for safety  Memory Memory assist level: Recognizes or recalls less than 25% of the time/requires cueing greater than 75% of the time    Medical Problem List and Plan: 1.Right-sided weakness and slurred speechsecondary to multifocal acute ischemia within the left hemisphere, predominantly within the deep watershed zone and left ACA/MCA cortical watershed zone. Small subacute infarct within the right side white matter adjacent to the corpus callosum splenium on 5/26 On ASA/Plavix  Continue CIR PT, OT, SLP- 2. DVT Prophylaxis/Anticoagulation: Subcutaneous heparin 3. Pain Management:Tylenol as needed 4. Mood:Provide emotional support 5. Neuropsych: This patientis? capable of making decisions on hisown behalf. 6. Skin/Wound Care:Routine skin checks 7. 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 Procedure based on timeframe of rehab services 9.Hyperlipidemia. Lipitor 10.   Hypertension  Lisinopril 2.5 mg daily Vitals:   02/24/18 1949 02/25/18 0501  BP: 107/64 124/73  Pulse: 92 72  Resp: 18 18  Temp: 98 F (36.7 C) 98 F (36.7 C)  SpO2: 97% 97%  good control 6/7- some lower reading monitor for trend  11. anemia-resolved  Hb 12.6 on 5/31  Recheck 6/3 13.5 12. Postoperative dysphagia  D1 nectar liquids- I yesterday  Advance diet as tolerated 13.  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

## 2018-02-26 ENCOUNTER — Inpatient Hospital Stay (HOSPITAL_COMMUNITY): Payer: Medicare Other | Admitting: Physical Therapy

## 2018-02-26 NOTE — Plan of Care (Signed)
  Problem: RH BLADDER ELIMINATION Goal: RH STG MANAGE BLADDER WITH ASSISTANCE Description STG Manage Bladder With mod Assistance  Outcome: Not Progressing; incontinence   Problem: RH KNOWLEDGE DEFICIT Goal: RH STG INCREASE KNOWLEDGE OF DYSPHAGIA/FLUID INTAKE Description Patient Jorge Burns/family will be able understand and increase knowledge of dysphagia/fluid intake using cues/resources independently   Outcome: Not Progressing; language barrier

## 2018-02-26 NOTE — Progress Notes (Signed)
Physical Therapy Session Note  Patient Details  Name: Jorge Burns MRN: [id number] Date of Birth: [birth year]  Today's Date: 02/26/2018 PT Individual Time: 0900-0957 PT Individual Time Calculation (min): 57 min   Short Term Goals: Week 1:  PT Short Term Goal 1 (Week 1): Pt will initiate gait training PT Short Term Goal 1 - Progress (Week 1): Met PT Short Term Goal 2 (Week 1): Pt will transfer bed<>chair w/ mod assist PT Short Term Goal 2 - Progress (Week 1): Met PT Short Term Goal 3 (Week 1): Pt will tolerate 30 min of OOB activity w/o increase in fatigue PT Short Term Goal 3 - Progress (Week 1): Met PT Short Term Goal 4 (Week 1): Pt will maintain static standing balance w/ min assist  PT Short Term Goal 4 - Progress (Week 1): Not met PT Short Term Goal 5 (Week 1): Pt will follow verbal 1-step commands 50% of the time PT Short Term Goal 5 - Progress (Week 1): Progressing toward goal  Skilled Therapeutic Interventions/Progress Updates:   Pt in supine and agreeable to therapy, no c/o pain or evidence of pain. Interpreter present throughout session. Focused on independence w/ functional tasks first part of session including dressing, brushing teeth, and shaving. Transferred to EOB w/ min assist and provided total assist to thread pants, however pt initiated task w/o prompting. Stood w/ max assist and total assist to pull pants over hips and then transferred to w/c. Pt brushed teeth at sink w/ set-up assist and mod verbal, visual, and manual hand-over-hand cues for technique, attention to task, R body awareness, and sequencing. Pt also shaved face w/ max assist overall, pt performed approx 40% of shaving w/o manual assist, otherwise hand-over-hand assist to complete the rest, especially R side of face. Max verbal and visual cues for technique, sequencing, safety, and attention to task. Pt cut himself one spot below lip, bandage applied after shaving, no signs or excessive bleeding or pain. Pt  self-propelled w/c to day room w/ supervision using R hemi technique, much improved from previous sessions, however still required max visual and verbal cues for attention to R environment. Performed NuStep 5 min @ level 4 for RLE NMR and reciprocal movement pattern, total manual assist to maintain neutral RLE alignment. Returned to room and ended session in w/c, call bell within reach and all needs met. Quick release belt and chair alarm engaged.   Therapy Documentation Precautions:  Precautions Precautions: Fall Precaution Comments: non-English speaking Restrictions Weight Bearing Restrictions: No  See Function Navigator for Current Functional Status.   Therapy/Group: Individual Therapy  Jorge Burns 02/26/2018, 9:59 AM

## 2018-02-26 NOTE — Progress Notes (Signed)
[facility] PHYSICAL MEDICINE & REHABILITATION     PROGRESS NOTE  Subjective/Complaints:   Patient lying in bed.  No obvious issues overnight.  ROS: limited due to language/communication    Objective: Vital Signs: Blood pressure 118/73, pulse 81, temperature 97.8 F (36.6 C), temperature source Oral, resp. rate 16, weight 58 kg (127 lb 13.9 oz), SpO2 95 %. No results found. No results for input(s): WBC, HGB, HCT, PLT in the last 72 hours. No results for input(s): NA, K, CL, GLUCOSE, BUN, CREATININE, CALCIUM in the last 72 hours.  Invalid input(s): CO CBG (last 3)  No results for input(s): GLUCAP in the last 72 hours.  Wt Readings from Last 3 Encounters:  02/23/18 58 kg (127 lb 13.9 oz)  02/13/18 57.2 kg (126 lb 1.7 oz)  02/11/18 63.5 kg (140 lb)    Physical Exam:  BP 118/73 (BP Location: Left Arm)   Pulse 81   Temp 97.8 F (36.6 C) (Oral)   Resp 16   Wt 58 kg (127 lb 13.9 oz)   SpO2 95%   BMI 20.64 kg/m  Constitutional: No distress . Vital signs reviewed. HEENT: EOMI, oral membranes moist Neck: supple Cardiovascular: RRR without murmur. No JVD    Respiratory: CTA Bilaterally without wheezes or rales. Normal effort    GI: BS +, non-tender, non-distended  Musculoskeletal:No edema.  No tenderness. Neurological: Non-English-speaking [place] male.  ?aphasia + language.  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  Spontaneously moving LUE/LLE  Skin: warm and dry. Intact. Psychiatric:Flat  Assessment/Plan: 1. Functional deficits secondary to multifocal bilateral infarcts which require 3+ hours per day of interdisciplinary therapy in a comprehensive inpatient rehab setting. Physiatrist is providing close team supervision and 24 hour management of active medical problems listed below. Physiatrist and rehab team continue to assess barriers to discharge/monitor patient progress toward functional and medical  goals.  Function:  Bathing Bathing position   Position: Wheelchair/chair at sink  Bathing parts Body parts bathed by patient: Right arm, Chest, Abdomen, Front perineal area Body parts bathed by helper: Back, Left arm, Right upper leg, Left upper leg, Right lower leg, Left lower leg, Buttocks  Bathing assist Assist Level: Touching or steadying assistance(Pt > 75%)      Upper Body Dressing/Undressing Upper body dressing   What is the patient wearing?: Pull over shirt/dress     Pull over shirt/dress - Perfomed by patient: Thread/unthread left sleeve, Put head through opening Pull over shirt/dress - Perfomed by helper: Pull shirt over trunk, Thread/unthread right sleeve        Upper body assist Assist Level: Touching or steadying assistance(Pt > 75%)      Lower Body Dressing/Undressing Lower body dressing   What is the patient wearing?: Pants, Non-skid slipper socks   Underwear - Performed by helper: Thread/unthread right underwear leg, Thread/unthread left underwear leg, Pull underwear up/down Pants- Performed by patient: Thread/unthread left pants leg Pants- Performed by helper: Thread/unthread right pants leg, Pull pants up/down   Non-skid slipper socks- Performed by helper: Don/doff right sock, Don/doff left sock                  Lower body assist Assist for lower body dressing: Touching or steadying assistance (Pt > 75%)      Toileting Toileting Toileting activity did not occur: No continent bowel/bladder event Toileting steps completed by patient: Adjust clothing after toileting Toileting steps completed by helper: Adjust clothing prior to toileting, Performs perineal hygiene, Adjust clothing after  toileting    Toileting assist Assist level: Touching or steadying assistance (Pt.75%)   Transfers Chair/bed transfer   Chair/bed transfer method: Squat pivot Chair/bed transfer assist level: Maximal assist (Pt 25 - 49%/lift and lower) Chair/bed transfer assistive  device: Armrests, Bedrails Mechanical lift: Stedy   Locomotion Ambulation Ambulation activity did not occur: Safety/medical concerns   Max distance: 30' Assist level: Maximal assist (Pt 25 - 49%)   Wheelchair   Type: Manual Max wheelchair distance: 150 ft Assist Level: Touching or steadying assistance (Pt > 75%)  Cognition Comprehension Comprehension assist level: Understands basic less than 25% of the time/ requires cueing >75% of the time, Other (comment)  Expression Expression assist level: Expresses basis less than 25% of the time/requires cueing >75% of the time.  Social Interaction Social Interaction assist level: Interacts appropriately less than 25% of the time. May be withdrawn or combative.  Problem Solving Problem solving assist level: Solves basic less than 25% of the time - needs direction nearly all the time or does not effectively solve problems and may need a restraint for safety  Memory Memory assist level: Recognizes or recalls less than 25% of the time/requires cueing greater than 75% of the time    Medical Problem List and Plan: 1.Right-sided weakness and slurred speechsecondary to multifocal acute ischemia within the left hemisphere, predominantly within the deep watershed zone and left ACA/MCA cortical watershed zone. Small subacute infarct within the right side white matter adjacent to the corpus callosum splenium on 5/26 On ASA/Plavix  Continue CIR PT, OT, SLP-  2. DVT Prophylaxis/Anticoagulation: Subcutaneous heparin 3. Pain Management:Tylenol as needed 4. Mood:Provide emotional support 5. Neuropsych: This patientis? capable of making decisions on hisown behalf. 6. Skin/Wound Care:Routine skin checks 7. 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 Procedure based on timeframe of  rehab services 9.Hyperlipidemia. Lipitor 10.  Hypertension  Lisinopril 2.5 mg daily Vitals:   02/25/18 1346 02/26/18 0554  BP: 117/73 118/73  Pulse: 89 81  Resp:  16  Temp: (!) 97.5 F (36.4 C) 97.8 F (36.6 C)  SpO2: 100% 95%  good control 6/8   11. anemia-resolved  Hb 12.6 on 5/31  Recheck 6/3 13.5 12. Postoperative dysphagia  D1 nectar liquids-continue to push p.o.  Advance diet as tolerated 13.  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

## 2018-02-27 ENCOUNTER — Inpatient Hospital Stay (HOSPITAL_COMMUNITY): Payer: Medicare Other | Admitting: Physical Therapy

## 2018-02-27 NOTE — Progress Notes (Signed)
Physical Therapy Session Note  Patient Details  Name: Jorge Burns MRN: [id number] Date of Birth: 07/09/1941  Today's Date: 02/27/2018 PT Individual Time: 0900-1000 PT Individual Time Calculation (min): 60 min   Short Term Goals: Week 2:  PT Short Term Goal 1 (Week 2): Pt will maintain dynamic sitting balance w/ supervision PT Short Term Goal 2 (Week 2): Pt will attend to R environment during functional mobility w/ min cues 50% of the time PT Short Term Goal 3 (Week 2): Pt will ambulate 15' w/ mod assist w/ LRAD PT Short Term Goal 4 (Week 2): Pt will follow verbal commands during functional mobility 50% of the time PT Short Term Goal 5 (Week 2): Pt will self-propel w/c 150' w/ supervision in controlled environment  Skilled Therapeutic Interventions/Progress Updates: Pt received supine in bed with family present; granddaughter assisted with interpreting throughout session. Shorts donned in supine with pt assisting pulling pants over L hip, requiring totalA for remaining steps. Supine>sit minA with bedrails. ModA sitting balance while donning shirt; pt threads LUE and head first despite cueing to lead with RUE. Stand pivot modA to L into w/c. Pt given washcloths to wash face at sink; brushes dry washcloth over face, requires max cues to wet washcloth and then perform again. Oral care performed with setupA using suction. W/c propulsion minA>S with L hemi technique and max cues for technique to steer with LLE. Squat pivot w/c<>mat to R side minA. Straddled on low bench performed LUE repetitive reaching to L to retrieve beanbags from granddaughter for facilitation of R trunk shortening, neutral pelvic alignment and midline orientation with decreased reactivity to moving BOS over L side. On mat, repetitive LUE elbow propped sidelying x6 reps with hold ~30 sec, S to return to sitting midline, requires maxA to achieve position d/t attempted pushing with L hand. Sitting LUE reaching to L side while wedge under R  hip for neutral pelvis; S for balance overall in sitting and improved trunk alignment noted. Transitioned into standing with LUE reaching to L side; requires increased cueing/encouragement to perform. Towel roll added under R side of cushion to facilitate neutral pelvis/trunk length in sitting. Returned to w/c as above; transferred to room totalA. Remained seated in w/c with chair alarm intact, family present and all needs in reach.                                                                                                     Therapy Documentation Precautions:  Precautions Precautions: Fall Precaution Comments: non-English speaking Restrictions Weight Bearing Restrictions: No   See Function Navigator for Current Functional Status.   Therapy/Group: Individual Therapy  Vista Lawmanlizabeth J Tygielski 02/27/2018, 10:01 AM

## 2018-02-27 NOTE — Progress Notes (Signed)
[place] PHYSICAL MEDICINE & REHABILITATION     PROGRESS NOTE  Subjective/Complaints:   No new problems.  Patient had good night per nurse.  Still incontinent  ROS: Limited due to cognitive/behavioral    Objective: Vital Signs: Blood pressure 125/76, pulse 81, temperature 97.7 F (36.5 C), temperature source Oral, resp. rate 17, weight 58 kg (127 lb 13.9 oz), SpO2 97 %. No results found. No results for input(s): WBC, HGB, HCT, PLT in the last 72 hours. No results for input(s): NA, K, CL, GLUCOSE, BUN, CREATININE, CALCIUM in the last 72 hours.  Invalid input(s): CO CBG (last 3)  No results for input(s): GLUCAP in the last 72 hours.  Wt Readings from Last 3 Encounters:  02/23/18 58 kg (127 lb 13.9 oz)  02/13/18 57.2 kg (126 lb 1.7 oz)  02/11/18 63.5 kg (140 lb)    Physical Exam:  BP 125/76 (BP Location: Left Arm)   Pulse 81   Temp 97.7 F (36.5 C) (Oral)   Resp 17   Wt 58 kg (127 lb 13.9 oz)   SpO2 97%   BMI 20.64 kg/m  Constitutional: No distress . Vital signs reviewed. HEENT: EOMI, oral membranes moist Neck: supple Cardiovascular: RRR without murmur. No JVD    Respiratory: CTA Bilaterally without wheezes or rales. Normal effort    GI: BS +, non-tender, non-distended  Musculoskeletal:No edema.  No tenderness. Neurological: Non-English-speaking [place] male.  ?aphasia + language.  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  Spontaneously moving LUE/LLE  Skin: warm and dry. Intact. Psychiatric:Flat  Assessment/Plan: 1. Functional deficits secondary to multifocal bilateral infarcts which require 3+ hours per day of interdisciplinary therapy in a comprehensive inpatient rehab setting. Physiatrist is providing close team supervision and 24 hour management of active medical problems listed below. Physiatrist and rehab team continue to assess barriers to discharge/monitor patient progress toward functional and medical  goals.  Function:  Bathing Bathing position   Position: Wheelchair/chair at sink  Bathing parts Body parts bathed by patient: Right arm, Chest, Abdomen, Front perineal area Body parts bathed by helper: Back, Left arm, Right upper leg, Left upper leg, Right lower leg, Left lower leg, Buttocks  Bathing assist Assist Level: Touching or steadying assistance(Pt > 75%)      Upper Body Dressing/Undressing Upper body dressing   What is the patient wearing?: Pull over shirt/dress     Pull over shirt/dress - Perfomed by patient: Thread/unthread left sleeve, Put head through opening Pull over shirt/dress - Perfomed by helper: Pull shirt over trunk, Thread/unthread right sleeve        Upper body assist Assist Level: Touching or steadying assistance(Pt > 75%)      Lower Body Dressing/Undressing Lower body dressing   What is the patient wearing?: Pants, Non-skid slipper socks   Underwear - Performed by helper: Thread/unthread right underwear leg, Thread/unthread left underwear leg, Pull underwear up/down Pants- Performed by patient: Thread/unthread left pants leg Pants- Performed by helper: Thread/unthread right pants leg, Pull pants up/down   Non-skid slipper socks- Performed by helper: Don/doff right sock, Don/doff left sock                  Lower body assist Assist for lower body dressing: Touching or steadying assistance (Pt > 75%)      Toileting Toileting Toileting activity did not occur: No continent bowel/bladder event Toileting steps completed by patient: Adjust clothing after toileting Toileting steps completed by helper: Adjust clothing prior to toileting, Performs perineal hygiene,  Adjust clothing after toileting    Toileting assist Assist level: Touching or steadying assistance (Pt.75%)   Transfers Chair/bed transfer   Chair/bed transfer method: Squat pivot Chair/bed transfer assist level: Maximal assist (Pt 25 - 49%/lift and lower) Chair/bed transfer assistive  device: Armrests, Bedrails Mechanical lift: Stedy   Locomotion Ambulation Ambulation activity did not occur: Safety/medical concerns   Max distance: 30' Assist level: Maximal assist (Pt 25 - 49%)   Wheelchair   Type: Manual Max wheelchair distance: 100' Assist Level: Supervision or verbal cues  Cognition Comprehension Comprehension assist level: Understands basic less than 25% of the time/ requires cueing >75% of the time, Other (comment)  Expression Expression assist level: Expresses basis less than 25% of the time/requires cueing >75% of the time.  Social Interaction Social Interaction assist level: Interacts appropriately less than 25% of the time. May be withdrawn or combative.  Problem Solving Problem solving assist level: Solves basic less than 25% of the time - needs direction nearly all the time or does not effectively solve problems and may need a restraint for safety  Memory Memory assist level: Recognizes or recalls less than 25% of the time/requires cueing greater than 75% of the time    Medical Problem List and Plan: 1.Right-sided weakness and slurred speechsecondary to multifocal acute ischemia within the left hemisphere, predominantly within the deep watershed zone and left ACA/MCA cortical watershed zone. Small subacute infarct within the right side white matter adjacent to the corpus callosum splenium on 5/26 On ASA/Plavix  Continue CIR PT, OT, SLP-  2. DVT Prophylaxis/Anticoagulation: Subcutaneous heparin 3. Pain Management:Tylenol as needed 4. Mood:Provide emotional support 5. Neuropsych: This patientis? capable of making decisions on hisown behalf. 6. Skin/Wound Care:Routine skin checks 7. Fluids/Electrolytes/Nutrition:Routine in and outs  BMP within acceptable range on 6/3 Continue to encourage PO, I 720 ml recorded with meals yesterday 8.ICA stenosis.Follow up IR for possibleICA revascularization. Procedure based on timeframe of rehab  services 9.Hyperlipidemia. Lipitor 10.  Hypertension  Lisinopril 2.5 mg daily Vitals:   02/26/18 2100 02/27/18 0523  BP: 128/62 125/76  Pulse: 75 81  Resp: 18 17  Temp: 97.8 F (36.6 C) 97.7 F (36.5 C)  SpO2: 97% 97%  good control 6/9   11. anemia-resolved  Hb 12.6 on 5/31  Recheck 6/3 13.5 12. Postoperative dysphagia  D1 nectar liquids-continue to push p.o.  Advance diet as tolerated 13.  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

## 2018-02-27 NOTE — Plan of Care (Signed)
  Problem: RH BOWEL ELIMINATION Goal: RH STG MANAGE BOWEL WITH ASSISTANCE Description: STG Manage Bowel with mod Assistance. Outcome: Not Progressing; incontinence   Problem: RH BLADDER ELIMINATION Goal: RH STG MANAGE BLADDER WITH ASSISTANCE Description: STG Manage Bladder With mod Assistance Outcome: Not Progressing; incontinence   

## 2018-02-28 ENCOUNTER — Inpatient Hospital Stay (HOSPITAL_COMMUNITY): Payer: Medicare Other

## 2018-02-28 ENCOUNTER — Inpatient Hospital Stay (HOSPITAL_COMMUNITY): Payer: Medicare Other | Admitting: Speech Pathology

## 2018-02-28 ENCOUNTER — Encounter (HOSPITAL_COMMUNITY): Payer: Self-pay | Admitting: Emergency Medicine

## 2018-02-28 ENCOUNTER — Inpatient Hospital Stay (HOSPITAL_COMMUNITY): Payer: Medicare Other | Admitting: Occupational Therapy

## 2018-02-28 NOTE — Progress Notes (Signed)
Speech Language Pathology Daily Session Note  Patient Details  Name: Jorge Burns MRN: [id number] Date of Birth: 03/06/1941  Today's Date: 02/28/2018 SLP Individual Time: 1300-1326 SLP Individual Time Calculation (min): 26 min  Short Term Goals: Week 2: SLP Short Term Goal 1 (Week 2): Pt will convey wants/needs with use of multimodal communication ( gestures, facial expressions) with Max A multimodal cues.  SLP Short Term Goal 2 (Week 2): Patient will vocalize at the CV level in 50% of opportunities with Max A multimodal cues.  SLP Short Term Goal 3 (Week 2): Pt will follow 1 step functional directions related to self with 75% accuracy and Mod A multimodal cues SLP Short Term Goal 4 (Week 2): Patient will identify functional items from a field of 2 with 50% accuracy and Max A multimodal cues.  SLP Short Term Goal 5 (Week 2): Pt will consume trials of ice chips/thin liquids via TSP  with minimal overt s/s aspiration and Mod A verbal cues over 2 sessions to demonstrate readiness for repeat MBS.  SLP Short Term Goal 6 (Week 2): Pt will demonstrate efficient mastication and complete oral clearance of dys 2 texture trials without overt s/s aspiration and Min A verbal cues over 2 sessions prior to upgrade.   Skilled Therapeutic Interventions: Skilled treatment session focused on dysphagia goals. Upon arrival, patient was sitting upright in the wheelchair without an interpreter. SLP facilitated session by providing Min A visual cues for problem solving during oral care via the suction toothbrush. Patient consumed trials of ice chips without overt s/s of aspiration but demonstrated intermittent right anterior spillage of trials. Recommend continued trials with SLP. Patient left upright in wheelchair with all needs within reach and alarm on. Continue with current plan of care.      Function:  Eating Eating   Modified Consistency Diet: Yes Eating Assist Level: Supervision or verbal cues;Set up assist for   Eating Set Up Assist For: Opening containers       Cognition Comprehension Comprehension assist level: Understands basic less than 25% of the time/ requires cueing >75% of the time;Other (comment)  Expression   Expression assist level: Expresses basis less than 25% of the time/requires cueing >75% of the time.  Social Interaction Social Interaction assist level: Interacts appropriately less than 25% of the time. May be withdrawn or combative.  Problem Solving Problem solving assist level: Solves basic less than 25% of the time - needs direction nearly all the time or does not effectively solve problems and may need a restraint for safety  Memory Memory assist level: Recognizes or recalls less than 25% of the time/requires cueing greater than 75% of the time    Pain No/Denies Pain    Therapy/Group: Individual Therapy  Rosalea Withrow 02/28/2018, 3:00 PM

## 2018-02-28 NOTE — Plan of Care (Signed)
  Problem: RH KNOWLEDGE DEFICIT Goal: RH STG INCREASE KNOWLEDGE OF DYSPHAGIA/FLUID INTAKE Description Patient Meryle Ready/family will be able understand and increase knowledge of dysphagia/fluid intake using cues/resources independently   Outcome: Progressing  Language is barrier, daughter and interpreter assist with communication

## 2018-02-28 NOTE — Progress Notes (Signed)
Physical Therapy Session Note  Patient Details  Name: Jorge Burns MRN: [id number] Date of Birth: 04/02/1941  Today's Date: 02/28/2018 PT Individual Time: 1000-1059 and 1330-1412 PT Individual Time Calculation (min): 59 min and 42 min   Short Term Goals: Week 2:  PT Short Term Goal 1 (Week 2): Pt will maintain dynamic sitting balance w/ supervision PT Short Term Goal 2 (Week 2): Pt will attend to R environment during functional mobility w/ min cues 50% of the time PT Short Term Goal 3 (Week 2): Pt will ambulate 15' w/ mod assist w/ LRAD PT Short Term Goal 4 (Week 2): Pt will follow verbal commands during functional mobility 50% of the time PT Short Term Goal 5 (Week 2): Pt will self-propel w/c 150' w/ supervision in controlled environment  Skilled Therapeutic Interventions/Progress Updates:    Session 1: Pt seated in w/c upon PT arrival, agreeable to therapy tx and denies pain. Pt transported to the gym. Pt performed 2 x 5 sit<>stands from w/c with mirror for visual feedback working on anterior weightshift, symmetric LE weightbearing and midline. Pt worked on anterior weightshift in sitting to pick bean bags up off the floor, x 2 trials for neuro re-ed. Pt worked on postural control and standing balance reaching for bean bags outside base of support without UE support, pt initially requiring max assist for standing balance secondary to posterior lean, able to stand with min-mod assist when over correcting posterior lean with reaching anteriorly, x 3 trials. Pt performed sit<>stands facing the railing, improved posterior lean with wall/rail in front of pt, working on standing balance without UE support to reach overhead for clothespins, hand over hand assist to reach/grasp with L UE, x 2 trials. Pt granddaughter present throughout session providing translation, therapist also educating on pts current status and progress. Pt left seated with QRB in place and chair alarm set, needs in reach.    Session  2: Pt seated in w/c upon PT arrival, no interpreter present this session. Pt transported to the dayroom. Pt performed squat pivot transfer from w/c>nustep mod assist. Pt used nustep x7 minutes on workload 4 using B UEs/LEs for neuro re-ed and reciprocal movement, hand attachment used for R UE, therapist using theraband to prevent R LE adduction. Pt transferred back to w/c with mod assist. Pt used standing frame x 10 minutes working on postural control and maintaining midline while reaching outside BoS for small blocks. Pt transported back to room and transferred back to bed with mod assist and use of bedrail. Pt transferred to supine with mod assist for LE management, left supine in bed with needs in reach and bed alarm set.     Therapy Documentation Precautions:  Precautions Precautions: Fall Precaution Comments: non-English speaking Restrictions Weight Bearing Restrictions: No   See Function Navigator for Current Functional Status.   Therapy/Group: Individual Therapy  Cresenciano GenreEmily van Schagen, PT, DPT 02/28/2018, 7:51 AM

## 2018-02-28 NOTE — Progress Notes (Signed)
Occupational Therapy Session Note  Patient Details  Name: Jorge Burns MRN: [id number] Date of Birth: 06/16/1941  Today's Date: 02/28/2018 OT Individual Time: 1000-1100 OT Individual Time Calculation (min): 60 min   Short Term Goals: Week 2:  OT Short Term Goal 1 (Week 2): Pt will sit EOM for 5 min with supervision during functional task OT Short Term Goal 2 (Week 2): [facility] A [facility] 1 and LRAD OT Short Term Goal 3 (Week 2): Pt will sit to stand with Mod A for balance in prep for clothing managmnet OT Short Term Goal 4 (Week 2): Pt will locate 2 items on R of sink wiht min VC  Skilled Therapeutic Interventions/Progress Updates:    Pt greeted semi-reclined in bed with granddaughter present.  Granddaughter helped interpret through session as no interpreter present. Pt came to sitting EOB with cues and max A to advance R side. Stand-pivot to wc on R side with max A and total facilitation to elicit pivot. Pt with pushing to the R and poor trunk control. Stand-pivot to toilet with max A with more pushing. OT unable to stand pt with max/total A and pull down brief for toileting so Stedy used for sit<>stand with max A so OT could manage clothing. Pt with successful BM and bladder. Total A peri-care and clothing management. Stedy used to then transfer into shower with max A lift and lower. Verbal and tactile cues for sitting balance during bathing tasks to decrease pushing to R. Hand over hand A to integrate R UE into bathing tasks for neuro re-ed. Facilitated anterior weight shift to decrease pushing by having pt reach to wash feet. Pt with improved trunk control noted. Stedy transfer out of shower with mod A to stand. Pt then completed 3 more sit<>stands within dressing tasks with only min A from wc height. Pt left seated in wc at end of session with safety belt, chair alarm, and granddaughter present.   Therapy Documentation Precautions:  Precautions Precautions: Fall Precaution  Comments: non-English speaking Restrictions Weight Bearing Restrictions: No Pain: None/denies pain See Function Navigator for Current Functional Status.   Therapy/Group: Individual Therapy  Mal Amabilelisabeth S Michiah Masse 02/28/2018, 9:25 AM

## 2018-02-28 NOTE — Progress Notes (Signed)
[place] PHYSICAL MEDICINE & REHABILITATION     PROGRESS NOTE  Subjective/Complaints:   Aphasic with language barrier as well although SLP note no improved communication with interpreter present  ROS: Limited due to cognitive/behavioral    Objective: Vital Signs: Blood pressure 122/66, pulse 92, temperature 97.7 F (36.5 C), temperature source Oral, resp. rate 15, weight 55.8 kg (123 lb 0.3 oz), SpO2 97 %. No results found. No results for input(s): WBC, HGB, HCT, PLT in the last 72 hours. No results for input(s): NA, K, CL, GLUCOSE, BUN, CREATININE, CALCIUM in the last 72 hours.  Invalid input(s): CO CBG (last 3)  No results for input(s): GLUCAP in the last 72 hours.  Wt Readings from Last 3 Encounters:  02/28/18 55.8 kg (123 lb 0.3 oz)  02/13/18 57.2 kg (126 lb 1.7 oz)  02/11/18 63.5 kg (140 lb)    Physical Exam:  BP 122/66 (BP Location: Left Arm)   Pulse 92   Temp 97.7 F (36.5 C) (Oral)   Resp 15   Wt 55.8 kg (123 lb 0.3 oz)   SpO2 97%   BMI 19.86 kg/m  Constitutional: No distress . Vital signs reviewed. HEENT: EOMI, oral membranes moist Neck: supple Cardiovascular: RRR without murmur. No JVD    Respiratory: CTA Bilaterally without wheezes or rales. Normal effort    GI: BS +, non-tender, non-distended  Musculoskeletal:No edema.  No tenderness. Neurological: Non-English-speaking [place] male.  ?aphasia + language.  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  Spontaneously moving LUE/LLE  Skin: warm and dry. Intact. Psychiatric:Flat  Assessment/Plan: 1. Functional deficits secondary to multifocal bilateral infarcts which require 3+ hours per day of interdisciplinary therapy in a comprehensive inpatient rehab setting. Physiatrist is providing close team supervision and 24 hour management of active medical problems listed below. Physiatrist and rehab team continue to assess barriers to discharge/monitor patient progress toward  functional and medical goals.  Function:  Bathing Bathing position   Position: Wheelchair/chair at sink  Bathing parts Body parts bathed by patient: Right arm, Chest, Abdomen, Front perineal area Body parts bathed by helper: Back, Left arm, Right upper leg, Left upper leg, Right lower leg, Left lower leg, Buttocks  Bathing assist Assist Level: Touching or steadying assistance(Pt > 75%)      Upper Body Dressing/Undressing Upper body dressing   What is the patient wearing?: Pull over shirt/dress     Pull over shirt/dress - Perfomed by patient: Thread/unthread left sleeve, Put head through opening, Pull shirt over trunk Pull over shirt/dress - Perfomed by helper: Thread/unthread right sleeve        Upper body assist Assist Level: Touching or steadying assistance(Pt > 75%)      Lower Body Dressing/Undressing Lower body dressing   What is the patient wearing?: Pants   Underwear - Performed by helper: Thread/unthread right underwear leg, Thread/unthread left underwear leg, Pull underwear up/down Pants- Performed by patient: Thread/unthread left pants leg Pants- Performed by helper: Thread/unthread right pants leg, Thread/unthread left pants leg, Pull pants up/down   Non-skid slipper socks- Performed by helper: Don/doff right sock, Don/doff left sock                  Lower body assist Assist for lower body dressing: Touching or steadying assistance (Pt > 75%)      Toileting Toileting Toileting activity did not occur: No continent bowel/bladder event Toileting steps completed by patient: Adjust clothing after toileting Toileting steps completed by helper: Adjust clothing prior to toileting,  Performs perineal hygiene, Adjust clothing after toileting    Toileting assist Assist level: Touching or steadying assistance (Pt.75%)   Transfers Chair/bed transfer   Chair/bed transfer method: Squat pivot Chair/bed transfer assist level: Moderate assist (Pt 50 - 74%/lift or  lower) Chair/bed transfer assistive device: Armrests, Bedrails Mechanical lift: Stedy   Locomotion Ambulation Ambulation activity did not occur: Safety/medical concerns   Max distance: 25 Assist level: 2 helpers(maxA, w/c follow)   Wheelchair   Type: Manual Max wheelchair distance: 150 Assist Level: Touching or steadying assistance (Pt > 75%)  Cognition Comprehension Comprehension assist level: Understands basic less than 25% of the time/ requires cueing >75% of the time, Other (comment)  Expression Expression assist level: Expresses basis less than 25% of the time/requires cueing >75% of the time.  Social Interaction Social Interaction assist level: Interacts appropriately less than 25% of the time. May be withdrawn or combative.  Problem Solving Problem solving assist level: Solves basic less than 25% of the time - needs direction nearly all the time or does not effectively solve problems and may need a restraint for safety  Memory Memory assist level: Recognizes or recalls less than 25% of the time/requires cueing greater than 75% of the time    Medical Problem List and Plan: 1.Right-sided weakness and slurred speechsecondary to multifocal acute ischemia within the left hemisphere, predominantly within the deep watershed zone and left ACA/MCA cortical watershed zone. Small subacute infarct within the right side white matter adjacent to the corpus callosum splenium on 5/26 On ASA/Plavix  Continue CIR PT, OT, SLP-  2. DVT Prophylaxis/Anticoagulation: Subcutaneous heparin 3. Pain Management:Tylenol as needed 4. Mood:Provide emotional support 5. Neuropsych: This patientis? capable of making decisions on hisown behalf. 6. Skin/Wound Care:Routine skin checks 7. Fluids/Electrolytes/Nutrition:Routine in and outs  BMP within acceptable range on 6/3 Continue to encourage PO, I 480 ml recorded with meals yesterday 8.ICA stenosis.Follow up IR for possibleICA  revascularization. Procedure based on timeframe of rehab services 9.Hyperlipidemia. Lipitor 10.  Hypertension  Lisinopril 2.5 mg daily Vitals:   02/27/18 2032 02/28/18 0547  BP: (!) 122/58 122/66  Pulse: 87 92  Resp: 15 15  Temp: 98.5 F (36.9 C) 97.7 F (36.5 C)  SpO2: 94% 97%  good control 6/10  11. anemia-resolved  Hb 12.6 on 5/31  Recheck 6/3 13.5 12. Postoperative dysphagia  D1 nectar liquids-continue to push p.o.  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

## 2018-03-01 ENCOUNTER — Inpatient Hospital Stay (HOSPITAL_COMMUNITY): Payer: Medicare Other | Admitting: Speech Pathology

## 2018-03-01 ENCOUNTER — Inpatient Hospital Stay (HOSPITAL_COMMUNITY): Payer: Medicare Other | Admitting: Physical Therapy

## 2018-03-01 ENCOUNTER — Inpatient Hospital Stay (HOSPITAL_COMMUNITY): Payer: Medicare Other

## 2018-03-01 ENCOUNTER — Inpatient Hospital Stay (HOSPITAL_COMMUNITY): Payer: Medicare Other | Admitting: Occupational Therapy

## 2018-03-01 NOTE — Progress Notes (Signed)
Occupational Therapy Session Note  Patient Details  Name: Jorge Burns MRN: [id number] Date of Birth: 12/30/1940  Today's Date: 03/01/2018 OT Individual Time: 1330-1415 OT Individual Time Calculation (min): 45 min    Short Term Goals: Week 2:  OT Short Term Goal 1 (Week 2): Pt will sit EOM for 5 min with supervision during functional task OT Short Term Goal 2 (Week 2): [facility] MOD A of 1 and LRAD OT Short Term Goal 3 (Week 2): Pt will sit to stand with Mod A for balance in prep for clothing managmnet OT Short Term Goal 4 (Week 2): Pt will locate 2 items on R of sink wiht min VC  Skilled Therapeutic Interventions/Progress Updates:    Pt received sitting up in w/c with interpreter present. No pain observed via faces or gestures throughout session. Session focused on functional communication, visual scanning, and grooming tasks. Pt was set up with suction toothbrush and handed with no vc for function- pt able to take toothbrush and use appropriately, requiring min A only for thoroughness. Pt was transported outside and given various yes-no questions to encourage functional interaction with the environment and others. Pt unable to verbalize or make any meaningful gestures. Pt smiling more this session and interpreter stated that perhaps the past interpreter spoke the incorrect dialect of his native language and that he is understanding more now. Pt given vc for visual scanning and requiring manual facilitation at the head to fully scan to his R. Pt returned inside and completed shaving task at sink with mod A for safety and thoroughness. Pt returned to bed, with max A for stand pivot transfer. Pt left supine with bed alarm set.   Therapy Documentation Precautions:  Precautions Precautions: Fall Precaution Comments: non-English speaking Restrictions Weight Bearing Restrictions: No Vital Signs: Therapy Vitals BP: 112/65 Pain: Pain Assessment Pain Score: 0-No pain  See  Function Navigator for Current Functional Status.   Therapy/Group: Individual Therapy  Crissie ReeseSandra H Laree Garron 03/01/2018, 12:14 PM

## 2018-03-01 NOTE — Progress Notes (Signed)
Occupational Therapy Session Note  Patient Details  Name: Jorge Burns MRN: [id number] Date of Birth: 06/13/1941  Today's Date: 03/01/2018 OT Individual Time: [id number] OT Individual Time Calculation (min): 55 min    Short Term Goals: Week 2:  OT Short Term Goal 1 (Week 2): Pt will sit EOM for 5 min with supervision during functional task OT Short Term Goal 2 (Week 2): [facility] MOD A of 1 and LRAD OT Short Term Goal 3 (Week 2): Pt will sit to stand with Mod A for balance in prep for clothing managmnet OT Short Term Goal 4 (Week 2): Pt will locate 2 items on R of sink wiht min VC  Skilled Therapeutic Interventions/Progress Updates:    Pt greeted semi-reclined in bed, no interpreter present throughout session, but able to use gestures and tactile cues within familiar tasks to communicate with pt. Pt came to sitting EOB with assistance to manage R side of body. Stand-pivot to wc on L with mod A to power up and max A to facilitate pivot. Bathing/dressing completed wc at the sink with focus on sit<>stands, standing balance, and R attention. Overall mod/max A for sit<>stands with pt pulling up on the sink. Pt needed max A on 1st stand with OT blocking R knee to maintain balance with pushing to the R. Upon 2nd and 3rd stand, pt with improve trunk control and only needed mod A for balance for short time. OT assisted R LE into figure 4 position, then providing hand over hand A to cue pt to thread R LE. Pt then initiated threading LLE with min A for dynamic balance. R UE NMR with towel pushes on table, then worked on shoulder flex/ext and elbow flex/ext with joint input to bring pt through full ROM. Some increased tone noted but no activation. Pt left seated in wc with safety belt, chair alarm, and lap tray.   Therapy Documentation Precautions:  Precautions Precautions: Fall Precaution Comments: non-English speaking Restrictions Weight Bearing Restrictions: No Pain:  none/denies  pain See Function Navigator for Current Functional Status.   Therapy/Group: Individual Therapy  Mal Amabilelisabeth S Elysse Polidore 03/01/2018, 9:30 AM

## 2018-03-01 NOTE — Progress Notes (Signed)
[facility]IC[facility]IT[facility]     PROGRESS NOTE  Subjective/Complaints:   Non verbal/aphasic  ROS: Limited due to aphasia   Objective: Vital Signs: Blood pressure 123/75, pulse 80, temperature 97.8 F (36.6 C), temperature source Oral, resp. rate 16, weight 54 kg (119 lb 0.8 oz), SpO2 95 %. No results found. No results for input(s): WBC, HGB, HCT, PLT in the last 72 hours. No results for input(s): NA, K, CL, GLUCOSE, BUN, CREATININE, CALCIUM in the last 72 hours.  Invalid input(s): CO CBG (last 3)  No results for input(s): GLUCAP in the last 72 hours.  Wt Readings from Last 3 Encounters:  03/01/18 54 kg (119 lb 0.8 oz)  02/13/18 57.2 kg (126 lb 1.7 oz)  02/11/18 63.5 kg (140 lb)    Physical Exam:  BP 123/75 (BP Location: Left Arm)   Pulse 80   Temp 97.8 F (36.6 C) (Oral)   Resp 16   Wt 54 kg (119 lb 0.8 oz)   SpO2 95%   BMI 19.21 kg/m  Constitutional: No distress . Vital signs reviewed. HEENT: EOMI, oral membranes moist Neck: supple Cardiovascular: RRR without murmur. No JVD    Respiratory: CTA Bilaterally without wheezes or rales. Normal effort    GI: BS +, non-tender, non-distended  Musculoskeletal:No edema.  No tenderness. Neurological: Non-English-speaking [place] male.  ?aphasia + language.  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  Spontaneously moving LUE/LLE  Skin: warm and dry. Intact. Psychiatric:Flat  Assessment/Plan: 1. Functional deficits secondary to multifocal bilateral infarcts which require 3+ hours per day of interdisciplinary therapy in a comprehensive inpatient rehab setting. Physiatrist is providing close team supervision and 24 hour management of active medical problems listed below. Physiatrist and rehab team continue to assess barriers to discharge/monitor patient progress toward functional and medical goals.  Function:  Bathing Bathing position   Position: Shower  Bathing parts Body  parts bathed by patient: Right arm, Chest, Abdomen, Front perineal area, Right upper leg, Left upper leg, Left lower leg Body parts bathed by helper: Left arm, Buttocks, Left lower leg  Bathing assist Assist Level: Touching or steadying assistance(Pt > 75%)      Upper Body Dressing/Undressing Upper body dressing   What is the patient wearing?: Pull over shirt/dress     Pull over shirt/dress - Perfomed by patient: Thread/unthread left sleeve, Put head through opening Pull over shirt/dress - Perfomed by helper: Thread/unthread right sleeve, Pull shirt over trunk        Upper body assist Assist Level: Touching or steadying assistance(Pt > 75%)      Lower Body Dressing/Undressing Lower body dressing   What is the patient wearing?: Pants   Underwear - Performed by helper: Thread/unthread right underwear leg, Thread/unthread left underwear leg, Pull underwear up/down Pants- Performed by patient: Thread/unthread left pants leg Pants- Performed by helper: Thread/unthread right pants leg, Pull pants up/down Non-skid slipper socks- Performed by patient: Don/doff left sock Non-skid slipper socks- Performed by helper: Don/doff right sock                  Lower body assist Assist for lower body dressing: Touching or steadying assistance (Pt > 75%)      Toileting Toileting Toileting activity did not occur: No continent bowel/bladder event Toileting steps completed by patient: Adjust clothing after toileting Toileting steps completed by helper: Adjust clothing prior to toileting, Performs perineal hygiene, Adjust clothing after toileting    Toileting assist Assist level: Touching or steadying  assistance (Pt.75%)   Transfers Chair/bed transfer   Chair/bed transfer method: Squat pivot Chair/bed transfer assist level: Moderate assist (Pt 50 - 74%/lift or lower) Chair/bed transfer assistive device: Armrests, Bedrails Mechanical lift: Stedy   Locomotion Ambulation Ambulation activity  did not occur: Safety/medical concerns   Max distance: 25 Assist level: 2 helpers(maxA, w/c follow)   Wheelchair   Type: Manual Max wheelchair distance: 150 Assist Level: Touching or steadying assistance (Pt > 75%)  Cognition Comprehension Comprehension assist level: Understands basic less than 25% of the time/ requires cueing >75% of the time, Other (comment)  Expression Expression assist level: Expresses basis less than 25% of the time/requires cueing >75% of the time.  Social Interaction Social Interaction assist level: Interacts appropriately less than 25% of the time. May be withdrawn or combative.  Problem Solving Problem solving assist level: Solves basic less than 25% of the time - needs direction nearly all the time or does not effectively solve problems and may need a restraint for safety  Memory Memory assist level: Recognizes or recalls less than 25% of the time/requires cueing greater than 75% of the time    Medical Problem List and Plan: 1.Right-sided weakness and slurred speechsecondary to multifocal acute ischemia within the left hemisphere, predominantly within the deep watershed zone and left ACA/MCA cortical watershed zone. Small subacute infarct within the right side white matter adjacent to the corpus callosum splenium on 5/26 On ASA/Plavix  Continue CIR PT, OT, SLP-  2. DVT Prophylaxis/Anticoagulation: Subcutaneous heparin 3. Pain Management:Tylenol as needed 4. Mood:Provide emotional support 5. Neuropsych: This patientis? capable of making decisions on hisown behalf. 6. Skin/Wound Care:Routine skin checks 7. Fluids/Electrolytes/Nutrition:Routine in and outs  BMP within acceptable range on 6/3 Continue to encourage PO, I 480 ml recorded with meals yesterday 8.ICA stenosis.Follow up IR for possibleICA revascularization. Procedure based on timeframe of rehab services 9.Hyperlipidemia. Lipitor 10.  Hypertension  Lisinopril 2.5 mg  daily Vitals:   02/28/18 2234 03/01/18 0546  BP: 128/80 123/75  Pulse: 81 80  Resp: 17 16  Temp: (!) 97.5 F (36.4 C) 97.8 F (36.6 C)  SpO2: 94% 95%  good control 6/11  11. anemia-resolved  Hb 12.6 on 5/31  Recheck 6/3 13.5 12. Postoperative dysphagia  D1 nectar liquids-continue to push p.o.  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

## 2018-03-01 NOTE — Progress Notes (Signed)
Speech Language Pathology Daily Session Note  Patient Details  Name: Jorge Burns MRN: [id number] Date of Birth: 04/20/1941  Today's Date: 03/01/2018 SLP Individual Time: 1300-1330 SLP Individual Time Calculation (min): 30 min  Short Term Goals: Week 2: SLP Short Term Goal 1 (Week 2): Pt will convey wants/needs with use of multimodal communication ( gestures, facial expressions) with Max A multimodal cues.  SLP Short Term Goal 2 (Week 2): Patient will vocalize at the CV level in 50% of opportunities with Max A multimodal cues.  SLP Short Term Goal 3 (Week 2): Pt will follow 1 step functional directions related to self with 75% accuracy and Mod A multimodal cues SLP Short Term Goal 4 (Week 2): Patient will identify functional items from a field of 2 with 50% accuracy and Max A multimodal cues.  SLP Short Term Goal 5 (Week 2): Pt will consume trials of ice chips/thin liquids via TSP  with minimal overt s/s aspiration and Mod A verbal cues over 2 sessions to demonstrate readiness for repeat MBS.  SLP Short Term Goal 6 (Week 2): Pt will demonstrate efficient mastication and complete oral clearance of dys 2 texture trials without overt s/s aspiration and Min A verbal cues over 2 sessions prior to upgrade.   Skilled Therapeutic Interventions: Skilled treatment session focused on speech goals with interpreter present. SLP facilitated session by providing Max A verbal and visual cues for patient to identify functional items from a field of 2 with 50% accuracy. Patient able to produce a phoneme /m/ with Min A verbal cues and was able to produce /a/ for the first time today with Max A verbal cues, however, patient unable to verbalize at the CV level (ma) despite Max A multimodal cues. Patient left upright in wheelchair with all needs within reach. Continue with current plan of care.      Function:  Cognition Comprehension Comprehension assist level: Understands basic less than 25% of the time/ requires  cueing >75% of the time;Other (comment)  Expression   Expression assist level: Expresses basis less than 25% of the time/requires cueing >75% of the time.  Social Interaction Social Interaction assist level: Interacts appropriately less than 25% of the time. May be withdrawn or combative.  Problem Solving Problem solving assist level: Solves basic less than 25% of the time - needs direction nearly all the time or does not effectively solve problems and may need a restraint for safety  Memory Memory assist level: Recognizes or recalls less than 25% of the time/requires cueing greater than 75% of the time    Pain No/Denies Pain   Therapy/Group: Individual Therapy  Jeorgia Helming 03/01/2018, 2:23 PM

## 2018-03-01 NOTE — Progress Notes (Signed)
Physical Therapy Session Note  Patient Details  Name: Jorge Burns MRN: [id number] Date of Birth: 09/09/1941  Today's Date: 03/01/2018 PT Individual Time: 1000-1058 PT Individual Time Calculation (min): 58 min   Short Term Goals: Week 2:  PT Short Term Goal 1 (Week 2): Pt will maintain dynamic sitting balance w/ supervision PT Short Term Goal 2 (Week 2): Pt will attend to R environment during functional mobility w/ min cues 50% of the time PT Short Term Goal 3 (Week 2): Pt will ambulate 15' w/ mod assist w/ LRAD PT Short Term Goal 4 (Week 2): Pt will follow verbal commands during functional mobility 50% of the time PT Short Term Goal 5 (Week 2): Pt will self-propel w/c 150' w/ supervision in controlled environment  Skilled Therapeutic Interventions/Progress Updates:    Pt seated in w/c upon PT arrival, agreeable to therapy tx and denies pain. Pt transported to the gym. Interpreter not present for this session, therapist providing visual/tactile cues throughout tx session. Pt performed squat pivot transfer from w/c>mat with mod assist. Pt worked on dynamic seated balance while reaching outside BoS for bean bags, CGA, x 2 trials. Pt worked on dynamic standing balance while reaching outside BoS for bean bags, x 2 trials with min-mod assist. Pt worked on dynamic standing balance to perform toe taps on 2 inch step with L UE support on w/c, x 2 trials with total assist to block R knee during stance. Attempted to perform sit<>stands without UE support, increased pushing noted secondary to difficulty of the task. Pt performed standing balance task to reach overhead for clothespins, min-mod assist for balance. Pt transported back to room and left seated with needs in reach, QRB in place and chair alarm set.   Therapy Documentation Precautions:  Precautions Precautions: Fall Precaution Comments: non-English speaking Restrictions Weight Bearing Restrictions: No  See Function Navigator for Current  Functional Status.   Therapy/Group: Individual Therapy  Cresenciano GenreEmily van Schagen, PT, DPT 03/01/2018, 7:57 AM

## 2018-03-02 ENCOUNTER — Inpatient Hospital Stay (HOSPITAL_COMMUNITY): Payer: Medicare Other | Admitting: Occupational Therapy

## 2018-03-02 ENCOUNTER — Inpatient Hospital Stay (HOSPITAL_COMMUNITY): Payer: Medicare Other | Admitting: Speech Pathology

## 2018-03-02 ENCOUNTER — Inpatient Hospital Stay (HOSPITAL_COMMUNITY): Payer: Medicare Other

## 2018-03-02 NOTE — Progress Notes (Signed)
Occupational Therapy Session Note  Patient Details  Name: Jorge Burns MRN: [id number] Date of Birth: 11/17/1940  Today's Date: 03/02/2018 OT Individual Time: 1345-1415 OT Individual Time Calculation (min): 30 min    Short Term Goals: Week 1:  OT Short Term Goal 1 (Week 1): Pt will sit EOM for 5 min with supervision during functional task OT Short Term Goal 1 - Progress (Week 1): Progressing toward goal OT Short Term Goal 2 (Week 1): [facility] 1 and LRAD OT Short Term Goal 2 - Progress (Week 1): Progressing toward goal OT Short Term Goal 3 (Week 1): Pt will sit to stand with MIN A for balance in prep for clothing managmnet OT Short Term Goal 3 - Progress (Week 1): Progressing toward goal OT Short Term Goal 4 (Week 1): Pt will locate 2 items on R of sink wiht min VC OT Short Term Goal 4 - Progress (Week 1): Progressing toward goal Week 2:  OT Short Term Goal 1 (Week 2): Pt will sit EOM for 5 min with supervision during functional task OT Short Term Goal 2 (Week 2): Pt [facility] with MOD A of 1 and LRAD OT Short Term Goal 3 (Week 2): Pt will sit to stand with Mod A for balance in prep for clothing managmnet OT Short Term Goal 4 (Week 2): Pt will locate 2 items on R of sink wiht min VC  Skilled Therapeutic Interventions/Progress Updates:    1:1 NMR with focus on initiation of movement in right UE in sitting, supine and in sidelying. Pt required many repetitions of PROM before was able to initiation movement. Pt able to initiate flexion patterns of movement and relax flexor mm to go into extension (ie relax tone) throughout UE in all positions. Pt required max A to squat pivot from w/c to bed towards his right/ Transitional movements sit <supine<sidelying required mod A. Pt able to initiate retraction but difficulty with initiation of protraction in sidelying.  o movement detected distally (wrist or hand).  PT left in bed resting with bed alarm.   Therapy  Documentation Precautions:  Precautions Precautions: Fall Precaution Comments: non-English speaking Restrictions Weight Bearing Restrictions: No Pain: No c/o pain  See Function Navigator for Current Functional Status.   Therapy/Group: Individual Therapy  Roney MansSmith, Zailah Zagami [facility] 03/02/2018, 2:24 PM

## 2018-03-02 NOTE — Progress Notes (Signed)
Physical Therapy Session Note  Patient Details  Name: Jorge Burns MRN: [id number] Date of Birth: 10/16/1940  Today's Date: 03/02/2018 PT Individual Time: 1000-1058 PT Individual Time Calculation (min): 58 min   Short Term Goals: Week 2:  PT Short Term Goal 1 (Week 2): Pt will maintain dynamic sitting balance w/ supervision PT Short Term Goal 2 (Week 2): Pt will attend to R environment during functional mobility w/ min cues 50% of the time PT Short Term Goal 3 (Week 2): Pt will ambulate 15' w/ mod assist w/ LRAD PT Short Term Goal 4 (Week 2): Pt will follow verbal commands during functional mobility 50% of the time PT Short Term Goal 5 (Week 2): Pt will self-propel w/c 150' w/ supervision in controlled environment  Skilled Therapeutic Interventions/Progress Updates:    Pt seated in w/c upon PT arrival, agreeable to therapy tx and denies pain. Pt transported to the gym in w/c. Pt ambulated 2 x 30 ft this session with left handrail and mod assist, pt activates R hip flexors however total assist to advance R LE, total assist to block R knee during stance. Pt transferred from w/c>mat with mod assist to the L, verbal cues for techniques. Pt worked on sit<>stands from mat without UE support for LE NMR, while reaching for horseshoes to encourage anterior weightshift to prevent posteior/R lateral pushing. When standing without UE support pt with increased R lateral pushing requiring max assist for standing balance. Pt performed squat pivot transfer to the R with min assist from w/c>mat. Pt performed sit<>stands from w/c without use of UEs, reaching forward with L UE to grab bean bags facilitating anterior lean/trunk flexion with sit<>stands x8, max cues for sequencing, use of wall on L side for tactile cue to prevent pushing R. Pt transported back to room and left seated in w/c with needs in reach, QRB in place and chair alarm set.   Therapy Documentation Precautions:  Precautions Precautions:  Fall Precaution Comments: non-English speaking Restrictions Weight Bearing Restrictions: No   See Function Navigator for Current Functional Status.   Therapy/Group: Individual Therapy  Cresenciano GenreEmily van Schagen, PT, DPT 03/02/2018, 7:52 AM

## 2018-03-02 NOTE — Progress Notes (Signed)
Speech Language Pathology Daily Session Note  Patient Details  Name: Jorge Burns MRN: [id number] Date of Birth: 01/10/1941  Today's Date: 03/02/2018 SLP Individual Time: 1300-1340 SLP Individual Time Calculation (min): 40 min  Short Term Goals: Week 2: SLP Short Term Goal 1 (Week 2): Pt will convey wants/needs with use of multimodal communication ( gestures, facial expressions) with Max A multimodal cues.  SLP Short Term Goal 2 (Week 2): Patient will vocalize at the CV level in 50% of opportunities with Max A multimodal cues.  SLP Short Term Goal 3 (Week 2): Pt will follow 1 step functional directions related to self with 75% accuracy and Mod A multimodal cues SLP Short Term Goal 4 (Week 2): Patient will identify functional items from a field of 2 with 50% accuracy and Max A multimodal cues.  SLP Short Term Goal 5 (Week 2): Pt will consume trials of ice chips/thin liquids via TSP  with minimal overt s/s aspiration and Mod A verbal cues over 2 sessions to demonstrate readiness for repeat MBS.  SLP Short Term Goal 6 (Week 2): Pt will demonstrate efficient mastication and complete oral clearance of dys 2 texture trials without overt s/s aspiration and Min A verbal cues over 2 sessions prior to upgrade.   Skilled Therapeutic Interventions: Skilled treatment session focused on dysphagia and cognitive goals. SLP facilitated session by providing Min A verbal cues for thoroughness with oral care via the suction toothbrush. Patient consumed trials of ice chips with overt coughing and right anterior spillage. Suspect coughing due to what appeared to be a delayed swallow initiation that required Max A multimodal cues for patient to initiate. Patient also consumed trials of Dys. 2 textures with mildly prolonged mastication and right anterior spillage of bolus. Therefore, recommend patient continue current diet. SLP also facilitated session by providing Max A verbal and visual cues for problem solving in regards to  sorting from a field of 2 and completing a basic pattern. Patient left upright in wheelchair with all needs within reach. Continue with current plan of care.      Function:  Eating Eating   Modified Consistency Diet: Yes Eating Assist Level: Supervision or verbal cues;Set up assist for   Eating Set Up Assist For: Opening containers       Cognition Comprehension Comprehension assist level: Understands basic less than 25% of the time/ requires cueing >75% of the time;Other (comment)  Expression   Expression assist level: Expresses basis less than 25% of the time/requires cueing >75% of the time.  Social Interaction Social Interaction assist level: Interacts appropriately less than 25% of the time. May be withdrawn or combative.  Problem Solving Problem solving assist level: Solves basic less than 25% of the time - needs direction nearly all the time or does not effectively solve problems and may need a restraint for safety  Memory Memory assist level: Recognizes or recalls less than 25% of the time/requires cueing greater than 75% of the time    Pain No indications of pain   Therapy/Group: Individual Therapy  [name] 03/02/2018, 2:42 PM

## 2018-03-02 NOTE — Plan of Care (Signed)
  Problem: RH COGNITION-NURSING Goal: RH STG USES MEMORY AIDS/STRATEGIES W/ASSIST TO PROBLEM SOLVE Description STG Uses Memory Aids/Strategies With  Mod Assistance to Problem Solve.  Outcome: Not Progressing   Problem: RH COGNITION-NURSING Goal: RH STG ANTICIPATES NEEDS/CALLS FOR ASSIST W/ASSIST/CUES Description STG Anticipates Needs/Calls for Assist With Assistance/Cues. Outcome: Not Progressing   Problem: RH COGNITION-NURSING Goal: RH STG ANTICIPATES NEEDS/CALLS FOR ASSIST W/ASSIST/CUES Description STG Anticipates Needs/Calls for Assist With Assistance/Cues. Outcome: Not Progressing   Problem: RH KNOWLEDGE DEFICIT Goal: RH STG INCREASE KNOWLEDGE OF DYSPHAGIA/FLUID INTAKE Description Patient Jorge Burns/family will be able understand and increase knowledge of dysphagia/fluid intake using cues/resources independently   Outcome: Not Progressing  Language barrier and cognitive deficit staff will continue to provide interpreter and continuous education

## 2018-03-02 NOTE — Progress Notes (Signed)
Occupational Therapy Session Note  Patient Details  Name: Jorge Burns Heart MRN: [id number] Date of Birth: 03/07/1941  Today's Date: 03/02/2018 OT Individual Time: 0900-1000 OT Individual Time Calculation (min): 60 min    Short Term Goals: Week 2:  OT Short Term Goal 1 (Week 2): Pt will sit EOM for 5 min with supervision during functional task OT Short Term Goal 2 (Week 2): [facility] A [facility] 1 and LRAD OT Short Term Goal 3 (Week 2): Pt will sit to stand with Mod A for balance in prep for clothing managmnet OT Short Term Goal 4 (Week 2): Pt will locate 2 items on R of sink wiht min VC  Skilled Therapeutic Interventions/Progress Updates:    Pt greeted semi-reclined in bed with interpreter and family member present. Asked pt if he needed to go to the bathroom, but he did not respond. OT took pt to the bathroom using Stedy with min A to stand, and min A to maintain balance when transitioning over toilet. Pt with continent bowel and bladder on commode. Sit<>stand in stedy with mod A from lower toilet surface with total A for peri-care. Pt then transitioned into shower in similar fashion. Bathing completed with OT providing hand over hand A to integrate R UE for neuro re-ed. Pt less perseverative on washing face today, and advanced to other body parts without cues. Worked on sitting balance within figure 4 position to wash feet with pt needing min A for dynamic balance. Stedy used to transfer back out of shower with min A to stand. Dressing completed with sit<>stands at overall mod A. Worked on standing balance at the sink with focus on anterior weight shift in standing. Had pt try to brush his hair in standing requiring demonstrational cues for how to use brush 2/2 apraxia and max A to maintain balance 2/2 posterior and lateral LOB with increased task demand. Pt left seated in wc at end of session with chair alarm, safety belt, and interpreter present.    Therapy Documentation Precautions:   Precautions Precautions: Fall Precaution Comments: non-English speaking Restrictions Weight Bearing Restrictions: No Pain: Pain Assessment Pain Score: Asleep  See Function Navigator for Current Functional Status.   Therapy/Group: Individual Therapy  Mal Amabilelisabeth S Fontaine Hehl 03/02/2018, 9:20 AM

## 2018-03-02 NOTE — Progress Notes (Addendum)
[place] PHYSICAL MEDICINE & REHABILITATION     PROGRESS NOTE  Subjective/Complaints:   Remains aphasic, no attempted verbalization, responsive smiling Eats well with full supervision , tried to drink yogurt  ROS: Limited due to aphasia   Objective: Vital Signs: Blood pressure 130/83, pulse 72, temperature 98 F (36.7 C), temperature source Axillary, resp. rate 15, weight 54.1 kg (119 lb 4.3 oz), SpO2 98 %. No results found. No results for input(s): WBC, HGB, HCT, PLT in the last 72 hours. No results for input(s): NA, K, CL, GLUCOSE, BUN, CREATININE, CALCIUM in the last 72 hours.  Invalid input(s): CO CBG (last 3)  No results for input(s): GLUCAP in the last 72 hours.  Wt Readings from Last 3 Encounters:  03/02/18 54.1 kg (119 lb 4.3 oz)  02/13/18 57.2 kg (126 lb 1.7 oz)  02/11/18 63.5 kg (140 lb)    Physical Exam:  BP 130/83 (BP Location: Left Arm)   Pulse 72   Temp 98 F (36.7 C) (Axillary)   Resp 15   Wt 54.1 kg (119 lb 4.3 oz)   SpO2 98%   BMI 19.25 kg/m  Constitutional: No distress . Vital signs reviewed. HEENT: EOMI, oral membranes moist Neck: supple Cardiovascular: RRR without murmur. No JVD    Respiratory: CTA Bilaterally without wheezes or rales. Normal effort    GI: BS +, non-tender, non-distended  Musculoskeletal:No edema.  No tenderness. Neurological: Non-English-speaking [place] male.  ?aphasia + language.  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  Spontaneously moving LUE/LLE  Skin: warm and dry. Intact. Psychiatric:Flat  Assessment/Plan: 1. Functional deficits secondary to multifocal bilateral infarcts which require 3+ hours per day of interdisciplinary therapy in a comprehensive inpatient rehab setting. Physiatrist is providing close team supervision and 24 hour management of active medical problems listed below. Physiatrist and rehab team continue to assess barriers to discharge/monitor patient progress toward  functional and medical goals.  Function:  Bathing Bathing position   Position: Shower  Bathing parts Body parts bathed by patient: Right arm, Chest, Abdomen, Front perineal area, Right upper leg, Left upper leg, Left lower leg Body parts bathed by helper: Left arm, Buttocks, Left lower leg  Bathing assist Assist Level: Touching or steadying assistance(Pt > 75%)      Upper Body Dressing/Undressing Upper body dressing   What is the patient wearing?: Pull over shirt/dress     Pull over shirt/dress - Perfomed by patient: Thread/unthread left sleeve, Put head through opening Pull over shirt/dress - Perfomed by helper: Thread/unthread right sleeve, Pull shirt over trunk        Upper body assist Assist Level: Touching or steadying assistance(Pt > 75%)      Lower Body Dressing/Undressing Lower body dressing   What is the patient wearing?: Pants   Underwear - Performed by helper: Thread/unthread right underwear leg, Thread/unthread left underwear leg, Pull underwear up/down Pants- Performed by patient: Thread/unthread left pants leg Pants- Performed by helper: Thread/unthread right pants leg, Pull pants up/down Non-skid slipper socks- Performed by patient: Don/doff left sock Non-skid slipper socks- Performed by helper: Don/doff right sock                  Lower body assist Assist for lower body dressing: Touching or steadying assistance (Pt > 75%)      Toileting Toileting Toileting activity did not occur: No continent bowel/bladder event Toileting steps completed by patient: Adjust clothing after toileting Toileting steps completed by helper: Adjust clothing prior to toileting, Performs perineal  hygiene, Adjust clothing after toileting    Toileting assist Assist level: Touching or steadying assistance (Pt.75%)   Transfers Chair/bed transfer   Chair/bed transfer method: Squat pivot Chair/bed transfer assist level: Moderate assist (Pt 50 - 74%/lift or lower) Chair/bed  transfer assistive device: Armrests, Bedrails Mechanical lift: Stedy   Locomotion Ambulation Ambulation activity did not occur: Safety/medical concerns   Max distance: 25 Assist level: 2 helpers(maxA, w/c follow)   Wheelchair   Type: Manual Max wheelchair distance: 150 Assist Level: Touching or steadying assistance (Pt > 75%)  Cognition Comprehension Comprehension assist level: Understands basic less than 25% of the time/ requires cueing >75% of the time, Other (comment)  Expression Expression assist level: Expresses basis less than 25% of the time/requires cueing >75% of the time.  Social Interaction Social Interaction assist level: Interacts appropriately less than 25% of the time. May be withdrawn or combative.  Problem Solving Problem solving assist level: Solves basic less than 25% of the time - needs direction nearly all the time or does not effectively solve problems and may need a restraint for safety  Memory Memory assist level: Recognizes or recalls less than 25% of the time/requires cueing greater than 75% of the time    Medical Problem List and Plan: 1.Right-sided weakness and slurred speechsecondary to multifocal acute ischemia within the left hemisphere, predominantly within the deep watershed zone and left ACA/MCA cortical watershed zone. Small subacute infarct within the right side white matter adjacent to the corpus callosum splenium on 5/26 On ASA/Plavix  Continue CIR PT, OT, SLP- Team conference today please see physician documentation under team conference tab, met with team face-to-face to discuss problems,progress, and goals. Formulized individual treatment plan based on medical history, underlying problem and comorbidities. 2. DVT Prophylaxis/Anticoagulation: Subcutaneous heparin 3. Pain Management:Tylenol as needed 4. Mood:Provide emotional support 5. Neuropsych: This patientis? capable of making decisions on hisown behalf. 6. Skin/Wound Care:Routine  skin checks 7. Fluids/Electrolytes/Nutrition:Routine in and outs  BMP within acceptable range on 6/3 Continue to encourage PO, I 360 ml recorded with meals yesterday 8.ICA stenosis.Follow up IR for possibleICA revascularization. Procedure based on timeframe of rehab services 9.Hyperlipidemia. Lipitor 10.  Hypertension  Lisinopril 2.5 mg daily Vitals:   03/01/18 2012 03/02/18 0516  BP: 123/66 130/83  Pulse: 80 72  Resp: 16 15  Temp: 98 F (36.7 C) 98 F (36.7 C)  SpO2: 94% 98%  good control 6/12  11. anemia-resolved  Hb 12.6 on 5/31  Recheck 6/3 13.5 12. Postoperative dysphagia  D1 nectar liquids-continue to push p.o.  Advance diet as tolerated 13.  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

## 2018-03-03 ENCOUNTER — Inpatient Hospital Stay (HOSPITAL_COMMUNITY): Payer: Medicare Other | Admitting: Physical Therapy

## 2018-03-03 ENCOUNTER — Inpatient Hospital Stay (HOSPITAL_COMMUNITY): Payer: Medicare Other | Admitting: Speech Pathology

## 2018-03-03 ENCOUNTER — Inpatient Hospital Stay (HOSPITAL_COMMUNITY): Payer: Medicare Other

## 2018-03-03 ENCOUNTER — Inpatient Hospital Stay (HOSPITAL_COMMUNITY): Payer: Medicare Other | Admitting: Occupational Therapy

## 2018-03-03 NOTE — Plan of Care (Signed)
  Problem: RH SAFETY Goal: RH STG ADHERE TO SAFETY PRECAUTIONS W/ASSISTANCE/DEVICE Description STG Adhere to Safety Precautions With mod. Assistance/Device.  Outcome: Progressing  Call light at hand, bed/chair alarm, proper footwear  

## 2018-03-03 NOTE — Progress Notes (Signed)
Physical Therapy Session Note  Patient Details  Name: Jorge Burns MRN: [id number] Date of Birth: 02/19/1941  Today's Date: 03/03/2018 PT Individual Time: 1002-1100 PT Individual Time Calculation (min): 58 min   Short Term Goals: Week 2:  PT Short Term Goal 1 (Week 2): Pt will maintain dynamic sitting balance w/ supervision PT Short Term Goal 2 (Week 2): Pt will attend to R environment during functional mobility w/ min cues 50% of the time PT Short Term Goal 3 (Week 2): Pt will ambulate 15' w/ mod assist w/ LRAD PT Short Term Goal 4 (Week 2): Pt will follow verbal commands during functional mobility 50% of the time PT Short Term Goal 5 (Week 2): Pt will self-propel w/c 150' w/ supervision in controlled environment  Skilled Therapeutic Interventions/Progress Updates:    w/c mobility training using hemi-technique with overall min assist and max verbal cues for attention to obstacles on R. Squat and stand pivot transfer training to R and L with focus on technique, weightshfting, and hand placement with mod assist overall and tactile cues for correct hand placement. NMR for postural control re-training in standing position with focus on reorientation to midline with pt pushing to the R and demonstrates no ability to correct for position even with multimodal cues including mirror for visual feedback- progressed to dynamic reaching activity to promote weightshift to the L in standing with max assist and attempted with 2" step under LLE. NMR during gait training at rail with overall mod assist for facilitation of R hip flexion, demonstrating fair stance control x 10', x 30', and x 30'. Demonstrated decreased pushing tendencies with LUE support on rail during gait.  Medical interpreter present.  Therapy Documentation Precautions:  Precautions Precautions: Fall Precaution Comments: non-English speaking Restrictions Weight Bearing Restrictions: No   Pain: Pain Assessment Pain Score: 0-No pain Faces  Pain Scale: No hurt   See Function Navigator for Current Functional Status.   Therapy/Group: Individual Therapy  Karolee StampsGray, Johnson Arizola Darrol PokeBrescia  Zala Degrasse B. Fujiko Picazo, PT, DPT  03/03/2018, 12:00 PM

## 2018-03-03 NOTE — Progress Notes (Signed)
[place] PHYSICAL MEDICINE & REHABILITATION     PROGRESS NOTE  Subjective/Complaints:   Eating Breakfast , laughs but no other verbalization ROS: Limited due to aphasia   Objective: Vital Signs: Blood pressure 128/85, pulse (!) 102, temperature 98 F (36.7 C), temperature source Axillary, resp. rate 16, weight 54.1 kg (119 lb 4.3 oz), SpO2 96 %. No results found. No results for input(s): WBC, HGB, HCT, PLT in the last 72 hours. No results for input(s): NA, K, CL, GLUCOSE, BUN, CREATININE, CALCIUM in the last 72 hours.  Invalid input(s): CO CBG (last 3)  No results for input(s): GLUCAP in the last 72 hours.  Wt Readings from Last 3 Encounters:  03/02/18 54.1 kg (119 lb 4.3 oz)  02/13/18 57.2 kg (126 lb 1.7 oz)  02/11/18 63.5 kg (140 lb)    Physical Exam:  BP 128/85 (BP Location: Left Arm)   Pulse (!) 102   Temp 98 F (36.7 C) (Axillary)   Resp 16   Wt 54.1 kg (119 lb 4.3 oz)   SpO2 96%   BMI 19.25 kg/m  Constitutional: No distress . Vital signs reviewed. HEENT: EOMI, oral membranes moist Neck: supple Cardiovascular: RRR without murmur. No JVD    Respiratory: CTA Bilaterally without wheezes or rales. Normal effort    GI: BS +, non-tender, non-distended  Musculoskeletal:No edema.  No tenderness. Neurological: Non-English-speaking [place] male.  ?aphasia + language.  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  Spontaneously moving LUE/LLE  Skin: warm and dry. Intact. Psychiatric:Flat  Assessment/Plan: 1. Functional deficits secondary to multifocal bilateral infarcts which require 3+ hours per day of interdisciplinary therapy in a comprehensive inpatient rehab setting. Physiatrist is providing close team supervision and 24 hour management of active medical problems listed below. Physiatrist and rehab team continue to assess barriers to discharge/monitor patient progress toward functional and medical  goals.  Function:  Bathing Bathing position   Position: Shower  Bathing parts Body parts bathed by patient: Right arm, Chest, Abdomen, Front perineal area, Right upper leg, Left upper leg, Left lower leg Body parts bathed by helper: Left arm, Buttocks, Left lower leg  Bathing assist Assist Level: Touching or steadying assistance(Pt > 75%)      Upper Body Dressing/Undressing Upper body dressing   What is the patient wearing?: Pull over shirt/dress     Pull over shirt/dress - Perfomed by patient: Thread/unthread left sleeve, Put head through opening, Pull shirt over trunk Pull over shirt/dress - Perfomed by helper: Thread/unthread right sleeve        Upper body assist Assist Level: Touching or steadying assistance(Pt > 75%)      Lower Body Dressing/Undressing Lower body dressing   What is the patient wearing?: Pants, Non-skid slipper socks   Underwear - Performed by helper: Thread/unthread right underwear leg, Thread/unthread left underwear leg, Pull underwear up/down Pants- Performed by patient: Thread/unthread left pants leg, Pull pants up/down Pants- Performed by helper: Thread/unthread right pants leg Non-skid slipper socks- Performed by patient: Don/doff left sock Non-skid slipper socks- Performed by helper: Don/doff right sock                  Lower body assist Assist for lower body dressing: Touching or steadying assistance (Pt > 75%)      Toileting Toileting Toileting activity did not occur: No continent bowel/bladder event Toileting steps completed by patient: Adjust clothing after toileting Toileting steps completed by helper: Adjust clothing prior to toileting, Performs perineal hygiene, Adjust clothing after toileting  Toileting assist Assist level: Touching or steadying assistance (Pt.75%)   Transfers Chair/bed transfer   Chair/bed transfer method: Squat pivot Chair/bed transfer assist level: Moderate assist (Pt 50 - 74%/lift or lower) Chair/bed  transfer assistive device: Armrests Mechanical lift: Stedy   Locomotion Ambulation Ambulation activity did not occur: Safety/medical concerns   Max distance: 30 ft Assist level: Moderate assist (Pt 50 - 74%)   Wheelchair   Type: Manual Max wheelchair distance: 150 Assist Level: Touching or steadying assistance (Pt > 75%)  Cognition Comprehension Comprehension assist level: Understands basic less than 25% of the time/ requires cueing >75% of the time, Other (comment)  Expression Expression assist level: Expresses basis less than 25% of the time/requires cueing >75% of the time.  Social Interaction Social Interaction assist level: Interacts appropriately less than 25% of the time. May be withdrawn or combative.  Problem Solving Problem solving assist level: Solves basic less than 25% of the time - needs direction nearly all the time or does not effectively solve problems and may need a restraint for safety  Memory Memory assist level: Recognizes or recalls less than 25% of the time/requires cueing greater than 75% of the time    Medical Problem List and Plan: 1.Right-sided weakness and slurred speechsecondary to multifocal acute ischemia within the left hemisphere, predominantly within the deep watershed zone and left ACA/MCA cortical watershed zone. Small subacute infarct within the right side white matter adjacent to the corpus callosum splenium on 5/26 On ASA/Plavix  Continue CIR PT, OT, SLP-  2. DVT Prophylaxis/Anticoagulation: Subcutaneous heparin 3. Pain Management:Tylenol as needed 4. Mood:Provide emotional support 5. Neuropsych: This patientis? capable of making decisions on hisown behalf. 6. Skin/Wound Care:Routine skin checks 7. Fluids/Electrolytes/Nutrition:Routine in and outs  BMP within acceptable range on 6/3 Continue to encourage PO, I 360 ml recorded with meals yesterday 8.ICA stenosis.Follow up IR for possibleICA revascularization. Procedure  based on timeframe of rehab services 9.Hyperlipidemia. Lipitor 10.  Hypertension  Lisinopril 2.5 mg daily Vitals:   03/03/18 0425 03/03/18 0800  BP: 137/80 128/85  Pulse: 95 (!) 102  Resp: 16   Temp: 98 F (36.7 C)   SpO2: 99% 96%  good control 6/12  11. anemia-resolved  Hb 12.6 on 5/31  Recheck 6/3 13.5 12. Postoperative dysphagia  D1 nectar liquids-continue to push p.o.  Advance diet as tolerated 13.  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

## 2018-03-03 NOTE — Patient Care Conference (Signed)
Inpatient RehabilitationTeam Conference and Plan of Care Update Date: 03/02/2018   Time: 11:15 AM    Patient Name: Jorge Burns      Medical Record Number: [id number]  Date of Birth: 1941/07/13 Sex: Male         Room/Bed: 4W19C/4W19C-01 Payor Info: Payor: MEDICARE / Plan: MEDICARE PART A AND B / Product Type: *No Product type* /    Admitting Diagnosis: L CVA  Admit Date/Time:  02/18/2018  5:55 PM Admission Comments: No comment available   Primary Diagnosis:  <principal problem not specified> Principal Problem: <principal problem not specified>  Patient Active Problem List   Diagnosis Date Noted  . Dysphagia, post-stroke   . Acute blood loss anemia   . Essential hypertension   . Acute bilat watershed infarction [facility]) 02/18/2018  . Aphasic disturbance   . Right hemiparesis (HCC)   . Pulmonary fungal infection   . Diastolic dysfunction   . Prediabetes   . CVA (cerebral vascular accident) (HCC) 02/13/2018  . Influenza B 11/29/2015  . History of CVA (cerebrovascular accident)   . Language barrier   . Benign essential HTN   . Syncope 11/28/2015    Expected Discharge Date: Expected Discharge Date: 03/11/18  Team Members Present: Physician leading conference: Dr. Claudette Laws Social Worker Present: Amada Jupiter, LCSW Nurse Present: Aubery Lapping, RN) PT Present: Woodfin Ganja, PT OT Present: Kearney Hard, OT SLP Present: Feliberto Gottron, SLP PPS Coordinator present : Tora Duck, RN, CRRN     Current Status/Progress Goal Weekly Team Focus  Medical   Aphasia, unable to communicate even with interpreter, affect is brighter smiling responsively.  Improve medication  Increase activity tolerance   Bowel/Bladder   incontinenet of bowel & bladder, LBM 6/10  less episode of incontinence  continue to monitor & timed toileting   Swallow/Nutrition/ Hydration   Dys. 1 textures with nectar-thick liquids   Min A  trials of upgraded textures/liquids    ADL's   Mod A for most BADL  tasks, still needs max A for toilet transfers and can be max A for standing balance for clothing management  Min A overall  Sit<>stands, R attention, apraxia, R NMR, modified bathing/dressing   Mobility   min-mod assist bed mobility, mod assist transfers, max assist for gait, min assist w/c propulsion  min assist  R attention, R NMR, transfers, gait   Communication   Total A  Min A for multimodal communication  following commands, use of gestures, vocalization of command    Safety/Cognition/ Behavioral Observations  Max A  Min A  attention, problem solving    Pain   no signs of pain or discomfort  no signs of pain  assess for non verbal signs of pain   Skin   ecchymosis to bil arms & abdomen, no skin breakdown  no new areas of skin breakdown  assess q shift      *See Care Plan and progress notes for long and short-term goals.     Barriers to Discharge  Current Status/Progress Possible Resolutions Date Resolved   Physician    Medical stability;Medication compliance     Slow progress, communication limiting interaction with therapist  Continue rehab, each therapist to address nonverbal communication      Nursing  Incontinence;Medication compliance;Lack of/limited family support;Decreased caregiver support               PT  OT                  SLP                SW                Discharge Planning/Teaching Needs:  Plan for pt to d/c home with grandaughter providing care (?additional assist from other family) vs SNF  Teaching still to be planned   Team Discussion:  ?some improved affect in engagement.  Timed toileting needed for better continence.  Mod assist tfs at times but mostly max assist and pushing hard to the right.  Very apraxic but some improvement.  Mod/ mod-max for standing balance.  Limited gains with ST.  Still showing s/s of aspiration.  Goals set for min assist overall/ wheelchair level.  SW notes may change to SNF.  Revisions to Treatment  Plan:  none    Continued Need for Acute Rehabilitation Level of Care: The patient requires daily medical management by a physician with specialized training in physical medicine and rehabilitation for the following conditions: Daily direction of a multidisciplinary physical rehabilitation program to ensure safe treatment while eliciting the highest outcome that is of practical value to the patient.: Yes Daily medical management of patient stability for increased activity during participation in an intensive rehabilitation regime.: Yes Daily analysis of laboratory values and/or radiology reports with any subsequent need for medication adjustment of medical intervention for : Neurological problems;Other  Jorge Burns 03/03/2018, 4:06 PM

## 2018-03-03 NOTE — Progress Notes (Addendum)
  Social Work Patient ID: Engineer, manufacturing systemsHuoi Burns, male   DOB: 10/15/1940, 77 y.o.   MRN: [id number]  Have left a VM for pts grandaughter about team conf/ discharge date and need to begin family ed and confirm d/c plans.    Jorge Ibach, LCSW       Received call back from grandaughter who understands that we need to confirm care plans for d/c.  She states that she cannot provide the needed level of care, however, she will meet with other family members and friends over the next few days to see if anyone else could provide the care. She will follow up with Jorge DerBecky Dupree, LCSW on Monday to determine if need to pursue SNF.  Jorge Scheiber, LCSW

## 2018-03-03 NOTE — Progress Notes (Signed)
Speech Language Pathology Daily Session Note  Patient Details  Name: Jorge Burns MRN: [id number] Date of Birth: 10/21/1940  Today's Date: 03/03/2018 SLP Individual Time: 1300-1330 SLP Individual Time Calculation (min): 30 min  Short Term Goals: Week 2: SLP Short Term Goal 1 (Week 2): Pt will convey wants/needs with use of multimodal communication ( gestures, facial expressions) with Max A multimodal cues.  SLP Short Term Goal 2 (Week 2): Patient will vocalize at the CV level in 50% of opportunities with Max A multimodal cues.  SLP Short Term Goal 3 (Week 2): Pt will follow 1 step functional directions related to self with 75% accuracy and Mod A multimodal cues SLP Short Term Goal 4 (Week 2): Patient will identify functional items from a field of 2 with 50% accuracy and Max A multimodal cues.  SLP Short Term Goal 5 (Week 2): Pt will consume trials of ice chips/thin liquids via TSP  with minimal overt s/s aspiration and Mod A verbal cues over 2 sessions to demonstrate readiness for repeat MBS.  SLP Short Term Goal 6 (Week 2): Pt will demonstrate efficient mastication and complete oral clearance of dys 2 texture trials without overt s/s aspiration and Min A verbal cues over 2 sessions prior to upgrade.   Skilled Therapeutic Interventions: Skilled treatment session focused on dysphagia and speech goals. SLP facilitated session by providing Min A verbal cues for problem solving during oral care via the suction toothbrush. Patient consumed trials of ice chips with overt s/s of aspiration and right anterior spillage, suspect due to poor oral control and suspected delayed swallow initiation. Recommend continued trials with SLP. SLP also facilitated session by attempting to utilize a communication board with pictures and written words at the word level to maximize multimodal communication. However, patient unable to utilize functionally, suspect due to combination of visual impairments, language impairments and  possible baseline level of education. SLP will attempt a more simple communication with larger pictures at next session. Patient left upright in wheelchair with all needs within reach. Continue with current plan of care.      Function:   Cognition Comprehension Comprehension assist level: Understands basic less than 25% of the time/ requires cueing >75% of the time  Expression   Expression assist level: Expresses basis less than 25% of the time/requires cueing >75% of the time.  Social Interaction Social Interaction assist level: Interacts appropriately 25 - 49% of time - Needs frequent redirection.  Problem Solving Problem solving assist level: Solves basic less than 25% of the time - needs direction nearly all the time or does not effectively solve problems and may need a restraint for safety  Memory Memory assist level: Recognizes or recalls less than 25% of the time/requires cueing greater than 75% of the time    Pain No indications of pain   Therapy/Group: Individual Therapy  Ammy Lienhard 03/03/2018, 3:12 PM

## 2018-03-03 NOTE — Progress Notes (Signed)
Physical Therapy Session Note  Patient Details  Name: Jorge Burns MRN: [id number] Date of Birth: 05/17/1941  Today's Date: 03/03/2018 PT Individual Time: 1340-1420 PT Individual Time Calculation (min): 40 min   Short Term Goals: Week 2:  PT Short Term Goal 1 (Week 2): Pt will maintain dynamic sitting balance w/ supervision PT Short Term Goal 2 (Week 2): Pt will attend to R environment during functional mobility w/ min cues 50% of the time PT Short Term Goal 3 (Week 2): Pt will ambulate 15' w/ mod assist w/ LRAD PT Short Term Goal 4 (Week 2): Pt will follow verbal commands during functional mobility 50% of the time PT Short Term Goal 5 (Week 2): Pt will self-propel w/c 150' w/ supervision in controlled environment  Skilled Therapeutic Interventions/Progress Updates:   Pt in w/c and agreeable to therapy, no evidence or c/o pain during session. Interpreter present throughout session. Total assist w/c transport for time management to/from therapy gym. Worked on standing posture and balance w/ emphasis on anterior weight shifting and L lateral weight shifting. Static stance performed at high/low table to allow pt to WB w/ flat LUE in attempts to decrease R pushing. Able to achieve static standing balance w/ manual assist only at R quad ~75% time w/ max verbal and visual cues. Visual target to reach for w/ LUE when standing to promote anterior weight shift as pt has posterior bias. Performed pregait tasks including R and L forward and backward stepping w/ manual facilitation of R hip flexion and knee extension respectively. Pt w/ increased fatigue in standing, requiring more assist. Transitioned to w/c mobility for energy conservation and increased independence w/ locomotion. Pt self-propelled w/c around unit >150' and w/ tight turns. Supervision 90% of time w/ a few instances of manual assist for obstacle avoidance on R side. Mod verbal and visual cues for safe negotiation, much improved from previous  sessions with this therapist. Returned to room and transferred to EOB and to supine w/ mod assist. Ended session in supine, call bell within reach and all needs met.   Therapy Documentation Precautions:  Precautions Precautions: Fall Precaution Comments: non-English speaking Restrictions Weight Bearing Restrictions: No Vital Signs: Therapy Vitals Pulse Rate: 92 Resp: 18 BP: 108/62 Patient Position (if appropriate): Sitting Oxygen Therapy SpO2: 97 % O2 Device: Room Air  See Function Navigator for Current Functional Status.   Therapy/Group: Individual Therapy  [name] K Arnette 03/03/2018, 2:21 PM

## 2018-03-03 NOTE — Progress Notes (Signed)
Occupational Therapy Session Note  Patient Details  Name: Jorge Burns MRN: [id number] Date of Birth: Nov 27, 1940  Today's Date: 03/03/2018 OT Individual Time: 0905-1000 OT Individual Time Calculation (min): 55 min   Short Term Goals: Week 2:  OT Short Term Goal 1 (Week 2): Pt will sit EOM for 5 min with supervision during functional task OT Short Term Goal 2 (Week 2): Pt will transfer to [facility] with MOD A of 1 and LRAD OT Short Term Goal 3 (Week 2): Pt will sit to stand with Mod A for balance in prep for clothing managmnet OT Short Term Goal 4 (Week 2): Pt will locate 2 items on R of sink wiht min VC  Skilled Therapeutic Interventions/Progress Updates:    Pt greeted semi-reclined in bed with interpreter present. Pt came to sitting EOB with mod A and improved trunk control for min A sitting balance. Stedy used to transfer pt onto commode with pt able to stand with min A in [place]. When standing in [place] at commode to manage clothing, pt with more pushing to the R and required max A to maintain balance while OT assisted with clothing management. Pt voided bladder successfully, but was unable to move bowels. Total A for peri-care and to don new brief. Pt brought to the sink in wc for dressing tasks. Focus on sequencing, initiation, and R attention within dressing tasks. Max multimodal cues and hand over hand A to try to get pt to thread R LE into sleeve first, but pt unable to grasp concept. He was able to sequence 2/4 steps of UB dressing. Pt perseverative on shirt donning ask and demonstrated ideational apraxia when trying to don pants has a shirt. Pt needed max multimodal cues to stop and utilize pants in correct way. Sit<>stand at the sink with mod A and max A to maintain balance when pt trying to pull up pants 2/2 severe lean and push to the R. R UE NMR and standing balance with standing towel pushes. OT facilitated anterior weight shift with leaning onto table, then facilitated R UE shoulder flex/ext with  joint input provided to help bring pt through ROM. Pt returned to room and left seated in wc with safety blt on and needs met.   Therapy Documentation Precautions:  Precautions Precautions: Fall Precaution Comments: non-English speaking Restrictions Weight Bearing Restrictions: No Pain:   none/denies pain See Function Navigator for Current Functional Status.   Therapy/Group: Individual Therapy  Valma Cava 03/03/2018, 9:17 AM

## 2018-03-03 NOTE — Progress Notes (Signed)
Physical Therapy Weekly Progress Note  Patient Details  Name: Jorge Burns MRN: [id number] Date of Birth: Jan 24, 1941  Beginning of progress report period: February 25, 2018 End of progress report period: March 04, 2018  Today's Date: 03/03/2018 PT Individual Time: 1340-1420 PT Individual Time Calculation (min): 40 min   Patient has met 4 of 5 short term goals. Pt continues to make steady progress towards LTGs and is performing most functional mobility at the mod assist level. He is more consistently performing transfers w/ min-mod assist and ambulating up to 30' at rail w/ mod assist for RLE management and postural control. He continues to require frequent skilled cues for R attention, initiation, and attention to task. Additionally, he demonstrates decreased carryover of functional tasks and requires mod-max cues for postural control turning functional tasks 2/2 pushing to R and posterior lean bias.   Patient continues to demonstrate the following deficits muscle weakness, decreased cardiorespiratoy endurance, impaired timing and sequencing, unbalanced muscle activation, motor apraxia and decreased coordination, decreased visual perceptual skills, decreased attention to right and decreased motor planning, decreased initiation, decreased attention, decreased awareness, decreased problem solving, decreased safety awareness, decreased memory and delayed processing and decreased sitting balance, decreased standing balance, decreased postural control, hemiplegia and decreased balance strategies and therefore will continue to benefit from skilled PT intervention to increase functional independence with mobility.  Patient progressing toward long term goals..  Continue plan of care.  PT Short Term Goals Week 2:  PT Short Term Goal 1 (Week 2): Pt will maintain dynamic sitting balance w/ supervision PT Short Term Goal 1 - Progress (Week 2): Progressing toward goal PT Short Term Goal 2 (Week 2): Pt will attend to R  environment during functional mobility w/ min cues 50% of the time PT Short Term Goal 2 - Progress (Week 2): Met PT Short Term Goal 3 (Week 2): Pt will ambulate 15' w/ mod assist w/ LRAD PT Short Term Goal 3 - Progress (Week 2): Met PT Short Term Goal 4 (Week 2): Pt will follow verbal commands during functional mobility 50% of the time PT Short Term Goal 4 - Progress (Week 2): Met PT Short Term Goal 5 (Week 2): Pt will self-propel w/c 150' w/ supervision in controlled environment PT Short Term Goal 5 - Progress (Week 2): Met Week 3:  PT Short Term Goal 1 (Week 3): =LTGs due to ELOS  Skilled Therapeutic Interventions/Progress Updates:   Pt in w/c and agreeable to therapy, no c/o or evidence of pain. Pt self-propelled w/c around unit this session w/ supervision using L hemi technique. However, max verbal and visual cues for obstacle negotiation on R side and direction. Worked on gait at rail and w/ hemiwalker this session, focusing overall on RLE management. Ambulated at rail 30' x2 bouts w/ mod assist and min manual cues for R quad in single leg stance, total assist for R swing through and step placement. Ambulated 30' w/ hemiwalker w/ max assist for hemiwalker management and same RLE cues as when ambulating w/ rail. Hemiwalker management much improved than during previous attempt, pt able to overcome R pushing to utilize hemiwalker w/ max assist. Transitioned to supine and then L sidelying to work on RLE NMR in gravity eliminated position. Performed assisted hip flexion and knee extension movements w/ max tactile and manual cues to engage R hip musculature and R quad. Additionally performed kinetron 5 min @ 90 cm/min for reciprocal movement pattern and RLE muscle activation. Returned to room and ended session in  w/c, call bell within reach and all needs met.   Therapy Documentation Precautions:  Precautions Precautions: Fall Precaution Comments: non-English speaking Restrictions Weight Bearing  Restrictions: No Vital Signs: Therapy Vitals Pulse Rate: 88 Resp: 16 BP: 112/79 Patient Position (if appropriate): Lying Oxygen Therapy SpO2: 98 % O2 Device: Room Air  See Function Navigator for Current Functional Status.  Therapy/Group: Individual Therapy  Ayodeji Keimig K Arnette 03/03/2018, 5:19 PM

## 2018-03-04 ENCOUNTER — Inpatient Hospital Stay (HOSPITAL_COMMUNITY): Payer: Medicare Other | Admitting: Physical Therapy

## 2018-03-04 ENCOUNTER — Inpatient Hospital Stay (HOSPITAL_COMMUNITY): Payer: Medicare Other

## 2018-03-04 ENCOUNTER — Inpatient Hospital Stay (HOSPITAL_COMMUNITY): Payer: Medicare Other | Admitting: Occupational Therapy

## 2018-03-04 ENCOUNTER — Inpatient Hospital Stay (HOSPITAL_COMMUNITY): Payer: Medicare Other | Admitting: Speech Pathology

## 2018-03-04 NOTE — Plan of Care (Signed)
  Problem: RH SAFETY Goal: RH STG ADHERE TO SAFETY PRECAUTIONS W/ASSISTANCE/DEVICE Description STG Adhere to Safety Precautions With mod. Assistance/Device.  Outcome: Progressing  Call light at hand, bed/chair alarm, proper footwear  

## 2018-03-04 NOTE — Progress Notes (Signed)
Occupational Therapy Session Note  Patient Details  Name: Jorge Burns MRN: [id number] Date of Birth: 1941/04/26  Today's Date: 03/04/2018 OT Individual Time: 9056-4698 OT Individual Time Calculation (min): 42 min    Short Term Goals: Week 1:  OT Short Term Goal 1 (Week 1): Pt will sit EOM for 5 min with supervision during functional task OT Short Term Goal 1 - Progress (Week 1): Progressing toward goal OT Short Term Goal 2 (Week 1): Pt will transfer to [facility] with MOD A of 1 and LRAD OT Short Term Goal 2 - Progress (Week 1): Progressing toward goal OT Short Term Goal 3 (Week 1): Pt will sit to stand with MIN A for balance in prep for clothing managmnet OT Short Term Goal 3 - Progress (Week 1): Progressing toward goal OT Short Term Goal 4 (Week 1): Pt will locate 2 items on R of sink wiht min VC OT Short Term Goal 4 - Progress (Week 1): Progressing toward goal  Skilled Therapeutic Interventions/Progress Updates:    1:1. No pain reported. Pt seated in w.c with interpreter present upon arrival. Pt stands at high low table with MOD A for sit to stand and up to MAX A for dynamic standing balance with knee block to work on postural control/dynamic balance while crossing midline to place cup with LUE and OT facilitates WB into R hand for NMR. Pt with limited trunk rotation in B directions requiring manual facilitation. Pt completes seated reaching activities to improve trunk control reaching above head and laterally for objects to improve, trunk lengthening, sitting balance and postural control with mod-total A for sitting balance at mod-max ranges outside BOS. Pt able to sit statically at [facility] with supervision in between activities/during rest periods. Exited session with pt seated in bed, call light in reach and all needs met  Therapy Documentation Precautions:  Precautions Precautions: Fall Precaution Comments: non-English speaking Restrictions Weight Bearing Restrictions: No General:   \ See  Function Navigator for Current Functional Status.   Therapy/Group: Individual Therapy  Tonny Branch 03/04/2018, 2:13 PM

## 2018-03-04 NOTE — Progress Notes (Signed)
Speech Language Pathology Weekly Progress and Session Note  Patient Details  Name: Jorge Burns MRN: [id number] Date of Birth: 06/18/1941  Beginning of progress report period: February 25, 2018 End of progress report period: March 04, 2018  Today's Date: 03/04/2018 SLP Individual Time: 1300-1330 SLP Individual Time Calculation (min): 30 min  Short Term Goals: Week 2: SLP Short Term Goal 1 (Week 2): Pt will convey wants/needs with use of multimodal communication ( gestures, facial expressions) with Max A multimodal cues.  SLP Short Term Goal 1 - Progress (Week 2): Not met SLP Short Term Goal 2 (Week 2): Patient will vocalize at the CV level in 50% of opportunities with Max A multimodal cues.  SLP Short Term Goal 2 - Progress (Week 2): Not met SLP Short Term Goal 3 (Week 2): Pt will follow 1 step functional directions related to self with 75% accuracy and Mod A multimodal cues SLP Short Term Goal 3 - Progress (Week 2): Met SLP Short Term Goal 4 (Week 2): Patient will identify functional items from a field of 2 with 50% accuracy and Max A multimodal cues.  SLP Short Term Goal 4 - Progress (Week 2): Met SLP Short Term Goal 5 (Week 2): Pt will consume trials of ice chips/thin liquids via TSP  with minimal overt s/s aspiration and Mod A verbal cues over 2 sessions to demonstrate readiness for repeat MBS.  SLP Short Term Goal 5 - Progress (Week 2): Not met SLP Short Term Goal 6 (Week 2): Pt will demonstrate efficient mastication and complete oral clearance of dys 2 texture trials without overt s/s aspiration and Min A verbal cues over 2 sessions prior to upgrade.  SLP Short Term Goal 6 - Progress (Week 2): Not met    New Short Term Goals: Week 3: SLP Short Term Goal 1 (Week 3): Pt will convey wants/needs with use of multimodal communication ( gestures, facial expressions) with Max A multimodal cues.  SLP Short Term Goal 2 (Week 3): Patient will vocalize at the CV level in 50% of opportunities with Max A  multimodal cues.  SLP Short Term Goal 3 (Week 3): Patient will identify functional items from a field of 2 with 75% accuracy and Max A multimodal cues.  SLP Short Term Goal 4 (Week 3): Pt will follow 1 step functional directions related to self with 75% accuracy and Min A multimodal cues SLP Short Term Goal 5 (Week 3): Pt will consume trials of ice chips/thin liquids via TSP  with minimal overt s/s aspiration and Mod A verbal cues over 2 sessions to demonstrate readiness for repeat MBS.  SLP Short Term Goal 6 (Week 3): Pt will demonstrate efficient mastication and complete oral clearance of dys 2 texture trials without overt s/s aspiration and Min A verbal cues over 2 sessions prior to upgrade.   Weekly Progress Updates: Patient continues to make slow gains and has met 2 of 6 STG's this reporting period. Patient is currently consuming Dys. 1 textures with nectar-thick liquids with intermittent overt s/s of aspiration and Min A verbal cues for small bites/sips. Patient also continues to demonstrate overt s/s of aspiration with trials of ice chips and moderate anterior spillage with trials of Dys. 2 textures. Patient requires total A for use of multimodal communication to express basic wants/needs and remains nonverbal without ability to utilize a Data processing manager. However, patient demonstrates increased auditory comprehension indicated by increased ability to follow commands and identify functional items from a field of 2. Patient/family education  ongoing. Patient would benefit from continued skilled SLP intervention to maximize his cognitive-linguistic and swallowing function prior to discharge.      Intensity: Minumum of 1-2 x/day, 30 to 90 minutes Frequency: 3 to 5 out of 7 days Duration/Length of Stay: 03/11/18 Treatment/Interventions: Cognitive remediation/compensation;Cueing hierarchy;Dysphagia/aspiration precaution training;Functional tasks;Patient/family education;Therapeutic  Activities;Environmental controls;Speech/Language facilitation   Daily Session  Skilled Therapeutic Interventions: Skilled treatment session focused on communication goals. SLP facilitated session by providing total A multimodal cues for patient to identify functional pictures on a basic communication board from a field of 4. Patient also required Max A to answer yes/no questions with 25% accuracy in regards to basic biographical information with use of a yes/no picture communication board. Patient answered yes to all questions. Patient left upright in wheelchair with all needs within reach. Continue with current plan of care.      Function:    Cognition Comprehension Comprehension assist level: Understands basic less than 25% of the time/ requires cueing >75% of the time  Expression   Expression assist level: Expresses basis less than 25% of the time/requires cueing >75% of the time.  Social Interaction Social Interaction assist level: Interacts appropriately 25 - 49% of time - Needs frequent redirection.  Problem Solving Problem solving assist level: Solves basic less than 25% of the time - needs direction nearly all the time or does not effectively solve problems and may need a restraint for safety  Memory Memory assist level: Recognizes or recalls less than 25% of the time/requires cueing greater than 75% of the time   Pain No/Denies Pain   Therapy/Group: Individual Therapy  Joell Usman 03/04/2018, 3:58 PM

## 2018-03-04 NOTE — Progress Notes (Signed)
PHYSICAL MEDICINE & REHABILITATION     PROGRESS NOTE  Subjective/Complaints:   Eating Breakfast , laughs but no other verbalization ROS: Limited due to aphasia   Objective: Vital Signs: Blood pressure 126/73, pulse 75, temperature 97.7 F (36.5 C), resp. rate 12, weight 54.1 kg (119 lb 4.3 oz), SpO2 98 %. No results found. No results for input(s): WBC, HGB, HCT, PLT in the last 72 hours. No results for input(s): NA, K, CL, GLUCOSE, BUN, CREATININE, CALCIUM in the last 72 hours.  Invalid input(s): CO CBG (last 3)  No results for input(s): GLUCAP in the last 72 hours.  Wt Readings from Last 3 Encounters:  03/02/18 54.1 kg (119 lb 4.3 oz)  02/13/18 57.2 kg (126 lb 1.7 oz)  02/11/18 63.5 kg (140 lb)    Physical Exam:  BP 126/73 (BP Location: Left Arm)   Pulse 75   Temp 97.7 F (36.5 C)   Resp 12   Wt 54.1 kg (119 lb 4.3 oz)   SpO2 98%   BMI 19.25 kg/m  Constitutional: No distress . Vital signs reviewed. HEENT: EOMI, oral membranes moist Neck: supple Cardiovascular: RRR without murmur. No JVD    Respiratory: CTA Bilaterally without wheezes or rales. Normal effort    GI: BS +, non-tender, non-distended  Musculoskeletal:No edema.  No tenderness. Neurological: Non-English-speaking [place] male.  ?aphasia + language.  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  Spontaneously moving LUE/LLE  Skin: warm and dry. Intact. Psychiatric:Flat  Assessment/Plan: 1. Functional deficits secondary to multifocal bilateral infarcts which require 3+ hours per day of interdisciplinary therapy in a comprehensive inpatient rehab setting. Physiatrist is providing close team supervision and 24 hour management of active medical problems listed below. Physiatrist and rehab team continue to assess barriers to discharge/monitor patient progress toward functional and medical goals.  Function:  Bathing Bathing position   Position: Shower  Bathing  parts Body parts bathed by patient: Right arm, Chest, Abdomen, Front perineal area, Right upper leg, Left upper leg, Left lower leg Body parts bathed by helper: Left arm, Buttocks, Left lower leg  Bathing assist Assist Level: Touching or steadying assistance(Pt > 75%)      Upper Body Dressing/Undressing Upper body dressing   What is the patient wearing?: Pull over shirt/dress     Pull over shirt/dress - Perfomed by patient: Thread/unthread left sleeve, Put head through opening, Pull shirt over trunk Pull over shirt/dress - Perfomed by helper: Thread/unthread right sleeve        Upper body assist Assist Level: Touching or steadying assistance(Pt > 75%)      Lower Body Dressing/Undressing Lower body dressing   What is the patient wearing?: Pants, Non-skid slipper socks   Underwear - Performed by helper: Thread/unthread right underwear leg, Thread/unthread left underwear leg, Pull underwear up/down Pants- Performed by patient: Thread/unthread left pants leg, Pull pants up/down Pants- Performed by helper: Thread/unthread right pants leg Non-skid slipper socks- Performed by patient: Don/doff left sock Non-skid slipper socks- Performed by helper: Don/doff right sock                  Lower body assist Assist for lower body dressing: Touching or steadying assistance (Pt > 75%)      Toileting Toileting Toileting activity did not occur: No continent bowel/bladder event Toileting steps completed by patient: Adjust clothing after toileting Toileting steps completed by helper: Adjust clothing prior to toileting, Performs perineal hygiene, Adjust clothing after toileting    Toileting assist Assist  level: Touching or steadying assistance (Pt.75%)   Transfers Chair/bed transfer   Chair/bed transfer method: Squat pivot Chair/bed transfer assist level: Moderate assist (Pt 50 - 74%/lift or lower) Chair/bed transfer assistive device: Armrests, Bedrails Mechanical lift: Stedy    Locomotion Ambulation Ambulation activity did not occur: Safety/medical concerns   Max distance: 30' Assist level: Moderate assist (Pt 50 - 74%)   Wheelchair   Type: Manual Max wheelchair distance: 150' Assist Level: Supervision or verbal cues  Cognition Comprehension Comprehension assist level: Understands basic less than 25% of the time/ requires cueing >75% of the time  Expression Expression assist level: Expresses basis less than 25% of the time/requires cueing >75% of the time.  Social Interaction Social Interaction assist level: Interacts appropriately 25 - 49% of time - Needs frequent redirection.  Problem Solving Problem solving assist level: Solves basic less than 25% of the time - needs direction nearly all the time or does not effectively solve problems and may need a restraint for safety  Memory Memory assist level: Recognizes or recalls less than 25% of the time/requires cueing greater than 75% of the time    Medical Problem List and Plan: 1.Right-sided weakness and slurred speechsecondary to multifocal acute ischemia within the left hemisphere, predominantly within the deep watershed zone and left ACA/MCA cortical watershed zone. Small subacute infarct within the right side white matter adjacent to the corpus callosum splenium on 5/26 On ASA/Plavix  Continue CIR PT, OT, SLP-  2. DVT Prophylaxis/Anticoagulation: Subcutaneous heparin 3. Pain Management:Tylenol as needed 4. Mood:Provide emotional support 5. Neuropsych: This patientis? capable of making decisions on hisown behalf. 6. Skin/Wound Care:Routine skin checks 7. Fluids/Electrolytes/Nutrition:Routine in and outs  BMP within acceptable range on 6/3 Continue to encourage PO, I 480 ml recorded with meals yesterday 8.ICA stenosis.Follow up IR for possibleICA revascularization. Procedure based on timeframe of rehab services 9.Hyperlipidemia. Lipitor 10.  Hypertension  Lisinopril 2.5 mg  daily Vitals:   03/03/18 2050 03/04/18 0535  BP: 109/64 126/73  Pulse: 81 75  Resp: 14 12  Temp: 97.7 F (36.5 C)   SpO2: 96% 98%  good control 6/14  11. anemia-resolved  Hb 12.6 on 5/31  Recheck 6/3 13.5 12. Postoperative dysphagia  D1 nectar liquids-continue to push p.o.  Advance diet as tolerated 13.  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

## 2018-03-04 NOTE — Progress Notes (Signed)
Occupational Therapy Weekly Progress Note  Patient Details  Name: Jorge Burns MRN: [id number] Date of Birth: December 20, 1940  Beginning of progress report period: February 19, 2018 End of progress report period: March 04, 2018  Today's Date: 03/04/2018 OT Individual Time: 0900-1000 OT Individual Time Calculation (min): 60 min    Patient has met 3 of 4 short term goals.  Pt is making progress towards OT goals at this time. He has improved sit<>stands and can sit<>stand with min A in Stedy and mod A with L knee block without Stedy. Transfers are a more consistent mod A, but he still needs occasional max A 2/2 apraxia, pushing and lateral lean. Pt has demonstrated some improved R attention, little to no motor recovery in R UE, with some increased flexor tone.   Patient continues to demonstrate the following deficits: muscle weakness, impaired timing and sequencing, abnormal tone, unbalanced muscle activation, motor apraxia, ataxia, decreased coordination and decreased motor planning, decreased midline orientation, decreased attention to right, right side neglect, decreased motor planning and ideational apraxia, decreased initiation, decreased attention, decreased awareness, decreased problem solving, decreased safety awareness, decreased memory and delayed processing and decreased sitting balance, decreased standing balance, decreased postural control, hemiplegia and decreased balance strategies and therefore will continue to benefit from skilled OT intervention to enhance overall performance with BADL and Reduce care partner burden.  Patient progressing toward long term goals..  Continue plan of care.  OT Short Term Goals Week 3:  OT Short Term Goal 1 (Week 3): Pt will complete toilet transfer with mod A OT Short Term Goal 2 (Week 3): Pt will maintain balance with mod A in prep for clothing managent when toileting OT Short Term Goal 3 (Week 3): Pt will use grooming objects appropriately with no more than min  verbal cues  Skilled Therapeutic Interventions/Progress Updates:    Pt greeted semi-reclined in bed with interpreter present. Asked pt if he needed to go to the bathroom, but he was unable to communicate any response. Stedy used to transfer pt onto toilet with min A to stand. Pt with continent void of urine in commode. Stedy then used to transfer pt into shower. Pt with improved sitting balance seated on tub bench needing supervision for static sitting and min A for dynamic balance. Worked on anterior weight shift with reaching forward to wash LEs. Pt with small smear of incontinent BM in shower requiring max A to wash buttocks. Dressing completed wc level at the sink with focus on R attention, apraxia, sit<>stand and standing balance. Pt perseverative on last dressing step, continuing to try to thread UE into pant leg. Had to stop and redirect pt, then provide hand over hand A to initiate threading pants before pt understood. Sit<>stand without Stedy with min A, then needed mod/max A for dynamic balance when trying to pull up his pants 2/2 posterior lean and push to R. Pt left seated in wc at end of session with safety belt on and interpreter present.   Therapy Documentation Precautions:  Precautions Precautions: Fall Precaution Comments: non-English speaking Restrictions Weight Bearing Restrictions: No Pain:   none/denies pain See Function Navigator for Current Functional Status.   Therapy/Group: Individual Therapy  Valma Cava 03/04/2018, 6:57 AM

## 2018-03-05 ENCOUNTER — Inpatient Hospital Stay (HOSPITAL_COMMUNITY): Payer: Medicare Other | Admitting: Occupational Therapy

## 2018-03-05 NOTE — Plan of Care (Signed)
  Problem: RH SAFETY Goal: RH STG ADHERE TO SAFETY PRECAUTIONS W/ASSISTANCE/DEVICE Description STG Adhere to Safety Precautions With mod. Assistance/Device.  Outcome: Progressing  Call light at hand, bed/chair alarm, proper footwear  

## 2018-03-05 NOTE — Progress Notes (Signed)
Occupational Therapy Session Note  Patient Details  Name: Jorge Burns MRN: [id number] Date of Birth: 1941/07/15  Today's Date: 03/05/2018 OT Individual Time: 1400-1445 OT Individual Time Calculation (min): 45 min   Short Term Goals: Week 3:  OT Short Term Goal 1 (Week 3): Pt will complete toilet transfer with mod A OT Short Term Goal 2 (Week 3): Pt will maintain balance with mod A in prep for clothing managent when toileting OT Short Term Goal 3 (Week 3): Pt will use grooming objects appropriately with no more than min verbal cues  Skilled Therapeutic Interventions/Progress Updates:    Pt greeted semi-reclined in bed with interpreter present. Pt brought into bathroom with stedy with min A. Pt needed assistance for clothing management 2/2 severe posterior lean and push to R with any increased task demand. Pt with successful BM and voided bladder. Tryd to engage pt in toileting task but he was unable to initiate and motor plan wiping with L hand. Dressing completed wc at the sink with focus on looking at R side and trying to dress R side first. OT placed deodorant in R hand and placed at midline, then pt able to follow commands to remove twist top. Sit<>stand at the sink with min A, but required mod/max A for balance when removing L UE from sink to try to pull up pants. Pt brought to therapy gym for R UE NMR with weight bearing towel pushes. Minimal activation noted. Pt returned to room and left seated in wc with safety belt, chair alarm, and needs met.   Therapy Documentation Precautions:  Precautions Precautions: Fall Precaution Comments: non-English speaking Restrictions Weight Bearing Restrictions: No Pain:   none/denies pain See Function Navigator for Current Functional Status.   Therapy/Group: Individual Therapy  Valma Cava 03/05/2018, 2:45 PM

## 2018-03-05 NOTE — Progress Notes (Signed)
[place] PHYSICAL MEDICINE & REHABILITATION     PROGRESS NOTE  Subjective/Complaints:   Patient lying in bed smiling.  He nods his head yes and no but I do not think he is understanding the questions no other verbalizations.   Objective: Vital Signs: Blood pressure 118/66, pulse 83, temperature 97.8 F (36.6 C), temperature source Oral, resp. rate 17, weight 120 lb 13 oz (54.8 kg), SpO2 95 %.   Physical Exam:  BP 118/66   Pulse 83   Temp 97.8 F (36.6 C) (Oral)   Resp 17   Wt 120 lb 13 oz (54.8 kg)   SpO2 95%   BMI 19.50 kg/m    well-developed well-nourished male in no acute distress.  Lying in bed.  HEENT exam atraumatic, normocephalic, neck supple without jugular venous distention. Chest clear to auscultation cardiac exam S1-S2 are regular. Abdominal exam overweight with bowel sounds, soft and nontender. Extremities no edema. Neurologic exam is alert.  Aphasic.  Assessment/Plan: 1. Functional deficits secondary to multifocal bilateral infarcts Medical Problem List and Plan: 1. Multifocal acute ischemic stroke within the left hemisphere.  Appears to be a watershed stroke.  He is on aspirin and Plavix.  Continue inpatient rehab.  2. DVT Prophylaxis/Anticoagulation: Subcutaneous heparin 3. Pain Management:Tylenol as needed 4. Mood:Provide emotional support 5. Neuropsych: It is not clear to me that the patient can make his own decisions. 6. Skin/Wound Care:Routine skin checks 7. Fluids/Electrolytes/Nutrition:Routine in and outs   Basic Metabolic Panel:    Component Value Date/Time   NA 139 02/21/2018 0720   K 3.9 02/21/2018 0720   CL 103 02/21/2018 0720   CO2 28 02/21/2018 0720   BUN 12 02/21/2018 0720   CREATININE 1.09 02/21/2018 0720   GLUCOSE 107 (H) 02/21/2018 0720   CALCIUM 9.4 02/21/2018 0720    8.ICA stenosis.Follow up IR for possibleICA revascularization. Procedure based on timeframe of rehab services 9.Hyperlipidemia.  Lipid Panel      Component Value Date/Time   CHOL 213 (H) 02/13/2018 0855   TRIG 98 02/13/2018 0855   HDL 42 02/13/2018 0855   CHOLHDL 5.1 02/13/2018 0855   VLDL 20 02/13/2018 0855   LDLCALC 151 (H) 02/13/2018 0855  On atorvastatin.  10.  Hypertension  Lisinopril 2.5 mg daily Vitals:   03/05/18 0541 03/05/18 0748  BP: 130/71 118/66  Pulse: 75 83  Resp: 17   Temp: 97.8 F (36.6 C)   SpO2: 97% 95%  good control 6/15  11.  Anemia resolved.   12. Dysphagia continue D1 nectar liquids advance diet as tolerated.   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

## 2018-03-06 ENCOUNTER — Inpatient Hospital Stay (HOSPITAL_COMMUNITY): Payer: Medicare Other

## 2018-03-06 NOTE — Progress Notes (Signed)
Physical Therapy Session Note  Patient Details  Name: Jorge Burns MRN: [id number] Date of Birth: 09/28/1940  Today's Date: 03/06/2018 PT Individual Time: 0900-0945 PT Individual Time Calculation (min): 45 min   Short Term Goals: Week 3:  PT Short Term Goal 1 (Week 3): =LTGs due to ELOS  Skilled Therapeutic Interventions/Progress Updates:    Pt supine in bed upon PT arrival, agreeable to therapy tx and denies pain. Interpreter present throughout session. Therapist assisted pt to loop LEs through pants, pt able to bridge to pull over hips. Pt transferred from supine>sitting EOB with min assist, once in sitting pt with increased pushing posteriorly with L UE on bedrail, verbal/tactile cues to correct. Pt transferred stand pivot from bed>w/c with mod assist. Pt propelled w/c to the gym with min assist, verbal cues for R attention and obstacle avoidance. Pt transferred w/c>mat with min assist, verbal/tactile cues for anterior weightshift. Pt worked on standing balance and postural control this session, performing multiple sit<>stands throughout session from mat. Pt performs sit<>stand by pulling up with L UE with min assist, when L UE support is taken away pt with increased posterior/R lateral pushing requiring mod-max assist. Pt worked on dynamic standing balance reaching for horseshoes, increased posterior/R lateral pushing. Pt able to maintain standing balance with L UE support with min guard assist, pt able to take hand off UE support for 3-5 sec with min assist, pt then either reaches back for UE support or loses balance posteriorly and sits back on mat. Pt transferred back to w/c with min assist, transported back to room and left seated with QRB in place and chair alarm set, needs in reach.   Therapy Documentation Precautions:  Precautions Precautions: Fall Precaution Comments: non-English speaking Restrictions Weight Bearing Restrictions: No   See Function Navigator for Current Functional  Status.   Therapy/Group: Individual Therapy  Cresenciano GenreEmily van Schagen, PT, DPT 03/06/2018, 7:42 AM

## 2018-03-06 NOTE — Progress Notes (Signed)
[place] PHYSICAL MEDICINE & REHABILITATION     PROGRESS NOTE  Subjective/Complaints:   Patient lying in bed smiling.  No other verbalization.   Objective:  Physical Exam:  BP 125/81 (BP Location: Left Arm)   Pulse 68   Temp (!) 97.5 F (36.4 C) (Axillary) Comment (Src): pt couldnt keep in mouth  Resp 17   Wt 120 lb 13 oz (54.8 kg)   SpO2 98%   BMI 19.50 kg/m   No acute distress HEENT exam: Atraumatic, normocephalic, extractor muscles are intact Chest clear to auscultation Cardiac exam S1-S2 regular Abdominal exam active bowel sounds, soft Extremities without edema.   Lab test: No new lab tests Imaging test: No new imaging tests Assessment/Plan: 1. Functional deficits secondary to multifocal bilateral infarcts Medical Problem List and Plan: 1. Multifocal acute ischemic stroke within the left hemisphere.  Appears to be a watershed stroke.  He is on aspirin and Plavix.  Continue inpatient rehab.  2. DVT Prophylaxis/Anticoagulation: Subcutaneous heparin 3. Pain Management:Tylenol as needed 4. Mood:Provide emotional support 5. Neuropsych: It is not clear to me that the patient can make his own decisions. 6. Skin/Wound Care:Routine skin checks 7. Fluids/Electrolytes/Nutrition:Routine in and outs   Basic Metabolic Panel:    Component Value Date/Time   NA 139 02/21/2018 0720   K 3.9 02/21/2018 0720   CL 103 02/21/2018 0720   CO2 28 02/21/2018 0720   BUN 12 02/21/2018 0720   CREATININE 1.09 02/21/2018 0720   GLUCOSE 107 (H) 02/21/2018 0720   CALCIUM 9.4 02/21/2018 0720    8.ICA stenosis.consider intervention at a later date. 9.Hyperlipidemia.  Lipid Panel     Component Value Date/Time   CHOL 213 (H) 02/13/2018 0855   TRIG 98 02/13/2018 0855   HDL 42 02/13/2018 0855   CHOLHDL 5.1 02/13/2018 0855   VLDL 20 02/13/2018 0855   LDLCALC 151 (H) 02/13/2018 0855  Continue Lipitor.  10.  Hypertension  Controlled. Vitals:   03/05/18 1958 03/06/18 0507   BP: 124/68 125/81  Pulse: 80 68  Resp: 17 17  Temp: 97.6 F (36.4 C) (!) 97.5 F (36.4 C)  SpO2: 99% 98%    11.  Anemia resolved.  Lab Results  Component Value Date   HGB 13.5 02/21/2018      12. Dysphagia continue D1 nectar liquids advance diet as tolerated.   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

## 2018-03-07 ENCOUNTER — Inpatient Hospital Stay (HOSPITAL_COMMUNITY): Payer: Medicare Other | Admitting: Speech Pathology

## 2018-03-07 ENCOUNTER — Inpatient Hospital Stay (HOSPITAL_COMMUNITY): Payer: Medicare Other | Admitting: Occupational Therapy

## 2018-03-07 ENCOUNTER — Inpatient Hospital Stay (HOSPITAL_COMMUNITY): Payer: Medicare Other

## 2018-03-07 MED ORDER — METHYLPHENIDATE HCL 5 MG PO TABS
10.0000 mg | ORAL_TABLET | Freq: Two times a day (BID) | ORAL | Status: DC
  Administered 2018-03-08 – 2018-03-12 (×9): 10 mg via ORAL
  Filled 2018-03-07 (×9): qty 2

## 2018-03-07 MED ORDER — ENOXAPARIN SODIUM 40 MG/0.4ML ~~LOC~~ SOLN
40.0000 mg | SUBCUTANEOUS | Status: DC
  Administered 2018-03-08 – 2018-03-11 (×4): 40 mg via SUBCUTANEOUS
  Filled 2018-03-07 (×4): qty 0.4

## 2018-03-07 NOTE — Progress Notes (Addendum)
Physical Therapy Session Note  Patient Details  Name: Jorge Burns MRN: [id number] Date of Birth: 03/31/1941  Today's Date: 03/07/2018 PT Individual Time: 1000-1058 and 1300-1330 PT Individual Time Calculation (min): 58 min and 30 min   Short Term Goals: Week 3:  PT Short Term Goal 1 (Week 3): =LTGs due to ELOS  Skilled Therapeutic Interventions/Progress Updates:   Session 1:  Pt seated in w/c upon PT arrival, agreeable to therapy tx and denies pain. Pt transported to the gym. Pt performed blocked practice transfers from w/c<>mat stand pivot x 4 in each direction fading to CGA by the last trial, verbal/tactile cues for techniques. Pt worked on static and dynamic standing balance without UE support in order to reach for bean bags to toss, emphasis on reaching towards L side to minimize R lateral pushing, min-mod assist for balance, x 3 trials. Pt performed x 20 sit<>stands with UE support in standing with min assist, performed x 5 sit<>stands without UE support with mod assist and increased posterior lean, manual facilitation for increased anterior weightshift. Pt performed x 10 sit<>stands with 2 inch step under L LE for increased R LE weightbearing for neuro re-ed, manual facilitation for increased weightbearing through R LE, mod assist with L UE support. Therapist notes R quadriceps activation during sit<>stands however pt unable to activate on command, hamstring activation also noted spontaneously. Pt performed x 3 stand pivot transfers from mat<>w/c in order to assess for carryover from beginning of session, min assist. Pt transported back to room and left seated in room with needs in reach, QRB in place and chair alarm set.   Session 2: Pt seated in w/c upon PT arrival, agreeable to therapy tx and denies pain. Pt transported to the gym. Pt performed stand pivot from w/c>mat with min assist, verbal cues for techniques. Pt ambulated x 18 ft and x 25 ft this session with hemi walker, mod assist and +2  for w/c follow for safety. During ambulation therapist providing assist for R LE stance control and for advancement of R LE during swing, pt demonstrates motor apraxia limiting ability to sequence gait and activate R LE musculature. Pt transported back and handed off the ST for next session.    Therapy Documentation Precautions:  Precautions Precautions: Fall Precaution Comments: non-English speaking Restrictions Weight Bearing Restrictions: No   See Function Navigator for Current Functional Status.   Therapy/Group: Individual Therapy  Cresenciano GenreEmily van Schagen, PT, DPT 03/07/2018, 7:51 AM

## 2018-03-07 NOTE — Progress Notes (Signed)
Patient had bleeding from site of SQ heparin injection given at 1500 hrs.  Ecchymotic area with small hematoma noted RLQ---non tender to touch. Bleeding stopped by time of exam. Pressure dressing applied with recommendations to ice area. Will hold HS dose. Bleeding likely due to  Change to Lovenox daily starting tomorrow afternoon. Will check CBC in am.

## 2018-03-07 NOTE — Progress Notes (Signed)
[facility] PHYSICAL MEDICINE & REHABILITATION     PROGRESS NOTE  Subjective/Complaints:   Remains globally aphasic but does smile  ROS: Limited due to aphasia   Objective: Vital Signs: Blood pressure 112/70, pulse 77, temperature 98.7 F (37.1 C), temperature source Oral, resp. rate 18, weight 54.8 kg (120 lb 13 oz), SpO2 100 %. No results found. No results for input(s): WBC, HGB, HCT, PLT in the last 72 hours. No results for input(s): NA, K, CL, GLUCOSE, BUN, CREATININE, CALCIUM in the last 72 hours.  Invalid input(s): CO CBG (last 3)  No results for input(s): GLUCAP in the last 72 hours.  Wt Readings from Last 3 Encounters:  03/04/18 54.8 kg (120 lb 13 oz)  02/13/18 57.2 kg (126 lb 1.7 oz)  02/11/18 63.5 kg (140 lb)    Physical Exam:  BP 112/70 (BP Location: Left Arm)   Pulse 77   Temp 98.7 F (37.1 C) (Oral)   Resp 18   Wt 54.8 kg (120 lb 13 oz)   SpO2 100%   BMI 19.50 kg/m  Constitutional: No distress . Vital signs reviewed. HEENT: EOMI, oral membranes moist Neck: supple Cardiovascular: RRR without murmur. No JVD    Respiratory: CTA Bilaterally without wheezes or rales. Normal effort    GI: BS +, non-tender, non-distended  Musculoskeletal:No edema.  No tenderness. Neurological: Non-English-speaking [place] male.  ?aphasia + language.  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  Spontaneously moving LUE/LLE  Skin: warm and dry. Intact. Psychiatric:Flat  Assessment/Plan: 1. Functional deficits secondary to multifocal bilateral infarcts which require 3+ hours per day of interdisciplinary therapy in a comprehensive inpatient rehab setting. Physiatrist is providing close team supervision and 24 hour management of active medical problems listed below. Physiatrist and rehab team continue to assess barriers to discharge/monitor patient progress toward functional and medical goals.  Function:  Bathing Bathing position   Position:  Shower  Bathing parts Body parts bathed by patient: Right arm, Chest, Abdomen, Front perineal area, Right upper leg, Left upper leg, Left lower leg Body parts bathed by helper: Left arm, Buttocks, Left lower leg  Bathing assist Assist Level: Touching or steadying assistance(Pt > 75%)      Upper Body Dressing/Undressing Upper body dressing   What is the patient wearing?: Pull over shirt/dress     Pull over shirt/dress - Perfomed by patient: Thread/unthread left sleeve, Put head through opening Pull over shirt/dress - Perfomed by helper: Pull shirt over trunk, Thread/unthread right sleeve        Upper body assist Assist Level: Touching or steadying assistance(Pt > 75%)      Lower Body Dressing/Undressing Lower body dressing   What is the patient wearing?: Pants, Non-skid slipper socks   Underwear - Performed by helper: Thread/unthread right underwear leg, Thread/unthread left underwear leg, Pull underwear up/down Pants- Performed by patient: Thread/unthread left pants leg, Pull pants up/down Pants- Performed by helper: Thread/unthread right pants leg Non-skid slipper socks- Performed by patient: Don/doff left sock Non-skid slipper socks- Performed by helper: Don/doff right sock                  Lower body assist Assist for lower body dressing: Touching or steadying assistance (Pt > 75%)      Toileting Toileting Toileting activity did not occur: No continent bowel/bladder event Toileting steps completed by patient: Adjust clothing after toileting Toileting steps completed by helper: Adjust clothing prior to toileting, Performs perineal hygiene, Adjust clothing after toileting  Toileting assist Assist level: Touching or steadying assistance (Pt.75%)   Transfers Chair/bed transfer   Chair/bed transfer method: Stand pivot Chair/bed transfer assist level: Touching or steadying assistance (Pt > 75%) Chair/bed transfer assistive device: Armrests Mechanical lift: Stedy    Locomotion Ambulation Ambulation activity did not occur: Safety/medical concerns   Max distance: 25 ft Assist level: 2 helpers   Wheelchair   Type: Manual Max wheelchair distance: 150' Assist Level: Supervision or verbal cues  Cognition Comprehension Comprehension assist level: Understands basic less than 25% of the time/ requires cueing >75% of the time  Expression Expression assist level: Expresses basis less than 25% of the time/requires cueing >75% of the time.  Social Interaction Social Interaction assist level: Interacts appropriately less than 25% of the time. May be withdrawn or combative.  Problem Solving Problem solving assist level: Solves basic less than 25% of the time - needs direction nearly all the time or does not effectively solve problems and may need a restraint for safety  Memory Memory assist level: Recognizes or recalls 25 - 49% of the time/requires cueing 50 - 75% of the time    Medical Problem List and Plan: 1.Right-sided weakness and slurred speechsecondary to multifocal acute ischemia within the left hemisphere, predominantly within the deep watershed zone and left ACA/MCA cortical watershed zone. Small subacute infarct within the right side white matter adjacent to the corpus callosum splenium on 5/26 On ASA/Plavix  Continue CIR PT, OT, SLP-  2. DVT Prophylaxis/Anticoagulation: Subcutaneous heparin change to lovenox to reduce freq of sq inj 3. Pain Management:Tylenol as needed 4. Mood:Provide emotional support 5. Neuropsych: This patientis? capable of making decisions on hisown behalf. 6. Skin/Wound Care:Routine skin checks 7. Fluids/Electrolytes/Nutrition:Routine in and outs  BMP within acceptable range on 6/3 Continue to encourage PO, I 600 ml recorded with meals yesterday 8.ICA stenosis.Follow up IR for possibleICA revascularization. Procedure based on timeframe of rehab services 9.Hyperlipidemia. Lipitor 10.   Hypertension  Lisinopril 2.5 mg daily Vitals:   03/07/18 0458 03/07/18 1501  BP: 107/67 112/70  Pulse: 79 77  Resp: 17 18  Temp:  98.7 F (37.1 C)  SpO2: 96% 100%  good control 6/14  11. anemia-resolved  Hb 12.6 on 5/31  Recheck 6/3 13.5 12. Postoperative dysphagia  D1 nectar liquids-continue to push p.o.  Advance diet as tolerated 13.  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

## 2018-03-07 NOTE — Progress Notes (Signed)
Speech Language Pathology Daily Session Note  Patient Details  Name: Jorge Burns Speaker MRN: [id number] Date of Birth: 03/05/1941  Today's Date: 03/07/2018 SLP Individual Time: 1330-1415 SLP Individual Time Calculation (min): 45 min  Short Term Goals: Week 3: SLP Short Term Goal 1 (Week 3): Pt will convey wants/needs with use of multimodal communication ( gestures, facial expressions) with Max A multimodal cues.  SLP Short Term Goal 2 (Week 3): Patient will vocalize at the CV level in 50% of opportunities with Max A multimodal cues.  SLP Short Term Goal 3 (Week 3): Patient will identify functional items from a field of 2 with 75% accuracy and Max A multimodal cues.  SLP Short Term Goal 4 (Week 3): Pt will follow 1 step functional directions related to self with 75% accuracy and Min A multimodal cues SLP Short Term Goal 5 (Week 3): Pt will consume trials of ice chips/thin liquids via TSP  with minimal overt s/s aspiration and Mod A verbal cues over 2 sessions to demonstrate readiness for repeat MBS.  SLP Short Term Goal 6 (Week 3): Pt will demonstrate efficient mastication and complete oral clearance of dys 2 texture trials without overt s/s aspiration and Min A verbal cues over 2 sessions prior to upgrade.   Skilled Therapeutic Interventions: Skilled treatment session focused on speech goals. SLP facilitated session by providing Max A multimodal cues to produce individual phonemes (m and a) on command. However, despite Max A multimodal cues, patient unable to produce CV combination (ma). SLP also facilitated session by providing Max A verbal cues for patient to answer basic yes/no questions via multimodal communication with use of basic communication board (yes/no). Patient's granddaughter present and educated in regards to patient's current cognitive-linguistic function and goals of skilled SLP intervention. She verbalized understanding and agreement. Patient left upright in wheelchair with NT present.  Continue with current plan of care.      Function:   Cognition Comprehension Comprehension assist level: Understands basic less than 25% of the time/ requires cueing >75% of the time  Expression   Expression assist level: Expresses basis less than 25% of the time/requires cueing >75% of the time.  Social Interaction Social Interaction assist level: Interacts appropriately less than 25% of the time. May be withdrawn or combative.  Problem Solving Problem solving assist level: Solves basic less than 25% of the time - needs direction nearly all the time or does not effectively solve problems and may need a restraint for safety  Memory Memory assist level: Recognizes or recalls 25 - 49% of the time/requires cueing 50 - 75% of the time    Pain No/Deines Pain   Therapy/Group: Individual Therapy  Crissa Sowder 03/07/2018, 2:56 PM

## 2018-03-07 NOTE — Progress Notes (Signed)
Occupational Therapy Session Note  Patient Details  Name: Jorge Burns MRN: [id number] Date of Birth: 01/18/1941  Today's Date: 03/07/2018 OT Individual Time: 0905-1000 OT Individual Time Calculation (min): 55 min    Short Term Goals: Week 3:  OT Short Term Goal 1 (Week 3): Pt will complete toilet transfer with mod A OT Short Term Goal 2 (Week 3): Pt will maintain balance with mod A in prep for clothing managent when toileting OT Short Term Goal 3 (Week 3): Pt will use grooming objects appropriately with no more than min verbal cues  Skilled Therapeutic Interventions/Progress Updates:    Pt greeted semi-reclined in bed with interpreter present and agreeable to OT treatment session. Asked if pt needed to go to the bathroom, he did not respond. Stedy used to transfer onto commode 2/2 severe pushing and difficulty managing clothing while keeping pt upright. Pt voided bladder on commode. Practiced squat-pivot transfer in and out of shower onto tub bench with max A to the R and Mod A to the L. Bathing completed shower level with hand over hand A to integrate R UE into bathing tasks. Dressing completed wc at the sink with max multimodal cues for object use, initiation and R attention. Worked on standing balance with facilitation for anterior weight shift while rinsing with mouth wash. R UE NMR standing at raised table with weight bearing towel pushes. Pt returned to room and left seated in wc with safety belt and interpreter present.   Therapy Documentation Precautions:  Precautions Precautions: Fall Precaution Comments: non-English speaking Restrictions Weight Bearing Restrictions: No Pain: None/denies pain See Function Navigator for Current Functional Status.   Therapy/Group: Individual Therapy  Mal Amabilelisabeth S Celeste Candelas 03/07/2018, 10:05 AM

## 2018-03-08 ENCOUNTER — Inpatient Hospital Stay (HOSPITAL_COMMUNITY): Payer: Medicare Other

## 2018-03-08 ENCOUNTER — Inpatient Hospital Stay (HOSPITAL_COMMUNITY): Payer: Medicare Other | Admitting: Speech Pathology

## 2018-03-08 ENCOUNTER — Inpatient Hospital Stay (HOSPITAL_COMMUNITY): Payer: Medicare Other | Admitting: Physical Therapy

## 2018-03-08 LAB — BASIC METABOLIC PANEL
Anion gap: 6 (ref 5–15)
BUN: 27 mg/dL — ABNORMAL HIGH (ref 6–20)
CO2: 28 mmol/L (ref 22–32)
Calcium: 9.5 mg/dL (ref 8.9–10.3)
Chloride: 106 mmol/L (ref 101–111)
Creatinine, Ser: 1.13 mg/dL (ref 0.61–1.24)
GFR calc Af Amer: >60 mL/min (ref >60)
GFR calc non Af Amer: >60 mL/min (ref >60)
Glucose, Bld: 113 mg/dL — ABNORMAL HIGH (ref 65–99)
Potassium: 4.2 mmol/L (ref 3.5–5.1)
Sodium: 140 mmol/L (ref 135–145)

## 2018-03-08 LAB — CBC
HCT: 39.2 % (ref 39.0–52.0)
Hemoglobin: 12.6 g/dL — ABNORMAL LOW (ref 13.0–17.0)
MCH: 24.9 pg — ABNORMAL LOW (ref 26.0–34.0)
MCHC: 32.1 g/dL (ref 30.0–36.0)
MCV: 77.3 fL — ABNORMAL LOW (ref 78.0–100.0)
Platelets: 296 10*3/uL (ref 150–400)
RBC: 5.07 MIL/uL (ref 4.22–5.81)
RDW: 15.9 % — ABNORMAL HIGH (ref 11.5–15.5)
WBC: 7.2 10*3/uL (ref 4.0–10.5)

## 2018-03-08 NOTE — Progress Notes (Signed)
Speech Language Pathology Daily Session Note  Patient Details  Name: Jorge Burns MRN: [id number] Date of Birth: 11/09/1940  Today's Date: 03/08/2018 SLP Individual Time: 1335-1420 SLP Individual Time Calculation (min): 45 min  Short Term Goals: Week 3: SLP Short Term Goal 1 (Week 3): Pt will convey wants/needs with use of multimodal communication ( gestures, facial expressions) with Max A multimodal cues.  SLP Short Term Goal 2 (Week 3): Patient will vocalize at the CV level in 50% of opportunities with Max A multimodal cues.  SLP Short Term Goal 3 (Week 3): Patient will identify functional items from a field of 2 with 75% accuracy and Max A multimodal cues.  SLP Short Term Goal 4 (Week 3): Pt will follow 1 step functional directions related to self with 75% accuracy and Min A multimodal cues SLP Short Term Goal 5 (Week 3): Pt will consume trials of ice chips/thin liquids via TSP  with minimal overt s/s aspiration and Mod A verbal cues over 2 sessions to demonstrate readiness for repeat MBS.  SLP Short Term Goal 6 (Week 3): Pt will demonstrate efficient mastication and complete oral clearance of dys 2 texture trials without overt s/s aspiration and Min A verbal cues over 2 sessions prior to upgrade.   Skilled Therapeutic Interventions: Skilled treatment session focused on communication goals. SLP facilitated session by making a simple communication board that utilized pictures and single words from a field of 4 ( 2 pictures on each side of paper).  Patient utilized Public affairs consultantcommunication board with 50% accuracy and Max A verbal cues. Patient's function with communication board appeared impacted by suspected visual deficits and motor apraxia. Patient transferred back to bed at end of session with Mod A verbal cues for safety. Patient left upright in bed with all needs within reach and alarm on. Continue with current plan of care.      Function:   Cognition Comprehension Comprehension assist level:  Understands basic less than 25% of the time/ requires cueing >75% of the time  Expression   Expression assist level: Expresses basis less than 25% of the time/requires cueing >75% of the time.  Social Interaction Social Interaction assist level: Interacts appropriately less than 25% of the time. May be withdrawn or combative.  Problem Solving Problem solving assist level: Solves basic less than 25% of the time - needs direction nearly all the time or does not effectively solve problems and may need a restraint for safety  Memory Memory assist level: Recognizes or recalls 25 - 49% of the time/requires cueing 50 - 75% of the time    Pain No/Denies Pain   Therapy/Group: Individual Therapy  [name] 03/08/2018, 2:31 PM

## 2018-03-08 NOTE — Progress Notes (Signed)
Occupational Therapy Session Note  Patient Details  Name: Jorge Burns MRN: [id number] Date of Birth: 10/18/1940  Today's Date: 03/08/2018 OT Individual Time: [id number] OT Individual Time Calculation (min): 58 min    Short Term Goals: Week 3:  OT Short Term Goal 1 (Week 3): Pt will complete toilet transfer with mod A OT Short Term Goal 2 (Week 3): Pt will maintain balance with mod A in prep for clothing managent when toileting OT Short Term Goal 3 (Week 3): Pt will use grooming objects appropriately with no more than min verbal cues  Skilled Therapeutic Interventions/Progress Updates:    Pt received supine in bed with interpretor present. When presented with option to complete bathing according to interpretor pt responded "yes" in native language. Pt completed stand pivot transfer into/out of shower with mod A. Tactile cues provided to bath UB/LB thoroughly, as well as HOH to incorporate R UE. Pt presenting with increased initiation this session/direction following. During UB dressing Pt was able to thread L UE and pull over head once R UE was threaded. Pt provided with demo re uni-manual sock donning and was unable to return demo or carryover. Once sock was initiated on R or L LE pt was able to pull sock on completely. Pt stood at sink with hemi walker to pull up pants and required mod A to stabilize static standing balance. Pt completed shaving task with close (S) for safety, correctly identifying grooming object. Pt was left in w/c with QRB donned and interpretor present.   Therapy Documentation Precautions:  Precautions Precautions: Fall Precaution Comments: non-English speaking Restrictions Weight Bearing Restrictions: No Pain: Pain Assessment Pain Scale: Faces Faces Pain Scale: No hurt  See Function Navigator for Current Functional Status.   Therapy/Group: Individual Therapy  Crissie ReeseSandra H Rakim Moone 03/08/2018, 10:00 AM

## 2018-03-08 NOTE — Progress Notes (Signed)
Social Work Patient ID: Engineer, manufacturing systemsHuoi Burns, male   DOB: 11/11/1940, 77 y.o.   MRN: [id number]  Spoke with granddaughter how reports they want to take pt home on Friday. Discussed the need to come in for education prior to Friday. She will check with her husband and get back with this worker.

## 2018-03-08 NOTE — Progress Notes (Signed)
[facility] PHYSICAL MEDICINE & REHABILITATION     PROGRESS NOTE  Subjective/Complaints:   Some abd inj site bleeding noted by PA yesterday, reviewed CBC- stable  ROS: Limited due to aphasia   Objective: Vital Signs: Blood pressure 108/70, pulse 75, temperature 99 F (37.2 C), temperature source Oral, resp. rate 16, weight 54.8 kg (120 lb 13 oz), SpO2 97 %. No results found. Recent Labs    03/08/18 0621  WBC 7.2  HGB 12.6*  HCT 39.2  PLT 296   Recent Labs    03/08/18 0621  NA 140  K 4.2  CL 106  GLUCOSE 113*  BUN 27*  CREATININE 1.13  CALCIUM 9.5   CBG (last 3)  No results for input(s): GLUCAP in the last 72 hours.  Wt Readings from Last 3 Encounters:  03/04/18 54.8 kg (120 lb 13 oz)  02/13/18 57.2 kg (126 lb 1.7 oz)  02/11/18 63.5 kg (140 lb)    Physical Exam:  BP 108/70 (BP Location: Left Arm)   Pulse 75   Temp 99 F (37.2 C) (Oral)   Resp 16   Wt 54.8 kg (120 lb 13 oz)   SpO2 97%   BMI 19.50 kg/m  Constitutional: No distress . Vital signs reviewed. HEENT: EOMI, oral membranes moist Neck: supple Cardiovascular: RRR without murmur. No JVD    Respiratory: CTA Bilaterally without wheezes or rales. Normal effort    GI: BS +, non-tender, non-distended  Musculoskeletal:No edema.  No tenderness. Neurological: Non-English-speaking [place] male.  ?aphasia + language.  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  No withdrawal to pinch in RUE or RLE Spontaneously moving LUE/LLE  Skin: warm and dry. Intact. Psychiatric:Flat  Assessment/Plan: 1. Functional deficits secondary to multifocal bilateral infarcts which require 3+ hours per day of interdisciplinary therapy in a comprehensive inpatient rehab setting. Physiatrist is providing close team supervision and 24 hour management of active medical problems listed below. Physiatrist and rehab team continue to assess barriers to discharge/monitor patient progress toward functional  and medical goals.  Function:  Bathing Bathing position   Position: Shower  Bathing parts Body parts bathed by patient: Right arm, Chest, Abdomen, Front perineal area, Right upper leg, Left upper leg, Left lower leg Body parts bathed by helper: Left arm, Buttocks, Left lower leg  Bathing assist Assist Level: Touching or steadying assistance(Pt > 75%)      Upper Body Dressing/Undressing Upper body dressing   What is the patient wearing?: Pull over shirt/dress     Pull over shirt/dress - Perfomed by patient: Thread/unthread left sleeve, Put head through opening Pull over shirt/dress - Perfomed by helper: Pull shirt over trunk, Thread/unthread right sleeve        Upper body assist Assist Level: Touching or steadying assistance(Pt > 75%)      Lower Body Dressing/Undressing Lower body dressing   What is the patient wearing?: Pants, Non-skid slipper socks   Underwear - Performed by helper: Thread/unthread right underwear leg, Thread/unthread left underwear leg, Pull underwear up/down Pants- Performed by patient: Thread/unthread left pants leg, Pull pants up/down Pants- Performed by helper: Thread/unthread right pants leg Non-skid slipper socks- Performed by patient: Don/doff left sock Non-skid slipper socks- Performed by helper: Don/doff right sock                  Lower body assist Assist for lower body dressing: Touching or steadying assistance (Pt > 75%)      Toileting Toileting Toileting activity did not occur: No  continent bowel/bladder event Toileting steps completed by patient: Adjust clothing after toileting Toileting steps completed by helper: Adjust clothing prior to toileting, Performs perineal hygiene, Adjust clothing after toileting    Toileting assist Assist level: Touching or steadying assistance (Pt.75%)   Transfers Chair/bed transfer   Chair/bed transfer method: Stand pivot Chair/bed transfer assist level: Touching or steadying assistance (Pt >  75%) Chair/bed transfer assistive device: Armrests Mechanical lift: Stedy   Locomotion Ambulation Ambulation activity did not occur: Safety/medical concerns   Max distance: 25 ft Assist level: 2 helpers   Wheelchair   Type: Manual Max wheelchair distance: 150' Assist Level: Supervision or verbal cues  Cognition Comprehension Comprehension assist level: Understands basic less than 25% of the time/ requires cueing >75% of the time  Expression Expression assist level: Expresses basis less than 25% of the time/requires cueing >75% of the time.  Social Interaction Social Interaction assist level: Interacts appropriately less than 25% of the time. May be withdrawn or combative.  Problem Solving Problem solving assist level: Solves basic less than 25% of the time - needs direction nearly all the time or does not effectively solve problems and may need a restraint for safety  Memory Memory assist level: Recognizes or recalls 25 - 49% of the time/requires cueing 50 - 75% of the time    Medical Problem List and Plan: 1.Right-sided weakness and slurred speechsecondary to multifocal acute ischemia within the left hemisphere, predominantly within the deep watershed zone and left ACA/MCA cortical watershed zone. Small subacute infarct within the right side white matter adjacent to the corpus callosum splenium on 5/26 On ASA/Plavix  Continue CIR PT, OT, SLP- team conf in am 2. DVT Prophylaxis/Anticoagulation: Subcutaneous heparin change to lovenox to reduce freq of sq inj 3. Pain Management:Tylenol as needed 4. Mood:Provide emotional support 5. Neuropsych: This patientis? capable of making decisions on hisown behalf. 6. Skin/Wound Care:Routine skin checks 7. Fluids/Electrolytes/Nutrition:Routine in and outs  BMP within acceptable range on 6/3 Continue to encourage PO, I 600 ml recorded with meals yesterday 8.ICA stenosis.Follow up IR for possibleICA revascularization.  Procedure based on timeframe of rehab services 9.Hyperlipidemia. Lipitor 10.  Hypertension  Lisinopril 2.5 mg daily Vitals:   03/07/18 2015 03/08/18 0424  BP: 122/74 108/70  Pulse: 75 75  Resp: 16 16  Temp: 98 F (36.7 C) 99 F (37.2 C)  SpO2: 97% 97%  good control 6/18  11. anemia-resolved  Hb 12.6 on 5/31  Recheck 6/3 13.5 12. Postoperative dysphagia  D1 nectar liquids-continue to push p.o.  Advance diet as tolerated 13.  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

## 2018-03-08 NOTE — Progress Notes (Signed)
Physical Therapy Session Note  Patient Details  Name: Jorge Burns MRN: [id number] Date of Birth: 12/14/1940  Today's Date: 03/08/2018 PT Individual Time: 1001-1100 PT Individual Time Calculation (min): 59 min   Short Term Goals: Week 3:  PT Short Term Goal 1 (Week 3): =LTGs due to ELOS  Skilled Therapeutic Interventions/Progress Updates:    Pt seated in w/c upon PT arrival, agreeable to therapy tx and denies pain. Pt transported to gym in w/c. Pt performed stand pivot from w/c>mat min assist. Pt performed x 20 sit<>stands with L UE support on hemi walker, min assist with manual facilitation to increase R LE weightbearing. Pt performed x 20 sit<>stands without UE support, manual facilitation through R LE for increased weightbearing, min-mod assist and tactile cues for trunk flexion. Pt worked on dynamic standing balance without UE support reaching for bean bags on his L side to promote L lateral/anterior weightshift in order to limit pushing, min assist, x 2 trials. Pt ambulated x 50 ft with hemiwalker and mod assist, therapist advancing R LE during swing, pt with improved R LE stance control. Pt ambulated x 8 ft with mod assist and hemiwalker, emphasis on pt activating R hip flexors to advance leg, however pt demonstrating compensation of vaulting to clear R foot. Attempted to worked on R hip flexion in standing, pt with difficulty following commands. Pt performed squat pivot back to w/c with min assist and transported back to room, left seated with needs in reach and QRB in place.   Therapy Documentation Precautions:  Precautions Precautions: Fall Precaution Comments: non-English speaking Restrictions Weight Bearing Restrictions: No   See Function Navigator for Current Functional Status.   Therapy/Group: Individual Therapy  Cresenciano GenreEmily van Schagen, PT, DPT 03/08/2018, 7:59 AM

## 2018-03-08 NOTE — Progress Notes (Signed)
Physical Therapy Session Note  Patient Details  Name: Elvie Maines MRN: [id number] Date of Birth: 08-25-1941  Today's Date: 03/08/2018 PT Individual Time: 1300-1325 PT Individual Time Calculation (min): 25 min   Short Term Goals: Week 3:  PT Short Term Goal 1 (Week 3): =LTGs due to ELOS  Skilled Therapeutic Interventions/Progress Updates:   Pt in w/c and agreeable to therapy, no c/o pain. Total assist w/c transport to/from therapy gym for time management. Worked on blocked practice of stand pivot transfers to/from mat in each direction w/ min-mod assist overall. Ambulated 30' x2 bouts w/ hemiwalker and mod assist overall for postural control and facilitating safe gait pattern, total assist only to assist w/ swing limb advancement 2/2 decreased hip flexion. Returned to room and ended session in w/c, call bell within reach and all needs met. Quick release belt and chair alarm engaged.   Therapy Documentation Precautions:  Precautions Precautions: Fall Precaution Comments: non-English speaking Restrictions Weight Bearing Restrictions: No Pain: Pain Assessment Pain Scale: Faces Faces Pain Scale: No hurt  See Function Navigator for Current Functional Status.   Therapy/Group: Individual Therapy  Rourke Mcquitty K Arnette 03/08/2018, 1:31 PM

## 2018-03-09 ENCOUNTER — Inpatient Hospital Stay (HOSPITAL_COMMUNITY): Payer: Medicare Other | Admitting: Occupational Therapy

## 2018-03-09 ENCOUNTER — Inpatient Hospital Stay (HOSPITAL_COMMUNITY): Payer: Medicare Other | Admitting: Speech Pathology

## 2018-03-09 ENCOUNTER — Inpatient Hospital Stay (HOSPITAL_COMMUNITY): Payer: Medicare Other

## 2018-03-09 MED ORDER — CITALOPRAM HYDROBROMIDE 10 MG PO TABS
10.0000 mg | ORAL_TABLET | Freq: Every day | ORAL | Status: DC
  Administered 2018-03-09 – 2018-03-12 (×4): 10 mg via ORAL
  Filled 2018-03-09 (×4): qty 1

## 2018-03-09 NOTE — Progress Notes (Signed)
Physical Therapy Session Note  Patient Details  Name: Jorge Burns MRN: [id number] Date of Birth: 10/27/1940  Today's Date: 03/09/2018 PT Individual Time: 1300-1425 PT Individual Time Calculation (min): 85 min   Short Term Goals: Week 3:  PT Short Term Goal 1 (Week 3): =LTGs due to ELOS  Skilled Therapeutic Interventions/Progress Updates:    Pt seated in w/c upon PT arrival, agreeable to therapy tx and denies pain. Pt transported to the gym in w/c. Pt performed stand pivot from w/c>mat with min assist. Pt transferred from sitting>supine with min assist and verbal/tactile cues. Pt transferred to L sidelying with min assist. In sidelying position pt worked on gravity eliminated exercises with R LE using powder board including the following: active assisted hip flexion, hip extension and knee extension- pt activating muscles but unable to move through entire motion without assist. Pt transferred to supine with min assist, in supine pt performed 2 x 10 bridges for hip extensor strengthening and NMR. Pt transferred from supine>sitting with min assist. Pt performed sit<>stand with hemi walker and min assist, in standing worked on activation of R hip flexors to initiate step however pt unable. Pt ambulated with hemiwalker and mod assist however unable to initiate R step without assist. Pt worked on propelled w/c with L hemi technique 2 x 150 ft, min assist to supervision for obstacle avoidance, max verbal cues for attention to R side. Pt performed car transfer with min assist to the L and mod assist to the R, stand pivot. Pt performed sit<>stand with min assist and hemiwalker, worked on dynamic standing balance while reaching for horseshoes on L side, x 2 trials. Pt transported back to room at end of session, transferred back to bed min assist stand pivot. Left supine with needs in reach and bed alarm set.   Therapy Documentation Precautions:  Precautions Precautions: Fall Precaution Comments: non-English  speaking Restrictions Weight Bearing Restrictions: No   See Function Navigator for Current Functional Status.   Therapy/Group: Individual Therapy  Cresenciano GenreEmily van Schagen, PT, DPT 03/09/2018, 7:48 AM

## 2018-03-09 NOTE — Progress Notes (Signed)
Social Work   Jaylia Pettus, Elveria Rising  Social Worker  Physical Medicine and Rehabilitation  Patient Care Conference  Signed  Date of Service:  03/09/2018  3:43 PM          Signed          Show:Clear all [x] Manual[x] Template[] Copied  Added by: [x] Shakelia Scrivner, Lemar Livings, LCSW   [] Hover for details   Inpatient RehabilitationTeam Conference and Plan of Care Update Date: 03/09/2018   Time: 11:00      Patient Name: Jorge Burns      Medical Record Number: [id number]  Date of Birth: [birth year] Sex: Male         Room/Bed: 4W19C/4W19C-01 Payor Info: Payor: MEDICARE / Plan: MEDICARE PART A AND B / Product Type: *No Product type* /     Admitting Diagnosis: L CVA  Admit Date/Time:  02/18/2018  5:55 PM Admission Comments: No comment available    Primary Diagnosis:  <principal problem not specified> Principal Problem: <principal problem not specified>       Patient Active Problem List    Diagnosis Date Noted  . Dysphagia, post-stroke    . Essential hypertension    . 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 Right hemiparesis (HCC)    . Pulmonary fungal infection    . Diastolic dysfunction    . Prediabetes    . CVA (cerebral vascular accident) (HCC) 02/13/2018  . Language barrier    . Benign essential HTN        Expected Discharge Date: Expected Discharge Date: 03/12/18   Team Members Present: Physician leading conference: Dr. Claudette Laws Social Worker Present: Dossie Der, LCSW Nurse Present: Kennyth Arnold, RN PT Present: Woodfin Ganja, PT OT Present: Kearney Hard, OT SLP Present: Feliberto Gottron, SLP PPS Coordinator present : Tora Duck, RN, CRRN       Current Status/Progress Goal Weekly Team Focus  Medical     Aphasia, some receptive language ability with help of interpreter, afect is brightter  Improve mood adn communication  medication mangement for mood   Bowel/Bladder     incontinent of b/b, LBM 6/15, senna given prn on 6/18  maintain b/b  with mod A  monitor q shift and prn, timed toileting q 2-3 h w/a   Swallow/Nutrition/ Hydration     Dys. 1 textures with nectar-thick liquids, Mod A  Min A  MBS tomorrow    ADL's     Min/mod A BADLs with LOTS of cues, Still needs mod/max A for toilet transfers and mod A for standing balance with clothing management, can be min A for transfers to wc  Min A overall  R attention, apraxia, R NMR, pt/family education, modified bathing/dressing, dc planning   Mobility     min assist bed mobility, sit<>stand and transfers, mod-max assist gait with hemi walker x 50 ft  min assist  R NMR, gait, transfers, carry over, d/c planning, family education    Communication     Total A   Mod A  multimodal communciation    Safety/Cognition/ Behavioral Observations   Min A for attention  Min A attention  attention, problem solving    Pain     no s/s of pain  maintain pain @<2  monitor q shift and prn for s/s of pain, nonverbal cues   Skin     ecchymosis, no breakdown noted  maintain skin with mod A  assess q shift and prn     *See Care Plan and progress notes for  long and short-term goals.      Barriers to Discharge   Current Status/Progress Possible Resolutions Date Resolved   Physician     Medical stability;Medication compliance     Slow progress  encourgae fluids      Nursing                 PT  Decreased caregiver support;Home environment access/layout  communication deficits, lack of family support to provide 24/7 care              OT                 SLP            SW              Discharge Planning/Teaching Needs:  Family palns to take him home and provide assistance, will need family education prior to DC-Thursday.       Team Discussion:  Pt making slow progress and doing better with gestures and communication. Still right inattention and apraxia. MD transitioning to celexa and discharging ritalin prior to DC home. Family education scheduled for tomorrow with family and discharge moved  to Sat due to having more help at home with him. MBS tomorrow to see if can upgrade diet. Timed toileting for continence.  Revisions to Treatment Plan:  DC 6/22    Continued Need for Acute Rehabilitation Level of Care: The patient requires daily medical management by a physician with specialized training in physical medicine and rehabilitation for the following conditions: Daily direction of a multidisciplinary physical rehabilitation program to ensure safe treatment while eliciting the highest outcome that is of practical value to the patient.: Yes Daily medical management of patient stability for increased activity during participation in an intensive rehabilitation regime.: Yes Daily analysis of laboratory values and/or radiology reports with any subsequent need for medication adjustment of medical intervention for : Neurological problems;Mood/behavior problems;Other   Lucy Chris 03/09/2018, 3:43 PM                  Ravonda Brecheen, Lemar Livings, LCSW  Social Worker  Physical Medicine and Rehabilitation  Patient Care Conference  Signed  Date of Service:  02/23/2018  3:56 PM          Signed          Show:Clear all [x] Manual[x] Template[] Copied  Added by: [x] Herberta Pickron, Lemar Livings, LCSW   [] Hover for details   Inpatient RehabilitationTeam Conference and Plan of Care Update Date: 02/23/2018   Time: 11:15 AM      Patient Name: Jorge Burns      Medical Record Number: [id number]  Date of Birth: 1941/07/19 Sex: Male         Room/Bed: 4W19C/4W19C-01 Payor Info: Payor: MEDICARE / Plan: MEDICARE PART A AND B / Product Type: *No Product type* /     Admitting Diagnosis: L CVA  Admit Date/Time:  02/18/2018  5:55 PM Admission Comments: No comment available    Primary Diagnosis:  <principal problem not specified> Principal Problem: <principal problem not specified>       Patient Active Problem List    Diagnosis Date Noted  . Dysphagia, post-stroke    . Acute blood loss anemia    .  Essential hypertension    . 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 Aphasic disturbance    . Right hemiparesis (HCC)    . Pulmonary fungal infection    . Diastolic dysfunction    . Prediabetes    .  CVA (cerebral vascular accident) (HCC) 02/13/2018  . Influenza B 11/29/2015  . History of CVA (cerebrovascular accident)    . Language barrier    . Benign essential HTN    . Syncope 11/28/2015      Expected Discharge Date: Expected Discharge Date: 03/11/18   Team Members Present: Physician leading conference: Dr. Claudette LawsAndrew Kirsteins Social Worker Present: Dossie DerBecky Takerra Lupinacci, LCSW Nurse Present: Other (comment)(Crystal bennett-RN) PT Present: Woodfin GanjaEmily Van Shagen, PT OT Present: Kearney HardElisabeth Doe, OT SLP Present: Feliberto Gottronourtney Payne, SLP PPS Coordinator present : Tora DuckMarie Noel, RN, CRRN       Current Status/Progress Goal Weekly Team Focus  Medical     A phasic, blood pressure is well controlled, oral intake variable with fluids, labs stable  Obtain medical stability during rehabilitation stay, transition to outpatient care  Improve activity tolerance, work on communication skills   Bowel/Bladder     pt is incontinent of b/b  LBM 06/01 had only smear on 06/03  pt will be continent of b/b with mod assist normal bowel pattern   monitor b/b toileting protocol laxatives prn   Swallow/Nutrition/ Hydration     Dys. 1 textures with nectar-thick liquids, Mod A  Min A  trials of upgraded textures/liquids   ADL's     Max/total A  Min A overall  modified bathing/dressing, R attention, pusher syndrome, transfers, R NMR   Mobility     max assist overall, have not initiated gait yet  min assist  R attention, all functional mobility, command following w/ functional mobility   Communication     Total A  Min A for multimodal communication  following 1 step commands, vocalizations on command    Safety/Cognition/ Behavioral Observations   Mod A   Min A  sustained attention    Pain     pt appears to be  comfortable no s/sx of pain  pt will have pain that <2/10  assess pain qshift and prn   Skin     ecchymosis on abdomen and bilateral arms  pt will be free of breakdown skin intact  assess skin qshift and prn     *See Care Plan and progress notes for long and short-term goals.      Barriers to Discharge   Current Status/Progress Possible Resolutions Date Resolved   Physician     Medical stability;Medication compliance     Participating in therapy program, communication and issue  Continue rehab, interpreter services would be helpful      Nursing                 PT  Other (comments)(Pt nonverbal at this time)                 OT                 SLP Decreased caregiver support;Lack of/limited family support            SW Decreased caregiver support granddaughter unsure if can do any physical care             Discharge Planning/Teaching Needs:  Grandaughter reports she can not provide physical care due to caring for her own children. She wants to see how he does here due to wanting him to come home with her      Team Discussion:  Goals min assist level, has right inattention and expressive issues. MD to trial ritalin, may be depressed due to aware of his deficits. Dys 1 nectar thick trials of  upgraded textures. Will ask neuro-psych to see-due to depression. Granddaughter concerned may be too much care for her.  Revisions to Treatment Plan:  DC 6/21    Continued Need for Acute Rehabilitation Level of Care: The patient requires daily medical management by a physician with specialized training in physical medicine and rehabilitation for the following conditions: Daily direction of a multidisciplinary physical rehabilitation program to ensure safe treatment while eliciting the highest outcome that is of practical value to the patient.: Yes Daily medical management of patient stability for increased activity during participation in an intensive rehabilitation regime.: Yes Daily analysis of  laboratory values and/or radiology reports with any subsequent need for medication adjustment of medical intervention for : Neurological problems;Other   Lucy Chris 02/23/2018, 3:56 PM                 Patient ID: Alfonse Ras, male   DOB: 1940/11/02, 77 y.o.   MRN: [id number]

## 2018-03-09 NOTE — Progress Notes (Signed)
[facility] PHYSICAL MEDICINE & REHABILITATION     PROGRESS NOTE  Subjective/Complaints:   Pt able to follow commands with aid of [place] interpreter, non verbal per interpreter including no Yes/No response  ROS: Limited due to aphasia   Objective: Vital Signs: Blood pressure 120/71, pulse 75, temperature 98 F (36.7 C), resp. rate 18, height 5' 6"  (1.676 m), weight 55 kg (121 lb 4.1 oz), SpO2 96 %. No results found. Recent Labs    03/08/18 0621  WBC 7.2  HGB 12.6*  HCT 39.2  PLT 296   Recent Labs    03/08/18 0621  NA 140  K 4.2  CL 106  GLUCOSE 113*  BUN 27*  CREATININE 1.13  CALCIUM 9.5   CBG (last 3)  No results for input(s): GLUCAP in the last 72 hours.  Wt Readings from Last 3 Encounters:  03/09/18 55 kg (121 lb 4.1 oz)  02/13/18 57.2 kg (126 lb 1.7 oz)  02/11/18 63.5 kg (140 lb)    Physical Exam:  BP 120/71 (BP Location: Left Arm)   Pulse 75   Temp 98 F (36.7 C)   Resp 18   Ht 5' 6"  (1.676 m)   Wt 55 kg (121 lb 4.1 oz)   SpO2 96%   BMI 19.57 kg/m  Constitutional: No distress . Vital signs reviewed. HEENT: EOMI, oral membranes moist Neck: supple Cardiovascular: RRR without murmur. No JVD    Respiratory: CTA Bilaterally without wheezes or rales. Normal effort    GI: BS +, non-tender, non-distended  Musculoskeletal:No edema.  No tenderness. Neurological: Non-English-speaking [place] male.  ?aphasia + language.  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  No withdrawal to pinch in RUE or RLE Spontaneously moving LUE/LLE  Skin: warm and dry. Intact. Psychiatric:Flat  Assessment/Plan: 1. Functional deficits secondary to multifocal bilateral infarcts which require 3+ hours per day of interdisciplinary therapy in a comprehensive inpatient rehab setting. Physiatrist is providing close team supervision and 24 hour management of active medical problems listed below. Physiatrist and rehab team continue to assess  barriers to discharge/monitor patient progress toward functional and medical goals.  Function:  Bathing Bathing position   Position: Shower  Bathing parts Body parts bathed by patient: Right arm, Chest, Abdomen, Front perineal area, Right upper leg, Left upper leg, Left lower leg, Right lower leg Body parts bathed by helper: Back, Buttocks, Left arm  Bathing assist Assist Level: Touching or steadying assistance(Pt > 75%)      Upper Body Dressing/Undressing Upper body dressing   What is the patient wearing?: Pull over shirt/dress     Pull over shirt/dress - Perfomed by patient: Thread/unthread left sleeve, Put head through opening, Pull shirt over trunk Pull over shirt/dress - Perfomed by helper: Thread/unthread right sleeve        Upper body assist Assist Level: Touching or steadying assistance(Pt > 75%)      Lower Body Dressing/Undressing Lower body dressing   What is the patient wearing?: Pants, Non-skid slipper socks, Ted Hose   Underwear - Performed by helper: Thread/unthread right underwear leg, Thread/unthread left underwear leg, Pull underwear up/down Pants- Performed by patient: Thread/unthread left pants leg, Pull pants up/down Pants- Performed by helper: Thread/unthread right pants leg Non-skid slipper socks- Performed by patient: Don/doff left sock Non-skid slipper socks- Performed by helper: Don/doff right sock, Don/doff left sock               TED Hose - Performed by helper: Don/doff right TED hose  Lower body assist Assist for lower body dressing: Touching or steadying assistance (Pt > 75%)      Toileting Toileting Toileting activity did not occur: No continent bowel/bladder event Toileting steps completed by patient: Adjust clothing after toileting Toileting steps completed by helper: Performs perineal hygiene    Toileting assist Assist level: Touching or steadying assistance (Pt.75%)   Transfers Chair/bed transfer   Chair/bed transfer method:  Stand pivot Chair/bed transfer assist level: Touching or steadying assistance (Pt > 75%) Chair/bed transfer assistive device: Armrests Mechanical lift: Stedy   Locomotion Ambulation Ambulation activity did not occur: Safety/medical concerns   Max distance: 50 ft Assist level: Moderate assist (Pt 50 - 74%)   Wheelchair   Type: Manual Max wheelchair distance: 150' Assist Level: Supervision or verbal cues  Cognition Comprehension Comprehension assist level: Understands basic less than 25% of the time/ requires cueing >75% of the time  Expression Expression assist level: Expresses basis less than 25% of the time/requires cueing >75% of the time.  Social Interaction Social Interaction assist level: Interacts appropriately less than 25% of the time. May be withdrawn or combative.  Problem Solving Problem solving assist level: Solves basic less than 25% of the time - needs direction nearly all the time or does not effectively solve problems and may need a restraint for safety  Memory Memory assist level: Recognizes or recalls 25 - 49% of the time/requires cueing 50 - 75% of the time    Medical Problem List and Plan: 1.Right-sided weakness and slurred speechsecondary to multifocal acute ischemia within the left hemisphere, predominantly within the deep watershed zone and left ACA/MCA cortical watershed zone. Small subacute infarct within the right side white matter adjacent to the corpus callosum splenium on 5/26 On ASA/Plavix  Continue CIR PT, OT, SLP- Team conference today please see physician documentation under team conference tab, met with team face-to-face to discuss problems,progress, and goals. Formulized individual treatment plan based on medical history, underlying problem and comorbidities. 2. DVT Prophylaxis/Anticoagulation: Subcutaneous heparin change to lovenox to reduce freq of sq inj 3. Pain Management:Tylenol as needed 4. Mood:Provide emotional support 5. Neuropsych:  This patientis? capable of making decisions on hisown behalf. 6. Skin/Wound Care:Routine skin checks 7. Fluids/Electrolytes/Nutrition:Routine in and outs  BMP within acceptable range on 6/3 Continue to encourage PO, I 600 ml recorded with meals yesterday 8.ICA stenosis.Follow up IR for possibleICA revascularization. Procedure based on timeframe of rehab services 9.Hyperlipidemia. Lipitor 10.  Hypertension  Lisinopril 2.5 mg daily Vitals:   03/08/18 1940 03/09/18 0428  BP: 118/65 120/71  Pulse: 84 75  Resp: 18 18  Temp: 98 F (36.7 C) 98 F (36.7 C)  SpO2: 96% 96%  good control 6/19  11. anemia-resolved  Hb 12.6 on 5/31  Recheck 6/3 13.5 12. Postoperative dysphagia  D1 nectar liquids-continue to push p.o.  Advance diet as tolerated 13.  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

## 2018-03-09 NOTE — Patient Care Conference (Signed)
Inpatient RehabilitationTeam Conference and Plan of Care Update Date: 03/09/2018   Time: 11:00    Patient Name: Jorge Burns      Medical Record Number: [id number]  Date of Birth: 07/26/1941 Sex: Male         Room/Bed: 4W19C/4W19C-01 Payor Info: Payor: MEDICARE / Plan: MEDICARE PART A AND B / Product Type: *No Product type* /    Admitting Diagnosis: L CVA  Admit Date/Time:  02/18/2018  5:55 PM Admission Comments: No comment available   Primary Diagnosis:  <principal problem not specified> Principal Problem: <principal problem not specified>  Patient Active Problem List   Diagnosis Date Noted  . Dysphagia, post-stroke   . Essential hypertension   . Acute bilat watershed infarction [facility](HCC) 02/18/2018  . Right hemiparesis (HCC)   . Pulmonary fungal infection   . Diastolic dysfunction   . Prediabetes   . CVA (cerebral vascular accident) (HCC) 02/13/2018  . Language barrier   . Benign essential HTN     Expected Discharge Date: Expected Discharge Date: 03/12/18  Team Members Present: Physician leading conference: Dr. Claudette LawsAndrew Kirsteins Social Worker Present: Dossie DerBecky Oney Folz, LCSW Nurse Present: Kennyth ArnoldStacey Jennings, RN PT Present: Woodfin GanjaEmily Van Shagen, PT OT Present: Kearney HardElisabeth Doe, OT SLP Present: Feliberto Gottronourtney Payne, SLP PPS Coordinator present : Tora DuckMarie Noel, RN, CRRN     Current Status/Progress Goal Weekly Team Focus  Medical   Aphasia, some receptive language ability with help of interpreter, afect is brightter  Improve mood adn communication  medication mangement for mood   Bowel/Bladder   incontinent of b/b, LBM 6/15, senna given prn on 6/18  maintain b/b with mod A  monitor q shift and prn, timed toileting q 2-3 h w/a   Swallow/Nutrition/ Hydration   Dys. 1 textures with nectar-thick liquids, Mod A  Min A  MBS tomorrow    ADL's   Min/mod A BADLs with LOTS of cues, Still needs mod/max A for toilet transfers and mod A for standing balance with clothing management, can be min A for transfers to  wc  Min A overall  R attention, apraxia, R NMR, pt/family education, modified bathing/dressing, dc planning   Mobility   min assist bed mobility, sit<>stand and transfers, mod-max assist gait with hemi walker x 50 ft  min assist  R NMR, gait, transfers, carry over, d/c planning, family education    Communication   Total A   Mod A  multimodal communciation    Safety/Cognition/ Behavioral Observations  Min A for attention  Min A attention  attention, problem solving    Pain   no s/s of pain  maintain pain @<2  monitor q shift and prn for s/s of pain, nonverbal cues   Skin   ecchymosis, no breakdown noted  maintain skin with mod A  assess q shift and prn      *See Care Plan and progress notes for long and short-term goals.     Barriers to Discharge  Current Status/Progress Possible Resolutions Date Resolved   Physician    Medical stability;Medication compliance     Slow progress  encourgae fluids      Nursing                  PT  Decreased caregiver support;Home environment access/layout  communication deficits, lack of family support to provide 24/7 care              OT  SLP                SW                Discharge Planning/Teaching Needs:  Family palns to take him home and provide assistance, will need family education prior to DC-Thursday.       Team Discussion:  Pt making slow progress and doing better with gestures and communication. Still right inattention and apraxia. MD transitioning to celexa and discharging ritalin prior to DC home. Family education scheduled for tomorrow with family and discharge moved to Sat due to having more help at home with him. MBS tomorrow to see if can upgrade diet. Timed toileting for continence.  Revisions to Treatment Plan:  DC 6/22    Continued Need for Acute Rehabilitation Level of Care: The patient requires daily medical management by a physician with specialized training in physical medicine and rehabilitation for  the following conditions: Daily direction of a multidisciplinary physical rehabilitation program to ensure safe treatment while eliciting the highest outcome that is of practical value to the patient.: Yes Daily medical management of patient stability for increased activity during participation in an intensive rehabilitation regime.: Yes Daily analysis of laboratory values and/or radiology reports with any subsequent need for medication adjustment of medical intervention for : Neurological problems;Mood/behavior problems;Other  Lucy Chris 03/09/2018, 3:43 PM

## 2018-03-09 NOTE — Progress Notes (Signed)
Speech Language Pathology Daily Session Note  Patient Details  Name: Jorge Burns MRN: [id number] Date of Birth: 01/07/1941  Today's Date: 03/09/2018 SLP Individual Time: 1015-1100 SLP Individual Time Calculation (min): 45 min  Short Term Goals: Week 3: SLP Short Term Goal 1 (Week 3): Pt will convey wants/needs with use of multimodal communication ( gestures, facial expressions) with Max A multimodal cues.  SLP Short Term Goal 2 (Week 3): Patient will vocalize at the CV level in 50% of opportunities with Max A multimodal cues.  SLP Short Term Goal 3 (Week 3): Patient will identify functional items from a field of 2 with 75% accuracy and Max A multimodal cues.  SLP Short Term Goal 4 (Week 3): Pt will follow 1 step functional directions related to self with 75% accuracy and Min A multimodal cues SLP Short Term Goal 5 (Week 3): Pt will consume trials of ice chips/thin liquids via TSP  with minimal overt s/s aspiration and Mod A verbal cues over 2 sessions to demonstrate readiness for repeat MBS.  SLP Short Term Goal 6 (Week 3): Pt will demonstrate efficient mastication and complete oral clearance of dys 2 texture trials without overt s/s aspiration and Min A verbal cues over 2 sessions prior to upgrade.   Skilled Therapeutic Interventions: Skilled treatment session focused on dysphagia and speech goals. Patient consumed small, controlled sips of thin liquids via cup without overt s/s of aspiration and minimal anterior spillage. Recommend repeat MBS tomorrow to assess dysphagia due to h/o silent aspiration. SLP also facilitated session by providing Max-Total A verbal and visual cues for patient to identify functional objects from a field of 2 with 25% accuracy. Patient always chose the object that was in his right visual field despite Max A multimodal cues. Patient left upright in wheelchair with all needs within reach and alarm on. Continue with current plan of care.       Function:    Cognition Comprehension Comprehension assist level: Understands basic less than 25% of the time/ requires cueing >75% of the time  Expression   Expression assist level: Expresses basis less than 25% of the time/requires cueing >75% of the time.  Social Interaction Social Interaction assist level: Interacts appropriately less than 25% of the time. May be withdrawn or combative.  Problem Solving Problem solving assist level: Solves basic less than 25% of the time - needs direction nearly all the time or does not effectively solve problems and may need a restraint for safety  Memory Memory assist level: Recognizes or recalls 25 - 49% of the time/requires cueing 50 - 75% of the time    Pain No/Denies Pain   Therapy/Group: Individual Therapy  Rehaan Viloria 03/09/2018, 3:34 PM

## 2018-03-09 NOTE — Progress Notes (Signed)
Social Work Patient ID: Engineer, manufacturing systemsHuoi Burns, male   DOB: 01/01/1941, 77 y.o.   MRN: [id number]  Spoke with [name]-granddaughter to inform team conference progress and family education time tomorrow 2:30-3:30 & 3:30-4:30, she will be here for the first one and her husband will be with her for the second one. Have moved discharge to Sat per her request due to having help with him on Sat. Will order equipment and follow up therapies.

## 2018-03-09 NOTE — Progress Notes (Signed)
Occupational Therapy Session Note  Patient Details  Name: Jorge Burns MRN: [id number] Date of Birth: 1941-02-10  Today's Date: 03/09/2018 OT Individual Time: 0900-1000 OT Individual Time Calculation (min): 60 min   Short Term Goals: Week 3:  OT Short Term Goal 1 (Week 3): Pt will complete toilet transfer with mod A OT Short Term Goal 2 (Week 3): Pt will maintain balance with mod A in prep for clothing managent when toileting OT Short Term Goal 3 (Week 3): Pt will use grooming objects appropriately with no more than min verbal cues  Skilled Therapeutic Interventions/Progress Updates:    Pt greeted semi-reclined in bed with interpreter present and agreeable to OT. Pt came to sitting EOB with min A. Utilized hemi walker to transfer to the R with mod A. Pt brought into the bathroom and competed toilet transfer to the R using grab bars with mod A and facilitation for head/hips relationship. Pt had to sit on commode, then OT had pt stand again to remove brief. Pt pushing self over using R hand and needed max A to come to standing using hemi walker to maintain forward position and not push self over. Pt with continent BM with max A for 3/3 toileting steps. Mod A to lift off of commode, then mod A to facilitate pivot at hips. LB dressing completed wc at the sink with OT placing R LE into figure 4 position, then pt able to thread pant leg with mod instructional cues and touching assist. Improved standing balance with decreased pushing when standing to pull up hips. Pt brought into gravity eliminated sidelying position on therapy mat. Utilized table and hand skate to facilitate R UE NMR. Pt with trace shoulder activation noted in gravity eliminated. Pt returned to room and left seated in wc with safety belt on and needs met.   Therapy Documentation Precautions:  Precautions Precautions: Fall Precaution Comments: non-English speaking Restrictions Weight Bearing Restrictions: No Pain:   none/denies  pain Other Treatments:    See Function Navigator for Current Functional Status.   Therapy/Group: Individual Therapy  Valma Cava 03/09/2018, 9:41 AM

## 2018-03-10 ENCOUNTER — Inpatient Hospital Stay (HOSPITAL_COMMUNITY): Payer: Medicare Other | Admitting: Occupational Therapy

## 2018-03-10 ENCOUNTER — Ambulatory Visit (HOSPITAL_COMMUNITY): Payer: Medicare Other | Admitting: Physical Therapy

## 2018-03-10 ENCOUNTER — Inpatient Hospital Stay (HOSPITAL_COMMUNITY): Payer: Medicare Other | Admitting: Physical Therapy

## 2018-03-10 ENCOUNTER — Inpatient Hospital Stay (HOSPITAL_COMMUNITY): Payer: Medicare Other

## 2018-03-10 ENCOUNTER — Inpatient Hospital Stay (HOSPITAL_COMMUNITY): Payer: Medicare Other | Admitting: Speech Pathology

## 2018-03-10 NOTE — Plan of Care (Signed)
  Problem: RH COGNITION-NURSING Goal: RH STG USES MEMORY AIDS/STRATEGIES W/ASSIST TO PROBLEM SOLVE Description STG Uses Memory Aids/Strategies With  Mod Assistance to Problem Solve.  Outcome: Not Progressing  Pt is max assist  Problem: RH COGNITION-NURSING Goal: RH STG ANTICIPATES NEEDS/CALLS FOR ASSIST W/ASSIST/CUES Description STG Anticipates Needs/Calls for Assist With Assistance/Cues. Outcome: Not Progressing  Pt does not use call bell even after explaining to him when to use Problem: RH KNOWLEDGE DEFICIT Goal: RH STG INCREASE KNOWLEDGE OF DYSPHAGIA/FLUID INTAKE Description Patient Meryle Ready/family will be able understand and increase knowledge of dysphagia/fluid intake using cues/resources independently   Outcome: Not Progressing  Diet changed today and pt did not eat lunch, very poor po intake at this point sn was upgraded

## 2018-03-10 NOTE — Progress Notes (Addendum)
Physical Therapy Session Note  Patient Details  Name: Tripton Ned MRN: [id number] Date of Birth: [birth year]  Today's Date: 03/10/2018 PT Individual Time: 1025-1105 AND 1525-1620 PT Individual Time Calculation (min): 40 min AND 55 min  Short Term Goals: Week 3:  PT Short Term Goal 1 (Week 3): =LTGs due to ELOS  Skilled Therapeutic Interventions/Progress Updates:   Session 1:  Pt in supine and agreeable to therapy, no evidence of pain during session. Session focused on blocked practice of stand pivot transfers and sit<>supine. Performed 10+ stand pivot transfers to each side, close supervision to min assist overall w/ occasional manual facilitation of increased anterior weight shifting w/ standing when going to hemi (R) side. Overall much improved from previous sessions with this therapist. Additionally performed ~5 sit<>supine transfers w/ min assist overall and verbal, visual, and tactile cues for RLE management and to increase independence w/ him lifting the leg using his LLE. Returned to room and ended session in w/c, call bell within reach and all needs met.   Session 2:  Pt in w/c and agreeable to therapy, no evidence or c/o pain during session. Granddaughter and her husband present for family education. Instructed and performed w/c bumping both ascending and descending step. Husband returned demonstration safely and granddaughter providing appropriate assistance as well. Instructed and performed car transfer x2 reps at min assist level. Educated both on difference between transfers towards R (hemi) side vs. L side and both verbalized and demonstrated understanding. Additionally performed practice stand pivot transfer to/from bed w/o bed rails as he does not have them at home. Discussed providing supervision during the night at first to see how pt responds to being at home, in his own bed, and w/o bed alarm. Cautioned w/ gait at home, to only perform w/ therapist as he requires skilled assistance  at this time. Granddaughter verbalized understanding and in agreement w/ all precautions. Granddaughter w/ good awareness of pt's deficits, both cognitive and physical, she plans to be present at speech therapy tomorrow. Ended session in supine, in care of family and all needs met.   Therapy Documentation Precautions:  Precautions Precautions: Fall Precaution Comments: non-English speaking Restrictions Weight Bearing Restrictions: No Pain: Pain Assessment Pain Score: 0-No pain Faces Pain Scale: No hurt  See Function Navigator for Current Functional Status.   Therapy/Group: Individual Therapy  Jerral Mccauley K Arnette 03/10/2018, 11:49 AM

## 2018-03-10 NOTE — Progress Notes (Signed)
[place] PHYSICAL MEDICINE & REHABILITATION     PROGRESS NOTE  Subjective/Complaints:  Appreciate MBS, upgraded to D2 thins  ROS: Limited due to aphasia   Objective: Vital Signs: Blood pressure 115/75, pulse 86, temperature 97.6 F (36.4 C), temperature source Oral, resp. rate 18, height 5\' 6"  (1.676 m), weight 55 kg (121 lb 4.1 oz), SpO2 98 %. Dg Swallowing Func-speech Pathology  Result Date: 03/10/2018 Objective Swallowing Evaluation: Type of Study: MBS-Modified Barium Swallow Study  Patient Details Name: Jorge Burns MRN: [id number] Date of Birth: [birth year] Today's Date: 03/10/2018 Time: SLP Start Time (ACUTE ONLY): 0930 -SLP Stop Time (ACUTE ONLY): 1000 SLP Time Calculation (min) (ACUTE ONLY): 30 min Past Medical History: Past Medical History: Diagnosis Date . Hypertension  . Stroke [facility])   "a long time ago -- 2 years ago" Past Surgical History: Past Surgical History: Procedure Laterality Date . IR ANGIO INTRA EXTRACRAN SEL COM CAROTID INNOMINATE BILAT MOD SED  02/15/2018 . IR ANGIO VERTEBRAL SEL SUBCLAVIAN INNOMINATE UNI R MOD SED  02/15/2018 . IR ANGIO VERTEBRAL SEL VERTEBRAL UNI L MOD SED  02/15/2018 HPI: Jorge Burns is a 77 y.o. male with history of hypertension and a previous stroke presenting with right sided weakness and AMS. He did not receive IV t-PA due to late presentation. MRI showed multifocal acute ischemia within the left hemisphere due to severe left carotid stenosis. Pt initially passed stroke swallow screen, referred for swallow evaluation due to MD, RN concerns; MD downgraded to D1/nectar.  Repeat MRI 5/29 Expected evolution of the Left MCA and MCA/ACA watershed territory infarcts seen on 02/13/2018 with no associated hemorrhage or mass effect.  Subjective: Alert; interpretor present for study Assessment / Plan / Recommendation CHL IP CLINICAL IMPRESSIONS 03/10/2018 Clinical Impression Patient' swallowing function has improved since initial MBS as well as his arousal, attention and  ability to follow commands. Patient demonstrates a mild oral dysphagia characterized by mildly prolonged mastication and right anterior spillage due to oral weakness. Patient independently utilized a chin down posture throughout study which appeared to improve his oral containment and overall airway protection. However, towards end of study, patient demonstrated 1 episode of premature spillage to the pyriform sinuses resulting in trace penetration with large bolus when his head was hyperextended. Patient's pharyngeal phase is characterized by a delayed swallow initiation to the valleculae with all consistencies with strength appearing WFL without residuals.  Recommend patient upgrade to Dys. 2 textures with thin liquids via cup with strict aspiration precautions. Discussed results with patient with interpreter, he verbalized understanding and agreement. SLP Visit Diagnosis Dysphagia, oropharyngeal phase (R13.12) Attention and concentration deficit following -- Frontal lobe and executive function deficit following -- Impact on safety and function Moderate aspiration risk   CHL IP TREATMENT RECOMMENDATION 03/10/2018 Treatment Recommendations Therapy as outlined in treatment plan below   Prognosis 03/10/2018 Prognosis for Safe Diet Advancement Fair Barriers to Reach Goals Language deficits;Cognitive deficits;Severity of deficits Barriers/Prognosis Comment -- CHL IP DIET RECOMMENDATION 03/10/2018 SLP Diet Recommendations Dysphagia 2 (Fine chop) solids;Thin liquid Liquid Administration via Cup;No straw Medication Administration Crushed with puree Compensations Slow rate;Small sips/bites;Minimize environmental distractions;Monitor for anterior loss Postural Changes Seated upright at 90 degrees   CHL IP OTHER RECOMMENDATIONS 03/10/2018 Recommended Consults -- Oral Care Recommendations Oral care BID Other Recommendations --   CHL IP FOLLOW UP RECOMMENDATIONS 03/10/2018 Follow up Recommendations Home health SLP   CHL IP FREQUENCY  AND DURATION 03/10/2018 Speech Therapy Frequency (ACUTE ONLY) min 5x/week Treatment Duration 1 week  CHL IP ORAL PHASE 03/10/2018 Oral Phase Impaired Oral - Pudding Teaspoon -- Oral - Pudding Cup -- Oral - Honey Teaspoon -- Oral - Honey Cup -- Oral - Nectar Teaspoon -- Oral - Nectar Cup Delayed oral transit Oral - Nectar Straw -- Oral - Thin Teaspoon Delayed oral transit Oral - Thin Cup Right anterior bolus loss;Delayed oral transit;Premature spillage Oral - Thin Straw -- Oral - Puree Delayed oral transit Oral - Mech Soft Impaired mastication;Delayed oral transit Oral - Regular -- Oral - Multi-Consistency -- Oral - Pill -- Oral Phase - Comment --  CHL IP PHARYNGEAL PHASE 03/10/2018 Pharyngeal Phase Impaired Pharyngeal- Pudding Teaspoon -- Pharyngeal -- Pharyngeal- Pudding Cup -- Pharyngeal -- Pharyngeal- Honey Teaspoon -- Pharyngeal -- Pharyngeal- Honey Cup -- Pharyngeal -- Pharyngeal- Nectar Teaspoon -- Pharyngeal -- Pharyngeal- Nectar Cup Delayed swallow initiation-vallecula Pharyngeal -- Pharyngeal- Nectar Straw -- Pharyngeal -- Pharyngeal- Thin Teaspoon Delayed swallow initiation-vallecula Pharyngeal -- Pharyngeal- Thin Cup Delayed swallow initiation-vallecula;Penetration/Aspiration before swallow Pharyngeal Material enters airway, remains ABOVE vocal cords and not ejected out Pharyngeal- Thin Straw -- Pharyngeal -- Pharyngeal- Puree Delayed swallow initiation-vallecula Pharyngeal -- Pharyngeal- Mechanical Soft Delayed swallow initiation-vallecula Pharyngeal -- Pharyngeal- Regular -- Pharyngeal -- Pharyngeal- Multi-consistency -- Pharyngeal -- Pharyngeal- Pill -- Pharyngeal -- Pharyngeal Comment --  No flowsheet data found. No flowsheet data found. PAYNE, COURTNEY 03/10/2018, 12:41 PM  Feliberto Gottron, MA, CCC-SLP [phone]             Recent Labs    03/08/18 0621  WBC 7.2  HGB 12.6*  HCT 39.2  PLT 296   Recent Labs    03/08/18 0621  NA 140  K 4.2  CL 106  GLUCOSE 113*  BUN 27*  CREATININE 1.13   CALCIUM 9.5   CBG (last 3)  No results for input(s): GLUCAP in the last 72 hours.  Wt Readings from Last 3 Encounters:  03/09/18 55 kg (121 lb 4.1 oz)  02/13/18 57.2 kg (126 lb 1.7 oz)  02/11/18 63.5 kg (140 lb)    Physical Exam:  BP 115/75 (BP Location: Left Arm)   Pulse 86   Temp 97.6 F (36.4 C) (Oral)   Resp 18   Ht 5\' 6"  (1.676 m)   Wt 55 kg (121 lb 4.1 oz)   SpO2 98%   BMI 19.57 kg/m  Constitutional: No distress . Vital signs reviewed. HEENT: EOMI, oral membranes moist Neck: supple Cardiovascular: RRR without murmur. No JVD    Respiratory: CTA Bilaterally without wheezes or rales. Normal effort    GI: BS +, non-tender, non-distended  Musculoskeletal:No edema.  No tenderness. Neurological: Non-English-speaking [place] male.  aphasia + language.  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  No withdrawal to pinch in RUE or RLE Spontaneously moving LUE/LLE  Skin: warm and dry. Intact. Psychiatric:Flat  Assessment/Plan: 1. Functional deficits secondary to multifocal bilateral infarcts which require 3+ hours per day of interdisciplinary therapy in a comprehensive inpatient rehab setting. Physiatrist is providing close team supervision and 24 hour management of active medical problems listed below. Physiatrist and rehab team continue to assess barriers to discharge/monitor patient progress toward functional and medical goals.  Function:  Bathing Bathing position   Position: Shower  Bathing parts Body parts bathed by patient: Right arm, Chest, Abdomen, Front perineal area, Right upper leg, Left upper leg, Left lower leg, Right lower leg Body parts bathed by helper: Back, Buttocks, Left arm  Bathing assist Assist Level: Touching or steadying assistance(Pt > 75%)  Upper Body Dressing/Undressing Upper body dressing   What is the patient wearing?: Pull over shirt/dress     Pull over shirt/dress - Perfomed by patient:  Thread/unthread left sleeve, Put head through opening, Pull shirt over trunk Pull over shirt/dress - Perfomed by helper: Thread/unthread right sleeve        Upper body assist Assist Level: Touching or steadying assistance(Pt > 75%)      Lower Body Dressing/Undressing Lower body dressing   What is the patient wearing?: Pants   Underwear - Performed by helper: Thread/unthread right underwear leg, Thread/unthread left underwear leg, Pull underwear up/down Pants- Performed by patient: Thread/unthread left pants leg, Thread/unthread right pants leg, Pull pants up/down Pants- Performed by helper: Thread/unthread right pants leg Non-skid slipper socks- Performed by patient: Don/doff left sock Non-skid slipper socks- Performed by helper: Don/doff right sock, Don/doff left sock               TED Hose - Performed by helper: Don/doff right TED hose  Lower body assist Assist for lower body dressing: Touching or steadying assistance (Pt > 75%)      Toileting Toileting Toileting activity did not occur: No continent bowel/bladder event Toileting steps completed by patient: Adjust clothing prior to toileting, Performs perineal hygiene, Adjust clothing after toileting Toileting steps completed by helper: Performs perineal hygiene    Toileting assist Assist level: Touching or steadying assistance (Pt.75%)   Transfers Chair/bed transfer   Chair/bed transfer method: Stand pivot Chair/bed transfer assist level: Touching or steadying assistance (Pt > 75%) Chair/bed transfer assistive device: Armrests Mechanical lift: Stedy   Locomotion Ambulation Ambulation activity did not occur: Safety/medical concerns   Max distance: 50 ft Assist level: Moderate assist (Pt 50 - 74%)   Wheelchair   Type: Manual Max wheelchair distance: 150' Assist Level: Supervision or verbal cues  Cognition Comprehension Comprehension assist level: Understands basic less than 25% of the time/ requires cueing >75% of  the time  Expression Expression assist level: Expresses basis less than 25% of the time/requires cueing >75% of the time.  Social Interaction Social Interaction assist level: Interacts appropriately less than 25% of the time. May be withdrawn or combative.  Problem Solving Problem solving assist level: Solves basic less than 25% of the time - needs direction nearly all the time or does not effectively solve problems and may need a restraint for safety  Memory Memory assist level: Recognizes or recalls 25 - 49% of the time/requires cueing 50 - 75% of the time    Medical Problem List and Plan: 1.Right-sided weakness and slurred speechsecondary to multifocal acute ischemia within the left hemisphere, predominantly within the deep watershed zone and left ACA/MCA cortical watershed zone. Small subacute infarct within the right side white matter adjacent to the corpus callosum splenium on 5/26 On ASA/Plavix  Continue CIR PT, OT, SLP- 2. DVT Prophylaxis/Anticoagulation: Subcutaneous heparin change to lovenox to reduce freq of sq inj 3. Pain Management:Tylenol as needed 4. Mood:Provide emotional support 5. Neuropsych: This patientis? capable of making decisions on hisown behalf. 6. Skin/Wound Care:Routine skin checks 7. 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 Procedure based on timeframe of rehab services 9.Hyperlipidemia. Lipitor 10.  Hypertension  Lisinopril 2.5 mg daily Vitals:   03/10/18 0452 03/10/18 1408  BP: 109/74 115/75  Pulse: 85 86  Resp: 18 18  Temp: 97.6 F (36.4 C)  SpO2: 97% 98%  good control 6/20  11. anemia-resolved  Hb 12.6 on 5/31  Recheck 6/3 13.5 12. Postoperative dysphagia  D1 nectar liquids-continue to push p.o.  Advance diet as tolerated 13.  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

## 2018-03-10 NOTE — Progress Notes (Signed)
Modified Barium Swallow Progress Note  Patient Details  Name: Jorge Burns MRN: [id number] Date of Birth: 02/11/1941  Today's Date: 03/10/2018  Modified Barium Swallow completed.  Full report located under Chart Review in the Imaging Section.  Brief recommendations include the following:  Clinical Impression  Patient' swallowing function has improved since initial MBS as well as his arousal, attention and ability to follow commands. Patient demonstrates a mild oral dysphagia characterized by mildly prolonged mastication and right anterior spillage due to oral weakness. Patient independently utilized a chin down posture throughout study which appeared to improve his oral containment and overall airway protection. However, towards end of study, patient demonstrated 1 episode of premature spillage to the pyriform sinuses resulting in trace penetration with large bolus when his head was hyperextended. Patient's pharyngeal phase is characterized by a delayed swallow initiation to the valleculae with all consistencies with strength appearing WFL without residuals.  Recommend patient upgrade to Dys. 2 textures with thin liquids via cup with strict aspiration precautions. Discussed results with patient with interpreter, he verbalized understanding and agreement.    Swallow Evaluation Recommendations       SLP Diet Recommendations: Dysphagia 2 (Fine chop) solids;Thin liquid   Liquid Administration via: Cup;No straw   Medication Administration: Crushed with puree   Supervision: Full supervision/cueing for compensatory strategies;Patient able to self feed   Compensations: Slow rate;Small sips/bites;Minimize environmental distractions;Monitor for anterior loss   Postural Changes: Seated upright at 90 degrees   Oral Care Recommendations: Oral care BID       Feliberto Gottronourtney Marvia Troost, MA, CCC-SLP [phone]  Ryshawn Sanzone 03/10/2018,12:40 PM

## 2018-03-10 NOTE — Progress Notes (Signed)
Occupational Therapy Session Note  Patient Details  Name: Jorge Burns MRN: [id number] Date of Birth: Mar 06, 1941  Today's Date: 03/10/2018 OT Individual Time: 1430-1530 OT Individual Time Calculation (min): 60 min   Short Term Goals: Week 3:  OT Short Term Goal 1 (Week 3): Pt will complete toilet transfer with mod A OT Short Term Goal 2 (Week 3): Pt will maintain balance with mod A in prep for clothing managent when toileting OT Short Term Goal 3 (Week 3): Pt will use grooming objects appropriately with no more than min verbal cues  Skilled Therapeutic Interventions/Progress Updates:    Pt greeted seated in wc with family and interpreter present. Treatment session focused on pt/family education. Educated pt's family on use of gait belt for all transfers. Practiced donning/doffing gait belt and proper fit. Practiced multiple sit<>stands at the sink and educated on use for BADLs such as bathing/dressing. Pt then asked if he needed to go to the bathroom and he communicated "yes" with family. Pt completed stand-pivot to commode with Min A from OT. Eduacted family on body mechanics and positioning to best assist with transfer. Pt with continent void of bowel and bladder. Worked on hip hike for pt to complete toileting which he was able to do with verbal and tactile cues as pt continues to be apraxic and brings wash cloth to face instead of buttocks initially. Discussed home bathroom set-up including safe options for showering vs sponge bathing at home. Educated family on wc mechanics including leg rests and 1/2 lap tray. Discussed DME need sand answered any questions regarding BADL participation at home. Pt left seated in wc at end of session with family present and needs met.   Therapy Documentation Precautions:  Precautions Precautions: Fall Precaution Comments: non-English speaking Restrictions Weight Bearing Restrictions: No Pain:  none/denies pain See Function Navigator for Current Functional  Status.   Therapy/Group: Individual Therapy  Valma Cava 03/10/2018, 3:43 PM

## 2018-03-11 ENCOUNTER — Inpatient Hospital Stay (HOSPITAL_COMMUNITY): Payer: Medicare Other | Admitting: Occupational Therapy

## 2018-03-11 ENCOUNTER — Inpatient Hospital Stay (HOSPITAL_COMMUNITY): Payer: Medicare Other | Admitting: Speech Pathology

## 2018-03-11 ENCOUNTER — Inpatient Hospital Stay (HOSPITAL_COMMUNITY): Payer: Medicare Other | Admitting: Physical Therapy

## 2018-03-11 MED ORDER — CLOPIDOGREL BISULFATE 75 MG PO TABS: 75.0000 mg | ORAL_TABLET | Freq: Every day | ORAL | 1 refills | Status: DC

## 2018-03-11 MED ORDER — LISINOPRIL 2.5 MG PO TABS: 2.5000 mg | ORAL_TABLET | Freq: Every day | ORAL | 1 refills | Status: DC

## 2018-03-11 MED ORDER — ATORVASTATIN CALCIUM 80 MG PO TABS: 80.0000 mg | ORAL_TABLET | Freq: Every day | ORAL | 0 refills | Status: DC

## 2018-03-11 MED ORDER — CITALOPRAM HYDROBROMIDE 10 MG PO TABS: 10.0000 mg | ORAL_TABLET | Freq: Every day | ORAL | 1 refills | Status: DC

## 2018-03-11 MED ORDER — METHYLPHENIDATE HCL 10 MG PO TABS: 10.0000 mg | ORAL_TABLET | Freq: Two times a day (BID) | ORAL | 0 refills | Status: DC

## 2018-03-11 NOTE — Discharge Instructions (Signed)
Inpatient Rehab Dis[facility] Instructions  [facility] Discharge date and time: No discharge date for patient encounter.   Activities/Precautions/ Functional Status: Activity: activity as tolerated Diet:  Wound Care: keep wound clean and dry Functional status:  ___ No restrictions     ___ Walk up steps independently ___ 24/7 supervision/assistance   ___ Walk up steps with assistance ___ Intermittent supervision/assistance  ___ Bathe/dress independently ___ Walk with walker     _x__ Bathe/dress with assistance ___ Walk Independently    ___ Shower independently ___ Walk with assistance    ___ Shower with assistance ___ No alcohol     ___ Return to work/school ________  Special Instructions:  Follow-up Dr. Darrick PennaFields phone [phone] vascular surgery in regards to possible need for left ICA stenosis  Follow-up interventional radiology Dr. Corliss Skainseveshwar phone [phone]   COMMUNITY REFERRALS UPON DISCHARGE:    Home Health:   PT, OT, SP, SW, RN  Agency:[facility]   Phone:[phone]   Date of last service:03/12/2018  Medical Equipment/Items Ordered:WHEELCHAIR, BEDSIDE COMMODE &  Norberto SorensonHEMI WALKER  Agency/Supplier:[facility]   [phone]    STROKE/TIA DISCHARGE INSTRUCTIONS SMOKING Cigarette smoking nearly doubles your risk of having a stroke & is the single most alterable risk factor  If you smoke or have smoked in the last 12 months, you are advised to quit smoking for your health.  Most of the excess cardiovascular risk related to smoking disappears within a year of stopping.  Ask you doctor about anti-smoking medications  [place] Quit Line: 1-800-QUIT NOW  Free Smoking Cessation Classes [phone]  CHOLESTEROL Know your levels; limit fat & cholesterol in your diet  Lipid Panel     Component Value Date/Time   CHOL 213 (H) 02/13/2018 0855   TRIG 98 02/13/2018 0855   HDL 42 02/13/2018 0855   CHOLHDL 5.1 02/13/2018 0855   VLDL 20 02/13/2018 0855   LDLCALC 151 (H) 02/13/2018  0855      Many patients benefit from treatment even if their cholesterol is at goal.  Goal: Total Cholesterol (CHOL) less than 160  Goal:  Triglycerides (TRIG) less than 150  Goal:  HDL greater than 40  Goal:  LDL (LDLCALC) less than 100   BLOOD PRESSURE American Stroke Association blood pressure target is less that 120/80 mm/Hg  Your discharge blood pressure is:  BP: (!) 143/78  Monitor your blood pressure  Limit your salt and alcohol intake  Many individuals will require more than one medication for high blood pressure  DIABETES (A1c is a blood sugar average for last 3 months) Goal HGBA1c is under 7% (HBGA1c is blood sugar average for last 3 months)  Diabetes: No known diagnosis of diabetes    Lab Results  Component Value Date   HGBA1C 5.7 (H) 02/13/2018     Your HGBA1c can be lowered with medications, healthy diet, and exercise.  Check your blood sugar as directed by your physician  Call your physician if you experience unexplained or low blood sugars.  PHYSICAL ACTIVITY/REHABILITATION Goal is 30 minutes at least 4 days per week  Activity: Increase activity slowly, Therapies: Physical Therapy: Home Health Return to work:   Activity decreases your risk of heart attack and stroke and makes your heart stronger.  It helps control your weight and blood pressure; helps you relax and can improve your mood.  Participate in a regular exercise program.  Talk with your doctor about the best form of exercise for you (dancing, walking, swimming, cycling).  DIET/WEIGHT Goal  is to maintain a healthy weight  Your discharge diet is:  Diet Order           DIET - DYS 1 Room service appropriate? Yes; Fluid consistency: Nectar Thick  Diet effective now          liquids Your height is:    Your current weight is: Weight: 57.8 kg (127 lb 6.8 oz) Your Body Mass Index (BMI) is:  BMI (Calculated): 20.58  Following the type of diet specifically designed for you will help prevent another  stroke.  Your goal weight range is:    Your goal Body Mass Index (BMI) is 19-24.  Healthy food habits can help reduce 3 risk factors for stroke:  High cholesterol, hypertension, and excess weight.  RESOURCES Stroke/Support Group:  Call [phone]   STROKE EDUCATION PROVIDED/REVIEWED AND GIVEN TO PATIENT Stroke warning signs and symptoms How to activate emergency medical system (call 911). Medications prescribed at discharge. Need for follow-up after discharge. Personal risk factors for stroke. Pneumonia vaccine given:  Flu vaccine given:  My questions have been answered, the writing is legible, and I understand these instructions.  I will adhere to these goals & educational materials that have been provided to me after my discharge from the hospital.    for arteriogram possible need for revascularization of ICA stenosis  My questions have been answered and I understand these instructions. I will adhere to these goals and the provided educational materials after my discharge from the hospital.  Patient/Caregiver Signature _______________________________ Date __________  Clinician Signature _______________________________________ Date __________  Please bring this form and your medication list with you to all your follow-up doctor's appointments.

## 2018-03-11 NOTE — Discharge Summary (Signed)
Discharge summary job [phone]

## 2018-03-11 NOTE — Progress Notes (Signed)
Social Work  Discharge Note  The overall goal for the admission was met for: DC-SAT 6/22  Discharge location: Yes-HOME WITH Jorge Burns AND Jorge FAMILY[facility]  Length of Stay: Yes-22 DAYS  Discharge activity level: Yes-MIN ASSIST WHEELCHAIR LEVEL  Home/community participation: Yes  Services provided included: MD, RD, PT, OT, SLP, RN, CM, TR, Pharmacy and SW  Financial Services: Medicare  Follow-up services arranged: Home Health: [facility]-PT,OT,SP,RN,SW, DME: [place] and Patient/Family has no preference for HH/DME agencies  Comments (or additional information):Jorge Burns AND Jorge Burns WERE HERE TO [facility] AND CAN NOT BE LEFT ALONE AT HOME. HAVE ASKED FOR A HHSW TO ASSIST IF DOESN'T WORK OUT AT HOME AND TO PURSUE OTHER COMMUNITY SERVICES.  Patient/Family verbalized understanding of follow-up arrangements: Yes  Individual responsible for coordination of the follow-up plan: Jorge Burns-GRANDDAUGHTER AND Jorge Burns  Confirmed correct DME delivered: [name] 03/11/2018    [name]

## 2018-03-11 NOTE — Progress Notes (Signed)
Occupational Therapy Discharge Summary  Patient Details  Name: Jorge Burns MRN: [id number] Date of Birth: 1941/02/15   Patient has met 12 of 12 long term goals due to improved activity tolerance, improved balance, postural control, ability to compensate for deficits, functional use of  RIGHT upper and RIGHT lower extremity, improved attention, improved awareness and improved coordination.  Patient to discharge at [facility] Assist level.  Patient's care partner is independent to provide the necessary physical and cognitive assistance at discharge.    Reasons goals not met:  n/a  Recommendation:  Patient will benefit from ongoing skilled OT services in home health setting to continue to advance functional skills in the area of BADL and Reduce care partner burden.  Equipment: 16x16 wc, 3-in-1 BSC, hemi-walker, 1/2 lap tray  Reasons for discharge: treatment goals met and discharge from hospital  Patient/family agrees with progress made and goals achieved: Yes  OT Discharge Precautions/Restrictions  Precautions Precautions: Fall Precaution Comments: R hemiplegia Restrictions Weight Bearing Restrictions: No Pain  none/denies pain ADL ADL ADL Comments: Please see functional navigator Perception  Perception: Impaired Inattention/Neglect: Does not attend to right visual field;Does not attend to right side of body Praxis Praxis: Impaired Praxis Impairment Details: Ideation;Initiation;Motor planning Cognition Overall Cognitive Status: Impaired/Different from baseline Arousal/Alertness: Awake/alert Orientation Level: Oriented to person Attention: Sustained Focused Attention: Appears intact Sustained Attention: Impaired Awareness: Impaired Awareness Impairment: Intellectual impairment Problem Solving: Impaired Problem Solving Impairment: Verbal basic;Functional basic Behaviors: Impulsive Safety/Judgment: Impaired Comments: Mildly impulsive when asked to perform task, otherwise  does not attempt to perform unsafe activity when at rest Sensation Sensation Light Touch: Impaired by gross assessment Light Touch Impaired Details: Impaired RUE;Impaired RLE Proprioception: Impaired by gross assessment Coordination Gross Motor Movements are Fluid and Coordinated: No Fine Motor Movements are Fluid and Coordinated: No Coordination and Movement Description: R UE clonus Finger Nose Finger Test: Overshooting, difficulty understanidng concept initially Motor  Motor Motor: Hemiplegia;Clonus;Abnormal tone Motor - Skilled Clinical Observations: dense R hemi Motor - Discharge Observations: R hemiplegia Mobility  Bed Mobility Bed Mobility: Rolling Right;Rolling Left;Supine to Sit;Sit to Supine Rolling Right: Supervision/verbal cueing Rolling Left: Supervision/Verbal cueing Supine to Sit: Contact Guard/Touching assist Supine to Sit Details: Verbal cues for technique;Verbal cues for precautions/safety Sit to Supine: Contact Guard/Touching assist Sit to Supine - Details: Verbal cues for precautions/safety;Verbal cues for technique Transfers Sit to Stand: Contact Guard/Touching assist Stand to Sit: Contact Guard/Touching assist  Balance Static Sitting Balance Static Sitting - Balance Support: No upper extremity supported;Feet supported Static Sitting - Level of Assistance: 5: Stand by assistance Dynamic Sitting Balance Dynamic Sitting - Balance Support: No upper extremity supported;Feet supported Dynamic Sitting - Level of Assistance: 5: Stand by assistance Static Standing Balance Static Standing - Balance Support: Left upper extremity supported Static Standing - Level of Assistance: 5: Stand by assistance Dynamic Standing Balance Dynamic Standing - Balance Support: During functional activity Dynamic Standing - Level of Assistance: 4: Min assist Extremity/Trunk Assessment RUE Assessment RUE Assessment: Exceptions to [facility] RUE Body System: Neuro Brunstrum levels for arm  and hand: Arm;Hand Brunstrum level for arm: Stage I Presynergy Brunstrum level for hand: Stage II Synergy is developing RUE Tone RUE Tone: Modified Ashworth Body Part - Modified Ashworth Scale: Wrist Wrist - Modified Ashworth Scale for Grading Hypertonia RUE: Slight increase in muscle tone, manifested by a catch, followed by minimal resistance throughout the remainder (less than half) of the ROM LUE Assessment LUE Assessment: Within Functional Limits   See Function Navigator for Current  Functional Status.  Jorge Burns 03/11/2018, 2:33 PM

## 2018-03-11 NOTE — Progress Notes (Signed)
Social Work Patient ID: Engineer, manufacturing systemsHuoi Burns, male   DOB: 10/23/1940, 77 y.o.   MRN: [id number]  Family education went well yesterday according to granddaughter. Plan still to take pt home Sat. Have ordered equipment and follow up home health, pt upgraded on diet and doing well. He is ready to go home and be with his family.

## 2018-03-11 NOTE — Discharge Summary (Signed)
NAMAlfonse Burns: [name], [name] MEDICAL RECORD [id number] ACCOUNT [id number] DATE OF BIRTH:09/14/1941 FACILITY: [facility] LOCATION: [facility] PHYSICIAN:ANDREW Wynn BankerKIRSTEINS, MD  DISCHARGE SUMMARY  DATE OF DISCHARGE:  03/12/2018  DATE OF ADMISSION:  02/18/2018  DATE OF DISCHARGE:  03/12/2018   DISCHARGE DIAGNOSES: 1.  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 2.  Pain management. 3.  Subcutaneous Lovenox for deep venous thrombosis prophylaxis. 4.  ICA stenosis with revascularization. 5.  Hyperlipidemia. 6.  Hypertension. 7.  Anemia. 8.  Dysphagia.  HOSPITAL COURSE:  A 77 year old right-handed [place] male reported CVA in 2017.  Lives with niece and assistance is needed.  He presented 02/13/2018 with headache, right-sided weakness, slurred speech.  Cranial CT scan unremarkable.  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  The patient did not receive tPA.  MRI showed multifocal acute ischemia within the left hemisphere, predominantly within the deep watershed zone, small subacute infarction within the right side white matter adjacent to the corpus  callosum.  MRI of the head with no intracranial occlusion or high-grade stenosis.  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  CTA of head and neck showed 50% right ICA with 3-4 mm posterior projecting ulceration.  Critical stenosis left ICA.   Interventional radiology consulted and cerebral angiogram showed 95% left ICA proximal stenosis.  Echocardiogram with ejection fraction 55% grade I diastolic dysfunction.  EEG negative.  Carotid Doppler showed left ICA stenosis 80-99%.  Interventional  radiology followup 1 to 2 weeks for proximal left ICA revascularization.  Subcutaneous heparin for DVT prophylaxis.  Dysphagia 1 diet.  The patient was admitted for comprehensive rehabilitation program.  PAST MEDICAL HISTORY:  See discharge diagnoses.  SOCIAL HISTORY:  Lives with niece,  independent prior to admission.  FUNCTIONAL STATUS:  Upon admission to rehabilitation services, minimal assist 40 feet 2-person handheld assist, +2 physical assist sit to stand, max total assist with activities of daily living.  PHYSICAL EXAMINATION: VITAL SIGNS:  Blood pressure 138/78, pulse 62, temperature 97, respirations 18. GENERAL:  Alert [place] male, aphasic.  Limited exam due to language barrier.  EOMs intact. NECK:  Supple, nontender.  No JVD. CARDIOVASCULAR:  Rate controlled. ABDOMEN:  Soft, nontender.  Good bowel sounds. LUNGS:  Clear to auscultation without wheeze.  REHABILITATION HOSPITAL COURSE:  The patient was admitted to inpatient rehabilitation services.  Therapies initiated on a 3-hour daily basis, consisting of physical therapy, occupational therapy and rehabilitation nursing.  The following issues were  addressed during patient's rehabilitation stay:  Pertaining to the patient's multifocal acute ischemia, watershed left ACA-MCA infarction, remain on aspirin and Plavix therapy.  Would follow up with neurology services.  Subcutaneous Lovenox for DVT  prophylaxis.  Diet was a dysphagia #2 thin liquid diet followed by speech therapy.  Blood pressure is controlled with lisinopril.  Noted findings of ICA stenosis.  Plan was to follow up with vascular surgery, interventional radiology for ICA  revascularization, which could be addressed from an outpatient standpoint.  The patient at that time would have to have Plavix held.  This would be addressed as per vascular surgery.  Continued on Lipitor for hyperlipidemia.  The patient received weekly collaborative interdisciplinary team conferences to discuss estimated length of stay, family teaching, any barriers to discharge.  Sessions focused on stand pivot transfers and sit to supine.  Performed stand pivot transfers  to each side.  Close supervision to minimal assist overall.  The patient overall much improved with ongoing sessions.   Family  teaching continued as directed.  Granddaughter and her husband was present for family education.  Speech therapy continue to  follow for dysphagia and aphasia.  The patient was able to communicate simple needs with translator.  Full family teaching was completed and planned discharge to home.  DISCHARGE MEDICATIONS:  Included aspirin 81 mg p.o. daily, Lipitor 80 mg p.o. daily, Celexa 10 mg p.o. daily, Plavix 75 mg p.o. daily, lisinopril 2.5 mg p.o. daily, Ritalin 10 mg p.o. b.i.d., Tylenol as needed.    Diet was a dysphagia #2 with thin liquids.  The patient would follow up with Dr. Claudette Laws at the outpatient rehab center as directed.  Dr. Delia Heady, neurology service, call for appointment.  Dr. Fabienne Bruns vascular surgery, call for appointment to discuss possible need for left ICA  carotid surgery.  Dr. Corliss Skains, call for appointment for followup arteriogram to discuss possible need for ICA revascularization.  TN/NUANCE D:03/11/2018 T:03/11/2018 JOB:[id number]

## 2018-03-11 NOTE — Progress Notes (Signed)
Speech Language Pathology Discharge Summary  Patient Details  Name: Jorge Burns MRN: [id number] Date of Birth: [birth year]  Today's Date: 03/11/2018 SLP Individual Time: 1300-1330 SLP Individual Time Calculation (min): 30 min   Skilled Therapeutic Interventions:  Skilled session focused on completing education with granddaughter. Prior to session, SLP checked with nursing and they report increased consumption of dysphagia 1 diet over dysphagia 2 diet. Information provided to grand daughter on need to puree items for increased consumption, pt's need for 24 hour supervision and inability to demonstrate ability to problem solve basic task or demonstrate understanding of language or ability to express language (using multimodal attempts). Pt is Total Care and is at wheelchair level. Care provided stated that she might not be able to provide 24 hour supervision or Total A - she felt pt could be left alone in wheelchair with gait belt on for short periods of time. SLP stated this wasn't safe. CSW states that grand daughter has been provided with information on pt's need for continuous supervision. CSW will have HHSW follow pt at discharge. Education provided on diet consistencies.     Patient has met 6 of (Goals were not met) long term goals.  Patient to discharge at overall Total level.  Reasons goals not met: Pt is Total A for all communication (receptive and expressive) as well as decreased consumption of upgraded diet   Clinical Impression/Discharge Summary:   Pt has made minimal progress during skilled ST and as a result he remains nonverbal and unable to demonstrate receptive ability, safety awareness or problem solving.   Care Partner:  Caregiver Able to Provide Assistance: Other (comment)(Uncertain, grand daughter is not certain that she can provide 24 hour supervision)  Type of Caregiver Assistance: Physical;Cognitive  Recommendation:  Home Health SLP;24 hour supervision/assistance  Rationale  for SLP Follow Up: Maximize functional communication;Maximize cognitive function and independence;Maximize swallowing safety;Reduce caregiver burden   Equipment:     Reasons for discharge: Discharged from hospital   Patient/Family Agrees with Progress Made and Goals Achieved: Yes   Function:  Eating Eating   Modified Consistency Diet: Yes Eating Assist Level: Supervision or verbal cues;Set up assist for;More than reasonable amount of time   Eating Set Up Assist For: Opening containers       Cognition Comprehension Comprehension assist level: Understands basic 25 - 49% of the time/ requires cueing 50 - 75% of the time;Understands basic less than 25% of the time/ requires cueing >75% of the time  Expression   Expression assist level: Expresses basis less than 25% of the time/requires cueing >75% of the time.  Social Interaction Social Interaction assist level: Interacts appropriately less than 25% of the time. May be withdrawn or combative.  Problem Solving Problem solving assist level: Solves basic less than 25% of the time - needs direction nearly all the time or does not effectively solve problems and may need a restraint for safety  Memory Memory assist level: Recognizes or recalls less than 25% of the time/requires cueing greater than 75% of the time   Leyla Soliz 03/11/2018, 1:35 PM

## 2018-03-11 NOTE — Progress Notes (Signed)
[place] PHYSICAL MEDICINE & REHABILITATION     PROGRESS NOTE  Subjective/Complaints:  Patient with interpreter today.  According to speech therapy the patient eats pured diet better than d2 diet  ROS: Limited due to aphasia   Objective: Vital Signs: Blood pressure 131/83, pulse 67, temperature 98 F (36.7 C), temperature source Oral, resp. rate 18, height 5\' 6"  (1.676 m), weight 55 kg (121 lb 4.1 oz), SpO2 100 %. Dg Swallowing Func-speech Pathology  Result Date: 03/10/2018 Objective Swallowing Evaluation: Type of Study: MBS-Modified Barium Swallow Study  Patient Details Name: Kaelob Persky MRN: [id number] Date of Birth: 1941-01-15 Today's Date: 03/10/2018 Time: SLP Start Time (ACUTE ONLY): 0930 -SLP Stop Time (ACUTE ONLY): 1000 SLP Time Calculation (min) (ACUTE ONLY): 30 min Past Medical History: Past Medical History: Diagnosis Date . Hypertension  . Stroke [facility])   "a long time ago -- 2 years ago" Past Surgical History: Past Surgical History: Procedure Laterality Date . IR ANGIO INTRA EXTRACRAN SEL COM CAROTID INNOMINATE BILAT MOD SED  02/15/2018 . IR ANGIO VERTEBRAL SEL SUBCLAVIAN INNOMINATE UNI R MOD SED  02/15/2018 . IR ANGIO VERTEBRAL SEL VERTEBRAL UNI L MOD SED  02/15/2018 HPI: Mr. Ngoc Daughtridge is a 77 y.o. male with history of hypertension and a previous stroke presenting with right sided weakness and AMS. He did not receive IV t-PA due to late presentation. MRI showed multifocal acute ischemia within the left hemisphere due to severe left carotid stenosis. Pt initially passed stroke swallow screen, referred for swallow evaluation due to MD, RN concerns; MD downgraded to D1/nectar.  Repeat MRI 5/29 Expected evolution of the Left MCA and MCA/ACA watershed territory infarcts seen on 02/13/2018 with no associated hemorrhage or mass effect.  Subjective: Alert; interpretor present for study Assessment / Plan / Recommendation CHL IP CLINICAL IMPRESSIONS 03/10/2018 Clinical Impression Patient' swallowing function  has improved since initial MBS as well as his arousal, attention and ability to follow commands. Patient demonstrates a mild oral dysphagia characterized by mildly prolonged mastication and right anterior spillage due to oral weakness. Patient independently utilized a chin down posture throughout study which appeared to improve his oral containment and overall airway protection. However, towards end of study, patient demonstrated 1 episode of premature spillage to the pyriform sinuses resulting in trace penetration with large bolus when his head was hyperextended. Patient's pharyngeal phase is characterized by a delayed swallow initiation to the valleculae with all consistencies with strength appearing WFL without residuals.  Recommend patient upgrade to Dys. 2 textures with thin liquids via cup with strict aspiration precautions. Discussed results with patient with interpreter, he verbalized understanding and agreement. SLP Visit Diagnosis Dysphagia, oropharyngeal phase (R13.12) Attention and concentration deficit following -- Frontal lobe and executive function deficit following -- Impact on safety and function Moderate aspiration risk   CHL IP TREATMENT RECOMMENDATION 03/10/2018 Treatment Recommendations Therapy as outlined in treatment plan below   Prognosis 03/10/2018 Prognosis for Safe Diet Advancement Fair Barriers to Reach Goals Language deficits;Cognitive deficits;Severity of deficits Barriers/Prognosis Comment -- CHL IP DIET RECOMMENDATION 03/10/2018 SLP Diet Recommendations Dysphagia 2 (Fine chop) solids;Thin liquid Liquid Administration via Cup;No straw Medication Administration Crushed with puree Compensations Slow rate;Small sips/bites;Minimize environmental distractions;Monitor for anterior loss Postural Changes Seated upright at 90 degrees   CHL IP OTHER RECOMMENDATIONS 03/10/2018 Recommended Consults -- Oral Care Recommendations Oral care BID Other Recommendations --   CHL IP FOLLOW UP RECOMMENDATIONS  03/10/2018 Follow up Recommendations Home health SLP   CHL IP FREQUENCY AND DURATION 03/10/2018 Speech  Therapy Frequency (ACUTE ONLY) min 5x/week Treatment Duration 1 week      CHL IP ORAL PHASE 03/10/2018 Oral Phase Impaired Oral - Pudding Teaspoon -- Oral - Pudding Cup -- Oral - Honey Teaspoon -- Oral - Honey Cup -- Oral - Nectar Teaspoon -- Oral - Nectar Cup Delayed oral transit Oral - Nectar Straw -- Oral - Thin Teaspoon Delayed oral transit Oral - Thin Cup Right anterior bolus loss;Delayed oral transit;Premature spillage Oral - Thin Straw -- Oral - Puree Delayed oral transit Oral - Mech Soft Impaired mastication;Delayed oral transit Oral - Regular -- Oral - Multi-Consistency -- Oral - Pill -- Oral Phase - Comment --  CHL IP PHARYNGEAL PHASE 03/10/2018 Pharyngeal Phase Impaired Pharyngeal- Pudding Teaspoon -- Pharyngeal -- Pharyngeal- Pudding Cup -- Pharyngeal -- Pharyngeal- Honey Teaspoon -- Pharyngeal -- Pharyngeal- Honey Cup -- Pharyngeal -- Pharyngeal- Nectar Teaspoon -- Pharyngeal -- Pharyngeal- Nectar Cup Delayed swallow initiation-vallecula Pharyngeal -- Pharyngeal- Nectar Straw -- Pharyngeal -- Pharyngeal- Thin Teaspoon Delayed swallow initiation-vallecula Pharyngeal -- Pharyngeal- Thin Cup Delayed swallow initiation-vallecula;Penetration/Aspiration before swallow Pharyngeal Material enters airway, remains ABOVE vocal cords and not ejected out Pharyngeal- Thin Straw -- Pharyngeal -- Pharyngeal- Puree Delayed swallow initiation-vallecula Pharyngeal -- Pharyngeal- Mechanical Soft Delayed swallow initiation-vallecula Pharyngeal -- Pharyngeal- Regular -- Pharyngeal -- Pharyngeal- Multi-consistency -- Pharyngeal -- Pharyngeal- Pill -- Pharyngeal -- Pharyngeal Comment --  No flowsheet data found. No flowsheet data found. PAYNE, COURTNEY 03/10/2018, 12:41 PM  Feliberto Gottron, MA, CCC-SLP [phone]             No results for input(s): WBC, HGB, HCT, PLT in the last 72 hours. No results for input(s): NA, K, CL,  GLUCOSE, BUN, CREATININE, CALCIUM in the last 72 hours.  Invalid input(s): CO CBG (last 3)  No results for input(s): GLUCAP in the last 72 hours.  Wt Readings from Last 3 Encounters:  03/09/18 55 kg (121 lb 4.1 oz)  02/13/18 57.2 kg (126 lb 1.7 oz)  02/11/18 63.5 kg (140 lb)    Physical Exam:  BP 131/83 (BP Location: Left Arm)   Pulse 67   Temp 98 F (36.7 C) (Oral)   Resp 18   Ht 5\' 6"  (1.676 m)   Wt 55 kg (121 lb 4.1 oz)   SpO2 100%   BMI 19.57 kg/m  Constitutional: No distress . Vital signs reviewed. HEENT: EOMI, oral membranes moist Neck: supple Cardiovascular: RRR without murmur. No JVD    Respiratory: CTA Bilaterally without wheezes or rales. Normal effort    GI: BS +, non-tender, non-distended  Musculoskeletal:No edema.  No tenderness. Neurological: Non-English-speaking [place] male.  aphasia  Dense right hemiparesis upper and lower ext 0/5--without change.  No withdrawal to pinch in RUE or RLE Spontaneously moving LUE/LLE  Skin: warm and dry. Intact. Psychiatric:Flat  Assessment/Plan: 1. Functional deficits secondary to multifocal bilateral infarcts which require 3+ hours per day of interdisciplinary therapy in a comprehensive inpatient rehab setting. Physiatrist is providing close team supervision and 24 hour management of active medical problems listed below. Physiatrist and rehab team continue to assess barriers to discharge/monitor patient progress toward functional and medical goals.  Function:  Bathing Bathing position   Position: Shower  Bathing parts Body parts bathed by patient: Right arm, Chest, Abdomen, Front perineal area, Right upper leg, Left upper leg, Left lower leg, Right lower leg Body parts bathed by helper: Back, Buttocks, Left arm  Bathing assist Assist Level: Touching or steadying assistance(Pt > 75%)      Upper Body  Dressing/Undressing Upper body dressing   What is the patient wearing?: Pull over shirt/dress     Pull  over shirt/dress - Perfomed by patient: Thread/unthread left sleeve, Put head through opening, Pull shirt over trunk Pull over shirt/dress - Perfomed by helper: Thread/unthread right sleeve        Upper body assist Assist Level: Touching or steadying assistance(Pt > 75%)      Lower Body Dressing/Undressing Lower body dressing   What is the patient wearing?: Pants   Underwear - Performed by helper: Thread/unthread right underwear leg, Thread/unthread left underwear leg, Pull underwear up/down Pants- Performed by patient: Thread/unthread left pants leg, Thread/unthread right pants leg, Pull pants up/down Pants- Performed by helper: Thread/unthread right pants leg Non-skid slipper socks- Performed by patient: Don/doff left sock Non-skid slipper socks- Performed by helper: Don/doff right sock, Don/doff left sock               TED Hose - Performed by helper: Don/doff right TED hose  Lower body assist Assist for lower body dressing: Touching or steadying assistance (Pt > 75%)      Toileting Toileting Toileting activity did not occur: No continent bowel/bladder event Toileting steps completed by patient: Adjust clothing prior to toileting, Adjust clothing after toileting Toileting steps completed by helper: Performs perineal hygiene    Toileting assist Assist level: Touching or steadying assistance (Pt.75%)   Transfers Chair/bed transfer   Chair/bed transfer method: Stand pivot Chair/bed transfer assist level: Touching or steadying assistance (Pt > 75%) Chair/bed transfer assistive device: Armrests Mechanical lift: Stedy   Locomotion Ambulation Ambulation activity did not occur: Safety/medical concerns   Max distance: 50 ft Assist level: Touching or steadying assistance (Pt > 75%)   Wheelchair   Type: Manual Max wheelchair distance: 150' Assist Level: Supervision or verbal cues  Cognition Comprehension Comprehension assist level: Understands basic 50 - 74% of the time/  requires cueing 25 - 49% of the time  Expression Expression assist level: Expresses basis less than 25% of the time/requires cueing >75% of the time.  Social Interaction Social Interaction assist level: Interacts appropriately less than 25% of the time. May be withdrawn or combative.  Problem Solving Problem solving assist level: Solves basic less than 25% of the time - needs direction nearly all the time or does not effectively solve problems and may need a restraint for safety  Memory Memory assist level: Recognizes or recalls 25 - 49% of the time/requires cueing 50 - 75% of the time    Medical Problem List and Plan: 1.Right-sided weakness and slurred speechsecondary to multifocal acute ischemia within the left hemisphere, predominantly within the deep watershed zone and left ACA/MCA cortical watershed zone. Small subacute infarct within the right side white matter adjacent to the corpus callosum splenium on 5/26 On ASA/Plavix  Plan discharge in a.m.- 2. DVT Prophylaxis/Anticoagulation: Subcutaneous heparin change to lovenox to reduce freq of sq inj 3. Pain Management:Tylenol as needed 4. Mood:Provide emotional support 5. Neuropsych: This patientis? capable of making decisions on hisown behalf. 6. Skin/Wound Care:Routine skin checks 7. 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 Procedure based on timeframe of rehab services 9.Hyperlipidemia. Lipitor 10.  Hypertension  Lisinopril 2.5 mg daily Vitals:   03/10/18 2031 03/11/18 0503  BP: 113/77 131/83  Pulse: 67 67  Resp: 18 18  Temp: 98 F (36.7 C) 98 F (36.7 C)  SpO2: 98% 100%  good control 6/21  11. anemia-resolved  Hb 12.6 on 5/31  Recheck 6/3 13.5 12. Postoperative dysphagia  D1 nectar liquids-continue to push p.o.  Advance diet  as tolerated 13.  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

## 2018-03-11 NOTE — Progress Notes (Signed)
Occupational Therapy Session Note  Patient Details  Name: Jorge Burns MRN: [id number] Date of Birth: [birth year]  Today's Date: 03/11/2018  Session 1 OT Individual Time: 0903-1000 OT Individual Time Calculation (min): 57 min   Session 2 OT Individual Time: 1345-1413 OT Individual Time Calculation (min): 28 min   Short Term Goals: Week 3:  OT Short Term Goal 1 (Week 3): Pt will complete toilet transfer with mod A OT Short Term Goal 2 (Week 3): Pt will maintain balance with mod A in prep for clothing managent when toileting OT Short Term Goal 3 (Week 3): Pt will use grooming objects appropriately with no more than min verbal cues  Skilled Therapeutic Interventions/Progress Updates:    Session 1 Pt greeted semi-reclined in bed and agreeable to OT with interpreter present. Treatment session focused on increased independence within BADL tasks. Pt completed functional transfers to shower and wc with min A to L and R with improved head/hips relationship. Bathing completed shower level with overall min A. Dressing completed seated in wc with multimodal cues to utilize objects appropriately and to attend to R side. Details noted in flowsheet.   Session 2 Pt greeted sitting in wc with interpreter present. Pt brought to therapy gym and completed multiple sit<>stands with overall min A and min VC for hand placement. Incorporated R NMR with weight bearing on parallel bars, then facilitated horizontal adduction/abduction across bars. Pt returned to room at end of session and left seated in wc with chair alarm, safety belt, and needs met.   Therapy Documentation Precautions:  Precautions Precautions: Fall Precaution Comments: non-English speaking Restrictions Weight Bearing Restrictions: No Pain:   none/denies painPerception  Praxis Praxis: Impaired Praxis Impairment Details: Ideation;Initiation;Motor planning See Function Navigator for Current Functional Status.   Therapy/Group: Individual  Therapy  Valma Cava 03/11/2018, 2:13 PM

## 2018-03-11 NOTE — Progress Notes (Signed)
Physical Therapy Discharge Summary  Patient Details  Name: Jorge Burns MRN: [id number] Date of Birth: 07-Mar-1941  Today's Date: 03/11/2018 PT Individual Time: 1000-1055 PT Individual Time Calculation (min): 55 min   Pt in w/c and agreeable to therapy, no evidence of pain. Performed functional mobility as outlined below including w/c mobility, gait, functional transfers, and bed mobility. He continues to require manual assist for RLE placement during gait 2/2 hip flexor weakness but demonstrates improved postural control in upright requiring no skilled cues to reach midline during functional mobility. Performed Nustep 10 min @ level 4 for LE reciprocal movement pattern and increased R body awareness. Manual assist required for neutral RLE alignment, pt favors adduction/IR position. Returned to room and ended session in w/c, call bell within reach and all needs met.   Patient has met 8 of 9 long term goals due to improved activity tolerance, improved balance, improved postural control, increased strength, ability to compensate for deficits, improved attention, improved awareness and improved coordination.  Patient to discharge at a wheelchair level [place].   Patient's care partner is independent to provide the necessary physical assistance at discharge. Pt's granddaughter and her husband have been training on w/c bumping to enter home, w/c mobility, transfers, and bed mobility including safety w/ being in bed by himself and need for 24/7 supervision at d/c. Both verbalize understanding and in agreement. Additionally educated on pt requiring skilled assistance w/ gait at this time.   Reasons goals not met: Pt continues to require min assist for bed mobility 2/2 R inattention and RLE functional strength deficits.   Recommendation:  Patient will benefit from ongoing skilled PT services in home health setting to continue to advance safe functional mobility, address ongoing impairments in functional  balance, midline orientation, gait, functional mobility, and sequencing and motor planning of functional tasks, and minimize fall risk.  Equipment: w/c  Reasons for discharge: treatment goals met and discharge from hospital  Patient/family agrees with progress made and goals achieved: Yes  PT Discharge Precautions/Restrictions Precautions Precautions: Fall Precaution Comments: non-English speaking Restrictions Weight Bearing Restrictions: No Pain Pain Assessment Pain Scale: Faces Faces Pain Scale: No hurt Vision/Perception  Vision - Assessment Additional Comments: Unable to formally assess 2/2 cognition, appears unable to track to R visual field but will attend to R environment w/ cues Perception Perception: Impaired Inattention/Neglect: Does not attend to right visual field;Does not attend to right side of body Praxis Praxis: Impaired Praxis Impairment Details: Ideation;Initiation;Motor planning  Cognition Overall Cognitive Status: Impaired/Different from baseline Arousal/Alertness: Awake/alert Orientation Level: Oriented to person(unable to assess 2/2 cognition and remaining non-verbal) Memory: Impaired Awareness: Impaired Problem Solving: Impaired Behaviors: Impulsive Safety/Judgment: Impaired Comments: Mildly impulsive when asked to perform task, otherwise does not attempt to perform unsafe activity when at rest Sensation Sensation Light Touch: Impaired by gross assessment(unable to formally assess 2/2 cognition, pt acknowledging touch to RLE, however does not respond to painful stimuli) Light Touch Impaired Details: Impaired RUE;Impaired RLE Coordination Gross Motor Movements are Fluid and Coordinated: No Fine Motor Movements are Fluid and Coordinated: No Finger Nose Finger Test: Overshooting, difficulty understanidng concept initially Motor  Motor Motor: Hemiplegia;Clonus;Abnormal tone Motor - Discharge Observations: Mild R hemi, mild increase in R hamstring  and gastroc tone, RLE clonus  Mobility Bed Mobility Bed Mobility: Rolling Right;Rolling Left;Supine to Sit;Sit to Supine Rolling Right: Supervision/verbal cueing Rolling Left: Supervision/Verbal cueing Supine to Sit: Contact Guard/Touching assist Supine to Sit Details: Verbal cues for technique;Verbal cues for precautions/safety Sit to Supine:  Contact Guard/Touching assist Sit to Supine - Details: Verbal cues for precautions/safety;Verbal cues for technique Transfers Transfers: Stand to Sit;Sit to Stand;Stand Pivot Transfers Sit to Stand: Contact Guard/Touching assist Stand to Sit: Contact Guard/Touching assist Stand Pivot Transfers: Contact Guard/Touching assist Transfer (Assistive device): None Locomotion  Gait Ambulation: Yes Gait Assistance: Minimal Assistance - Patient > 75% Gait Distance (Feet): 50 Feet Assistive device: Hemi-walker Gait Assistance Details: Verbal cues for gait pattern;Verbal cues for safe use of DME/AE;Manual facilitation for weight shifting;Manual facilitation for placement;Manual facilitation for weight bearing;Verbal cues for technique;Tactile cues for initiation Gait Assistance Details: assistance primarily required for upright posture and R swing limb advancement Stairs / Additional Locomotion Stairs: No Wheelchair Mobility Wheelchair Mobility: Yes Wheelchair Assistance: Actor Assistance Details: Visual cues/gestures for precautions/safety Wheelchair Propulsion: Left upper extremity;Left lower extremity Wheelchair Parts Management: Supervision/cueing Distance: 150'  Trunk/Postural Assessment  Cervical Assessment Cervical Assessment: Within Functional Limits Thoracic Assessment Thoracic Assessment: Within Functional Limits Lumbar Assessment Lumbar Assessment: Within Functional Limits Postural Control Postural Control: Within Functional Limits  Balance Balance Balance Assessed: Yes Static Sitting Balance Static  Sitting - Balance Support: No upper extremity supported;Feet supported Static Sitting - Level of Assistance: 5: Stand by assistance Dynamic Sitting Balance Dynamic Sitting - Balance Support: No upper extremity supported;Feet supported Dynamic Sitting - Level of Assistance: 5: Stand by assistance Static Standing Balance Static Standing - Balance Support: Left upper extremity supported Static Standing - Level of Assistance: 5: Stand by assistance Dynamic Standing Balance Dynamic Standing - Balance Support: Left upper extremity supported;During functional activity Dynamic Standing - Level of Assistance: 4: Min assist Extremity Assessment  RUE Assessment RUE Assessment: Exceptions to [facility] RUE Body System: Neuro Brunstrum levels for arm and hand: Arm;Hand Brunstrum level for arm: Stage I Presynergy Brunstrum level for hand: Stage II Synergy is developing RUE Tone RUE Tone: Modified Ashworth Body Part - Modified Ashworth Scale: Wrist Wrist - Modified Ashworth Scale for Grading Hypertonia RUE: Slight increase in muscle tone, manifested by a catch, followed by minimal resistance throughout the remainder (less than half) of the ROM LUE Assessment LUE Assessment: Within Functional Limits RLE Assessment RLE Assessment: Exceptions to [facility] Passive Range of Motion (PROM) Comments: WNL Active Range of Motion (AROM) Comments: Unable to formally assess 2/2 cognition General Strength Comments: Unable to formally assess 2/2 cogntion, pt not volitionally moving extemity against gravity but demonstrates functional R quad strength during gait, no R hip flexion or ankle DF noticed LLE Assessment LLE Assessment: Within Functional Limits(Unable to formally assess but volitionally moving extremity against gravity)   See Function Navigator for Current Functional Status.  Sieara Bremer K Arnette 03/11/2018, 11:01 AM

## 2018-03-12 ENCOUNTER — Inpatient Hospital Stay (HOSPITAL_COMMUNITY): Payer: Medicare Other | Admitting: Occupational Therapy

## 2018-03-12 ENCOUNTER — Inpatient Hospital Stay (HOSPITAL_COMMUNITY): Payer: Medicare Other

## 2018-03-12 NOTE — Progress Notes (Signed)
Patient and family received discharge instructions from Deatra Inaan Angiulli, PA-C, 03/11/2018 with verbal understanding. All questions answered by this RN and Dr. Wynn BankerKirsteins. Patient discharged to home with niece, her husband and patient belongings.

## 2018-03-12 NOTE — Progress Notes (Signed)
[place] [facility]IC[facility] & REHABILITATION     PROGRESS NOTE  Subjective/Complaints:  Daughter at bedside discussed signs of CVA, discussed spasticity RUE and RLE  ROS: Limited due to aphasia   Objective: Vital Signs: Blood pressure 136/71, pulse 71, temperature 97.6 F (36.4 C), temperature source Oral, resp. rate 16, height 5\' 6"  (1.676 m), weight 55 kg (121 lb 4.1 oz), SpO2 99 %. No results found. No results for input(s): WBC, HGB, HCT, PLT in the last 72 hours. No results for input(s): NA, K, CL, GLUCOSE, BUN, CREATININE, CALCIUM in the last 72 hours.  Invalid input(s): CO CBG (last 3)  No results for input(s): GLUCAP in the last 72 hours.  Wt Readings from Last 3 Encounters:  03/09/18 55 kg (121 lb 4.1 oz)  02/13/18 57.2 kg (126 lb 1.7 oz)  02/11/18 63.5 kg (140 lb)    Physical Exam:  BP 136/71 (BP Location: Left Arm)   Pulse 71   Temp 97.6 F (36.4 C) (Oral)   Resp 16   Ht 5\' 6"  (1.676 m)   Wt 55 kg (121 lb 4.1 oz)   SpO2 99%   BMI 19.57 kg/m  Constitutional: No distress . Vital signs reviewed. HEENT: EOMI, oral membranes moist Neck: supple Cardiovascular: RRR without murmur. No JVD    Respiratory: CTA Bilaterally without wheezes or rales. Normal effort    GI: BS +, non-tender, non-distended  Musculoskeletal:No edema.  No tenderness. Neurological: Non-English-speaking [place] male.  aphasia  Dense right hemiparesis upper and lower ext 0/5--without change.  No withdrawal to pinch in RUE or RLE Spontaneously moving LUE/LLE  Skin: warm and dry. Intact. Psychiatric:Flat  Assessment/Plan: 1. Functional deficits secondary to multifocal bilateral infarcts which require 3+ hours per day of interdisciplinary therapy in a comprehensive inpatient rehab setting. Physiatrist is providing close team supervision and 24 hour management of active medical problems listed below. Physiatrist and rehab team continue to assess barriers to discharge/monitor patient  progress toward functional and medical goals.  Function:  Bathing Bathing position   Position: Shower  Bathing parts Body parts bathed by patient: Right arm, Chest, Abdomen, Front perineal area, Right upper leg, Left upper leg, Left lower leg, Right lower leg Body parts bathed by helper: Back, Buttocks, Left arm  Bathing assist Assist Level: Touching or steadying assistance(Pt > 75%)      Upper Body Dressing/Undressing Upper body dressing   What is the patient wearing?: Pull over shirt/dress     Pull over shirt/dress - Perfomed by patient: Thread/unthread left sleeve, Put head through opening, Pull shirt over trunk Pull over shirt/dress - Perfomed by helper: Thread/unthread right sleeve        Upper body assist Assist Level: Touching or steadying assistance(Pt > 75%)      Lower Body Dressing/Undressing Lower body dressing   What is the patient wearing?: Pants, Non-skid slipper socks   Underwear - Performed by helper: Thread/unthread right underwear leg, Thread/unthread left underwear leg, Pull underwear up/down Pants- Performed by patient: Thread/unthread right pants leg, Thread/unthread left pants leg, Pull pants up/down Pants- Performed by helper: Thread/unthread right pants leg Non-skid slipper socks- Performed by patient: Don/doff right sock, Don/doff left sock Non-skid slipper socks- Performed by helper: Don/doff right sock, Don/doff left sock               TED Hose - Performed by helper: Don/doff right TED hose  Lower body assist Assist for lower body dressing: Touching or steadying assistance (Pt > 75%)  Toileting Toileting Toileting activity did not occur: No continent bowel/bladder event Toileting steps completed by patient: Adjust clothing prior to toileting, Adjust clothing after toileting, Performs perineal hygiene Toileting steps completed by helper: Performs perineal hygiene    Toileting assist Assist level: Touching or steadying assistance  (Pt.75%)   Transfers Chair/bed transfer   Chair/bed transfer method: Stand pivot Chair/bed transfer assist level: Touching or steadying assistance (Pt > 75%) Chair/bed transfer assistive device: Armrests Mechanical lift: Stedy   Locomotion Ambulation Ambulation activity did not occur: Safety/medical concerns   Max distance: 50 ft Assist level: Touching or steadying assistance (Pt > 75%)   Wheelchair   Type: Manual Max wheelchair distance: 150' Assist Level: Supervision or verbal cues  Cognition Comprehension Comprehension assist level: Understands basic 25 - 49% of the time/ requires cueing 50 - 75% of the time  Expression Expression assist level: Expresses basis less than 25% of the time/requires cueing >75% of the time.  Social Interaction Social Interaction assist level: Interacts appropriately 25 - 49% of time - Needs frequent redirection.  Problem Solving Problem solving assist level: Solves basic 25 - 49% of the time - needs direction more than half the time to initiate, plan or complete simple activities  Memory Memory assist level: Recognizes or recalls 25 - 49% of the time/requires cueing 50 - 75% of the time    Medical Problem List and Plan: 1.Right-sided weakness and slurred speechsecondary to multifocal acute ischemia within the left hemisphere, predominantly within the deep watershed zone and left ACA/MCA cortical watershed zone. Small subacute infarct within the right side white matter adjacent to the corpus callosum splenium on 5/26 On ASA/Plavix  Plan discharge today.- 2. DVT Prophylaxis/Anticoagulation: Subcutaneous heparin change to lovenox to reduce freq of sq inj 3. Pain Management:Tylenol as needed 4. Mood:Provide emotional support 5. Neuropsych: This patientis? capable of making decisions on hisown behalf. 6. Skin/Wound Care:Routine skin checks 7. Fluids/Electrolytes/Nutrition:Routine in and outs  BMP within acceptable range on  6/3 Continue to encourage PO, I 580 ml recorded with meals yesterday 8.ICA stenosis.Follow up IR for possibleICA revascularization. Procedure based on timeframe of rehab services 9.Hyperlipidemia. Lipitor 10.  Hypertension  Lisinopril 2.5 mg daily Vitals:   03/11/18 2133 03/12/18 0628  BP: 131/83 136/71  Pulse: 79 71  Resp: 16 16  Temp:  97.6 F (36.4 C)  SpO2: 96% 99%  good control 6/22  11. anemia-resolved  Hb 12.6 on 5/31  Recheck 6/3 13.5 12. Postoperative dysphagia  D1 nectar liquids-continue to push p.o.  Advance diet as tolerated 13.  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

## 2018-03-13 DIAGNOSIS — I69391 Dysphagia following cerebral infarction: Secondary | ICD-10-CM | POA: Diagnosis not present

## 2018-03-13 DIAGNOSIS — Z7902 Long term (current) use of antithrombotics/antiplatelets: Secondary | ICD-10-CM | POA: Diagnosis not present

## 2018-03-13 DIAGNOSIS — I10 Essential (primary) hypertension: Secondary | ICD-10-CM | POA: Diagnosis not present

## 2018-03-13 DIAGNOSIS — I69354 Hemiplegia and hemiparesis following cerebral infarction affecting left non-dominant side: Secondary | ICD-10-CM | POA: Diagnosis not present

## 2018-03-13 DIAGNOSIS — D649 Anemia, unspecified: Secondary | ICD-10-CM | POA: Diagnosis not present

## 2018-03-13 DIAGNOSIS — E785 Hyperlipidemia, unspecified: Secondary | ICD-10-CM | POA: Diagnosis not present

## 2018-03-14 ENCOUNTER — Telehealth: Payer: Self-pay | Admitting: Registered Nurse

## 2018-03-14 NOTE — Telephone Encounter (Signed)
Transitional Care call Transitional care Call Questions answered by grand-daughter Jorge Burns  Patient name: Jorge Burns DOB: 31-May-1941 1. Are you/is patient experiencing any problems since coming home? No a. Are there any questions regarding any aspect of care? No 2. Are there any questions regarding medications administration/dosing? No a. Are meds being taken as prescribed? Yes b. "Patient should review meds with caller to confirm" Medication List reviewed 3. Have there been any falls? No 4. Has Home Health been to the house and/or have they contacted you? Yes, [facility] a. If not, have you tried to contact them? NA b. Can we help you contact them? NA 5. Are bowels and bladder emptying properly? Yes a. Are there any unexpected incontinence issues? No b. If applicable, is patient following bowel/bladder programs? NA 6. Any fevers, problems with breathing, unexpected pain? No 7. Are there any skin problems or new areas of breakdown? No 8. Has the patient/family member arranged specialty MD follow up (ie cardiology/neurology/renal/surgical/etc.)?  Jorge Burns in the process of scheduling follow up appointments, she states.  a. Can we help arrange? NA 9. Does the patient need any other services or support that we can help arrange? No 10. Are caregivers following through as expected in assisting the patient? Yes 11. Has the patient quit smoking, drinking alcohol, or using drugs as recommended? Jorge Burns reports her grand father doesn't smoke, drink alcohol or use illicit drugs.   Appointment date/time July 2nd, 2019,   arrival time 09:20 for 9:40 appointment with Jacalyn LefevreEunice Hadja Burns ANP-C. At [address]

## 2018-03-15 ENCOUNTER — Telehealth: Payer: Self-pay

## 2018-03-15 NOTE — Telephone Encounter (Signed)
Jeff,PT called requesting verbal orders for 2wk4, 1wk4 for PT. Verbal orders given per discharge summary.

## 2018-03-16 DIAGNOSIS — Z7902 Long term (current) use of antithrombotics/antiplatelets: Secondary | ICD-10-CM | POA: Diagnosis not present

## 2018-03-16 DIAGNOSIS — E785 Hyperlipidemia, unspecified: Secondary | ICD-10-CM | POA: Diagnosis not present

## 2018-03-16 DIAGNOSIS — I10 Essential (primary) hypertension: Secondary | ICD-10-CM | POA: Diagnosis not present

## 2018-03-16 DIAGNOSIS — I69391 Dysphagia following cerebral infarction: Secondary | ICD-10-CM | POA: Diagnosis not present

## 2018-03-16 DIAGNOSIS — I69354 Hemiplegia and hemiparesis following cerebral infarction affecting left non-dominant side: Secondary | ICD-10-CM | POA: Diagnosis not present

## 2018-03-16 DIAGNOSIS — D649 Anemia, unspecified: Secondary | ICD-10-CM | POA: Diagnosis not present

## 2018-03-17 ENCOUNTER — Telehealth: Payer: Self-pay | Admitting: *Deleted

## 2018-03-17 NOTE — Telephone Encounter (Signed)
Allen DerryAllison Mallory, ST, [facility]  Left a message [facility] for [facility] and social work to address the need for skilled nursing facility.  Chart reviewed, verbalk orders given per office protocol.

## 2018-03-18 DIAGNOSIS — I10 Essential (primary) hypertension: Secondary | ICD-10-CM | POA: Diagnosis not present

## 2018-03-18 DIAGNOSIS — Z7902 Long term (current) use of antithrombotics/antiplatelets: Secondary | ICD-10-CM | POA: Diagnosis not present

## 2018-03-18 DIAGNOSIS — E785 Hyperlipidemia, unspecified: Secondary | ICD-10-CM | POA: Diagnosis not present

## 2018-03-18 DIAGNOSIS — I69354 Hemiplegia and hemiparesis following cerebral infarction affecting left non-dominant side: Secondary | ICD-10-CM | POA: Diagnosis not present

## 2018-03-18 DIAGNOSIS — D649 Anemia, unspecified: Secondary | ICD-10-CM | POA: Diagnosis not present

## 2018-03-18 DIAGNOSIS — I69391 Dysphagia following cerebral infarction: Secondary | ICD-10-CM | POA: Diagnosis not present

## 2018-03-21 DIAGNOSIS — E785 Hyperlipidemia, unspecified: Secondary | ICD-10-CM | POA: Diagnosis not present

## 2018-03-21 DIAGNOSIS — I10 Essential (primary) hypertension: Secondary | ICD-10-CM | POA: Diagnosis not present

## 2018-03-21 DIAGNOSIS — I69391 Dysphagia following cerebral infarction: Secondary | ICD-10-CM | POA: Diagnosis not present

## 2018-03-21 DIAGNOSIS — Z7902 Long term (current) use of antithrombotics/antiplatelets: Secondary | ICD-10-CM | POA: Diagnosis not present

## 2018-03-21 DIAGNOSIS — D649 Anemia, unspecified: Secondary | ICD-10-CM | POA: Diagnosis not present

## 2018-03-21 DIAGNOSIS — I69354 Hemiplegia and hemiparesis following cerebral infarction affecting left non-dominant side: Secondary | ICD-10-CM | POA: Diagnosis not present

## 2018-03-22 ENCOUNTER — Telehealth: Payer: Self-pay | Admitting: *Deleted

## 2018-03-22 ENCOUNTER — Encounter: Payer: Medicare Other | Attending: Registered Nurse | Admitting: Registered Nurse

## 2018-03-22 ENCOUNTER — Other Ambulatory Visit: Payer: Self-pay

## 2018-03-22 ENCOUNTER — Encounter (HOSPITAL_COMMUNITY): Payer: Self-pay | Admitting: Emergency Medicine

## 2018-03-22 ENCOUNTER — Ambulatory Visit (HOSPITAL_COMMUNITY)
Admission: EM | Admit: 2018-03-22 | Discharge: 2018-03-22 | Disposition: A | Discharge status: Home / Self Care | Payer: Medicare Other | Attending: Family Medicine | Admitting: Family Medicine

## 2018-03-22 ENCOUNTER — Encounter: Payer: Self-pay | Admitting: Registered Nurse

## 2018-03-22 VITALS — BP 131/57 | HR 84 | Ht 66.0 in

## 2018-03-22 DIAGNOSIS — I10 Essential (primary) hypertension: Secondary | ICD-10-CM | POA: Diagnosis not present

## 2018-03-22 DIAGNOSIS — Z8249 Family history of ischemic heart disease and other diseases of the circulatory system: Secondary | ICD-10-CM | POA: Insufficient documentation

## 2018-03-22 DIAGNOSIS — K648 Other hemorrhoids: Secondary | ICD-10-CM | POA: Diagnosis not present

## 2018-03-22 DIAGNOSIS — G8191 Hemiplegia, unspecified affecting right dominant side: Secondary | ICD-10-CM | POA: Diagnosis not present

## 2018-03-22 DIAGNOSIS — Z8673 Personal history of transient ischemic attack (TIA), and cerebral infarction without residual deficits: Secondary | ICD-10-CM | POA: Insufficient documentation

## 2018-03-22 DIAGNOSIS — E785 Hyperlipidemia, unspecified: Secondary | ICD-10-CM | POA: Diagnosis not present

## 2018-03-22 DIAGNOSIS — Z9889 Other specified postprocedural states: Secondary | ICD-10-CM | POA: Diagnosis not present

## 2018-03-22 DIAGNOSIS — R4701 Aphasia: Secondary | ICD-10-CM | POA: Diagnosis not present

## 2018-03-22 DIAGNOSIS — I6389 Other cerebral infarction: Secondary | ICD-10-CM

## 2018-03-22 DIAGNOSIS — D649 Anemia, unspecified: Secondary | ICD-10-CM | POA: Diagnosis not present

## 2018-03-22 DIAGNOSIS — I69391 Dysphagia following cerebral infarction: Secondary | ICD-10-CM | POA: Diagnosis not present

## 2018-03-22 DIAGNOSIS — I69354 Hemiplegia and hemiparesis following cerebral infarction affecting left non-dominant side: Secondary | ICD-10-CM | POA: Diagnosis not present

## 2018-03-22 DIAGNOSIS — K625 Hemorrhage of anus and rectum: Secondary | ICD-10-CM

## 2018-03-22 DIAGNOSIS — I639 Cerebral infarction, unspecified: Secondary | ICD-10-CM

## 2018-03-22 DIAGNOSIS — I69351 Hemiplegia and hemiparesis following cerebral infarction affecting right dominant side: Secondary | ICD-10-CM

## 2018-03-22 DIAGNOSIS — Z09 Encounter for follow-up examination after completed treatment for conditions other than malignant neoplasm: Secondary | ICD-10-CM | POA: Diagnosis not present

## 2018-03-22 DIAGNOSIS — Z7902 Long term (current) use of antithrombotics/antiplatelets: Secondary | ICD-10-CM | POA: Diagnosis not present

## 2018-03-22 LAB — POCT I-STAT, CHEM 8
BUN: 10 mg/dL (ref 8–23)
Calcium, Ion: 1.31 mmol/L (ref 1.15–1.40)
Chloride: 105 mmol/L (ref 98–111)
Creatinine, Ser: 0.9 mg/dL (ref 0.61–1.24)
Glucose, Bld: 103 mg/dL — ABNORMAL HIGH (ref 70–99)
HCT: 40 % (ref 39.0–52.0)
Hemoglobin: 13.6 g/dL (ref 13.0–17.0)
Potassium: 3.7 mmol/L (ref 3.5–5.1)
Sodium: 144 mmol/L (ref 135–145)
TCO2: 25 mmol/L (ref 22–32)

## 2018-03-22 MED ORDER — HYDROCORTISONE 2.5 % RE CREA: TOPICAL_CREAM | RECTAL | 0 refills | Status: DC

## 2018-03-22 NOTE — ED Triage Notes (Signed)
[facility] a [facility] possible [facility] x 1 week.

## 2018-03-22 NOTE — Progress Notes (Signed)
Subjective:    Patient ID: Jorge Burns, male    DOB: 1941/04/06, 77 y.o.   MRN: [id number]  HPI: Jorge Burns is a 77 year old [place]et[place] male whois here for transitional care visit in follow up of his acute bilateral watershed infarction, right hemiparesis, essential hypertension and expressive aphasia. He has been home with home health therapies with [facility], He denies pain. He rates his pain 0.   Translator in room and [name], [name] states she is not Jorge Burns biologically daughter, they are friends from the community. She ( [name]) also states she did not follow up with his doctor's appointment and scheduled his surgical appointments due to the above as well.   [name] has spoken with the Social Worker from [facility] to have Jorge Burns placed in a nursing home, they are unable to take care of all his needs she states.   Pain Inventory Average Pain 0 Pain Right Now 0 My pain is na  In the last 24 hours, has pain interfered with the following? General activity 0 Relation with others 0 Enjoyment of life 0 What TIME of day is your pain at its worst? na Sleep (in general) Good  Pain is worse with: na Pain improves with: na Relief from Meds: 0  Mobility walk with assistance use a walker ability to climb steps?  no do you drive?  no  Function employed # of hrs/week 40 I need assistance with the following:  feeding, dressing, bathing, toileting, meal prep, household duties and shopping  Neuro/Psych weakness numbness trouble walking spasms dizziness confusion depression  Prior Studies Any changes since last visit?  no  Physicians involved in your care Any changes since last visit?  no   No family history on file. Social History   Socioeconomic History  . Marital status: Single    Spouse name: Not on file  . Number of children: Not on file  . Years of education: Not on file  . Highest education level: Not on file  Occupational History  . Not on file  Social Needs    . Financial resource strain: Not on file  . Food insecurity:    Worry: Not on file    Inability: Not on file  . Transportation needs:    Medical: Not on file    Non-medical: Not on file  Tobacco Use  . Smoking status: Never Smoker  . Smokeless tobacco: Never Used  Substance and Sexual Activity  . Alcohol use: No  . Drug use: No  . Sexual activity: Not on file  Lifestyle  . Physical activity:    Days per week: Not on file    Minutes per session: Not on file  . Stress: Not on file  Relationships  . Social connections:    Talks on phone: Not on file    Gets together: Not on file    Attends religious service: Not on file    Active member of club or organization: Not on file    Attends meetings of clubs or organizations: Not on file    Relationship status: Not on file  Other Topics Concern  . Not on file  Social History Narrative   Language barrier. Daughter participate in his therapy   Past Surgical History:  Procedure Laterality Date  . IR ANGIO INTRA EXTRACRAN SEL COM CAROTID INNOMINATE BILAT MOD SED  02/15/2018  . IR ANGIO VERTEBRAL SEL SUBCLAVIAN INNOMINATE UNI R MOD SED  02/15/2018  . IR ANGIO VERTEBRAL  SEL VERTEBRAL UNI L MOD SED  02/15/2018   Past Medical History:  Diagnosis Date  . Hypertension   . Stroke [facility](HCC)    "a long time ago -- 2 years ago"   BP (!) 131/57   Pulse 84   Ht 5\' 6"  (1.676 m)   SpO2 96%   BMI 19.57 kg/m   Opioid Risk Score:   Fall Risk Score:  `1  Depression screen PHQ 2/9  No flowsheet data found.   Review of Systems  Constitutional: Negative.   HENT: Negative.   Eyes: Negative.   Respiratory: Positive for cough.   Cardiovascular: Negative.   Gastrointestinal: Negative.   Endocrine: Negative.   Genitourinary: Negative.   Musculoskeletal: Positive for arthralgias, back pain and myalgias.  Skin: Negative.   Allergic/Immunologic: Negative.   Neurological: Positive for dizziness, weakness and numbness.  Hematological: Negative.    Psychiatric/Behavioral: Positive for confusion and dysphoric mood.  All other systems reviewed and are negative.      Objective:   Physical Exam  Constitutional: He is oriented to person, place, and time. He appears well-developed and well-nourished.  HENT:  Head: Normocephalic and atraumatic.  Neck: Normal range of motion. Neck supple.  Cardiovascular: Normal rate and regular rhythm.  Pulmonary/Chest: Effort normal and breath sounds normal.  Musculoskeletal:  Normal Muscle Bulk and Muscle Testing Reveals: Upper Extremities: Right: Paralysis: Passive ROM Performed Left: Full ROM and Muscle Strength 5/5 Lower Extremities: Right: Paralysis Noted Left: Full ROM and Muscle Strength 5/5 Arrived in wheelchair  Neurological: He is alert and oriented to person, place, and time.  Skin: Skin is warm and dry.  Psychiatric: He has a normal mood and affect. His behavior is normal.  Nursing note and vitals reviewed.         Assessment & Plan:  1. Acute Bilateral Watershed Infarction: Continue with Home Health Therapies with [facility]. 2. Right Hemiparesis: Continue Home Health Therapies. 3. Essential Hypertension: Continue Medication Regimen: PCP Following. 4. Expressive Aphasia: Continue With Speech Therapy .  60 minutes of face to face patient care time was spent during this visit. All questions were encouraged and answered.  F/U with Dr. Wynn BankerKirsteins in 4-6 weeks

## 2018-03-22 NOTE — Discharge Instructions (Signed)
Please use cream twice daily  Monitor bleeding. Please return if bleeding worsening or he is becoming increasingly weak  Follow up with gastroenterology

## 2018-03-22 NOTE — Telephone Encounter (Signed)
Multiple calls for FL-2 for Mr Reininger. It was faxed yesterday but they say it was not received.  I have faxed again (with confirmation) to [phone] and notified Cindy Sillmon.

## 2018-03-22 NOTE — ED Provider Notes (Signed)
[facility]    CSN: [id number] Arrival date & time: 03/22/18  1124     History   Chief Complaint Chief Complaint  Patient presents with  . Hemorrhoids    HPI Jorge Burns is a 77 y.o. male letter of hypertension, recent CVA with residual dysphagia and right-sided weakness, presenting today for evaluation of hemorrhoid.  Patient is nonverbal, his guardians he lives with have been taking care of him since his stroke.  They have noticed over the past week hemorrhoid in his rectal area.  Beginning yesterday he has had some bleeding.  Bleeding increased today.  Patient is taking aspirin and Plavix, started from recent stroke.  HPI  Past Medical History:  Diagnosis Date  . Hypertension   . Stroke [facility])    "a long time ago -- 2 years ago"    Patient Active Problem List   Diagnosis Date Noted  . Dysphagia, post-stroke   . Essential hypertension   . Acute bilat watershed infarction [facility]) 02/18/2018  . Right hemiparesis (HCC)   . Pulmonary fungal infection   . Diastolic dysfunction   . Prediabetes   . CVA (cerebral vascular accident) (HCC) 02/13/2018  . Language barrier   . Benign essential HTN     Past Surgical History:  Procedure Laterality Date  . IR ANGIO INTRA EXTRACRAN SEL COM CAROTID INNOMINATE BILAT MOD SED  02/15/2018  . IR ANGIO VERTEBRAL SEL SUBCLAVIAN INNOMINATE UNI R MOD SED  02/15/2018  . IR ANGIO VERTEBRAL SEL VERTEBRAL UNI L MOD SED  02/15/2018       Home Medications    Prior to Admission medications   Medication Sig Start Date End Date Taking? Authorizing Provider  aspirin 81 MG chewable tablet Chew 1 tablet (81 mg total) by mouth daily. 02/19/18   Leatha Gilding, MD  atorvastatin (LIPITOR) 80 MG tablet Take 1 tablet (80 mg total) by mouth daily at 6 PM. 03/11/18   Angiulli, Mcarthur Rossetti, PA-C  citalopram (CELEXA) 10 MG tablet Take 1 tablet (10 mg total) by mouth daily. 03/11/18   Angiulli, Mcarthur Rossetti, PA-C  clopidogrel (PLAVIX) 75 MG tablet Take 1  tablet (75 mg total) by mouth daily. 03/11/18   Angiulli, Mcarthur Rossetti, PA-C  hydrocortisone (ANUSOL-HC) 2.5 % rectal cream Apply rectally 2 times daily 03/22/18   Sanav Remer C, PA-C  lisinopril (PRINIVIL,ZESTRIL) 2.5 MG tablet Take 1 tablet (2.5 mg total) by mouth daily. 03/11/18   Angiulli, Mcarthur Rossetti, PA-C  methylphenidate (RITALIN) 10 MG tablet Take 1 tablet (10 mg total) by mouth 2 (two) times daily with breakfast and lunch. 03/11/18   Angiulli, Mcarthur Rossetti, PA-C    Family History History reviewed. No pertinent family history.  Social History Social History   Tobacco Use  . Smoking status: Never Smoker  . Smokeless tobacco: Never Used  Substance Use Topics  . Alcohol use: No  . Drug use: No     Allergies   Patient has no known allergies.   Review of Systems Review of Systems  Constitutional: Negative for activity change, appetite change, fatigue and fever.  HENT: Negative for congestion and trouble swallowing.   Respiratory: Negative for cough and shortness of breath.   Gastrointestinal: Positive for anal bleeding. Negative for abdominal pain, blood in stool, diarrhea, nausea and vomiting.  Genitourinary: Negative for decreased urine volume.  Skin: Negative for rash.  Neurological: Positive for weakness.     Physical Exam Triage Vital Signs ED Triage Vitals [03/22/18 1148]  Enc Vitals  Group     BP 122/60     Pulse Rate 72     Resp 18     Temp 99.2 F (37.3 C)     Temp Source Oral     SpO2 100 %     Weight      Height      Head Circumference      Peak Flow      Pain Score      Pain Loc      Pain Edu?      Excl. in GC?    No data found.  Updated Vital Signs BP 122/60 (BP Location: Left Arm)   Pulse 72   Temp 99.2 F (37.3 C) (Oral)   Resp 18   SpO2 100%   Visual Acuity Right Eye Distance:   Left Eye Distance:   Bilateral Distance:    Right Eye Near:   Left Eye Near:    Bilateral Near:     Physical Exam  Constitutional: He appears well-developed  and well-nourished.  HENT:  Head: Normocephalic and atraumatic.  Eyes: Conjunctivae are normal.  Neck: Neck supple.  Cardiovascular: Normal rate and regular rhythm.  No murmur heard. Pulmonary/Chest: Effort normal and breath sounds normal. No respiratory distress.  Abdominal: Soft. There is no tenderness.  Genitourinary:  Genitourinary Comments: Large hemorrhoid and rectum, nonreducible, does not appear to be actively bleeding.  Musculoskeletal: He exhibits no edema.  Neurological: He is alert.  Skin: Skin is warm and dry.  Psychiatric: He has a normal mood and affect.  Nursing note and vitals reviewed.    UC Treatments / Results  Labs (all labs ordered are listed, but only abnormal results are displayed) Labs Reviewed  POCT I-STAT, CHEM 8 - Abnormal; Notable for the following components:      Result Value   Glucose, Bld 103 (*)    All other components within normal limits    EKG None  Radiology No results found.  Procedures Procedures (including critical care time)  Medications Ordered in UC Medications - No data to display  Initial Impression / Assessment and Plan / UC Course  I have reviewed the triage vital signs and the nursing notes.  Pertinent labs & imaging results that were available during my care of the patient were reviewed by me and considered in my medical decision making (see chart for details).    Hemoglobin stable at 13.  Appears to have large external versus prolapsed internal hemorrhoid.  Will provide Anusol HC cream to apply twice daily for.  Continue to monitor.  Follow-up with gastroenterology.  Follow-up as scheduled next Thursday with new PCP.Discussed strict return precautions. Patient verbalized understanding and is agreeable with plan.   Final Clinical Impressions(s) / UC Diagnoses   Final diagnoses:  Hemorrhoid prolapse     Discharge Instructions     Please use cream twice daily  Monitor bleeding. Please return if bleeding  worsening or he is becoming increasingly weak  Follow up with gastroenterology   ED Prescriptions    Medication Sig Dispense Auth. Provider   hydrocortisone (ANUSOL-HC) 2.5 % rectal cream Apply rectally 2 times daily 28.35 g Tregan Read C, PA-C     Controlled Substance Prescriptions [place] Controlled Substance Registry consulted? Not Applicable   Lew DawesWieters, Adilenne Ashworth C, [place]PA-C 03/22/18 1332

## 2018-03-25 DIAGNOSIS — I69354 Hemiplegia and hemiparesis following cerebral infarction affecting left non-dominant side: Secondary | ICD-10-CM | POA: Diagnosis not present

## 2018-03-25 DIAGNOSIS — I10 Essential (primary) hypertension: Secondary | ICD-10-CM | POA: Diagnosis not present

## 2018-03-25 DIAGNOSIS — E785 Hyperlipidemia, unspecified: Secondary | ICD-10-CM | POA: Diagnosis not present

## 2018-03-25 DIAGNOSIS — D649 Anemia, unspecified: Secondary | ICD-10-CM | POA: Diagnosis not present

## 2018-03-25 DIAGNOSIS — I69391 Dysphagia following cerebral infarction: Secondary | ICD-10-CM | POA: Diagnosis not present

## 2018-03-25 DIAGNOSIS — Z7902 Long term (current) use of antithrombotics/antiplatelets: Secondary | ICD-10-CM | POA: Diagnosis not present

## 2018-03-30 DIAGNOSIS — D649 Anemia, unspecified: Secondary | ICD-10-CM | POA: Diagnosis not present

## 2018-03-30 DIAGNOSIS — I69354 Hemiplegia and hemiparesis following cerebral infarction affecting left non-dominant side: Secondary | ICD-10-CM | POA: Diagnosis not present

## 2018-03-30 DIAGNOSIS — Z7902 Long term (current) use of antithrombotics/antiplatelets: Secondary | ICD-10-CM | POA: Diagnosis not present

## 2018-03-30 DIAGNOSIS — I69391 Dysphagia following cerebral infarction: Secondary | ICD-10-CM | POA: Diagnosis not present

## 2018-03-30 DIAGNOSIS — E785 Hyperlipidemia, unspecified: Secondary | ICD-10-CM | POA: Diagnosis not present

## 2018-03-30 DIAGNOSIS — I10 Essential (primary) hypertension: Secondary | ICD-10-CM | POA: Diagnosis not present

## 2018-03-31 ENCOUNTER — Telehealth: Payer: Self-pay | Admitting: *Deleted

## 2018-03-31 ENCOUNTER — Ambulatory Visit: Payer: Medicare Other | Admitting: Family Medicine

## 2018-03-31 DIAGNOSIS — D649 Anemia, unspecified: Secondary | ICD-10-CM | POA: Diagnosis not present

## 2018-03-31 DIAGNOSIS — E785 Hyperlipidemia, unspecified: Secondary | ICD-10-CM | POA: Diagnosis not present

## 2018-03-31 DIAGNOSIS — Z7902 Long term (current) use of antithrombotics/antiplatelets: Secondary | ICD-10-CM | POA: Diagnosis not present

## 2018-03-31 DIAGNOSIS — I10 Essential (primary) hypertension: Secondary | ICD-10-CM | POA: Diagnosis not present

## 2018-03-31 DIAGNOSIS — I69354 Hemiplegia and hemiparesis following cerebral infarction affecting left non-dominant side: Secondary | ICD-10-CM | POA: Diagnosis not present

## 2018-03-31 DIAGNOSIS — I69391 Dysphagia following cerebral infarction: Secondary | ICD-10-CM | POA: Diagnosis not present

## 2018-03-31 NOTE — Telephone Encounter (Signed)
Jorge Burns, Jorge Burns, [name] for verbal [name] to address hemorrhoid discomfort and possible onset of pneumonia. Medical record reviewed Verbal orders given per office protocol.

## 2018-04-01 DIAGNOSIS — Z7902 Long term (current) use of antithrombotics/antiplatelets: Secondary | ICD-10-CM | POA: Diagnosis not present

## 2018-04-01 DIAGNOSIS — D649 Anemia, unspecified: Secondary | ICD-10-CM | POA: Diagnosis not present

## 2018-04-01 DIAGNOSIS — I10 Essential (primary) hypertension: Secondary | ICD-10-CM | POA: Diagnosis not present

## 2018-04-01 DIAGNOSIS — I69391 Dysphagia following cerebral infarction: Secondary | ICD-10-CM | POA: Diagnosis not present

## 2018-04-01 DIAGNOSIS — I69354 Hemiplegia and hemiparesis following cerebral infarction affecting left non-dominant side: Secondary | ICD-10-CM | POA: Diagnosis not present

## 2018-04-01 DIAGNOSIS — E785 Hyperlipidemia, unspecified: Secondary | ICD-10-CM | POA: Diagnosis not present

## 2018-04-05 DIAGNOSIS — E785 Hyperlipidemia, unspecified: Secondary | ICD-10-CM | POA: Diagnosis not present

## 2018-04-05 DIAGNOSIS — I10 Essential (primary) hypertension: Secondary | ICD-10-CM | POA: Diagnosis not present

## 2018-04-05 DIAGNOSIS — I69391 Dysphagia following cerebral infarction: Secondary | ICD-10-CM | POA: Diagnosis not present

## 2018-04-05 DIAGNOSIS — I69354 Hemiplegia and hemiparesis following cerebral infarction affecting left non-dominant side: Secondary | ICD-10-CM | POA: Diagnosis not present

## 2018-04-05 DIAGNOSIS — Z7902 Long term (current) use of antithrombotics/antiplatelets: Secondary | ICD-10-CM | POA: Diagnosis not present

## 2018-04-05 DIAGNOSIS — D649 Anemia, unspecified: Secondary | ICD-10-CM | POA: Diagnosis not present

## 2018-04-06 ENCOUNTER — Telehealth: Payer: Self-pay

## 2018-04-06 DIAGNOSIS — D649 Anemia, unspecified: Secondary | ICD-10-CM | POA: Diagnosis not present

## 2018-04-06 DIAGNOSIS — I10 Essential (primary) hypertension: Secondary | ICD-10-CM | POA: Diagnosis not present

## 2018-04-06 DIAGNOSIS — E785 Hyperlipidemia, unspecified: Secondary | ICD-10-CM | POA: Diagnosis not present

## 2018-04-06 DIAGNOSIS — Z7902 Long term (current) use of antithrombotics/antiplatelets: Secondary | ICD-10-CM | POA: Diagnosis not present

## 2018-04-06 DIAGNOSIS — I69354 Hemiplegia and hemiparesis following cerebral infarction affecting left non-dominant side: Secondary | ICD-10-CM | POA: Diagnosis not present

## 2018-04-06 DIAGNOSIS — I69391 Dysphagia following cerebral infarction: Secondary | ICD-10-CM | POA: Diagnosis not present

## 2018-04-06 NOTE — Telephone Encounter (Signed)
Jorge DerryAllison Burns, SP/[facility] called requesting verbal orders for 1 additional visit this month to f/u with swallowing and communication.

## 2018-04-07 ENCOUNTER — Other Ambulatory Visit: Payer: Self-pay

## 2018-04-07 ENCOUNTER — Emergency Department (HOSPITAL_COMMUNITY): Payer: Medicare Other

## 2018-04-07 ENCOUNTER — Emergency Department (HOSPITAL_COMMUNITY)
Admission: EM | Admit: 2018-04-07 | Discharge: 2018-04-08 | Disposition: A | Discharge status: Home / Self Care | Payer: Medicare Other | Attending: Emergency Medicine | Admitting: Emergency Medicine

## 2018-04-07 DIAGNOSIS — R1084 Generalized abdominal pain: Secondary | ICD-10-CM | POA: Diagnosis not present

## 2018-04-07 DIAGNOSIS — K5900 Constipation, unspecified: Secondary | ICD-10-CM | POA: Diagnosis present

## 2018-04-07 DIAGNOSIS — Z8673 Personal history of transient ischemic attack (TIA), and cerebral infarction without residual deficits: Secondary | ICD-10-CM | POA: Insufficient documentation

## 2018-04-07 DIAGNOSIS — Z7901 Long term (current) use of anticoagulants: Secondary | ICD-10-CM | POA: Diagnosis not present

## 2018-04-07 DIAGNOSIS — E876 Hypokalemia: Secondary | ICD-10-CM

## 2018-04-07 DIAGNOSIS — I10 Essential (primary) hypertension: Secondary | ICD-10-CM | POA: Insufficient documentation

## 2018-04-07 DIAGNOSIS — Z7982 Long term (current) use of aspirin: Secondary | ICD-10-CM | POA: Insufficient documentation

## 2018-04-07 DIAGNOSIS — R109 Unspecified abdominal pain: Secondary | ICD-10-CM | POA: Diagnosis not present

## 2018-04-07 DIAGNOSIS — Z79899 Other long term (current) drug therapy: Secondary | ICD-10-CM | POA: Diagnosis not present

## 2018-04-07 DIAGNOSIS — R05 Cough: Secondary | ICD-10-CM | POA: Diagnosis not present

## 2018-04-07 LAB — COMPREHENSIVE METABOLIC PANEL
ALT: 26 U/L (ref 0–44)
AST: 32 U/L (ref 15–41)
Albumin: 3.1 g/dL — ABNORMAL LOW (ref 3.5–5.0)
Alkaline Phosphatase: 89 U/L (ref 38–126)
Anion gap: 8 (ref 5–15)
BUN: 8 mg/dL (ref 8–23)
CO2: 27 mmol/L (ref 22–32)
Calcium: 8.8 mg/dL — ABNORMAL LOW (ref 8.9–10.3)
Chloride: 103 mmol/L (ref 98–111)
Creatinine, Ser: 1.09 mg/dL (ref 0.61–1.24)
GFR calc Af Amer: >60 mL/min (ref >60)
GFR calc non Af Amer: >60 mL/min (ref >60)
Glucose, Bld: 208 mg/dL — ABNORMAL HIGH (ref 70–99)
Potassium: 2.8 mmol/L — ABNORMAL LOW (ref 3.5–5.1)
Sodium: 138 mmol/L (ref 135–145)
Total Bilirubin: 1 mg/dL (ref 0.3–1.2)
Total Protein: 6.4 g/dL — ABNORMAL LOW (ref 6.5–8.1)

## 2018-04-07 LAB — CBC
HCT: 34.3 % — ABNORMAL LOW (ref 39.0–52.0)
Hemoglobin: 11.2 g/dL — ABNORMAL LOW (ref 13.0–17.0)
MCH: 25.1 pg — ABNORMAL LOW (ref 26.0–34.0)
MCHC: 32.7 g/dL (ref 30.0–36.0)
MCV: 76.9 fL — ABNORMAL LOW (ref 78.0–100.0)
Platelets: 311 10*3/uL (ref 150–400)
RBC: 4.46 MIL/uL (ref 4.22–5.81)
RDW: 15.3 % (ref 11.5–15.5)
WBC: 5.8 10*3/uL (ref 4.0–10.5)

## 2018-04-07 NOTE — ED Triage Notes (Addendum)
Patient speaks no [place], and is aphasic - adopted granddaughter to interpret. Family has noticed that pt has been grimacing and is concerned for constipation. Has also had a non-productive cough per family when he is eating/drinking. Family also believes that patient needs nursing home placement - he is too much for them to take care of.

## 2018-04-07 NOTE — Telephone Encounter (Signed)
[name] notified. 

## 2018-04-07 NOTE — ED Provider Notes (Signed)
Patient placed in Quick Look pathway, seen and evaluated   Chief Complaint: cough  HPI:   Obinna Loveall is a 77 y.o. male who presents to the ED with his adopted grand daughter. Patient does not speak English and is aphasic but granddaughter has been caring for patient and can give history.  Family has noticed that pt has been grimacing and is concerned for constipation. Has also had a non-productive cough per family when he is eating/drinking. Family also believes that patient needs nursing home placement - he is too much for them to take care of.  ROS: Resp: cough  GI; constipation  Physical Exam:  BP 135/64 (BP Location: Right Arm)   Pulse 72   Temp 98.6 F (37 C) (Oral)   Resp 16   Ht 5\' 6"  (1.676 m)   Wt 59 kg (130 lb)   SpO2 100%   BMI 20.98 kg/m    Gen: No distress  Neuro: Awake and Alert  Skin: Warm and dry  Lungs: rales left     Initiation of care has begun. The patient has been counseled on the process, plan, and necessity for staying for the completion/evaluation, and the remainder of the medical screening examination    Janne Napoleoneese, Burgess Sheriff M, NP 04/07/18 2008    Eber HongMiller, Brian, MD 04/12/18 1144

## 2018-04-07 NOTE — Telephone Encounter (Signed)
I have one additional speech visit

## 2018-04-08 ENCOUNTER — Emergency Department (HOSPITAL_COMMUNITY): Payer: Medicare Other

## 2018-04-08 DIAGNOSIS — R109 Unspecified abdominal pain: Secondary | ICD-10-CM | POA: Diagnosis not present

## 2018-04-08 LAB — URINALYSIS, ROUTINE W REFLEX MICROSCOPIC
Bacteria, UA: NONE SEEN
Bilirubin Urine: NEGATIVE
Glucose, UA: NEGATIVE mg/dL
Hgb urine dipstick: NEGATIVE
Ketones, ur: NEGATIVE mg/dL
Leukocytes, UA: NEGATIVE
Nitrite: NEGATIVE
Protein, ur: 30 mg/dL — AB
Specific Gravity, Urine: 1.018 (ref 1.005–1.030)
pH: 6 (ref 5.0–8.0)

## 2018-04-08 MED ORDER — POTASSIUM CHLORIDE 10 MEQ/100ML IV SOLN
10.0000 meq | INTRAVENOUS | Status: AC
  Administered 2018-04-08 (×3): 10 meq via INTRAVENOUS
  Filled 2018-04-08 (×3): qty 100

## 2018-04-08 MED ORDER — POTASSIUM CHLORIDE CRYS ER 20 MEQ PO TBCR: 20.0000 meq | EXTENDED_RELEASE_TABLET | Freq: Every day | ORAL | 0 refills | Status: DC

## 2018-04-08 MED ORDER — IOHEXOL 300 MG/ML  SOLN
100.0000 mL | Freq: Once | INTRAMUSCULAR | Status: AC | PRN
  Administered 2018-04-08: 100 mL via INTRAVENOUS

## 2018-04-08 NOTE — Progress Notes (Addendum)
8:2356am- CSW spoke with granddaughter and was informed that she would be able to pick pt up between 3:30-4pm today. Pt has been moved to F pod at this time. There are no further CSW needs. CSW will sign off.   8:29am-CSW spoke with home health social worker Aram BeechamCynthia and was informed that she has been working on long term placement for pt. Aram BeechamCynthia expressed that she has been working with [facility] on [facility] but ran into issues with the Medicaid worker regarding the Medicaid application. CSW was informed by Aram Beechamynthia that pt is UNABLE to be placed with Medicare part A and B as pt is out of the 30 day window. CSW spoke with granddaughter to give her update of this. CSW and CSW leadership actively working on a way to get pt back home as [facility] expressed that they can take pt but would leave pt's wheelchair here at the ED.   7:37am- CSW reached out to [place] Lakeyn[place] and left voicemail asking that she call CSW back.   CSW spoke with pt's granddaughter. Granddaughter expressed that pt has been living with her and her husband for over one year and a half. Granddaughter verbalized that pt is hard to care for as she has school Monday-Thursday. CSW spoke with granddaughter about placement and granddaughter expressed that [facility] Social Worker Aram BeechamCynthia has already been working on long term placement for pt. CSW did discuss the needs for long term placement including ensuring that Medicaid has been applied for. Granddaughter verbalized that she applied for Medicaid on March 23, 2018 and it is still pending at this time. CSW was given [facility] social workers number Aram BeechamCynthia [phone] and will follow up with her regarding placement.   Jorge Burns, MSW, LCSW-A Emergency Department Clinical Social Worker [phone]

## 2018-04-08 NOTE — ED Notes (Addendum)
Low Sodium Heart Healthy Diet, was ordered for Lunch.

## 2018-04-08 NOTE — ED Notes (Signed)
Pt to move to pod F to wait for granddaughter to pick up at 3:30-4pm

## 2018-04-08 NOTE — ED Notes (Signed)
Dinner order placed 

## 2018-04-08 NOTE — ED Notes (Signed)
RN attempted to use a translator to speak with patient, translator unable to find patient language. Patient lunch ordered.

## 2018-04-08 NOTE — ED Notes (Signed)
Patient eating

## 2018-04-08 NOTE — ED Notes (Signed)
Called about patient not receiving lunch advised sending up now

## 2018-04-08 NOTE — ED Provider Notes (Addendum)
[facility] EMERGENCY DEPARTMENT Provider Note   CSN: [id number] Arrival date & time: 04/07/18  1930     History   Chief Complaint Chief Complaint  Patient presents with  . Constipation    HPI Jorge Burns is a 77 y.o. male.  Patient brought to the emergency department by caregiver.  Patient had a stroke in May, was in the hospital in rehab for a month.  Family chose to take him home rather than go to a skilled nursing facility in June, but since then had a have had difficulty caring for him.  Over the last couple of days, family has noted that he has not been having bowel movements and he seems to be grunting and grimacing.  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 Level V Caveat due to aphasia.     Past Medical History:  Diagnosis Date  . Hypertension   . Stroke [facility])    "a long time ago -- 2 years ago"    Patient Active Problem List   Diagnosis Date Noted  . Dysphagia, post-stroke   . Essential hypertension   . 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 Right hemiparesis (HCC)   . Pulmonary fungal infection   . Diastolic dysfunction   . Prediabetes   . CVA (cerebral vascular accident) (HCC) 02/13/2018  . Language barrier   . Benign essential HTN     Past Surgical History:  Procedure Laterality Date  . IR ANGIO INTRA EXTRACRAN SEL COM CAROTID INNOMINATE BILAT MOD SED  02/15/2018  . IR ANGIO VERTEBRAL SEL SUBCLAVIAN INNOMINATE UNI R MOD SED  02/15/2018  . IR ANGIO VERTEBRAL SEL VERTEBRAL UNI L MOD SED  02/15/2018        Home Medications    Prior to Admission medications   Medication Sig Start Date End Date Taking? Authorizing Provider  aspirin 81 MG chewable tablet Chew 1 tablet (81 mg total) by mouth daily. 02/19/18   Leatha Gilding, MD  atorvastatin (LIPITOR) 80 MG tablet Take 1 tablet (80 mg total) by mouth daily at 6 PM. 03/11/18   Angiulli, Mcarthur Rossetti, PA-C  citalopram (CELEXA) 10 MG tablet Take 1  tablet (10 mg total) by mouth daily. 03/11/18   Angiulli, Mcarthur Rossetti, PA-C  clopidogrel (PLAVIX) 75 MG tablet Take 1 tablet (75 mg total) by mouth daily. 03/11/18   Angiulli, Mcarthur Rossetti, PA-C  hydrocortisone (ANUSOL-HC) 2.5 % rectal cream Apply rectally 2 times daily 03/22/18   Wieters, Hallie C, PA-C  lisinopril (PRINIVIL,ZESTRIL) 2.5 MG tablet Take 1 tablet (2.5 mg total) by mouth daily. 03/11/18   Angiulli, Mcarthur Rossetti, PA-C  methylphenidate (RITALIN) 10 MG tablet Take 1 tablet (10 mg total) by mouth 2 (two) times daily with breakfast and lunch. 03/11/18   Angiulli, Mcarthur Rossetti, PA-C    Family History No family history on file.  Social History Social History   Tobacco Use  . Smoking status: Never Smoker  . Smokeless tobacco: Never Used  Substance Use Topics  . Alcohol use: No  . Drug use: No     Allergies   Patient has no known allergies.   Review of Systems Review of Systems  Unable to perform ROS: Patient nonverbal     Physical Exam Updated Vital Signs BP (!) 159/84   Pulse 61   Temp 98.6 F (37 C) (Oral)   Resp 15   Ht 5\' 6"  (1.676 m)   Wt 59 kg (130  lb)   SpO2 98%   BMI 20.98 kg/m   Physical Exam  Constitutional: He appears well-developed and well-nourished. No distress.  HENT:  Head: Normocephalic and atraumatic.  Right Ear: Hearing normal.  Left Ear: Hearing normal.  Nose: Nose normal.  Mouth/Throat: Oropharynx is clear and moist and mucous membranes are normal.  Eyes: Pupils are equal, round, and reactive to light. Conjunctivae and EOM are normal.  Neck: Normal range of motion. Neck supple.  Cardiovascular: Regular rhythm, S1 normal and S2 normal. Exam reveals no gallop and no friction rub.  No murmur heard. Pulmonary/Chest: Effort normal and breath sounds normal. No respiratory distress. He exhibits no tenderness.  Abdominal: Soft. Normal appearance and bowel sounds are normal. There is no hepatosplenomegaly. There is tenderness. There is no rebound, no guarding,  no tenderness at McBurney's point and negative Murphy's sign. No hernia.  Genitourinary:  Genitourinary Comments: External hemorrhoid, no significant inflammation, no bleeding  Musculoskeletal: Normal range of motion.  Neurological: He is alert. He has normal strength. No cranial nerve deficit. GCS eye subscore is 4. GCS verbal subscore is 5. GCS motor subscore is 6.  Skin: Skin is warm, dry and intact. No rash noted. No cyanosis.  Psychiatric: He has a normal mood and affect. His speech is normal and behavior is normal. Thought content normal.  Nursing note and vitals reviewed.    ED Treatments / Results  Labs (all labs ordered are listed, but only abnormal results are displayed) Labs Reviewed  COMPREHENSIVE METABOLIC PANEL - Abnormal; Notable for the following components:      Result Value   Potassium 2.8 (*)    Glucose, Bld 208 (*)    Calcium 8.8 (*)    Total Protein 6.4 (*)    Albumin 3.1 (*)    All other components within normal limits  CBC - Abnormal; Notable for the following components:   Hemoglobin 11.2 (*)    HCT 34.3 (*)    MCV 76.9 (*)    MCH 25.1 (*)    All other components within normal limits  URINALYSIS, ROUTINE W REFLEX MICROSCOPIC - Abnormal; Notable for the following components:   Protein, ur 30 (*)    All other components within normal limits    EKG None  Radiology Dg Chest 2 View  Result Date: 04/07/2018 CLINICAL DATA:  Cough and possible aspiration. EXAM: CHEST - 2 VIEW COMPARISON:  None. FINDINGS: The lungs are well inflated. There is mild cardiomegaly. There is no focal airspace consolidation or pulmonary edema. There is no pleural effusion or pneumothorax. IMPRESSION: No active cardiopulmonary disease. Electronically Signed   By: Deatra Robinson M.D.   On: 04/07/2018 21:23   Dg Abdomen 1 View  Result Date: 04/07/2018 CLINICAL DATA:  Coughing after eating EXAM: ABDOMEN - 1 VIEW COMPARISON:  None. FINDINGS: The bowel gas pattern is normal. No  radio-opaque calculi or other significant radiographic abnormality are seen. IMPRESSION: Negative. Electronically Signed   By: Deatra Robinson M.D.   On: 04/07/2018 21:23   Ct Abdomen Pelvis W Contrast  Result Date: 04/08/2018 CLINICAL DATA:  77 year old male with abdominal pain. EXAM: CT ABDOMEN AND PELVIS WITH CONTRAST TECHNIQUE: Multidetector CT imaging of the abdomen and pelvis was performed using the standard protocol following bolus administration of intravenous contrast. CONTRAST:  OMNIPAQUE IOHEXOL 300 MG/ML  SOLN COMPARISON:  None. FINDINGS: Evaluation is limited due to respiratory motion artifact as well as artifact caused by patient's arms. Lower chest: Minimal bibasilar dependent atelectatic changes as  well as linear scarring. Left lung base calcified pleural plaques, likely sequela of prior asbestos exposure or related to prior empyema or hemothorax. No intra-abdominal free air.  Small free fluid within the pelvis. Hepatobiliary: No focal liver abnormality is seen. No gallstones, gallbladder wall thickening, or biliary dilatation. Pancreas: Unremarkable. No pancreatic ductal dilatation or surrounding inflammatory changes. Spleen: Normal in size without focal abnormality. Adrenals/Urinary Tract: The adrenal glands are unremarkable. Small bilateral renal hypodense lesions some of which demonstrate fluid attenuation consistent with cysts and others which are too small to characterize. There is no hydronephrosis on either side. There is symmetric enhancement and excretion of contrast by both kidneys. The urinary bladder is unremarkable. Stomach/Bowel: Evaluation of the bowel is limited in the absence of oral contrast. Mild diffuse thickened appearance of the wall of the stomach and small bowel loops with mild diffuse mesenteric edema. Although this may be related to underlying systemic process or generalized anasarca, a gastroenteritis is not excluded. Clinical correlation is recommended. There is  no bowel obstruction. The appendix is unremarkable as visualized. Vascular/Lymphatic: Moderate aortoiliac atherosclerotic disease. 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 The IVC is unremarkable as visualized. No portal venous gas. There is no adenopathy. Reproductive: The prostate and seminal vesicles are grossly unremarkable. Other: Mild diffuse subcutaneous edema no fluid collection. Musculoskeletal: Degenerative changes of the spine. No acute osseous pathology. 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 IMPRESSION: 1. Findings may represent mild gastroenteritis. Clinical correlation recommended. No bowel obstruction. Normal appendix. 2. Mild diffuse mesenteric edema and anasarca. 3.  Aortic Atherosclerosis (ICD10-I70.0). 4. 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 Electronically Signed   By: Elgie Collard M.D.   On: 04/08/2018 02:37    Procedures Procedures (including critical care time)  Medications Ordered in ED Medications  potassium chloride 10 mEq in 100 mL IVPB (10 mEq Intravenous New Bag/Given 04/08/18 0633)  iohexol (OMNIPAQUE) 300 MG/ML solution 100 mL (100 mLs Intravenous Contrast Given 04/08/18 0205)     Initial Impression / Assessment and Plan / ED Course  I have reviewed the triage vital signs and the nursing notes.  Pertinent labs & imaging results that were available during my care of the patient were reviewed by me and considered in my medical decision making (see chart for details).     Patient brought to the emergency department with concerns of possible abdominal pain by family.  Patient is difficult to evaluate because he is not [place] speaking and now is a phasic secondary to his stroke.  Family reports that he seems to be grimacing a lot and indicating that his abdomen hurts.  He has had some ongoing issues with constipation and an external hemorrhoid.  His work-up has been normal here.  Blood work unremarkable.  CT scan performed to further evaluate  for possible abdominal pathology.  No acute abnormality noted on CT.  Family also tell me that he likes to drink water while he is in bed.  When he is lying flat and tries to drink he coughs.  He appears to be breathing comfortably here, oxygen saturations are normal.  Chest x-ray is clear.  With his recent stroke, he might be having some difficulty with his swallowing, but this is presumably been worked up with his hospitalization.  Family has taken his water away from him at the bedside which should help with this problem.  Session with the patient's granddaughter and caregiver indicates that she seems to be struggling with caring for him.  He recently was discharged after a rehab stay.  At that time, family did not want to put him in a long-term skilled nursing facility, but at this point they realize they cannot care for him at home and would like to pursue this.  Social work has looked into the patient's case.  He has outpatient social work as well.  His Medicare would not allow for placement in a long-term facility at this time, he has been applied for Medicaid which does not come through.  He will need ongoing help with this and has a case Production designer, theatre/television/filmmanager and social worker already in place.    Patient noted to be hypokalemic here in the ER.  He was given IV potassium and will be prescribed oral replacement for outpatient.  Final Clinical Impressions(s) / ED Diagnoses   Final diagnoses:  Generalized abdominal pain    ED Discharge Orders    None       Margert Edsall, Canary Brimhristopher J, MD 04/08/18 [id number]    Gilda CreasePollina, Kyriana Yankee J, MD 04/08/18 [phone]

## 2018-04-08 NOTE — ED Notes (Signed)
Patient lunch tray is running late, [facility] Kitchen staff stated that they do have the order.

## 2018-04-08 NOTE — Progress Notes (Signed)
CSW received call from granddaughter expressing that she is on the way to get pt.  Jorge MangesKierra S. Hudsen Fei, MSW, LCSW-A Emergency Department Clinical Social Worker [phone]

## 2018-04-09 ENCOUNTER — Encounter (HOSPITAL_COMMUNITY): Payer: Self-pay | Admitting: *Deleted

## 2018-04-09 ENCOUNTER — Other Ambulatory Visit: Payer: Self-pay

## 2018-04-09 ENCOUNTER — Emergency Department (HOSPITAL_COMMUNITY): Payer: Medicare Other

## 2018-04-09 ENCOUNTER — Emergency Department (HOSPITAL_COMMUNITY)
Admission: EM | Admit: 2018-04-09 | Discharge: 2018-04-09 | Disposition: A | Discharge status: Home / Self Care | Payer: Medicare Other | Attending: Emergency Medicine | Admitting: Emergency Medicine

## 2018-04-09 DIAGNOSIS — Y929 Unspecified place or not applicable: Secondary | ICD-10-CM | POA: Diagnosis not present

## 2018-04-09 DIAGNOSIS — Y999 Unspecified external cause status: Secondary | ICD-10-CM | POA: Insufficient documentation

## 2018-04-09 DIAGNOSIS — Z79899 Other long term (current) drug therapy: Secondary | ICD-10-CM | POA: Diagnosis not present

## 2018-04-09 DIAGNOSIS — I10 Essential (primary) hypertension: Secondary | ICD-10-CM | POA: Diagnosis not present

## 2018-04-09 DIAGNOSIS — Z7982 Long term (current) use of aspirin: Secondary | ICD-10-CM | POA: Insufficient documentation

## 2018-04-09 DIAGNOSIS — W2209XA Striking against other stationary object, initial encounter: Secondary | ICD-10-CM | POA: Insufficient documentation

## 2018-04-09 DIAGNOSIS — Y939 Activity, unspecified: Secondary | ICD-10-CM | POA: Insufficient documentation

## 2018-04-09 DIAGNOSIS — S91112A Laceration without foreign body of left great toe without damage to nail, initial encounter: Secondary | ICD-10-CM | POA: Diagnosis not present

## 2018-04-09 DIAGNOSIS — S99921A Unspecified injury of right foot, initial encounter: Secondary | ICD-10-CM | POA: Diagnosis not present

## 2018-04-09 DIAGNOSIS — M79674 Pain in right toe(s): Secondary | ICD-10-CM | POA: Diagnosis not present

## 2018-04-09 LAB — URINALYSIS, ROUTINE W REFLEX MICROSCOPIC
Bilirubin Urine: NEGATIVE
Glucose, UA: NEGATIVE mg/dL
Hgb urine dipstick: NEGATIVE
Ketones, ur: NEGATIVE mg/dL
Leukocytes, UA: NEGATIVE
Nitrite: NEGATIVE
Protein, ur: NEGATIVE mg/dL
Specific Gravity, Urine: 1.005 (ref 1.005–1.030)
pH: 7 (ref 5.0–8.0)

## 2018-04-09 MED ORDER — TETANUS-DIPHTH-ACELL PERTUSSIS 5-2.5-18.5 LF-MCG/0.5 IM SUSP
0.5000 mL | Freq: Once | INTRAMUSCULAR | Status: AC
  Administered 2018-04-09: 0.5 mL via INTRAMUSCULAR
  Filled 2018-04-09: qty 0.5

## 2018-04-09 NOTE — ED Provider Notes (Signed)
[facility]ENCY [facility]PART[facility] Provider Note   CSN: [id number] Arrival date & time: 04/09/18  1514     History   Chief Complaint Chief Complaint  Patient presents with  . Toe Injury    HPI Delton Blank is a 77 y.o. male.  The history is provided by a relative. No language interpreter was used.  Toe Pain  This is a new problem. The current episode started 1 to 2 hours ago. The problem has not changed since onset.Nothing aggravates the symptoms. Nothing relieves the symptoms.   Pt tried to get up without assistance and hit his toe on wheelchair.  Pt has a cut on the side of his right toe  Pt seen here yesterday.   Past Medical History:  Diagnosis Date  . Hypertension   . Stroke [facility](HCC)    "a long time ago -- 2 years ago"    Patient Active Problem List   Diagnosis Date Noted  . Dysphagia, post-stroke   . Essential hypertension   . Acute bilat watershed infarction [facility](HCC) 02/18/2018  . Right hemiparesis (HCC)   . Pulmonary fungal infection   . Diastolic dysfunction   . Prediabetes   . CVA (cerebral vascular accident) (HCC) 02/13/2018  . Language barrier   . Benign essential HTN     Past Surgical History:  Procedure Laterality Date  . IR ANGIO INTRA EXTRACRAN SEL COM CAROTID INNOMINATE BILAT MOD SED  02/15/2018  . IR ANGIO VERTEBRAL SEL SUBCLAVIAN INNOMINATE UNI R MOD SED  02/15/2018  . IR ANGIO VERTEBRAL SEL VERTEBRAL UNI L MOD SED  02/15/2018        Home Medications    Prior to Admission medications   Medication Sig Start Date End Date Taking? Authorizing Provider  aspirin 81 MG chewable tablet Chew 1 tablet (81 mg total) by mouth daily. 02/19/18   Leatha GildingGherghe, Costin M, MD  atorvastatin (LIPITOR) 80 MG tablet Take 1 tablet (80 mg total) by mouth daily at 6 PM. 03/11/18   Angiulli, Mcarthur Rossettianiel J, PA-C  citalopram (CELEXA) 10 MG tablet Take 1 tablet (10 mg total) by mouth daily. 03/11/18   Angiulli, Mcarthur Rossettianiel J, PA-C  clopidogrel (PLAVIX) 75 MG tablet Take 1 tablet  (75 mg total) by mouth daily. 03/11/18   Angiulli, Mcarthur Rossettianiel J, PA-C  hydrocortisone (ANUSOL-HC) 2.5 % rectal cream Apply rectally 2 times daily 03/22/18   Wieters, Hallie C, PA-C  lisinopril (PRINIVIL,ZESTRIL) 2.5 MG tablet Take 1 tablet (2.5 mg total) by mouth daily. 03/11/18   Angiulli, Mcarthur Rossettianiel J, PA-C  methylphenidate (RITALIN) 10 MG tablet Take 1 tablet (10 mg total) by mouth 2 (two) times daily with breakfast and lunch. 03/11/18   Angiulli, Mcarthur Rossettianiel J, PA-C  potassium chloride SA (K-DUR,KLOR-CON) 20 MEQ tablet Take 1 tablet (20 mEq total) by mouth daily. 04/08/18   Gilda CreasePollina, Christopher J, MD    Family History No family history on file.  Social History Social History   Tobacco Use  . Smoking status: Never Smoker  . Smokeless tobacco: Never Used  Substance Use Topics  . Alcohol use: No  . Drug use: No     Allergies   Patient has no known allergies.   Review of Systems Review of Systems  Skin: Positive for wound.  All other systems reviewed and are negative.    Physical Exam Updated Vital Signs BP (!) 160/82   Pulse 63   Temp 97.7 F (36.5 C) (Oral)   Resp 15   Ht 5\' 6"  (1.676 m)  Wt 59 kg (130 lb)   SpO2 96%   BMI 20.98 kg/m   Physical Exam  Constitutional: He appears well-developed and well-nourished.  HENT:  Head: Normocephalic and atraumatic.  Musculoskeletal: He exhibits tenderness.  1.5 cm abrasion toe,  nv and ns intact   Neurological: He is alert.  Skin: Skin is warm.  Psychiatric: He has a normal mood and affect.  Nursing note and vitals reviewed.    ED Treatments / Results  Labs (all labs ordered are listed, but only abnormal results are displayed) Labs Reviewed  URINALYSIS, ROUTINE W REFLEX MICROSCOPIC    EKG None  Radiology Dg Chest 2 View  Result Date: 04/07/2018 CLINICAL DATA:  Cough and possible aspiration. EXAM: CHEST - 2 VIEW COMPARISON:  None. FINDINGS: The lungs are well inflated. There is mild cardiomegaly. There is no focal airspace  consolidation or pulmonary edema. There is no pleural effusion or pneumothorax. IMPRESSION: No active cardiopulmonary disease. Electronically Signed   By: Deatra Robinson M.D.   On: 04/07/2018 21:23   Dg Abdomen 1 View  Result Date: 04/07/2018 CLINICAL DATA:  Coughing after eating EXAM: ABDOMEN - 1 VIEW COMPARISON:  None. FINDINGS: The bowel gas pattern is normal. No radio-opaque calculi or other significant radiographic abnormality are seen. IMPRESSION: Negative. Electronically Signed   By: Deatra Robinson M.D.   On: 04/07/2018 21:23   Ct Abdomen Pelvis W Contrast  Result Date: 04/08/2018 CLINICAL DATA:  77 year old male with abdominal pain. EXAM: CT ABDOMEN AND PELVIS WITH CONTRAST TECHNIQUE: Multidetector CT imaging of the abdomen and pelvis was performed using the standard protocol following bolus administration of intravenous contrast. CONTRAST:  OMNIPAQUE IOHEXOL 300 MG/ML  SOLN COMPARISON:  None. FINDINGS: Evaluation is limited due to respiratory motion artifact as well as artifact caused by patient's arms. Lower chest: Minimal bibasilar dependent atelectatic changes as well as linear scarring. Left lung base calcified pleural plaques, likely sequela of prior asbestos exposure or related to prior empyema or hemothorax. No intra-abdominal free air.  Small free fluid within the pelvis. Hepatobiliary: No focal liver abnormality is seen. No gallstones, gallbladder wall thickening, or biliary dilatation. Pancreas: Unremarkable. No pancreatic ductal dilatation or surrounding inflammatory changes. Spleen: Normal in size without focal abnormality. Adrenals/Urinary Tract: The adrenal glands are unremarkable. Small bilateral renal hypodense lesions some of which demonstrate fluid attenuation consistent with cysts and others which are too small to characterize. There is no hydronephrosis on either side. There is symmetric enhancement and excretion of contrast by both kidneys. The urinary bladder is  unremarkable. Stomach/Bowel: Evaluation of the bowel is limited in the absence of oral contrast. Mild diffuse thickened appearance of the wall of the stomach and small bowel loops with mild diffuse mesenteric edema. Although this may be related to underlying systemic process or generalized anasarca, a gastroenteritis is not excluded. Clinical correlation is recommended. There is no bowel obstruction. The appendix is unremarkable as visualized. Vascular/Lymphatic: Moderate aortoiliac atherosclerotic disease. 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 The IVC is unremarkable as visualized. No portal venous gas. There is no adenopathy. Reproductive: The prostate and seminal vesicles are grossly unremarkable. Other: Mild diffuse subcutaneous edema no fluid collection. Musculoskeletal: Degenerative changes of the spine. No acute osseous pathology. 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 IMPRESSION: 1. Findings may represent mild gastroenteritis. Clinical correlation recommended. No bowel obstruction. Normal appendix. 2. Mild diffuse mesenteric edema and anasarca. 3.  Aortic Atherosclerosis (ICD10-I70.0). 4. 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 Electronically Signed  By: Elgie Collard M.D.   On: 04/08/2018 02:37   Dg Toe Great Right  Result Date: 04/09/2018 CLINICAL DATA:  Fall in bathroom today. Great toe pain. Initial encounter. EXAM: RIGHT GREAT TOE COMPARISON:  None. FINDINGS: There is no evidence of acute fracture or dislocation. Mild osteoarthritis is seen involving the interphalangeal joint. No other bone lesions identified. Mild soft tissue swelling along medial aspect of the interphalangeal joint. IMPRESSION: No evidence of acute fracture or dislocation. Mild interphalangeal joint osteoarthritis. Electronically Signed   By: Myles Rosenthal M.D.   On: 04/09/2018 16:09    Procedures Procedures (including critical care time)  Medications Ordered in ED Medications - No data to  display   Initial Impression / Assessment and Plan / ED Course  I have reviewed the triage vital signs and the nursing notes.  Pertinent labs & imaging results that were available during my care of the patient were reviewed by me and considered in my medical decision making (see chart for details).     Wound care  Final Clinical Impressions(s) / ED Diagnoses   Final diagnoses:  Laceration of left great toe without foreign body present, nail damage status unspecified, initial encounter    ED Discharge Orders    None    An After Visit Summary was printed and given to the patient.    Elson Areas, [place] 04/09/18 1833    Arby Barrette, MD 04/09/18 [phone]

## 2018-04-09 NOTE — ED Notes (Signed)
Patient transported to X-ray 

## 2018-04-09 NOTE — ED Triage Notes (Signed)
The pt is c/o painful rt great toe  He fell from his w/c earlier today  He had a styroke several months  He has weakness in his rt side and he is non-verbal.  He was attempting to get from his w/c to the toilet

## 2018-04-11 ENCOUNTER — Ambulatory Visit: Payer: Medicare Other | Admitting: Family Medicine

## 2018-04-12 ENCOUNTER — Telehealth: Payer: Self-pay

## 2018-04-12 ENCOUNTER — Other Ambulatory Visit: Payer: Self-pay | Admitting: *Deleted

## 2018-04-12 DIAGNOSIS — E7800 Pure hypercholesterolemia, unspecified: Secondary | ICD-10-CM

## 2018-04-12 MED ORDER — ATORVASTATIN CALCIUM 80 MG PO TABS: 80.0000 mg | ORAL_TABLET | Freq: Every day | ORAL | 0 refills | Status: DC

## 2018-04-12 NOTE — Telephone Encounter (Signed)
Ladonna SnideShonda, PCM/ADVHC called requesting verbal orders for nursing care. After calling and speaking with Ladonna SnideShonda to tell her that a verbal order was given on 03/31/18 for a RN consult. She states the consult was done on 04/06/18 and the nurse has gone on vacation and wanted at least one visit approved to continue until nurse returns with how long to continue. Verbal order given.

## 2018-04-14 ENCOUNTER — Telehealth: Payer: Self-pay

## 2018-04-14 DIAGNOSIS — I69391 Dysphagia following cerebral infarction: Secondary | ICD-10-CM | POA: Diagnosis not present

## 2018-04-14 DIAGNOSIS — D649 Anemia, unspecified: Secondary | ICD-10-CM | POA: Diagnosis not present

## 2018-04-14 DIAGNOSIS — E785 Hyperlipidemia, unspecified: Secondary | ICD-10-CM | POA: Diagnosis not present

## 2018-04-14 DIAGNOSIS — I69354 Hemiplegia and hemiparesis following cerebral infarction affecting left non-dominant side: Secondary | ICD-10-CM | POA: Diagnosis not present

## 2018-04-14 DIAGNOSIS — I10 Essential (primary) hypertension: Secondary | ICD-10-CM | POA: Diagnosis not present

## 2018-04-14 DIAGNOSIS — Z7902 Long term (current) use of antithrombotics/antiplatelets: Secondary | ICD-10-CM | POA: Diagnosis not present

## 2018-04-14 NOTE — Telephone Encounter (Signed)
Mary,PT/ADVHC called stating pt fell last night attempting to get out of bed and the family has made he sit without getting up without help. He has developed RUE palpitations and increased swelling in right hand. She [facility] for a RN to come out today.

## 2018-04-14 NOTE — Telephone Encounter (Signed)
Please ask RN to call if she thinks pt needs xray

## 2018-04-15 DIAGNOSIS — Z7902 Long term (current) use of antithrombotics/antiplatelets: Secondary | ICD-10-CM | POA: Diagnosis not present

## 2018-04-15 DIAGNOSIS — E785 Hyperlipidemia, unspecified: Secondary | ICD-10-CM | POA: Diagnosis not present

## 2018-04-15 DIAGNOSIS — D649 Anemia, unspecified: Secondary | ICD-10-CM | POA: Diagnosis not present

## 2018-04-15 DIAGNOSIS — I10 Essential (primary) hypertension: Secondary | ICD-10-CM | POA: Diagnosis not present

## 2018-04-15 DIAGNOSIS — I69354 Hemiplegia and hemiparesis following cerebral infarction affecting left non-dominant side: Secondary | ICD-10-CM | POA: Diagnosis not present

## 2018-04-15 DIAGNOSIS — I69391 Dysphagia following cerebral infarction: Secondary | ICD-10-CM | POA: Diagnosis not present

## 2018-04-15 NOTE — Telephone Encounter (Addendum)
[facility], but mailbox is full and not accepting messages. [facility] and gave information. They took information and said to [facility] and [facility]--I tried her this morning and mailbox is still full.

## 2018-04-18 ENCOUNTER — Telehealth: Payer: Self-pay | Admitting: *Deleted

## 2018-04-18 DIAGNOSIS — I69391 Dysphagia following cerebral infarction: Secondary | ICD-10-CM | POA: Diagnosis not present

## 2018-04-18 DIAGNOSIS — I10 Essential (primary) hypertension: Secondary | ICD-10-CM | POA: Diagnosis not present

## 2018-04-18 DIAGNOSIS — I69354 Hemiplegia and hemiparesis following cerebral infarction affecting left non-dominant side: Secondary | ICD-10-CM | POA: Diagnosis not present

## 2018-04-18 DIAGNOSIS — E785 Hyperlipidemia, unspecified: Secondary | ICD-10-CM | POA: Diagnosis not present

## 2018-04-18 DIAGNOSIS — Z7902 Long term (current) use of antithrombotics/antiplatelets: Secondary | ICD-10-CM | POA: Diagnosis not present

## 2018-04-18 DIAGNOSIS — D649 Anemia, unspecified: Secondary | ICD-10-CM | POA: Diagnosis not present

## 2018-04-18 NOTE — Telephone Encounter (Signed)
Cindy Still, Social Worker, [facility] states that an updated FL2 was faxed over to our office. The existing FL2 is going to expire. They need the updated FL2 filled out and signed ASAP. She states that the are so close to finding placement for patient.  Please follow up to make sure this is being done.

## 2018-04-18 NOTE — Telephone Encounter (Signed)
Nelly LaurenceKathryn Fleihan, OT, [facility] left a message [facility] for [facility].  Medical record reviewed. Verbal orders given per office protocol via secured voicemail

## 2018-04-19 DIAGNOSIS — I69354 Hemiplegia and hemiparesis following cerebral infarction affecting left non-dominant side: Secondary | ICD-10-CM | POA: Diagnosis not present

## 2018-04-19 DIAGNOSIS — E785 Hyperlipidemia, unspecified: Secondary | ICD-10-CM | POA: Diagnosis not present

## 2018-04-19 DIAGNOSIS — D649 Anemia, unspecified: Secondary | ICD-10-CM | POA: Diagnosis not present

## 2018-04-19 DIAGNOSIS — I10 Essential (primary) hypertension: Secondary | ICD-10-CM | POA: Diagnosis not present

## 2018-04-19 DIAGNOSIS — Z7902 Long term (current) use of antithrombotics/antiplatelets: Secondary | ICD-10-CM | POA: Diagnosis not present

## 2018-04-19 DIAGNOSIS — I69391 Dysphagia following cerebral infarction: Secondary | ICD-10-CM | POA: Diagnosis not present

## 2018-04-20 NOTE — Telephone Encounter (Signed)
2nd call, urgent request to get updated FL2

## 2018-04-21 DIAGNOSIS — R2689 Other abnormalities of gait and mobility: Secondary | ICD-10-CM | POA: Diagnosis not present

## 2018-04-21 DIAGNOSIS — I10 Essential (primary) hypertension: Secondary | ICD-10-CM | POA: Diagnosis not present

## 2018-04-21 DIAGNOSIS — D649 Anemia, unspecified: Secondary | ICD-10-CM | POA: Diagnosis not present

## 2018-04-21 DIAGNOSIS — R488 Other symbolic dysfunctions: Secondary | ICD-10-CM | POA: Diagnosis not present

## 2018-04-21 DIAGNOSIS — R1312 Dysphagia, oropharyngeal phase: Secondary | ICD-10-CM | POA: Diagnosis not present

## 2018-04-21 DIAGNOSIS — R1310 Dysphagia, unspecified: Secondary | ICD-10-CM | POA: Diagnosis not present

## 2018-04-21 DIAGNOSIS — M6281 Muscle weakness (generalized): Secondary | ICD-10-CM | POA: Diagnosis not present

## 2018-04-22 DIAGNOSIS — R2689 Other abnormalities of gait and mobility: Secondary | ICD-10-CM | POA: Diagnosis not present

## 2018-04-22 DIAGNOSIS — D649 Anemia, unspecified: Secondary | ICD-10-CM | POA: Diagnosis not present

## 2018-04-22 DIAGNOSIS — R488 Other symbolic dysfunctions: Secondary | ICD-10-CM | POA: Diagnosis not present

## 2018-04-22 DIAGNOSIS — M6281 Muscle weakness (generalized): Secondary | ICD-10-CM | POA: Diagnosis not present

## 2018-04-22 DIAGNOSIS — I10 Essential (primary) hypertension: Secondary | ICD-10-CM | POA: Diagnosis not present

## 2018-04-22 DIAGNOSIS — R1310 Dysphagia, unspecified: Secondary | ICD-10-CM | POA: Diagnosis not present

## 2018-04-25 DIAGNOSIS — M6281 Muscle weakness (generalized): Secondary | ICD-10-CM | POA: Diagnosis not present

## 2018-04-25 DIAGNOSIS — R1310 Dysphagia, unspecified: Secondary | ICD-10-CM | POA: Diagnosis not present

## 2018-04-25 DIAGNOSIS — D649 Anemia, unspecified: Secondary | ICD-10-CM | POA: Diagnosis not present

## 2018-04-25 DIAGNOSIS — I10 Essential (primary) hypertension: Secondary | ICD-10-CM | POA: Diagnosis not present

## 2018-04-25 DIAGNOSIS — R488 Other symbolic dysfunctions: Secondary | ICD-10-CM | POA: Diagnosis not present

## 2018-04-25 DIAGNOSIS — R2689 Other abnormalities of gait and mobility: Secondary | ICD-10-CM | POA: Diagnosis not present

## 2018-04-26 DIAGNOSIS — M6281 Muscle weakness (generalized): Secondary | ICD-10-CM | POA: Diagnosis not present

## 2018-04-26 DIAGNOSIS — I10 Essential (primary) hypertension: Secondary | ICD-10-CM | POA: Diagnosis not present

## 2018-04-26 DIAGNOSIS — R488 Other symbolic dysfunctions: Secondary | ICD-10-CM | POA: Diagnosis not present

## 2018-04-26 DIAGNOSIS — R2689 Other abnormalities of gait and mobility: Secondary | ICD-10-CM | POA: Diagnosis not present

## 2018-04-26 DIAGNOSIS — R1310 Dysphagia, unspecified: Secondary | ICD-10-CM | POA: Diagnosis not present

## 2018-04-26 DIAGNOSIS — D649 Anemia, unspecified: Secondary | ICD-10-CM | POA: Diagnosis not present

## 2018-04-27 DIAGNOSIS — R1310 Dysphagia, unspecified: Secondary | ICD-10-CM | POA: Diagnosis not present

## 2018-04-27 DIAGNOSIS — R2689 Other abnormalities of gait and mobility: Secondary | ICD-10-CM | POA: Diagnosis not present

## 2018-04-27 DIAGNOSIS — M6281 Muscle weakness (generalized): Secondary | ICD-10-CM | POA: Diagnosis not present

## 2018-04-27 DIAGNOSIS — R488 Other symbolic dysfunctions: Secondary | ICD-10-CM | POA: Diagnosis not present

## 2018-04-27 DIAGNOSIS — D649 Anemia, unspecified: Secondary | ICD-10-CM | POA: Diagnosis not present

## 2018-04-27 DIAGNOSIS — I10 Essential (primary) hypertension: Secondary | ICD-10-CM | POA: Diagnosis not present

## 2018-04-28 ENCOUNTER — Ambulatory Visit: Payer: Medicare Other | Admitting: Adult Health

## 2018-04-28 DIAGNOSIS — R1310 Dysphagia, unspecified: Secondary | ICD-10-CM | POA: Diagnosis not present

## 2018-04-28 DIAGNOSIS — I639 Cerebral infarction, unspecified: Secondary | ICD-10-CM | POA: Diagnosis not present

## 2018-04-28 DIAGNOSIS — R488 Other symbolic dysfunctions: Secondary | ICD-10-CM | POA: Diagnosis not present

## 2018-04-28 DIAGNOSIS — I10 Essential (primary) hypertension: Secondary | ICD-10-CM | POA: Diagnosis not present

## 2018-04-28 DIAGNOSIS — M6281 Muscle weakness (generalized): Secondary | ICD-10-CM | POA: Diagnosis not present

## 2018-04-28 DIAGNOSIS — R5381 Other malaise: Secondary | ICD-10-CM | POA: Diagnosis not present

## 2018-04-28 DIAGNOSIS — D649 Anemia, unspecified: Secondary | ICD-10-CM | POA: Diagnosis not present

## 2018-04-28 DIAGNOSIS — R2689 Other abnormalities of gait and mobility: Secondary | ICD-10-CM | POA: Diagnosis not present

## 2018-04-28 NOTE — Progress Notes (Deleted)
[facility] [address] [place] [address] [phone]       OFFICE FOLLOW UP NOTE  [name] Date of Birth:  [birth year] Medical Record Number:  [id number]   Reason for Referral:  hospital stroke follow up  CHIEF COMPLAINT:  No chief complaint on file.   HPI: Jorge Burns is being seen today for initial visit in the office for multifocal acute ischemia within the left hemisphere due to severe left carotid stenosis on 02/13/2018. History obtained from *** and chart review. Reviewed all radiology images and labs personally.  Jorge Burns is a 77 y.o. male with history of hypertension and a previous stroke presenting with right sided weakness and AMS. He did not receive IV t-PA due to late presentation.  CT head was negative for acute abnormality but did show an old right parietal infarct.  MRI head showed multifocal acute ischemia within the left hemisphere.  MRA head unremarkable.  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  CTA of neck showed critical left ICA stenosis.  Cerebral angiogram showed 95% left ICA stenosis plus left ICA proximal stenosis with slow antegrade flow.  Approximately 35% stenosis of right ICA proximal with 5 mm ulcerated plaque.  2D echo showed an EF of 50 to 55%.  Carotid Doppler studies on 02/17/2018 showed left ICA stenosis of 80 to 99%.  LDL 151 and his patient was on statin PTA recommended start Lipitor 40 mg daily.  HTN stable during admission and long-term goal normotensive.  A1c satisfactory at 5.7.  On 02/16/2018, patient neurologically worsened with increased aphasia and dense hemiplegia and it was suspected this was due to his left carotid being occluded.  at that time there is no intervention available due to dense neurological deficits but if patient showed significant neurological improvement they may consider left carotid revascularization in the subacute phase between 1 to 2 months.   Recommended for DAPT with aspirin and Plavix at discharge.  Recommended BP goal 1 30-1 50 and avoid hypotension.  Possible surgical intervention with ICA revascularization in 1 to 2 weeks.  Patient was discharged to CIR in stable condition.    ROS:   14 system review of systems performed and negative with exception of ***  PMH:  Past Medical History:  Diagnosis Date  . Hypertension   . Stroke [facility])    "a long time ago -- 2 years ago"    PSH:  Past Surgical History:  Procedure Laterality Date  . IR ANGIO INTRA EXTRACRAN SEL COM CAROTID INNOMINATE BILAT MOD SED  02/15/2018  . IR ANGIO VERTEBRAL SEL SUBCLAVIAN INNOMINATE UNI R MOD SED  02/15/2018  . IR ANGIO VERTEBRAL SEL VERTEBRAL UNI L MOD SED  02/15/2018    Social History:  Social History   Socioeconomic History  . Marital status: Single    Spouse name: Not on file  . Number of children: Not on file  . Years of education: Not on file  . Highest education level: Not on file  Occupational History  . Not on file  Social Needs  . Financial resource strain: Not on file  . Food insecurity:    Worry: Not on file    Inability: Not on file  . Transportation needs:    Medical: Not on file    Non-medical: Not on file  Tobacco Use  . Smoking status: Never Smoker  . Smokeless tobacco: Never Used  Substance and Sexual  Activity  . Alcohol use: No  . Drug use: No  . Sexual activity: Not on file  Lifestyle  . Physical activity:    Days per week: Not on file    Minutes per session: Not on file  . Stress: Not on file  Relationships  . Social connections:    Talks on phone: Not on file    Gets together: Not on file    Attends religious service: Not on file    Active member of club or organization: Not on file    Attends meetings of clubs or organizations: Not on file    Relationship status: Not on file  . Intimate partner violence:    Fear of current or ex partner: Not on file    Emotionally abused: Not on file    Physically  abused: Not on file    Forced sexual activity: Not on file  Other Topics Concern  . Not on file  Social History Narrative   Language barrier. Daughter participate in his therapy    Family History: No family history on file.  Medications:   Current Outpatient Medications on File Prior to Visit  Medication Sig Dispense Refill  . aspirin 81 MG chewable tablet Chew 1 tablet (81 mg total) by mouth daily.    Marland Kitchen. atorvastatin (LIPITOR) 80 MG tablet Take 1 tablet (80 mg total) by mouth daily at 6 PM. 30 tablet 0  . citalopram (CELEXA) 10 MG tablet Take 1 tablet (10 mg total) by mouth daily. 30 tablet 1  . clopidogrel (PLAVIX) 75 MG tablet Take 1 tablet (75 mg total) by mouth daily. 30 tablet 1  . hydrocortisone (ANUSOL-HC) 2.5 % rectal cream Apply rectally 2 times daily 28.35 g 0  . lisinopril (PRINIVIL,ZESTRIL) 2.5 MG tablet Take 1 tablet (2.5 mg total) by mouth daily. 30 tablet 1  . methylphenidate (RITALIN) 10 MG tablet Take 1 tablet (10 mg total) by mouth 2 (two) times daily with breakfast and lunch. 60 tablet 0  . potassium chloride SA (K-DUR,KLOR-CON) 20 MEQ tablet Take 1 tablet (20 mEq total) by mouth daily. 7 tablet 0   No current facility-administered medications on file prior to visit.     Allergies:  No Known Allergies   Physical Exam  There were no vitals filed for this visit. There is no height or weight on file to calculate BMI. No exam data present  General: well developed, well nourished, seated, in no evident distress Head: head normocephalic and atraumatic.   Neck: supple with no carotid or supraclavicular bruits Cardiovascular: regular rate and rhythm, no murmurs Musculoskeletal: no deformity Skin:  no rash/petichiae Vascular:  Normal pulses all extremities  Neurologic Exam Mental Status: Awake and fully alert. Oriented to place and time. Recent and remote memory intact. Attention span, concentration and fund of knowledge appropriate. Mood and affect appropriate.    No flowsheet data found. Cranial Nerves: Fundoscopic exam reveals sharp disc margins. Pupils equal, briskly reactive to light. Extraocular movements full without nystagmus. Visual fields full to confrontation. Hearing intact. Facial sensation intact. Face, tongue, palate moves normally and symmetrically.  Motor: Normal bulk and tone. Normal strength in all tested extremity muscles. Sensory.: intact to touch , pinprick , position and vibratory sensation.  Coordination: Rapid alternating movements normal in all extremities. Finger-to-nose and heel-to-shin performed accurately bilaterally. Gait and Station: Arises from chair without difficulty. Stance is normal. Gait demonstrates normal stride length and balance . Able to heel, toe and tandem walk without difficulty.  Reflexes: 1+ and symmetric. Toes downgoing.    NIHSS  *** Modified Rankin  ***   Diagnostic Data (Labs, Imaging, Testing)  Ct Head Wo Contrast 02/12/2018 IMPRESSION:  No acute intracranial abnormalities. Chronic atrophy and small vessel ischemic changes. Old right parietal infarct.    Ct Head Wo Contrast 02/11/2018 IMPRESSION:  1. No definite CT evidence for acute intracranial abnormality.  2. Atrophy and right temporal lobe encephalomalacia.   Mr Brain 92 Contrast Mr Angiogram Head Wo Contrast Mr Angiogram Neck W Or Wo Contrast 02/13/2018 IMPRESSION:  1. Multifocal acute ischemia within the left hemisphere, predominantly within the deep watershed zone and left ACA/MCA cortical watershed zone. No hemorrhage or mass effect.  2. Small subacute infarct within the right-sided white matter adjacent to the corpus callosum splenium.  3. No intracranial occlusion or high-grade stenosis.  4. Loss of enhancement of the proximal left internal carotid artery over a short segment with near-immediate reconstitution. This indicates either critical stenosis or occlusion.  5. 4 x 3 mm rounded focus projecting posteriorly from the  proximal right internal carotid artery, most consistent with a small aneurysm. This might be artifactual secondary to the presence of atherosclerotic plaque at the bifurcation. If clinically warranted, this could be further characterized with CTA of the neck.   Cerebral catheter angiogram 02/15/18 : . 1.95 % plus LT ICA prox stenosis with slow antegrade flow. 2.Approx 35 % stenosis of RT ICA prox with a 5mm smoth ulcerated plaque 3.approx 40 % stenosis of LT VA origin. 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 Wall thickness was normal. Systolic function was normal. The estimated ejection fraction was in the range of 50% to 55%. Wall motion was normal;    ASSESSMENT: Jorge Burns is a 77 y.o. year old male here with multifocal acute ischemia within the left hemisphere on 02/13/2018 secondary to severe left carotid stenosis. Vascular risk factors include HTN and HLD.     PLAN: -Continue {anticoagulants:31417}  and ***  for secondary stroke prevention -F/u with PCP regarding your *** management -continue to monitor BP at home  -Maintain strict control of hypertension with blood pressure goal below 130/90, diabetes with hemoglobin A1c goal below 6.5% and cholesterol with LDL cholesterol (bad cholesterol) goal below 70 mg/dL. I also advised the patient to eat a healthy diet with plenty of whole grains, cereals, fruits and vegetables, exercise regularly and maintain ideal body weight.  Follow up in *** or call earlier if needed   Greater than 50% of time during this 25 minute visit was spent on counseling,explanation of diagnosis of ***, reviewing risk factor management of ***, planning of further management, discussion with patient and family and coordination of care    George Hugh, [facility]  [facility] [address] [place], [place] [address]  Phone  [phone] Fax [phone]

## 2018-04-29 ENCOUNTER — Encounter: Payer: Self-pay | Admitting: Adult Health

## 2018-04-29 DIAGNOSIS — R488 Other symbolic dysfunctions: Secondary | ICD-10-CM | POA: Diagnosis not present

## 2018-04-29 DIAGNOSIS — I10 Essential (primary) hypertension: Secondary | ICD-10-CM | POA: Diagnosis not present

## 2018-04-29 DIAGNOSIS — R2689 Other abnormalities of gait and mobility: Secondary | ICD-10-CM | POA: Diagnosis not present

## 2018-04-29 DIAGNOSIS — M6281 Muscle weakness (generalized): Secondary | ICD-10-CM | POA: Diagnosis not present

## 2018-04-29 DIAGNOSIS — D649 Anemia, unspecified: Secondary | ICD-10-CM | POA: Diagnosis not present

## 2018-04-29 DIAGNOSIS — R1310 Dysphagia, unspecified: Secondary | ICD-10-CM | POA: Diagnosis not present

## 2018-05-01 DIAGNOSIS — I10 Essential (primary) hypertension: Secondary | ICD-10-CM | POA: Diagnosis not present

## 2018-05-01 DIAGNOSIS — R1310 Dysphagia, unspecified: Secondary | ICD-10-CM | POA: Diagnosis not present

## 2018-05-01 DIAGNOSIS — R2689 Other abnormalities of gait and mobility: Secondary | ICD-10-CM | POA: Diagnosis not present

## 2018-05-01 DIAGNOSIS — D649 Anemia, unspecified: Secondary | ICD-10-CM | POA: Diagnosis not present

## 2018-05-01 DIAGNOSIS — R488 Other symbolic dysfunctions: Secondary | ICD-10-CM | POA: Diagnosis not present

## 2018-05-01 DIAGNOSIS — M6281 Muscle weakness (generalized): Secondary | ICD-10-CM | POA: Diagnosis not present

## 2018-05-02 DIAGNOSIS — D649 Anemia, unspecified: Secondary | ICD-10-CM | POA: Diagnosis not present

## 2018-05-02 DIAGNOSIS — R2689 Other abnormalities of gait and mobility: Secondary | ICD-10-CM | POA: Diagnosis not present

## 2018-05-02 DIAGNOSIS — R5381 Other malaise: Secondary | ICD-10-CM | POA: Diagnosis not present

## 2018-05-02 DIAGNOSIS — M6281 Muscle weakness (generalized): Secondary | ICD-10-CM | POA: Diagnosis not present

## 2018-05-02 DIAGNOSIS — R1310 Dysphagia, unspecified: Secondary | ICD-10-CM | POA: Diagnosis not present

## 2018-05-02 DIAGNOSIS — I639 Cerebral infarction, unspecified: Secondary | ICD-10-CM | POA: Diagnosis not present

## 2018-05-02 DIAGNOSIS — R488 Other symbolic dysfunctions: Secondary | ICD-10-CM | POA: Diagnosis not present

## 2018-05-02 DIAGNOSIS — I10 Essential (primary) hypertension: Secondary | ICD-10-CM | POA: Diagnosis not present

## 2018-05-03 ENCOUNTER — Encounter: Payer: Medicare Other | Attending: Registered Nurse

## 2018-05-03 ENCOUNTER — Ambulatory Visit: Payer: Self-pay | Admitting: Physical Medicine & Rehabilitation

## 2018-05-03 DIAGNOSIS — Z8673 Personal history of transient ischemic attack (TIA), and cerebral infarction without residual deficits: Secondary | ICD-10-CM | POA: Insufficient documentation

## 2018-05-03 DIAGNOSIS — Z09 Encounter for follow-up examination after completed treatment for conditions other than malignant neoplasm: Secondary | ICD-10-CM | POA: Insufficient documentation

## 2018-05-03 DIAGNOSIS — I6389 Other cerebral infarction: Secondary | ICD-10-CM | POA: Insufficient documentation

## 2018-05-03 DIAGNOSIS — R488 Other symbolic dysfunctions: Secondary | ICD-10-CM | POA: Diagnosis not present

## 2018-05-03 DIAGNOSIS — G8191 Hemiplegia, unspecified affecting right dominant side: Secondary | ICD-10-CM | POA: Insufficient documentation

## 2018-05-03 DIAGNOSIS — I10 Essential (primary) hypertension: Secondary | ICD-10-CM | POA: Insufficient documentation

## 2018-05-03 DIAGNOSIS — D649 Anemia, unspecified: Secondary | ICD-10-CM | POA: Diagnosis not present

## 2018-05-03 DIAGNOSIS — Z8249 Family history of ischemic heart disease and other diseases of the circulatory system: Secondary | ICD-10-CM | POA: Insufficient documentation

## 2018-05-03 DIAGNOSIS — Z79899 Other long term (current) drug therapy: Secondary | ICD-10-CM | POA: Diagnosis not present

## 2018-05-03 DIAGNOSIS — R4701 Aphasia: Secondary | ICD-10-CM | POA: Insufficient documentation

## 2018-05-03 DIAGNOSIS — Z9889 Other specified postprocedural states: Secondary | ICD-10-CM | POA: Insufficient documentation

## 2018-05-03 DIAGNOSIS — R2689 Other abnormalities of gait and mobility: Secondary | ICD-10-CM | POA: Diagnosis not present

## 2018-05-03 DIAGNOSIS — R1310 Dysphagia, unspecified: Secondary | ICD-10-CM | POA: Diagnosis not present

## 2018-05-03 DIAGNOSIS — E039 Hypothyroidism, unspecified: Secondary | ICD-10-CM | POA: Diagnosis not present

## 2018-05-03 DIAGNOSIS — M6281 Muscle weakness (generalized): Secondary | ICD-10-CM | POA: Diagnosis not present

## 2018-05-04 DIAGNOSIS — R2689 Other abnormalities of gait and mobility: Secondary | ICD-10-CM | POA: Diagnosis not present

## 2018-05-04 DIAGNOSIS — D649 Anemia, unspecified: Secondary | ICD-10-CM | POA: Diagnosis not present

## 2018-05-04 DIAGNOSIS — R488 Other symbolic dysfunctions: Secondary | ICD-10-CM | POA: Diagnosis not present

## 2018-05-04 DIAGNOSIS — I10 Essential (primary) hypertension: Secondary | ICD-10-CM | POA: Diagnosis not present

## 2018-05-04 DIAGNOSIS — R1310 Dysphagia, unspecified: Secondary | ICD-10-CM | POA: Diagnosis not present

## 2018-05-04 DIAGNOSIS — M6281 Muscle weakness (generalized): Secondary | ICD-10-CM | POA: Diagnosis not present

## 2018-05-05 DIAGNOSIS — R2689 Other abnormalities of gait and mobility: Secondary | ICD-10-CM | POA: Diagnosis not present

## 2018-05-05 DIAGNOSIS — I10 Essential (primary) hypertension: Secondary | ICD-10-CM | POA: Diagnosis not present

## 2018-05-05 DIAGNOSIS — D649 Anemia, unspecified: Secondary | ICD-10-CM | POA: Diagnosis not present

## 2018-05-05 DIAGNOSIS — M6281 Muscle weakness (generalized): Secondary | ICD-10-CM | POA: Diagnosis not present

## 2018-05-05 DIAGNOSIS — R488 Other symbolic dysfunctions: Secondary | ICD-10-CM | POA: Diagnosis not present

## 2018-05-05 DIAGNOSIS — R1310 Dysphagia, unspecified: Secondary | ICD-10-CM | POA: Diagnosis not present

## 2018-05-06 DIAGNOSIS — I10 Essential (primary) hypertension: Secondary | ICD-10-CM | POA: Diagnosis not present

## 2018-05-06 DIAGNOSIS — R1310 Dysphagia, unspecified: Secondary | ICD-10-CM | POA: Diagnosis not present

## 2018-05-06 DIAGNOSIS — R2689 Other abnormalities of gait and mobility: Secondary | ICD-10-CM | POA: Diagnosis not present

## 2018-05-06 DIAGNOSIS — M6281 Muscle weakness (generalized): Secondary | ICD-10-CM | POA: Diagnosis not present

## 2018-05-06 DIAGNOSIS — D649 Anemia, unspecified: Secondary | ICD-10-CM | POA: Diagnosis not present

## 2018-05-06 DIAGNOSIS — R488 Other symbolic dysfunctions: Secondary | ICD-10-CM | POA: Diagnosis not present

## 2018-05-08 DIAGNOSIS — R1310 Dysphagia, unspecified: Secondary | ICD-10-CM | POA: Diagnosis not present

## 2018-05-08 DIAGNOSIS — M6281 Muscle weakness (generalized): Secondary | ICD-10-CM | POA: Diagnosis not present

## 2018-05-08 DIAGNOSIS — I10 Essential (primary) hypertension: Secondary | ICD-10-CM | POA: Diagnosis not present

## 2018-05-08 DIAGNOSIS — R488 Other symbolic dysfunctions: Secondary | ICD-10-CM | POA: Diagnosis not present

## 2018-05-08 DIAGNOSIS — D649 Anemia, unspecified: Secondary | ICD-10-CM | POA: Diagnosis not present

## 2018-05-08 DIAGNOSIS — R2689 Other abnormalities of gait and mobility: Secondary | ICD-10-CM | POA: Diagnosis not present

## 2018-05-09 DIAGNOSIS — M6281 Muscle weakness (generalized): Secondary | ICD-10-CM | POA: Diagnosis not present

## 2018-05-09 DIAGNOSIS — R488 Other symbolic dysfunctions: Secondary | ICD-10-CM | POA: Diagnosis not present

## 2018-05-09 DIAGNOSIS — I10 Essential (primary) hypertension: Secondary | ICD-10-CM | POA: Diagnosis not present

## 2018-05-09 DIAGNOSIS — R2689 Other abnormalities of gait and mobility: Secondary | ICD-10-CM | POA: Diagnosis not present

## 2018-05-09 DIAGNOSIS — R1310 Dysphagia, unspecified: Secondary | ICD-10-CM | POA: Diagnosis not present

## 2018-05-09 DIAGNOSIS — D649 Anemia, unspecified: Secondary | ICD-10-CM | POA: Diagnosis not present

## 2018-05-10 DIAGNOSIS — R2689 Other abnormalities of gait and mobility: Secondary | ICD-10-CM | POA: Diagnosis not present

## 2018-05-10 DIAGNOSIS — I10 Essential (primary) hypertension: Secondary | ICD-10-CM | POA: Diagnosis not present

## 2018-05-10 DIAGNOSIS — D649 Anemia, unspecified: Secondary | ICD-10-CM | POA: Diagnosis not present

## 2018-05-10 DIAGNOSIS — R488 Other symbolic dysfunctions: Secondary | ICD-10-CM | POA: Diagnosis not present

## 2018-05-10 DIAGNOSIS — R1310 Dysphagia, unspecified: Secondary | ICD-10-CM | POA: Diagnosis not present

## 2018-05-10 DIAGNOSIS — M6281 Muscle weakness (generalized): Secondary | ICD-10-CM | POA: Diagnosis not present

## 2018-05-11 DIAGNOSIS — M6281 Muscle weakness (generalized): Secondary | ICD-10-CM | POA: Diagnosis not present

## 2018-05-11 DIAGNOSIS — D649 Anemia, unspecified: Secondary | ICD-10-CM | POA: Diagnosis not present

## 2018-05-11 DIAGNOSIS — R488 Other symbolic dysfunctions: Secondary | ICD-10-CM | POA: Diagnosis not present

## 2018-05-11 DIAGNOSIS — R2689 Other abnormalities of gait and mobility: Secondary | ICD-10-CM | POA: Diagnosis not present

## 2018-05-11 DIAGNOSIS — R1310 Dysphagia, unspecified: Secondary | ICD-10-CM | POA: Diagnosis not present

## 2018-05-11 DIAGNOSIS — I10 Essential (primary) hypertension: Secondary | ICD-10-CM | POA: Diagnosis not present

## 2018-05-12 DIAGNOSIS — R1310 Dysphagia, unspecified: Secondary | ICD-10-CM | POA: Diagnosis not present

## 2018-05-12 DIAGNOSIS — D649 Anemia, unspecified: Secondary | ICD-10-CM | POA: Diagnosis not present

## 2018-05-12 DIAGNOSIS — R2689 Other abnormalities of gait and mobility: Secondary | ICD-10-CM | POA: Diagnosis not present

## 2018-05-12 DIAGNOSIS — I10 Essential (primary) hypertension: Secondary | ICD-10-CM | POA: Diagnosis not present

## 2018-05-12 DIAGNOSIS — R488 Other symbolic dysfunctions: Secondary | ICD-10-CM | POA: Diagnosis not present

## 2018-05-12 DIAGNOSIS — M6281 Muscle weakness (generalized): Secondary | ICD-10-CM | POA: Diagnosis not present

## 2018-05-13 DIAGNOSIS — D649 Anemia, unspecified: Secondary | ICD-10-CM | POA: Diagnosis not present

## 2018-05-13 DIAGNOSIS — R2689 Other abnormalities of gait and mobility: Secondary | ICD-10-CM | POA: Diagnosis not present

## 2018-05-13 DIAGNOSIS — R488 Other symbolic dysfunctions: Secondary | ICD-10-CM | POA: Diagnosis not present

## 2018-05-13 DIAGNOSIS — I10 Essential (primary) hypertension: Secondary | ICD-10-CM | POA: Diagnosis not present

## 2018-05-13 DIAGNOSIS — M6281 Muscle weakness (generalized): Secondary | ICD-10-CM | POA: Diagnosis not present

## 2018-05-13 DIAGNOSIS — R1310 Dysphagia, unspecified: Secondary | ICD-10-CM | POA: Diagnosis not present

## 2018-05-15 DIAGNOSIS — M6281 Muscle weakness (generalized): Secondary | ICD-10-CM | POA: Diagnosis not present

## 2018-05-15 DIAGNOSIS — R488 Other symbolic dysfunctions: Secondary | ICD-10-CM | POA: Diagnosis not present

## 2018-05-15 DIAGNOSIS — D649 Anemia, unspecified: Secondary | ICD-10-CM | POA: Diagnosis not present

## 2018-05-15 DIAGNOSIS — R2689 Other abnormalities of gait and mobility: Secondary | ICD-10-CM | POA: Diagnosis not present

## 2018-05-15 DIAGNOSIS — I10 Essential (primary) hypertension: Secondary | ICD-10-CM | POA: Diagnosis not present

## 2018-05-15 DIAGNOSIS — R1310 Dysphagia, unspecified: Secondary | ICD-10-CM | POA: Diagnosis not present

## 2018-05-16 DIAGNOSIS — R2689 Other abnormalities of gait and mobility: Secondary | ICD-10-CM | POA: Diagnosis not present

## 2018-05-16 DIAGNOSIS — D649 Anemia, unspecified: Secondary | ICD-10-CM | POA: Diagnosis not present

## 2018-05-16 DIAGNOSIS — I10 Essential (primary) hypertension: Secondary | ICD-10-CM | POA: Diagnosis not present

## 2018-05-16 DIAGNOSIS — R488 Other symbolic dysfunctions: Secondary | ICD-10-CM | POA: Diagnosis not present

## 2018-05-16 DIAGNOSIS — M6281 Muscle weakness (generalized): Secondary | ICD-10-CM | POA: Diagnosis not present

## 2018-05-16 DIAGNOSIS — R1310 Dysphagia, unspecified: Secondary | ICD-10-CM | POA: Diagnosis not present

## 2018-05-17 DIAGNOSIS — D649 Anemia, unspecified: Secondary | ICD-10-CM | POA: Diagnosis not present

## 2018-05-17 DIAGNOSIS — R488 Other symbolic dysfunctions: Secondary | ICD-10-CM | POA: Diagnosis not present

## 2018-05-17 DIAGNOSIS — R1310 Dysphagia, unspecified: Secondary | ICD-10-CM | POA: Diagnosis not present

## 2018-05-17 DIAGNOSIS — I10 Essential (primary) hypertension: Secondary | ICD-10-CM | POA: Diagnosis not present

## 2018-05-17 DIAGNOSIS — M6281 Muscle weakness (generalized): Secondary | ICD-10-CM | POA: Diagnosis not present

## 2018-05-17 DIAGNOSIS — R2689 Other abnormalities of gait and mobility: Secondary | ICD-10-CM | POA: Diagnosis not present

## 2018-05-18 DIAGNOSIS — M6281 Muscle weakness (generalized): Secondary | ICD-10-CM | POA: Diagnosis not present

## 2018-05-18 DIAGNOSIS — R1310 Dysphagia, unspecified: Secondary | ICD-10-CM | POA: Diagnosis not present

## 2018-05-18 DIAGNOSIS — R5381 Other malaise: Secondary | ICD-10-CM | POA: Diagnosis not present

## 2018-05-18 DIAGNOSIS — R2689 Other abnormalities of gait and mobility: Secondary | ICD-10-CM | POA: Diagnosis not present

## 2018-05-18 DIAGNOSIS — D649 Anemia, unspecified: Secondary | ICD-10-CM | POA: Diagnosis not present

## 2018-05-18 DIAGNOSIS — I639 Cerebral infarction, unspecified: Secondary | ICD-10-CM | POA: Diagnosis not present

## 2018-05-18 DIAGNOSIS — I10 Essential (primary) hypertension: Secondary | ICD-10-CM | POA: Diagnosis not present

## 2018-05-18 DIAGNOSIS — R488 Other symbolic dysfunctions: Secondary | ICD-10-CM | POA: Diagnosis not present

## 2018-05-19 DIAGNOSIS — R2689 Other abnormalities of gait and mobility: Secondary | ICD-10-CM | POA: Diagnosis not present

## 2018-05-19 DIAGNOSIS — R488 Other symbolic dysfunctions: Secondary | ICD-10-CM | POA: Diagnosis not present

## 2018-05-19 DIAGNOSIS — R1310 Dysphagia, unspecified: Secondary | ICD-10-CM | POA: Diagnosis not present

## 2018-05-19 DIAGNOSIS — M6281 Muscle weakness (generalized): Secondary | ICD-10-CM | POA: Diagnosis not present

## 2018-05-19 DIAGNOSIS — I10 Essential (primary) hypertension: Secondary | ICD-10-CM | POA: Diagnosis not present

## 2018-05-19 DIAGNOSIS — D649 Anemia, unspecified: Secondary | ICD-10-CM | POA: Diagnosis not present

## 2018-05-20 DIAGNOSIS — I10 Essential (primary) hypertension: Secondary | ICD-10-CM | POA: Diagnosis not present

## 2018-05-20 DIAGNOSIS — M6281 Muscle weakness (generalized): Secondary | ICD-10-CM | POA: Diagnosis not present

## 2018-05-20 DIAGNOSIS — R1310 Dysphagia, unspecified: Secondary | ICD-10-CM | POA: Diagnosis not present

## 2018-05-20 DIAGNOSIS — D649 Anemia, unspecified: Secondary | ICD-10-CM | POA: Diagnosis not present

## 2018-05-20 DIAGNOSIS — R2689 Other abnormalities of gait and mobility: Secondary | ICD-10-CM | POA: Diagnosis not present

## 2018-05-20 DIAGNOSIS — R488 Other symbolic dysfunctions: Secondary | ICD-10-CM | POA: Diagnosis not present

## 2018-05-22 DIAGNOSIS — I10 Essential (primary) hypertension: Secondary | ICD-10-CM | POA: Diagnosis not present

## 2018-05-22 DIAGNOSIS — R488 Other symbolic dysfunctions: Secondary | ICD-10-CM | POA: Diagnosis not present

## 2018-05-22 DIAGNOSIS — D649 Anemia, unspecified: Secondary | ICD-10-CM | POA: Diagnosis not present

## 2018-05-22 DIAGNOSIS — R1310 Dysphagia, unspecified: Secondary | ICD-10-CM | POA: Diagnosis not present

## 2018-05-22 DIAGNOSIS — R1312 Dysphagia, oropharyngeal phase: Secondary | ICD-10-CM | POA: Diagnosis not present

## 2018-05-22 DIAGNOSIS — M6281 Muscle weakness (generalized): Secondary | ICD-10-CM | POA: Diagnosis not present

## 2018-05-22 DIAGNOSIS — R2689 Other abnormalities of gait and mobility: Secondary | ICD-10-CM | POA: Diagnosis not present

## 2018-05-23 DIAGNOSIS — M6281 Muscle weakness (generalized): Secondary | ICD-10-CM | POA: Diagnosis not present

## 2018-05-23 DIAGNOSIS — R488 Other symbolic dysfunctions: Secondary | ICD-10-CM | POA: Diagnosis not present

## 2018-05-23 DIAGNOSIS — R2689 Other abnormalities of gait and mobility: Secondary | ICD-10-CM | POA: Diagnosis not present

## 2018-05-23 DIAGNOSIS — I10 Essential (primary) hypertension: Secondary | ICD-10-CM | POA: Diagnosis not present

## 2018-05-23 DIAGNOSIS — R1310 Dysphagia, unspecified: Secondary | ICD-10-CM | POA: Diagnosis not present

## 2018-05-23 DIAGNOSIS — D649 Anemia, unspecified: Secondary | ICD-10-CM | POA: Diagnosis not present

## 2018-05-24 DIAGNOSIS — I639 Cerebral infarction, unspecified: Secondary | ICD-10-CM | POA: Diagnosis not present

## 2018-05-24 DIAGNOSIS — R2689 Other abnormalities of gait and mobility: Secondary | ICD-10-CM | POA: Diagnosis not present

## 2018-05-24 DIAGNOSIS — R1310 Dysphagia, unspecified: Secondary | ICD-10-CM | POA: Diagnosis not present

## 2018-05-24 DIAGNOSIS — I10 Essential (primary) hypertension: Secondary | ICD-10-CM | POA: Diagnosis not present

## 2018-05-24 DIAGNOSIS — M6281 Muscle weakness (generalized): Secondary | ICD-10-CM | POA: Diagnosis not present

## 2018-05-24 DIAGNOSIS — R488 Other symbolic dysfunctions: Secondary | ICD-10-CM | POA: Diagnosis not present

## 2018-05-24 DIAGNOSIS — F419 Anxiety disorder, unspecified: Secondary | ICD-10-CM | POA: Diagnosis not present

## 2018-05-24 DIAGNOSIS — D649 Anemia, unspecified: Secondary | ICD-10-CM | POA: Diagnosis not present

## 2018-05-24 DIAGNOSIS — F3341 Major depressive disorder, recurrent, in partial remission: Secondary | ICD-10-CM | POA: Diagnosis not present

## 2018-05-25 DIAGNOSIS — R2689 Other abnormalities of gait and mobility: Secondary | ICD-10-CM | POA: Diagnosis not present

## 2018-05-25 DIAGNOSIS — I10 Essential (primary) hypertension: Secondary | ICD-10-CM | POA: Diagnosis not present

## 2018-05-25 DIAGNOSIS — R488 Other symbolic dysfunctions: Secondary | ICD-10-CM | POA: Diagnosis not present

## 2018-05-25 DIAGNOSIS — I639 Cerebral infarction, unspecified: Secondary | ICD-10-CM | POA: Diagnosis not present

## 2018-05-25 DIAGNOSIS — M6281 Muscle weakness (generalized): Secondary | ICD-10-CM | POA: Diagnosis not present

## 2018-05-25 DIAGNOSIS — R5381 Other malaise: Secondary | ICD-10-CM | POA: Diagnosis not present

## 2018-05-25 DIAGNOSIS — D649 Anemia, unspecified: Secondary | ICD-10-CM | POA: Diagnosis not present

## 2018-05-25 DIAGNOSIS — F3341 Major depressive disorder, recurrent, in partial remission: Secondary | ICD-10-CM | POA: Diagnosis not present

## 2018-05-25 DIAGNOSIS — R1310 Dysphagia, unspecified: Secondary | ICD-10-CM | POA: Diagnosis not present

## 2018-05-26 DIAGNOSIS — R2689 Other abnormalities of gait and mobility: Secondary | ICD-10-CM | POA: Diagnosis not present

## 2018-05-26 DIAGNOSIS — D649 Anemia, unspecified: Secondary | ICD-10-CM | POA: Diagnosis not present

## 2018-05-26 DIAGNOSIS — R488 Other symbolic dysfunctions: Secondary | ICD-10-CM | POA: Diagnosis not present

## 2018-05-26 DIAGNOSIS — R1310 Dysphagia, unspecified: Secondary | ICD-10-CM | POA: Diagnosis not present

## 2018-05-26 DIAGNOSIS — M6281 Muscle weakness (generalized): Secondary | ICD-10-CM | POA: Diagnosis not present

## 2018-05-26 DIAGNOSIS — I10 Essential (primary) hypertension: Secondary | ICD-10-CM | POA: Diagnosis not present

## 2018-05-27 DIAGNOSIS — M6281 Muscle weakness (generalized): Secondary | ICD-10-CM | POA: Diagnosis not present

## 2018-05-27 DIAGNOSIS — D649 Anemia, unspecified: Secondary | ICD-10-CM | POA: Diagnosis not present

## 2018-05-27 DIAGNOSIS — R2689 Other abnormalities of gait and mobility: Secondary | ICD-10-CM | POA: Diagnosis not present

## 2018-05-27 DIAGNOSIS — R1310 Dysphagia, unspecified: Secondary | ICD-10-CM | POA: Diagnosis not present

## 2018-05-27 DIAGNOSIS — R488 Other symbolic dysfunctions: Secondary | ICD-10-CM | POA: Diagnosis not present

## 2018-05-27 DIAGNOSIS — I10 Essential (primary) hypertension: Secondary | ICD-10-CM | POA: Diagnosis not present

## 2018-05-29 DIAGNOSIS — D649 Anemia, unspecified: Secondary | ICD-10-CM | POA: Diagnosis not present

## 2018-05-29 DIAGNOSIS — R488 Other symbolic dysfunctions: Secondary | ICD-10-CM | POA: Diagnosis not present

## 2018-05-29 DIAGNOSIS — R1310 Dysphagia, unspecified: Secondary | ICD-10-CM | POA: Diagnosis not present

## 2018-05-29 DIAGNOSIS — I10 Essential (primary) hypertension: Secondary | ICD-10-CM | POA: Diagnosis not present

## 2018-05-29 DIAGNOSIS — M6281 Muscle weakness (generalized): Secondary | ICD-10-CM | POA: Diagnosis not present

## 2018-05-29 DIAGNOSIS — R2689 Other abnormalities of gait and mobility: Secondary | ICD-10-CM | POA: Diagnosis not present

## 2018-05-30 DIAGNOSIS — R2689 Other abnormalities of gait and mobility: Secondary | ICD-10-CM | POA: Diagnosis not present

## 2018-05-30 DIAGNOSIS — D649 Anemia, unspecified: Secondary | ICD-10-CM | POA: Diagnosis not present

## 2018-05-30 DIAGNOSIS — M6281 Muscle weakness (generalized): Secondary | ICD-10-CM | POA: Diagnosis not present

## 2018-05-30 DIAGNOSIS — R488 Other symbolic dysfunctions: Secondary | ICD-10-CM | POA: Diagnosis not present

## 2018-05-30 DIAGNOSIS — F3341 Major depressive disorder, recurrent, in partial remission: Secondary | ICD-10-CM | POA: Diagnosis not present

## 2018-05-30 DIAGNOSIS — R1310 Dysphagia, unspecified: Secondary | ICD-10-CM | POA: Diagnosis not present

## 2018-05-30 DIAGNOSIS — I10 Essential (primary) hypertension: Secondary | ICD-10-CM | POA: Diagnosis not present

## 2018-05-30 DIAGNOSIS — I639 Cerebral infarction, unspecified: Secondary | ICD-10-CM | POA: Diagnosis not present

## 2018-05-31 DIAGNOSIS — D649 Anemia, unspecified: Secondary | ICD-10-CM | POA: Diagnosis not present

## 2018-05-31 DIAGNOSIS — M6281 Muscle weakness (generalized): Secondary | ICD-10-CM | POA: Diagnosis not present

## 2018-05-31 DIAGNOSIS — R488 Other symbolic dysfunctions: Secondary | ICD-10-CM | POA: Diagnosis not present

## 2018-05-31 DIAGNOSIS — R2689 Other abnormalities of gait and mobility: Secondary | ICD-10-CM | POA: Diagnosis not present

## 2018-05-31 DIAGNOSIS — R1310 Dysphagia, unspecified: Secondary | ICD-10-CM | POA: Diagnosis not present

## 2018-05-31 DIAGNOSIS — I10 Essential (primary) hypertension: Secondary | ICD-10-CM | POA: Diagnosis not present

## 2018-06-01 DIAGNOSIS — R1310 Dysphagia, unspecified: Secondary | ICD-10-CM | POA: Diagnosis not present

## 2018-06-01 DIAGNOSIS — M6281 Muscle weakness (generalized): Secondary | ICD-10-CM | POA: Diagnosis not present

## 2018-06-01 DIAGNOSIS — R2689 Other abnormalities of gait and mobility: Secondary | ICD-10-CM | POA: Diagnosis not present

## 2018-06-01 DIAGNOSIS — D649 Anemia, unspecified: Secondary | ICD-10-CM | POA: Diagnosis not present

## 2018-06-01 DIAGNOSIS — I10 Essential (primary) hypertension: Secondary | ICD-10-CM | POA: Diagnosis not present

## 2018-06-01 DIAGNOSIS — R488 Other symbolic dysfunctions: Secondary | ICD-10-CM | POA: Diagnosis not present

## 2018-06-02 DIAGNOSIS — R2689 Other abnormalities of gait and mobility: Secondary | ICD-10-CM | POA: Diagnosis not present

## 2018-06-02 DIAGNOSIS — D649 Anemia, unspecified: Secondary | ICD-10-CM | POA: Diagnosis not present

## 2018-06-02 DIAGNOSIS — M6281 Muscle weakness (generalized): Secondary | ICD-10-CM | POA: Diagnosis not present

## 2018-06-02 DIAGNOSIS — R1310 Dysphagia, unspecified: Secondary | ICD-10-CM | POA: Diagnosis not present

## 2018-06-02 DIAGNOSIS — I10 Essential (primary) hypertension: Secondary | ICD-10-CM | POA: Diagnosis not present

## 2018-06-02 DIAGNOSIS — R488 Other symbolic dysfunctions: Secondary | ICD-10-CM | POA: Diagnosis not present

## 2018-06-03 DIAGNOSIS — D649 Anemia, unspecified: Secondary | ICD-10-CM | POA: Diagnosis not present

## 2018-06-03 DIAGNOSIS — I10 Essential (primary) hypertension: Secondary | ICD-10-CM | POA: Diagnosis not present

## 2018-06-03 DIAGNOSIS — R488 Other symbolic dysfunctions: Secondary | ICD-10-CM | POA: Diagnosis not present

## 2018-06-03 DIAGNOSIS — R1310 Dysphagia, unspecified: Secondary | ICD-10-CM | POA: Diagnosis not present

## 2018-06-03 DIAGNOSIS — M6281 Muscle weakness (generalized): Secondary | ICD-10-CM | POA: Diagnosis not present

## 2018-06-03 DIAGNOSIS — R2689 Other abnormalities of gait and mobility: Secondary | ICD-10-CM | POA: Diagnosis not present

## 2018-06-05 DIAGNOSIS — I10 Essential (primary) hypertension: Secondary | ICD-10-CM | POA: Diagnosis not present

## 2018-06-05 DIAGNOSIS — R488 Other symbolic dysfunctions: Secondary | ICD-10-CM | POA: Diagnosis not present

## 2018-06-05 DIAGNOSIS — M6281 Muscle weakness (generalized): Secondary | ICD-10-CM | POA: Diagnosis not present

## 2018-06-05 DIAGNOSIS — R2689 Other abnormalities of gait and mobility: Secondary | ICD-10-CM | POA: Diagnosis not present

## 2018-06-05 DIAGNOSIS — R1310 Dysphagia, unspecified: Secondary | ICD-10-CM | POA: Diagnosis not present

## 2018-06-05 DIAGNOSIS — D649 Anemia, unspecified: Secondary | ICD-10-CM | POA: Diagnosis not present

## 2018-06-06 DIAGNOSIS — R2689 Other abnormalities of gait and mobility: Secondary | ICD-10-CM | POA: Diagnosis not present

## 2018-06-06 DIAGNOSIS — R1310 Dysphagia, unspecified: Secondary | ICD-10-CM | POA: Diagnosis not present

## 2018-06-06 DIAGNOSIS — R488 Other symbolic dysfunctions: Secondary | ICD-10-CM | POA: Diagnosis not present

## 2018-06-06 DIAGNOSIS — D649 Anemia, unspecified: Secondary | ICD-10-CM | POA: Diagnosis not present

## 2018-06-06 DIAGNOSIS — I10 Essential (primary) hypertension: Secondary | ICD-10-CM | POA: Diagnosis not present

## 2018-06-06 DIAGNOSIS — M6281 Muscle weakness (generalized): Secondary | ICD-10-CM | POA: Diagnosis not present

## 2018-06-07 DIAGNOSIS — M6281 Muscle weakness (generalized): Secondary | ICD-10-CM | POA: Diagnosis not present

## 2018-06-07 DIAGNOSIS — I10 Essential (primary) hypertension: Secondary | ICD-10-CM | POA: Diagnosis not present

## 2018-06-07 DIAGNOSIS — D649 Anemia, unspecified: Secondary | ICD-10-CM | POA: Diagnosis not present

## 2018-06-07 DIAGNOSIS — R1310 Dysphagia, unspecified: Secondary | ICD-10-CM | POA: Diagnosis not present

## 2018-06-07 DIAGNOSIS — R2689 Other abnormalities of gait and mobility: Secondary | ICD-10-CM | POA: Diagnosis not present

## 2018-06-07 DIAGNOSIS — R488 Other symbolic dysfunctions: Secondary | ICD-10-CM | POA: Diagnosis not present

## 2018-06-08 DIAGNOSIS — R2689 Other abnormalities of gait and mobility: Secondary | ICD-10-CM | POA: Diagnosis not present

## 2018-06-08 DIAGNOSIS — R488 Other symbolic dysfunctions: Secondary | ICD-10-CM | POA: Diagnosis not present

## 2018-06-08 DIAGNOSIS — I10 Essential (primary) hypertension: Secondary | ICD-10-CM | POA: Diagnosis not present

## 2018-06-08 DIAGNOSIS — R1310 Dysphagia, unspecified: Secondary | ICD-10-CM | POA: Diagnosis not present

## 2018-06-08 DIAGNOSIS — M6281 Muscle weakness (generalized): Secondary | ICD-10-CM | POA: Diagnosis not present

## 2018-06-08 DIAGNOSIS — D649 Anemia, unspecified: Secondary | ICD-10-CM | POA: Diagnosis not present

## 2018-06-09 DIAGNOSIS — R1310 Dysphagia, unspecified: Secondary | ICD-10-CM | POA: Diagnosis not present

## 2018-06-09 DIAGNOSIS — R488 Other symbolic dysfunctions: Secondary | ICD-10-CM | POA: Diagnosis not present

## 2018-06-09 DIAGNOSIS — D649 Anemia, unspecified: Secondary | ICD-10-CM | POA: Diagnosis not present

## 2018-06-09 DIAGNOSIS — R2689 Other abnormalities of gait and mobility: Secondary | ICD-10-CM | POA: Diagnosis not present

## 2018-06-09 DIAGNOSIS — M6281 Muscle weakness (generalized): Secondary | ICD-10-CM | POA: Diagnosis not present

## 2018-06-09 DIAGNOSIS — I10 Essential (primary) hypertension: Secondary | ICD-10-CM | POA: Diagnosis not present

## 2018-06-10 DIAGNOSIS — M6281 Muscle weakness (generalized): Secondary | ICD-10-CM | POA: Diagnosis not present

## 2018-06-10 DIAGNOSIS — I10 Essential (primary) hypertension: Secondary | ICD-10-CM | POA: Diagnosis not present

## 2018-06-10 DIAGNOSIS — R1310 Dysphagia, unspecified: Secondary | ICD-10-CM | POA: Diagnosis not present

## 2018-06-10 DIAGNOSIS — R2689 Other abnormalities of gait and mobility: Secondary | ICD-10-CM | POA: Diagnosis not present

## 2018-06-10 DIAGNOSIS — R488 Other symbolic dysfunctions: Secondary | ICD-10-CM | POA: Diagnosis not present

## 2018-06-10 DIAGNOSIS — D649 Anemia, unspecified: Secondary | ICD-10-CM | POA: Diagnosis not present

## 2018-06-13 DIAGNOSIS — R2689 Other abnormalities of gait and mobility: Secondary | ICD-10-CM | POA: Diagnosis not present

## 2018-06-13 DIAGNOSIS — D649 Anemia, unspecified: Secondary | ICD-10-CM | POA: Diagnosis not present

## 2018-06-13 DIAGNOSIS — R488 Other symbolic dysfunctions: Secondary | ICD-10-CM | POA: Diagnosis not present

## 2018-06-13 DIAGNOSIS — R1310 Dysphagia, unspecified: Secondary | ICD-10-CM | POA: Diagnosis not present

## 2018-06-13 DIAGNOSIS — I10 Essential (primary) hypertension: Secondary | ICD-10-CM | POA: Diagnosis not present

## 2018-06-13 DIAGNOSIS — M6281 Muscle weakness (generalized): Secondary | ICD-10-CM | POA: Diagnosis not present

## 2018-06-14 DIAGNOSIS — M6281 Muscle weakness (generalized): Secondary | ICD-10-CM | POA: Diagnosis not present

## 2018-06-14 DIAGNOSIS — D649 Anemia, unspecified: Secondary | ICD-10-CM | POA: Diagnosis not present

## 2018-06-14 DIAGNOSIS — R488 Other symbolic dysfunctions: Secondary | ICD-10-CM | POA: Diagnosis not present

## 2018-06-14 DIAGNOSIS — I10 Essential (primary) hypertension: Secondary | ICD-10-CM | POA: Diagnosis not present

## 2018-06-14 DIAGNOSIS — R1310 Dysphagia, unspecified: Secondary | ICD-10-CM | POA: Diagnosis not present

## 2018-06-14 DIAGNOSIS — R2689 Other abnormalities of gait and mobility: Secondary | ICD-10-CM | POA: Diagnosis not present

## 2018-06-15 DIAGNOSIS — D649 Anemia, unspecified: Secondary | ICD-10-CM | POA: Diagnosis not present

## 2018-06-15 DIAGNOSIS — I10 Essential (primary) hypertension: Secondary | ICD-10-CM | POA: Diagnosis not present

## 2018-06-15 DIAGNOSIS — R2689 Other abnormalities of gait and mobility: Secondary | ICD-10-CM | POA: Diagnosis not present

## 2018-06-15 DIAGNOSIS — M6281 Muscle weakness (generalized): Secondary | ICD-10-CM | POA: Diagnosis not present

## 2018-06-15 DIAGNOSIS — R1310 Dysphagia, unspecified: Secondary | ICD-10-CM | POA: Diagnosis not present

## 2018-06-15 DIAGNOSIS — R488 Other symbolic dysfunctions: Secondary | ICD-10-CM | POA: Diagnosis not present

## 2018-06-16 DIAGNOSIS — R1310 Dysphagia, unspecified: Secondary | ICD-10-CM | POA: Diagnosis not present

## 2018-06-16 DIAGNOSIS — I10 Essential (primary) hypertension: Secondary | ICD-10-CM | POA: Diagnosis not present

## 2018-06-16 DIAGNOSIS — R2689 Other abnormalities of gait and mobility: Secondary | ICD-10-CM | POA: Diagnosis not present

## 2018-06-16 DIAGNOSIS — M6281 Muscle weakness (generalized): Secondary | ICD-10-CM | POA: Diagnosis not present

## 2018-06-16 DIAGNOSIS — D649 Anemia, unspecified: Secondary | ICD-10-CM | POA: Diagnosis not present

## 2018-06-16 DIAGNOSIS — R488 Other symbolic dysfunctions: Secondary | ICD-10-CM | POA: Diagnosis not present

## 2018-06-17 DIAGNOSIS — R2689 Other abnormalities of gait and mobility: Secondary | ICD-10-CM | POA: Diagnosis not present

## 2018-06-17 DIAGNOSIS — R1310 Dysphagia, unspecified: Secondary | ICD-10-CM | POA: Diagnosis not present

## 2018-06-17 DIAGNOSIS — D649 Anemia, unspecified: Secondary | ICD-10-CM | POA: Diagnosis not present

## 2018-06-17 DIAGNOSIS — I10 Essential (primary) hypertension: Secondary | ICD-10-CM | POA: Diagnosis not present

## 2018-06-17 DIAGNOSIS — R488 Other symbolic dysfunctions: Secondary | ICD-10-CM | POA: Diagnosis not present

## 2018-06-17 DIAGNOSIS — M6281 Muscle weakness (generalized): Secondary | ICD-10-CM | POA: Diagnosis not present

## 2018-06-20 DIAGNOSIS — I10 Essential (primary) hypertension: Secondary | ICD-10-CM | POA: Diagnosis not present

## 2018-06-20 DIAGNOSIS — R2689 Other abnormalities of gait and mobility: Secondary | ICD-10-CM | POA: Diagnosis not present

## 2018-06-20 DIAGNOSIS — R1310 Dysphagia, unspecified: Secondary | ICD-10-CM | POA: Diagnosis not present

## 2018-06-20 DIAGNOSIS — R488 Other symbolic dysfunctions: Secondary | ICD-10-CM | POA: Diagnosis not present

## 2018-06-20 DIAGNOSIS — D649 Anemia, unspecified: Secondary | ICD-10-CM | POA: Diagnosis not present

## 2018-06-20 DIAGNOSIS — M6281 Muscle weakness (generalized): Secondary | ICD-10-CM | POA: Diagnosis not present

## 2018-06-21 DIAGNOSIS — I639 Cerebral infarction, unspecified: Secondary | ICD-10-CM | POA: Diagnosis not present

## 2018-06-21 DIAGNOSIS — M6281 Muscle weakness (generalized): Secondary | ICD-10-CM | POA: Diagnosis not present

## 2018-06-21 DIAGNOSIS — I10 Essential (primary) hypertension: Secondary | ICD-10-CM | POA: Diagnosis not present

## 2018-06-21 DIAGNOSIS — D649 Anemia, unspecified: Secondary | ICD-10-CM | POA: Diagnosis not present

## 2018-06-21 DIAGNOSIS — R7303 Prediabetes: Secondary | ICD-10-CM | POA: Diagnosis not present

## 2018-06-21 DIAGNOSIS — R1310 Dysphagia, unspecified: Secondary | ICD-10-CM | POA: Diagnosis not present

## 2018-06-21 DIAGNOSIS — R2689 Other abnormalities of gait and mobility: Secondary | ICD-10-CM | POA: Diagnosis not present

## 2018-06-22 DIAGNOSIS — I10 Essential (primary) hypertension: Secondary | ICD-10-CM | POA: Diagnosis not present

## 2018-06-22 DIAGNOSIS — D649 Anemia, unspecified: Secondary | ICD-10-CM | POA: Diagnosis not present

## 2018-06-22 DIAGNOSIS — M6281 Muscle weakness (generalized): Secondary | ICD-10-CM | POA: Diagnosis not present

## 2018-06-22 DIAGNOSIS — R2689 Other abnormalities of gait and mobility: Secondary | ICD-10-CM | POA: Diagnosis not present

## 2018-06-22 DIAGNOSIS — R1310 Dysphagia, unspecified: Secondary | ICD-10-CM | POA: Diagnosis not present

## 2018-06-23 DIAGNOSIS — I10 Essential (primary) hypertension: Secondary | ICD-10-CM | POA: Diagnosis not present

## 2018-06-23 DIAGNOSIS — M6281 Muscle weakness (generalized): Secondary | ICD-10-CM | POA: Diagnosis not present

## 2018-06-23 DIAGNOSIS — R2689 Other abnormalities of gait and mobility: Secondary | ICD-10-CM | POA: Diagnosis not present

## 2018-06-23 DIAGNOSIS — R1310 Dysphagia, unspecified: Secondary | ICD-10-CM | POA: Diagnosis not present

## 2018-06-23 DIAGNOSIS — D649 Anemia, unspecified: Secondary | ICD-10-CM | POA: Diagnosis not present

## 2018-06-27 DIAGNOSIS — M6281 Muscle weakness (generalized): Secondary | ICD-10-CM | POA: Diagnosis not present

## 2018-06-27 DIAGNOSIS — D649 Anemia, unspecified: Secondary | ICD-10-CM | POA: Diagnosis not present

## 2018-06-27 DIAGNOSIS — I10 Essential (primary) hypertension: Secondary | ICD-10-CM | POA: Diagnosis not present

## 2018-06-27 DIAGNOSIS — R1310 Dysphagia, unspecified: Secondary | ICD-10-CM | POA: Diagnosis not present

## 2018-06-27 DIAGNOSIS — F419 Anxiety disorder, unspecified: Secondary | ICD-10-CM | POA: Diagnosis not present

## 2018-06-27 DIAGNOSIS — R2689 Other abnormalities of gait and mobility: Secondary | ICD-10-CM | POA: Diagnosis not present

## 2018-06-27 DIAGNOSIS — I639 Cerebral infarction, unspecified: Secondary | ICD-10-CM | POA: Diagnosis not present

## 2018-06-27 DIAGNOSIS — F3341 Major depressive disorder, recurrent, in partial remission: Secondary | ICD-10-CM | POA: Diagnosis not present

## 2018-06-28 DIAGNOSIS — R2689 Other abnormalities of gait and mobility: Secondary | ICD-10-CM | POA: Diagnosis not present

## 2018-06-28 DIAGNOSIS — M6281 Muscle weakness (generalized): Secondary | ICD-10-CM | POA: Diagnosis not present

## 2018-06-28 DIAGNOSIS — R1310 Dysphagia, unspecified: Secondary | ICD-10-CM | POA: Diagnosis not present

## 2018-06-28 DIAGNOSIS — D649 Anemia, unspecified: Secondary | ICD-10-CM | POA: Diagnosis not present

## 2018-06-28 DIAGNOSIS — I10 Essential (primary) hypertension: Secondary | ICD-10-CM | POA: Diagnosis not present

## 2018-06-29 DIAGNOSIS — I10 Essential (primary) hypertension: Secondary | ICD-10-CM | POA: Diagnosis not present

## 2018-06-29 DIAGNOSIS — M6281 Muscle weakness (generalized): Secondary | ICD-10-CM | POA: Diagnosis not present

## 2018-06-29 DIAGNOSIS — R1310 Dysphagia, unspecified: Secondary | ICD-10-CM | POA: Diagnosis not present

## 2018-06-29 DIAGNOSIS — D649 Anemia, unspecified: Secondary | ICD-10-CM | POA: Diagnosis not present

## 2018-06-29 DIAGNOSIS — R2689 Other abnormalities of gait and mobility: Secondary | ICD-10-CM | POA: Diagnosis not present

## 2018-06-30 DIAGNOSIS — R2689 Other abnormalities of gait and mobility: Secondary | ICD-10-CM | POA: Diagnosis not present

## 2018-06-30 DIAGNOSIS — R1310 Dysphagia, unspecified: Secondary | ICD-10-CM | POA: Diagnosis not present

## 2018-06-30 DIAGNOSIS — M6281 Muscle weakness (generalized): Secondary | ICD-10-CM | POA: Diagnosis not present

## 2018-06-30 DIAGNOSIS — I10 Essential (primary) hypertension: Secondary | ICD-10-CM | POA: Diagnosis not present

## 2018-06-30 DIAGNOSIS — D649 Anemia, unspecified: Secondary | ICD-10-CM | POA: Diagnosis not present

## 2018-07-01 DIAGNOSIS — I10 Essential (primary) hypertension: Secondary | ICD-10-CM | POA: Diagnosis not present

## 2018-07-01 DIAGNOSIS — D649 Anemia, unspecified: Secondary | ICD-10-CM | POA: Diagnosis not present

## 2018-07-01 DIAGNOSIS — R1310 Dysphagia, unspecified: Secondary | ICD-10-CM | POA: Diagnosis not present

## 2018-07-01 DIAGNOSIS — R2689 Other abnormalities of gait and mobility: Secondary | ICD-10-CM | POA: Diagnosis not present

## 2018-07-01 DIAGNOSIS — M6281 Muscle weakness (generalized): Secondary | ICD-10-CM | POA: Diagnosis not present

## 2018-07-29 DIAGNOSIS — I10 Essential (primary) hypertension: Secondary | ICD-10-CM | POA: Diagnosis not present

## 2018-07-29 DIAGNOSIS — I639 Cerebral infarction, unspecified: Secondary | ICD-10-CM | POA: Diagnosis not present

## 2018-07-29 DIAGNOSIS — R5381 Other malaise: Secondary | ICD-10-CM | POA: Diagnosis not present

## 2018-08-08 ENCOUNTER — Emergency Department (HOSPITAL_COMMUNITY)
Admission: EM | Admit: 2018-08-08 | Discharge: 2018-08-08 | Disposition: A | Discharge status: Home / Self Care | Payer: Medicare Other | Attending: Emergency Medicine | Admitting: Emergency Medicine

## 2018-08-08 ENCOUNTER — Other Ambulatory Visit: Payer: Self-pay

## 2018-08-08 ENCOUNTER — Emergency Department (HOSPITAL_COMMUNITY): Payer: Medicare Other

## 2018-08-08 DIAGNOSIS — G459 Transient cerebral ischemic attack, unspecified: Secondary | ICD-10-CM | POA: Diagnosis not present

## 2018-08-08 DIAGNOSIS — I110 Hypertensive heart disease with heart failure: Secondary | ICD-10-CM | POA: Diagnosis not present

## 2018-08-08 DIAGNOSIS — Z7902 Long term (current) use of antithrombotics/antiplatelets: Secondary | ICD-10-CM | POA: Insufficient documentation

## 2018-08-08 DIAGNOSIS — R5381 Other malaise: Secondary | ICD-10-CM | POA: Diagnosis not present

## 2018-08-08 DIAGNOSIS — I5030 Unspecified diastolic (congestive) heart failure: Secondary | ICD-10-CM | POA: Diagnosis not present

## 2018-08-08 DIAGNOSIS — S0592XA Unspecified injury of left eye and orbit, initial encounter: Secondary | ICD-10-CM | POA: Diagnosis present

## 2018-08-08 DIAGNOSIS — Z79899 Other long term (current) drug therapy: Secondary | ICD-10-CM | POA: Insufficient documentation

## 2018-08-08 DIAGNOSIS — Z7982 Long term (current) use of aspirin: Secondary | ICD-10-CM | POA: Diagnosis not present

## 2018-08-08 DIAGNOSIS — Y939 Activity, unspecified: Secondary | ICD-10-CM | POA: Insufficient documentation

## 2018-08-08 DIAGNOSIS — H40212 Acute angle-closure glaucoma, left eye: Secondary | ICD-10-CM | POA: Diagnosis not present

## 2018-08-08 DIAGNOSIS — Y92129 Unspecified place in nursing home as the place of occurrence of the external cause: Secondary | ICD-10-CM | POA: Insufficient documentation

## 2018-08-08 DIAGNOSIS — X58XXXA Exposure to other specified factors, initial encounter: Secondary | ICD-10-CM | POA: Insufficient documentation

## 2018-08-08 DIAGNOSIS — Y999 Unspecified external cause status: Secondary | ICD-10-CM | POA: Insufficient documentation

## 2018-08-08 DIAGNOSIS — Z7401 Bed confinement status: Secondary | ICD-10-CM | POA: Diagnosis not present

## 2018-08-08 DIAGNOSIS — I10 Essential (primary) hypertension: Secondary | ICD-10-CM | POA: Diagnosis not present

## 2018-08-08 DIAGNOSIS — R404 Transient alteration of awareness: Secondary | ICD-10-CM | POA: Diagnosis not present

## 2018-08-08 DIAGNOSIS — R4182 Altered mental status, unspecified: Secondary | ICD-10-CM | POA: Diagnosis not present

## 2018-08-08 DIAGNOSIS — R51 Headache: Secondary | ICD-10-CM | POA: Insufficient documentation

## 2018-08-08 DIAGNOSIS — H409 Unspecified glaucoma: Secondary | ICD-10-CM | POA: Diagnosis not present

## 2018-08-08 DIAGNOSIS — S0502XA Injury of conjunctiva and corneal abrasion without foreign body, left eye, initial encounter: Secondary | ICD-10-CM | POA: Insufficient documentation

## 2018-08-08 DIAGNOSIS — M2550 Pain in unspecified joint: Secondary | ICD-10-CM | POA: Diagnosis not present

## 2018-08-08 LAB — COMPREHENSIVE METABOLIC PANEL
ALT: 27 U/L (ref 0–44)
AST: 23 U/L (ref 15–41)
Albumin: 4.2 g/dL (ref 3.5–5.0)
Alkaline Phosphatase: 85 U/L (ref 38–126)
Anion gap: 8 (ref 5–15)
BUN: 15 mg/dL (ref 8–23)
CO2: 26 mmol/L (ref 22–32)
Calcium: 10.1 mg/dL (ref 8.9–10.3)
Chloride: 103 mmol/L (ref 98–111)
Creatinine, Ser: 1.04 mg/dL (ref 0.61–1.24)
GFR calc Af Amer: >60 mL/min (ref >60)
GFR calc non Af Amer: >60 mL/min (ref >60)
Glucose, Bld: 119 mg/dL — ABNORMAL HIGH (ref 70–99)
Potassium: 4.5 mmol/L (ref 3.5–5.1)
Sodium: 137 mmol/L (ref 135–145)
Total Bilirubin: 1.1 mg/dL (ref 0.3–1.2)
Total Protein: 8.3 g/dL — ABNORMAL HIGH (ref 6.5–8.1)

## 2018-08-08 LAB — I-STAT CHEM 8, ED
BUN: 19 mg/dL (ref 8–23)
Calcium, Ion: 1.22 mmol/L (ref 1.15–1.40)
Chloride: 105 mmol/L (ref 98–111)
Creatinine, Ser: 1 mg/dL (ref 0.61–1.24)
Glucose, Bld: 115 mg/dL — ABNORMAL HIGH (ref 70–99)
HCT: 42 % (ref 39.0–52.0)
Hemoglobin: 14.3 g/dL (ref 13.0–17.0)
Potassium: 4.5 mmol/L (ref 3.5–5.1)
Sodium: 139 mmol/L (ref 135–145)
TCO2: 29 mmol/L (ref 22–32)

## 2018-08-08 LAB — CBG MONITORING, ED
Glucose-Capillary: 104 mg/dL — ABNORMAL HIGH (ref 70–99)
Glucose-Capillary: 114 mg/dL — ABNORMAL HIGH (ref 70–99)

## 2018-08-08 LAB — I-STAT TROPONIN, ED: Troponin i, poc: 0 ng/mL (ref 0.00–0.08)

## 2018-08-08 LAB — I-STAT CG4 LACTIC ACID, ED: Lactic Acid, Venous: 1.58 mmol/L (ref 0.5–1.9)

## 2018-08-08 MED ORDER — HYDROCODONE-ACETAMINOPHEN 5-325 MG PO TABS: 1.0000 | ORAL_TABLET | Freq: Four times a day (QID) | ORAL | 0 refills | Status: DC | PRN

## 2018-08-08 MED ORDER — TIMOLOL MALEATE 0.5 % OP SOLN
1.0000 [drp] | Freq: Once | OPHTHALMIC | Status: AC
  Administered 2018-08-08: 1 [drp] via OPHTHALMIC
  Filled 2018-08-08: qty 5

## 2018-08-08 MED ORDER — TETRACAINE HCL 0.5 % OP SOLN
2.0000 [drp] | Freq: Once | OPHTHALMIC | Status: AC
  Administered 2018-08-08: 2 [drp] via OPHTHALMIC
  Filled 2018-08-08: qty 4

## 2018-08-08 MED ORDER — ACETAZOLAMIDE SODIUM 500 MG IJ SOLR
500.0000 mg | Freq: Once | INTRAMUSCULAR | Status: AC
  Administered 2018-08-08: 500 mg via INTRAVENOUS
  Filled 2018-08-08: qty 500

## 2018-08-08 MED ORDER — PILOCARPINE HCL 1 % OP SOLN
1.0000 [drp] | Freq: Once | OPHTHALMIC | Status: DC
  Filled 2018-08-08: qty 15

## 2018-08-08 MED ORDER — FLUORESCEIN SODIUM 1 MG OP STRP
1.0000 | ORAL_STRIP | Freq: Once | OPHTHALMIC | Status: AC
  Administered 2018-08-08: 1 via OPHTHALMIC
  Filled 2018-08-08: qty 1

## 2018-08-08 MED ORDER — FLUORESCEIN SODIUM 1 MG OP STRP
1.0000 | ORAL_STRIP | Freq: Once | OPHTHALMIC | Status: AC
  Administered 2018-08-08: 1 via OPHTHALMIC

## 2018-08-08 MED ORDER — STERILE WATER FOR INJECTION IJ SOLN
INTRAMUSCULAR | Status: AC
  Administered 2018-08-08: 17:00:00
  Filled 2018-08-08: qty 10

## 2018-08-08 MED ORDER — PREDNISOLONE ACETATE 1 % OP SUSP
2.0000 [drp] | Freq: Once | OPHTHALMIC | Status: AC
  Administered 2018-08-08: 2 [drp] via OPHTHALMIC
  Filled 2018-08-08: qty 5

## 2018-08-08 MED ORDER — BRIMONIDINE TARTRATE 0.2 % OP SOLN: 1.0000 [drp] | Freq: Three times a day (TID) | OPHTHALMIC | 12 refills | Status: DC

## 2018-08-08 MED ORDER — MORPHINE SULFATE (PF) 4 MG/ML IV SOLN
4.0000 mg | Freq: Once | INTRAVENOUS | Status: AC
  Administered 2018-08-08: 4 mg via INTRAVENOUS
  Filled 2018-08-08: qty 1

## 2018-08-08 MED ORDER — PREDNISOLONE ACETATE 1 % OP SUSP
1.0000 [drp] | Freq: Once | OPHTHALMIC | Status: DC
  Filled 2018-08-08: qty 5

## 2018-08-08 MED ORDER — LATANOPROST 0.005 % OP SOLN: 1.0000 [drp] | Freq: Every day | OPHTHALMIC | 12 refills | Status: AC

## 2018-08-08 MED ORDER — STERILE WATER FOR INJECTION IJ SOLN
10.0000 mL | Freq: Once | INTRAMUSCULAR | Status: AC
  Administered 2018-08-08: 10 mL via INTRAMUSCULAR

## 2018-08-08 MED ORDER — OFLOXACIN 0.3 % OP SOLN
1.0000 [drp] | Freq: Four times a day (QID) | OPHTHALMIC | Status: DC
  Filled 2018-08-08: qty 5

## 2018-08-08 MED ORDER — DORZOLAMIDE HCL 2 % OP SOLN: 1.0000 [drp] | Freq: Three times a day (TID) | OPHTHALMIC | 12 refills | Status: DC

## 2018-08-08 MED ORDER — STERILE WATER FOR INJECTION IJ SOLN: 5.0000 mL | Freq: Once | INTRAMUSCULAR | Status: DC

## 2018-08-08 NOTE — ED Notes (Signed)
PTAR arrived for patient 

## 2018-08-08 NOTE — Discharge Instructions (Addendum)
Please take the following medicines starting tonight:   Pilocarpine (sent home with patient) one drop in the left eye every 4 hours (next ddrop 10 PM) Timolol (sent home with patient) one drop in left eye every 12 hours - next drop in the morning (6 AM) Prednisolone (sent home with patient) - one drop 3 times daily for the left eye. Alphagan  2% - 1 drop in the left eye 3 times daily (next drop morning) Dorzolamide 1 drop in the left eye 3 times daily (next dose at 6 AM) Latanoprost - one drop daily (next dose at 5 PM tomorrow)  Ofloxacin - 1 drop every 6 hours for infection prevention in the left ey e- next drop 12 AM tonight. (dispensed with patient home)  Hydrocodone 1 tablet every 6 hours as needed for pain  Please call the office of Dr. Charlotte SanesMccuen first thing in the morning, she has requested to see you tomorrow.  They will be able to fit you in.  You MUST be seen tomorrow, this is a MUST, return to the emergency department for increasing pain

## 2018-08-08 NOTE — ED Triage Notes (Signed)
Pt from [facility] with PTAR for headache?. Staff found pt in room holding his head. Pt will not respond or verbalize needs. Pt only speaks madarin. Pt would not speak with granddaughter over the phone and refused his medications this morning. Pt alert but non verbal upon arrival. Pt holding head

## 2018-08-08 NOTE — ED Notes (Signed)
Unable to get EKG within time due to only one EKG; EKG being use for multiple cp and sob's through front back to back.

## 2018-08-08 NOTE — ED Notes (Signed)
Got patient undress on the monitor nurse at bedside

## 2018-08-08 NOTE — Consult Note (Signed)
Reason for consult:  Red, painful left eye  HPI: Jorge Burns is an 77 y.o. male.  He has a history of CVA and in non-communicative.  Pt was holding his hand over his eye and appeared to be in pain.  Nursing home sent him here. ER doctor noted mid-dilated pupil OS and IOP elevation OS.  Past Medical History:  Diagnosis Date  . Hypertension   . Stroke [facility])    "a long time ago -- 2 years ago"   Past Surgical History:  Procedure Laterality Date  . IR ANGIO INTRA EXTRACRAN SEL COM CAROTID INNOMINATE BILAT MOD SED  02/15/2018  . IR ANGIO VERTEBRAL SEL SUBCLAVIAN INNOMINATE UNI R MOD SED  02/15/2018  . IR ANGIO VERTEBRAL SEL VERTEBRAL UNI L MOD SED  02/15/2018   No family history on file. Current Facility-Administered Medications  Medication Dose Route Frequency Provider Last Rate Last Dose  . ofloxacin (OCUFLOX) 0.3 % ophthalmic solution 1 drop  1 drop Left Eye QID Noemi Chapel, MD       Current Outpatient Medications  Medication Sig Dispense Refill  . aspirin 81 MG chewable tablet Chew 1 tablet (81 mg total) by mouth daily.    Marland Kitchen atorvastatin (LIPITOR) 80 MG tablet Take 1 tablet (80 mg total) by mouth daily at 6 PM. 30 tablet 0  . citalopram (CELEXA) 10 MG tablet Take 1 tablet (10 mg total) by mouth daily. 30 tablet 1  . clopidogrel (PLAVIX) 75 MG tablet Take 1 tablet (75 mg total) by mouth daily. 30 tablet 1  . hydrocortisone (ANUSOL-HC) 2.5 % rectal cream Apply rectally 2 times daily 28.35 g 0  . lisinopril (PRINIVIL,ZESTRIL) 2.5 MG tablet Take 1 tablet (2.5 mg total) by mouth daily. 30 tablet 1  . methylphenidate (RITALIN) 10 MG tablet Take 1 tablet (10 mg total) by mouth 2 (two) times daily with breakfast and lunch. 60 tablet 0  . potassium chloride SA (K-DUR,KLOR-CON) 20 MEQ tablet Take 1 tablet (20 mEq total) by mouth daily. 7 tablet 0   No Known Allergies Social History   Socioeconomic History  . Marital status: Single    Spouse name: Not on file  . Number of children: Not on  file  . Years of education: Not on file  . Highest education level: Not on file  Occupational History  . Not on file  Social Needs  . Financial resource strain: Not on file  . Food insecurity:    Worry: Not on file    Inability: Not on file  . Transportation needs:    Medical: Not on file    Non-medical: Not on file  Tobacco Use  . Smoking status: Never Smoker  . Smokeless tobacco: Never Used  Substance and Sexual Activity  . Alcohol use: No  . Drug use: No  . Sexual activity: Not on file  Lifestyle  . Physical activity:    Days per week: Not on file    Minutes per session: Not on file  . Stress: Not on file  Relationships  . Social connections:    Talks on phone: Not on file    Gets together: Not on file    Attends religious service: Not on file    Active member of club or organization: Not on file    Attends meetings of clubs or organizations: Not on file    Relationship status: Not on file  . Intimate partner violence:    Fear of current or ex partner: Not on file  Emotionally abused: Not on file    Physically abused: Not on file    Forced sexual activity: Not on file  Other Topics Concern  . Not on file  Social History Narrative   Language barrier. Daughter participate in his therapy    Review of systems: ROS:  Unable.  Pt does not communicate  Physical Exam:  Blood pressure (!) 159/82, pulse 80, temperature 97.7 F (36.5 C), temperature source Axillary, resp. rate 16, SpO2 100 %.   VA cc winces to light OU  Pupils:   OD round about 2 mm, reactive to light,            OS round, about 4 mm, not reactive to light, UNABLE TO ASSESS FOR RAPD (Patient in too much pain)  IOP (T pen)  OD 19  OS  39  FXJ:[id number]  Motility:  Unable.  Pt not following commands.  Grossly full  Balance/alignment:  Ortho by Luiz Ochoa?   (difficult)   Bedside examination: General:  Pt in bed and appears to be in pain.  He is holding his hand over his left eye.  He pulls at my  arms and hands when I try to examine his eye.                                  OD                                       External/adnexa: Normal                                      Lids/lashes:        Normal                                      Conjunctiva        White, quiet        Cornea:              Clear                  AC:                     Formed -- difficult to assess depth.  (not cooperative)                                Iris:                     Normal        Lens:                  NS                                       OS                                       External/adnexa:  Normal                                      Lids/lashes:        Normal                                      Conjunctiva        1-2+ injection       Cornea:              Slightly cloudy with fluorescein uptake (Previously tono-pened and fluoreceined by ER)                  AC:                     Formed.  No hyphema  Difficult to assess depth                                Iris:                     Mid-dlated pupil        Lens:                  NS            Labs/studies: Results for orders placed or performed during the hospital encounter of 08/08/18 (from the past 48 hour(s))  Comprehensive metabolic panel     Status: Abnormal   Collection Time: 08/08/18  2:31 PM  Result Value Ref Range   Sodium 137 135 - 145 mmol/L   Potassium 4.5 3.5 - 5.1 mmol/L   Chloride 103 98 - 111 mmol/L   CO2 26 22 - 32 mmol/L   Glucose, Bld 119 (H) 70 - 99 mg/dL   BUN 15 8 - 23 mg/dL   Creatinine, Ser 1.04 0.61 - 1.24 mg/dL   Calcium 10.1 8.9 - 10.3 mg/dL   Total Protein 8.3 (H) 6.5 - 8.1 g/dL   Albumin 4.2 3.5 - 5.0 g/dL   AST 23 15 - 41 U/L   ALT 27 0 - 44 U/L   Alkaline Phosphatase 85 38 - 126 U/L   Total Bilirubin 1.1 0.3 - 1.2 mg/dL   GFR calc non Af Amer >60 >60 mL/min   GFR calc Af Amer >60 >60 mL/min    Comment: (NOTE) The eGFR has been calculated using the CKD EPI equation. This calculation has  not been validated in all clinical situations. eGFR's persistently <60 mL/min signify possible Chronic Kidney Disease.    Anion gap 8 5 - 15    Comment: Performed at [facility] [address], [place], [place] [address]  CBG monitoring, ED     Status: Abnormal   Collection Time: 08/08/18  2:34 PM  Result Value Ref Range   Glucose-Capillary 104 (H) 70 - 99 mg/dL   Comment 1 Notify RN    Comment 2 Document in Chart   CBG monitoring, ED     Status: Abnormal   Collection Time: 08/08/18  2:40 PM  Result Value Ref Range   Glucose-Capillary 114 (H) 70 - 99 mg/dL  I-stat troponin, ED     Status: None   Collection Time: 08/08/18  2:42 PM  Result Value Ref Range   Troponin i, poc 0.00 0.00 - 0.08 ng/mL   Comment 3            Comment: Due to the release kinetics of cTnI, a negative result within the first hours of the onset of symptoms does not rule out myocardial infarction with certainty. If myocardial infarction is still suspected, repeat the test at appropriate intervals.   I-stat Chem 8, ED     Status: Abnormal   Collection Time: 08/08/18  2:43 PM  Result Value Ref Range   Sodium 139 135 - 145 mmol/L   Potassium 4.5 3.5 - 5.1 mmol/L   Chloride 105 98 - 111 mmol/L   BUN 19 8 - 23 mg/dL   Creatinine, Ser 1.00 0.61 - 1.24 mg/dL   Glucose, Bld 115 (H) 70 - 99 mg/dL   Calcium, Ion 1.22 1.15 - 1.40 mmol/L   TCO2 29 22 - 32 mmol/L   Hemoglobin 14.3 13.0 - 17.0 g/dL   HCT 42.0 39.0 - 52.0 %  I-Stat CG4 Lactic Acid, ED     Status: None   Collection Time: 08/08/18  2:47 PM  Result Value Ref Range   Lactic Acid, Venous 1.58 0.5 - 1.9 mmol/L   Ct Head Wo Contrast  Result Date: 08/08/2018 CLINICAL DATA:  77 year old male with acute headache. EXAM: CT HEAD WITHOUT CONTRAST TECHNIQUE: Contiguous axial images were obtained from the base of the skull through the vertex without intravenous contrast. COMPARISON:  02/16/2018 CT, 02/13/2018 MR and prior studies FINDINGS: Brain: A large  LEFT frontoparietal infarct is noted and most portions appear remote. Remote LEFT basal ganglia and periventricular white matter infarcts are present. A remote RIGHT temporoparietal infarct is present. No hemorrhage, midline shift, mass lesion or extra-axial collection. Vascular: Carotid atherosclerotic calcifications noted. Skull: No acute abnormality. Sinuses/Orbits: No acute abnormality Other: None IMPRESSION: 1. Large LEFT hemispheric infarct which appears remote. Small acute to subacute in these areas are difficult to exclude. Consider MR as clinically indicated. 2. Remote RIGHT temporoparietal infarct Electronically Signed   By: Margarette Canada M.D.   On: 08/08/2018 15:39                             Assessment and Plan:  Acute angle closure glaucoma.  Patient with dementia, not able to follow directions, not cooperative.  Would be unable to perform YAG PI even if [facility] still had a laser.  Will try to break medically.  Plan:  1.  Pilocarpine 1% OS now and q 4 hours            2.  Timolol 0.5% OS now and q 12 hours  3.  Alphagan 2 %  OS now and tid  4.  Latanoprost now OS and qd  5.  Diamox 500 mg IV now  6.  Dorzolamide now and tid OS             7.  Pain control as per ER attending and admitting team             8.  Consider admission to ensure compliance   I will return to recheck IOP.   [place] L 08/08/2018, 4:56 PM  [facility] [phone]

## 2018-08-08 NOTE — Progress Notes (Signed)
I came back to recheck IOP OS.  It is currently down to 30.  Patient seems much more comfortable.  He let me check his IOP without fighting me this time.  He no longer is covering his eye with his hand.  One drop of Prednisolone 1 % given now OS.  Plan:  Add prednisolone tid OS to the other drops I recommended in last note.  As long as his care facility can administer the drops as recommended, I think he is OK to be discharged.   Follow-up at my office tomorrow for IOP check.  Maris Bergerhristine Prabhjot Maddux, MD [phone]

## 2018-08-08 NOTE — ED Notes (Signed)
Pt stable, discharge instructions reviewed with facility by Marylene LandAngela, RN.

## 2018-08-08 NOTE — ED Provider Notes (Signed)
[facility] EMERGENCY DEPARTMENT Provider Note   CSN: [id number] Arrival date & time: 08/08/18  1413     History   Chief Complaint Chief Complaint  Patient presents with  . Headache    HPI Travus Degnan is a 77 y.o. male with a PMH of stroke presenting from [facility] with altered mental status. Patient was seen by staff holding his head and not responding to family friend over the phone. Patient also refused his medications this morning. Patient is from [place]. Provided interpreter, but patient did not respond. Patient is a poor historian due to his history of being minimally verbal and not responding to interpreter. Family friend, Doree Fudge, is responsible for the patient's care. Patient does not have any other family in the [place].  Level 5 caveat.   HPI  Past Medical History:  Diagnosis Date  . Hypertension   . Stroke [facility])    "a long time ago -- 2 years ago"    Patient Active Problem List   Diagnosis Date Noted  . Dysphagia, post-stroke   . Essential hypertension   . 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 Right hemiparesis (HCC)   . Pulmonary fungal infection   . Diastolic dysfunction   . Prediabetes   . CVA (cerebral vascular accident) (HCC) 02/13/2018  . Language barrier   . Benign essential HTN     Past Surgical History:  Procedure Laterality Date  . IR ANGIO INTRA EXTRACRAN SEL COM CAROTID INNOMINATE BILAT MOD SED  02/15/2018  . IR ANGIO VERTEBRAL SEL SUBCLAVIAN INNOMINATE UNI R MOD SED  02/15/2018  . IR ANGIO VERTEBRAL SEL VERTEBRAL UNI L MOD SED  02/15/2018        Home Medications    Prior to Admission medications   Medication Sig Start Date End Date Taking? Authorizing Provider  aspirin 81 MG chewable tablet Chew 1 tablet (81 mg total) by mouth daily. 02/19/18   Leatha Gilding, MD  atorvastatin (LIPITOR) 80 MG tablet Take 1 tablet (80 mg total) by mouth daily at 6 PM. 04/12/18   Jones Bales, NP  brimonidine  (ALPHAGAN) 0.2 % ophthalmic solution Place 1 drop into the left eye 3 (three) times daily. 08/08/18   Eber Hong, MD  citalopram (CELEXA) 10 MG tablet Take 1 tablet (10 mg total) by mouth daily. 03/11/18   Angiulli, Mcarthur Rossetti, PA-C  clopidogrel (PLAVIX) 75 MG tablet Take 1 tablet (75 mg total) by mouth daily. 03/11/18   Angiulli, Mcarthur Rossetti, PA-C  dorzolamide (TRUSOPT) 2 % ophthalmic solution Place 1 drop into the left eye 3 (three) times daily. 08/08/18   Eber Hong, MD  HYDROcodone-acetaminophen (NORCO/VICODIN) 5-325 MG tablet Take 1 tablet by mouth every 6 (six) hours as needed. 08/08/18   Eber Hong, MD  hydrocortisone (ANUSOL-HC) 2.5 % rectal cream Apply rectally 2 times daily 03/22/18   Wieters, Hallie C, PA-C  latanoprost (XALATAN) 0.005 % ophthalmic solution Place 1 drop into the left eye at bedtime. 08/08/18   Eber Hong, MD  lisinopril (PRINIVIL,ZESTRIL) 2.5 MG tablet Take 1 tablet (2.5 mg total) by mouth daily. 03/11/18   Angiulli, Mcarthur Rossetti, PA-C  methylphenidate (RITALIN) 10 MG tablet Take 1 tablet (10 mg total) by mouth 2 (two) times daily with breakfast and lunch. 03/11/18   Angiulli, Mcarthur Rossetti, PA-C  potassium chloride SA (K-DUR,KLOR-CON) 20 MEQ tablet Take 1 tablet (20 mEq total) by mouth daily. 04/08/18   Gilda Crease, MD    Family History  No family history on file.  Social History Social History   Tobacco Use  . Smoking status: Never Smoker  . Smokeless tobacco: Never Used  Substance Use Topics  . Alcohol use: No  . Drug use: No     Allergies   Patient has no known allergies.   Review of Systems Review of Systems  Unable to perform ROS: Mental status change     Physical Exam Updated Vital Signs BP (!) 147/82 (BP Location: Right Arm)   Pulse 79   Temp 97.9 F (36.6 C) (Axillary)   Resp 17   SpO2 100%   Physical Exam  Constitutional: He appears well-developed and well-nourished. He appears distressed.  HENT:  Head: Normocephalic and  atraumatic.  Mouth/Throat: Oropharynx is clear and moist.  Eyes: Right eye exhibits no nystagmus. Left eye exhibits no nystagmus. Right pupil is reactive. Left pupil is not reactive.  Left pupil is round and dilated, but nonreactive to light. Conjunctiva of left eye is diffusely erythematous.   Neck: Normal range of motion. Neck supple.  Cardiovascular: Normal rate, regular rhythm and normal heart sounds. Exam reveals no gallop and no friction rub.  No murmur heard. Pulmonary/Chest: Effort normal and breath sounds normal. No respiratory distress. He has no wheezes. He has no rales.  Abdominal: Soft. He exhibits no distension. There is no tenderness. There is no guarding.  Musculoskeletal: Normal range of motion.  Neurological: He is alert.  Skin: Skin is warm. No rash noted. He is not diaphoretic. No erythema.  Psychiatric: He has a normal mood and affect.  Nursing note and vitals reviewed.  Mental Status:  Patient is alert, but is unable to give a coherent history. Patient is currently nonverbal. Patient is not able to follow 2 step commands without difficulty.  CV: distal pulses palpable throughout    ED Treatments / Results  Labs (all labs ordered are listed, but only abnormal results are displayed) Labs Reviewed  COMPREHENSIVE METABOLIC PANEL - Abnormal; Notable for the following components:      Result Value   Glucose, Bld 119 (*)    Total Protein 8.3 (*)    All other components within normal limits  CBG MONITORING, ED - Abnormal; Notable for the following components:   Glucose-Capillary 104 (*)    All other components within normal limits  I-STAT CHEM 8, ED - Abnormal; Notable for the following components:   Glucose, Bld 115 (*)    All other components within normal limits  CBG MONITORING, ED - Abnormal; Notable for the following components:   Glucose-Capillary 114 (*)    All other components within normal limits  I-STAT TROPONIN, ED  I-STAT CG4 LACTIC ACID, ED     EKG None  Radiology Ct Head Wo Contrast  Result Date: 08/08/2018 CLINICAL DATA:  77 year old male with acute headache. EXAM: CT HEAD WITHOUT CONTRAST TECHNIQUE: Contiguous axial images were obtained from the base of the skull through the vertex without intravenous contrast. COMPARISON:  02/16/2018 CT, 02/13/2018 MR and prior studies FINDINGS: Brain: A large LEFT frontoparietal infarct is noted and most portions appear remote. Remote LEFT basal ganglia and periventricular white matter infarcts are present. A remote RIGHT temporoparietal infarct is present. No hemorrhage, midline shift, mass lesion or extra-axial collection. Vascular: Carotid atherosclerotic calcifications noted. Skull: No acute abnormality. Sinuses/Orbits: No acute abnormality Other: None IMPRESSION: 1. Large LEFT hemispheric infarct which appears remote. Small acute to subacute in these areas are difficult to exclude. Consider MR as clinically indicated. 2. Remote  RIGHT temporoparietal infarct Electronically Signed   By: Harmon PierJeffrey  Hu M.D.   On: 08/08/2018 15:39    Procedures Procedures (including critical care time)  Medications Ordered in ED Medications  ofloxacin (OCUFLOX) 0.3 % ophthalmic solution 1 drop (1 drop Left Eye Not Given 08/08/18 1755)  tetracaine (PONTOCAINE) 0.5 % ophthalmic solution 2 drop (2 drops Left Eye Given 08/08/18 1523)  fluorescein ophthalmic strip 1 strip (1 strip Both Eyes Given by Other 08/08/18 1628)  fluorescein ophthalmic strip 1 strip (1 strip Both Eyes Given by Other 08/08/18 1627)  timolol (TIMOPTIC) 0.5 % ophthalmic solution 1 drop (1 drop Left Eye Given 08/08/18 1624)  acetaZOLAMIDE (DIAMOX) injection 500 mg (500 mg Intravenous Given 08/08/18 1726)  morphine 4 MG/ML injection 4 mg (4 mg Intravenous Given 08/08/18 1727)  sterile water (preservative free) injection (  Given 08/08/18 1729)  prednisoLONE acetate (PRED FORTE) 1 % ophthalmic suspension 2 drop (2 drops Left Eye Given 08/08/18  1729)  sterile water (preservative free) injection 10 mL (10 mLs Injection Given 08/08/18 1728)     Initial Impression / Assessment and Plan / ED Course  I have reviewed the triage vital signs and the nursing notes.  Pertinent labs & imaging results that were available during my care of the patient were reviewed by me and considered in my medical decision making (see chart for details).  Clinical Course as of Aug 09 2031  Mon Aug 08, 2018  1448 Spoke to Doree FudgeLuz on the phone, family friend who is in charge of patient's care. She informs me that patient has been minimally verbal since end of June 2019 after last stroke. Patient has been living in nursing home and she has not seen him for a month. [place] family in the [place], family is in [place]. Patient has had right sided weakness since last stroke and it has not improved. Family friend stated that she will be coming to the ER.   [AH]  1512 Performed tonometry. Left eye measured at 30 and 32. Right eye measured at 18 and 20.    [AH]  1613 Patient has a corneal abrasion on left eye on fluorescein exam.    [AH]    Clinical Course User Index [AH] Leretha DykesHernandez, Aryaan Persichetti P, PA-C   Suspect symptoms are likely due to left eye acute angle closure glaucoma. Patient appears to be having a headache and has a sluggish non responsive to light dilated pupil. Tonometry reveals pressure on left eye is 30 and 32 while pressure on right eye is 18 and 20. Pressure was 38 when Dr. Hyacinth MeekerMiller examined left eye. Provided Timolol to reduce the eye pressure. Provided Ofloxacin for corneal abrasion. Dr. Hyacinth MeekerMiller consulted ophthalmology for further evaluate and treatment. Ophthalmology agreed to evaluate patient and proceed with further management. Opthalmology recommended Pilocarpine, Timolol, Alphagan, Latanoprost, Diamox, and Dorzolamide for treatment. Ophthalmology recommended to discharge patient to care facility and follow up with Dr. Charlotte SanesMcCuen for IOP check tomorrow.  Findings  and plan of care discussed with supervising physician Dr. Hyacinth MeekerMiller who personally evaluated and examined this patient.   Final Clinical Impressions(s) / ED Diagnoses   Final diagnoses:  Acute glaucoma of left eye    ED Discharge Orders         Ordered    dorzolamide (TRUSOPT) 2 % ophthalmic solution  3 times daily     08/08/18 1858    latanoprost (XALATAN) 0.005 % ophthalmic solution  Daily at bedtime     08/08/18 1858  brimonidine (ALPHAGAN) 0.2 % ophthalmic solution  3 times daily     08/08/18 1858    HYDROcodone-acetaminophen (NORCO/VICODIN) 5-325 MG tablet  Every 6 hours PRN     08/08/18 1903           Glade Stanford 08/08/18 2034    Eber Hong, MD 08/09/18 2156

## 2018-08-08 NOTE — ED Provider Notes (Signed)
Medical screening examination/treatment/procedure(s) were conducted as a shared visit with non-physician practitioner(s) and myself.  I personally evaluated the patient during the encounter.  Clinical Impression:   Final diagnoses:  Acute glaucoma of left eye    The patient is a 77 year old male, he has a previous history of a very large stroke, he is currently in a nursing facility where he was found to have a red eye, he appeared to be uncomfortable because of the eye.  He was sent to the emergency department for that reason.  The patient is unable to give any history thus a level 5 caveat applies however on exam the patient does have a poorly reactive left pupil with some erythema of the conjunctive a, on a floor seen stain which I performed under tetracaine anesthesia the patient does have a corneal abrasion on the nasal part of the cornea.  There is no free fluid, his pressure is 38 checked by myself twice.  This is compared to less than 20 on the right side.  The patient's exam is otherwise unremarkable.  I discussed the case with the ophthalmologist on-call, Dr. Charlotte SanesMccuen who will come to see the patient and recommends timolol and ofloxacin.  Appreciate Dr. Charlotte SanesMccuen in her close follow-up of this patient.  Recommendations will be followed and the patient will be sent to the nursing facility as his intraocular pressure is coming down he looks better and more comfortable.   Eber HongMiller, Anaaya Fuster, MD 08/09/18 2156

## 2018-08-08 NOTE — ED Notes (Signed)
[facility] contacted to bring pt back to facility

## 2018-08-10 DIAGNOSIS — H40003 Preglaucoma, unspecified, bilateral: Secondary | ICD-10-CM | POA: Diagnosis not present

## 2018-08-10 DIAGNOSIS — H40212 Acute angle-closure glaucoma, left eye: Secondary | ICD-10-CM | POA: Diagnosis not present

## 2018-08-23 DIAGNOSIS — I739 Peripheral vascular disease, unspecified: Secondary | ICD-10-CM | POA: Diagnosis not present

## 2018-08-23 DIAGNOSIS — Q845 Enlarged and hypertrophic nails: Secondary | ICD-10-CM | POA: Diagnosis not present

## 2018-08-23 DIAGNOSIS — B351 Tinea unguium: Secondary | ICD-10-CM | POA: Diagnosis not present

## 2018-08-23 DIAGNOSIS — L603 Nail dystrophy: Secondary | ICD-10-CM | POA: Diagnosis not present

## 2018-08-25 DIAGNOSIS — R5381 Other malaise: Secondary | ICD-10-CM | POA: Diagnosis not present

## 2018-08-25 DIAGNOSIS — I10 Essential (primary) hypertension: Secondary | ICD-10-CM | POA: Diagnosis not present

## 2018-08-25 DIAGNOSIS — I639 Cerebral infarction, unspecified: Secondary | ICD-10-CM | POA: Diagnosis not present

## 2018-08-25 DIAGNOSIS — F419 Anxiety disorder, unspecified: Secondary | ICD-10-CM | POA: Diagnosis not present

## 2018-09-02 DIAGNOSIS — H401130 Primary open-angle glaucoma, bilateral, stage unspecified: Secondary | ICD-10-CM | POA: Diagnosis not present

## 2018-09-02 DIAGNOSIS — H524 Presbyopia: Secondary | ICD-10-CM | POA: Diagnosis not present

## 2018-09-02 DIAGNOSIS — H2513 Age-related nuclear cataract, bilateral: Secondary | ICD-10-CM | POA: Diagnosis not present

## 2018-09-06 DIAGNOSIS — I10 Essential (primary) hypertension: Secondary | ICD-10-CM | POA: Diagnosis not present

## 2018-09-06 DIAGNOSIS — R5381 Other malaise: Secondary | ICD-10-CM | POA: Diagnosis not present

## 2018-09-06 DIAGNOSIS — I639 Cerebral infarction, unspecified: Secondary | ICD-10-CM | POA: Diagnosis not present

## 2018-09-06 DIAGNOSIS — H409 Unspecified glaucoma: Secondary | ICD-10-CM | POA: Diagnosis not present

## 2018-09-07 DIAGNOSIS — D649 Anemia, unspecified: Secondary | ICD-10-CM | POA: Diagnosis not present

## 2018-09-07 DIAGNOSIS — E119 Type 2 diabetes mellitus without complications: Secondary | ICD-10-CM | POA: Diagnosis not present

## 2018-09-07 DIAGNOSIS — E785 Hyperlipidemia, unspecified: Secondary | ICD-10-CM | POA: Diagnosis not present

## 2018-09-07 DIAGNOSIS — E559 Vitamin D deficiency, unspecified: Secondary | ICD-10-CM | POA: Diagnosis not present

## 2018-09-07 DIAGNOSIS — E039 Hypothyroidism, unspecified: Secondary | ICD-10-CM | POA: Diagnosis not present

## 2018-09-22 DIAGNOSIS — I10 Essential (primary) hypertension: Secondary | ICD-10-CM | POA: Diagnosis not present

## 2018-09-22 DIAGNOSIS — R1310 Dysphagia, unspecified: Secondary | ICD-10-CM | POA: Diagnosis not present

## 2018-09-22 DIAGNOSIS — I639 Cerebral infarction, unspecified: Secondary | ICD-10-CM | POA: Diagnosis not present

## 2018-09-22 DIAGNOSIS — R5381 Other malaise: Secondary | ICD-10-CM | POA: Diagnosis not present

## 2018-10-04 DIAGNOSIS — I639 Cerebral infarction, unspecified: Secondary | ICD-10-CM | POA: Diagnosis not present

## 2018-10-04 DIAGNOSIS — R1310 Dysphagia, unspecified: Secondary | ICD-10-CM | POA: Diagnosis not present

## 2018-10-04 DIAGNOSIS — F419 Anxiety disorder, unspecified: Secondary | ICD-10-CM | POA: Diagnosis not present

## 2018-10-04 DIAGNOSIS — I10 Essential (primary) hypertension: Secondary | ICD-10-CM | POA: Diagnosis not present

## 2018-10-20 DIAGNOSIS — R5381 Other malaise: Secondary | ICD-10-CM | POA: Diagnosis not present

## 2018-10-20 DIAGNOSIS — I639 Cerebral infarction, unspecified: Secondary | ICD-10-CM | POA: Diagnosis not present

## 2018-10-20 DIAGNOSIS — R1310 Dysphagia, unspecified: Secondary | ICD-10-CM | POA: Diagnosis not present

## 2018-10-20 DIAGNOSIS — I10 Essential (primary) hypertension: Secondary | ICD-10-CM | POA: Diagnosis not present

## 2018-11-01 DIAGNOSIS — I10 Essential (primary) hypertension: Secondary | ICD-10-CM | POA: Diagnosis not present

## 2018-11-01 DIAGNOSIS — R5381 Other malaise: Secondary | ICD-10-CM | POA: Diagnosis not present

## 2018-11-01 DIAGNOSIS — I639 Cerebral infarction, unspecified: Secondary | ICD-10-CM | POA: Diagnosis not present

## 2018-11-01 DIAGNOSIS — R1310 Dysphagia, unspecified: Secondary | ICD-10-CM | POA: Diagnosis not present

## 2018-11-08 DIAGNOSIS — M1711 Unilateral primary osteoarthritis, right knee: Secondary | ICD-10-CM | POA: Diagnosis not present

## 2018-11-08 DIAGNOSIS — M1611 Unilateral primary osteoarthritis, right hip: Secondary | ICD-10-CM | POA: Diagnosis not present

## 2018-11-18 DIAGNOSIS — I639 Cerebral infarction, unspecified: Secondary | ICD-10-CM | POA: Diagnosis not present

## 2018-11-18 DIAGNOSIS — I10 Essential (primary) hypertension: Secondary | ICD-10-CM | POA: Diagnosis not present

## 2018-11-18 DIAGNOSIS — R1310 Dysphagia, unspecified: Secondary | ICD-10-CM | POA: Diagnosis not present

## 2018-11-18 DIAGNOSIS — F3341 Major depressive disorder, recurrent, in partial remission: Secondary | ICD-10-CM | POA: Diagnosis not present

## 2018-12-08 DIAGNOSIS — R488 Other symbolic dysfunctions: Secondary | ICD-10-CM | POA: Diagnosis not present

## 2018-12-08 DIAGNOSIS — I69351 Hemiplegia and hemiparesis following cerebral infarction affecting right dominant side: Secondary | ICD-10-CM | POA: Diagnosis not present

## 2018-12-08 DIAGNOSIS — I639 Cerebral infarction, unspecified: Secondary | ICD-10-CM | POA: Diagnosis not present

## 2018-12-08 DIAGNOSIS — R2689 Other abnormalities of gait and mobility: Secondary | ICD-10-CM | POA: Diagnosis not present

## 2018-12-09 DIAGNOSIS — R2689 Other abnormalities of gait and mobility: Secondary | ICD-10-CM | POA: Diagnosis not present

## 2018-12-09 DIAGNOSIS — I69351 Hemiplegia and hemiparesis following cerebral infarction affecting right dominant side: Secondary | ICD-10-CM | POA: Diagnosis not present

## 2018-12-09 DIAGNOSIS — I639 Cerebral infarction, unspecified: Secondary | ICD-10-CM | POA: Diagnosis not present

## 2018-12-09 DIAGNOSIS — R488 Other symbolic dysfunctions: Secondary | ICD-10-CM | POA: Diagnosis not present

## 2018-12-10 DIAGNOSIS — I639 Cerebral infarction, unspecified: Secondary | ICD-10-CM | POA: Diagnosis not present

## 2018-12-10 DIAGNOSIS — I69351 Hemiplegia and hemiparesis following cerebral infarction affecting right dominant side: Secondary | ICD-10-CM | POA: Diagnosis not present

## 2018-12-10 DIAGNOSIS — R488 Other symbolic dysfunctions: Secondary | ICD-10-CM | POA: Diagnosis not present

## 2018-12-10 DIAGNOSIS — R2689 Other abnormalities of gait and mobility: Secondary | ICD-10-CM | POA: Diagnosis not present

## 2018-12-12 DIAGNOSIS — I639 Cerebral infarction, unspecified: Secondary | ICD-10-CM | POA: Diagnosis not present

## 2018-12-12 DIAGNOSIS — R488 Other symbolic dysfunctions: Secondary | ICD-10-CM | POA: Diagnosis not present

## 2018-12-12 DIAGNOSIS — I69351 Hemiplegia and hemiparesis following cerebral infarction affecting right dominant side: Secondary | ICD-10-CM | POA: Diagnosis not present

## 2018-12-12 DIAGNOSIS — R2689 Other abnormalities of gait and mobility: Secondary | ICD-10-CM | POA: Diagnosis not present

## 2018-12-13 DIAGNOSIS — R5381 Other malaise: Secondary | ICD-10-CM | POA: Diagnosis not present

## 2018-12-13 DIAGNOSIS — R2689 Other abnormalities of gait and mobility: Secondary | ICD-10-CM | POA: Diagnosis not present

## 2018-12-13 DIAGNOSIS — R488 Other symbolic dysfunctions: Secondary | ICD-10-CM | POA: Diagnosis not present

## 2018-12-13 DIAGNOSIS — I639 Cerebral infarction, unspecified: Secondary | ICD-10-CM | POA: Diagnosis not present

## 2018-12-13 DIAGNOSIS — I69351 Hemiplegia and hemiparesis following cerebral infarction affecting right dominant side: Secondary | ICD-10-CM | POA: Diagnosis not present

## 2018-12-13 DIAGNOSIS — I10 Essential (primary) hypertension: Secondary | ICD-10-CM | POA: Diagnosis not present

## 2018-12-13 DIAGNOSIS — R1310 Dysphagia, unspecified: Secondary | ICD-10-CM | POA: Diagnosis not present

## 2018-12-15 DIAGNOSIS — R5381 Other malaise: Secondary | ICD-10-CM | POA: Diagnosis not present

## 2018-12-15 DIAGNOSIS — I69351 Hemiplegia and hemiparesis following cerebral infarction affecting right dominant side: Secondary | ICD-10-CM | POA: Diagnosis not present

## 2018-12-15 DIAGNOSIS — R2689 Other abnormalities of gait and mobility: Secondary | ICD-10-CM | POA: Diagnosis not present

## 2018-12-15 DIAGNOSIS — I639 Cerebral infarction, unspecified: Secondary | ICD-10-CM | POA: Diagnosis not present

## 2018-12-15 DIAGNOSIS — R1310 Dysphagia, unspecified: Secondary | ICD-10-CM | POA: Diagnosis not present

## 2018-12-15 DIAGNOSIS — I10 Essential (primary) hypertension: Secondary | ICD-10-CM | POA: Diagnosis not present

## 2018-12-15 DIAGNOSIS — R488 Other symbolic dysfunctions: Secondary | ICD-10-CM | POA: Diagnosis not present

## 2018-12-16 DIAGNOSIS — R2241 Localized swelling, mass and lump, right lower limb: Secondary | ICD-10-CM | POA: Diagnosis not present

## 2018-12-16 DIAGNOSIS — I639 Cerebral infarction, unspecified: Secondary | ICD-10-CM | POA: Diagnosis not present

## 2018-12-16 DIAGNOSIS — I69351 Hemiplegia and hemiparesis following cerebral infarction affecting right dominant side: Secondary | ICD-10-CM | POA: Diagnosis not present

## 2018-12-16 DIAGNOSIS — R488 Other symbolic dysfunctions: Secondary | ICD-10-CM | POA: Diagnosis not present

## 2018-12-16 DIAGNOSIS — R2689 Other abnormalities of gait and mobility: Secondary | ICD-10-CM | POA: Diagnosis not present

## 2018-12-19 DIAGNOSIS — R2689 Other abnormalities of gait and mobility: Secondary | ICD-10-CM | POA: Diagnosis not present

## 2018-12-19 DIAGNOSIS — I69351 Hemiplegia and hemiparesis following cerebral infarction affecting right dominant side: Secondary | ICD-10-CM | POA: Diagnosis not present

## 2018-12-19 DIAGNOSIS — R488 Other symbolic dysfunctions: Secondary | ICD-10-CM | POA: Diagnosis not present

## 2018-12-19 DIAGNOSIS — I639 Cerebral infarction, unspecified: Secondary | ICD-10-CM | POA: Diagnosis not present

## 2018-12-20 DIAGNOSIS — R2689 Other abnormalities of gait and mobility: Secondary | ICD-10-CM | POA: Diagnosis not present

## 2018-12-20 DIAGNOSIS — I69351 Hemiplegia and hemiparesis following cerebral infarction affecting right dominant side: Secondary | ICD-10-CM | POA: Diagnosis not present

## 2018-12-20 DIAGNOSIS — R488 Other symbolic dysfunctions: Secondary | ICD-10-CM | POA: Diagnosis not present

## 2018-12-20 DIAGNOSIS — I639 Cerebral infarction, unspecified: Secondary | ICD-10-CM | POA: Diagnosis not present

## 2018-12-21 DIAGNOSIS — S78112D Complete traumatic amputation at level between left hip and knee, subsequent encounter: Secondary | ICD-10-CM | POA: Diagnosis not present

## 2018-12-21 DIAGNOSIS — T148XXD Other injury of unspecified body region, subsequent encounter: Secondary | ICD-10-CM | POA: Diagnosis not present

## 2018-12-21 DIAGNOSIS — I69351 Hemiplegia and hemiparesis following cerebral infarction affecting right dominant side: Secondary | ICD-10-CM | POA: Diagnosis not present

## 2018-12-21 DIAGNOSIS — R2689 Other abnormalities of gait and mobility: Secondary | ICD-10-CM | POA: Diagnosis not present

## 2018-12-21 DIAGNOSIS — M6281 Muscle weakness (generalized): Secondary | ICD-10-CM | POA: Diagnosis not present

## 2018-12-21 DIAGNOSIS — I639 Cerebral infarction, unspecified: Secondary | ICD-10-CM | POA: Diagnosis not present

## 2018-12-21 DIAGNOSIS — M62442 Contracture of muscle, left hand: Secondary | ICD-10-CM | POA: Diagnosis not present

## 2018-12-21 DIAGNOSIS — M24541 Contracture, right hand: Secondary | ICD-10-CM | POA: Diagnosis not present

## 2018-12-21 DIAGNOSIS — R488 Other symbolic dysfunctions: Secondary | ICD-10-CM | POA: Diagnosis not present

## 2018-12-29 ENCOUNTER — Encounter (HOSPITAL_COMMUNITY): Payer: Self-pay

## 2018-12-29 ENCOUNTER — Other Ambulatory Visit: Payer: Self-pay

## 2018-12-29 ENCOUNTER — Emergency Department (HOSPITAL_COMMUNITY)
Admission: EM | Admit: 2018-12-29 | Discharge: 2018-12-29 | Disposition: A | Discharge status: Home / Self Care | Payer: Medicare Other | Attending: Emergency Medicine | Admitting: Emergency Medicine

## 2018-12-29 DIAGNOSIS — Z7982 Long term (current) use of aspirin: Secondary | ICD-10-CM | POA: Diagnosis not present

## 2018-12-29 DIAGNOSIS — I10 Essential (primary) hypertension: Secondary | ICD-10-CM | POA: Insufficient documentation

## 2018-12-29 DIAGNOSIS — Z7902 Long term (current) use of antithrombotics/antiplatelets: Secondary | ICD-10-CM | POA: Insufficient documentation

## 2018-12-29 DIAGNOSIS — Z79899 Other long term (current) drug therapy: Secondary | ICD-10-CM | POA: Insufficient documentation

## 2018-12-29 DIAGNOSIS — Z7401 Bed confinement status: Secondary | ICD-10-CM | POA: Diagnosis not present

## 2018-12-29 DIAGNOSIS — H5789 Other specified disorders of eye and adnexa: Secondary | ICD-10-CM | POA: Diagnosis present

## 2018-12-29 DIAGNOSIS — R52 Pain, unspecified: Secondary | ICD-10-CM | POA: Diagnosis not present

## 2018-12-29 DIAGNOSIS — H40052 Ocular hypertension, left eye: Secondary | ICD-10-CM | POA: Diagnosis not present

## 2018-12-29 DIAGNOSIS — F0390 Unspecified dementia without behavioral disturbance: Secondary | ICD-10-CM | POA: Diagnosis not present

## 2018-12-29 DIAGNOSIS — R404 Transient alteration of awareness: Secondary | ICD-10-CM | POA: Diagnosis not present

## 2018-12-29 DIAGNOSIS — R402421 Glasgow coma scale score 9-12, in the field [EMT or ambulance]: Secondary | ICD-10-CM | POA: Diagnosis not present

## 2018-12-29 DIAGNOSIS — M2550 Pain in unspecified joint: Secondary | ICD-10-CM | POA: Diagnosis not present

## 2018-12-29 DIAGNOSIS — Z8673 Personal history of transient ischemic attack (TIA), and cerebral infarction without residual deficits: Secondary | ICD-10-CM | POA: Diagnosis not present

## 2018-12-29 DIAGNOSIS — H5710 Ocular pain, unspecified eye: Secondary | ICD-10-CM | POA: Diagnosis not present

## 2018-12-29 DIAGNOSIS — R29898 Other symptoms and signs involving the musculoskeletal system: Secondary | ICD-10-CM | POA: Diagnosis not present

## 2018-12-29 MED ORDER — TETRACAINE HCL 0.5 % OP SOLN
2.0000 [drp] | Freq: Once | OPHTHALMIC | Status: AC
  Administered 2018-12-29: 2 [drp] via OPHTHALMIC
  Filled 2018-12-29: qty 4

## 2018-12-29 MED ORDER — DORZOLAMIDE HCL-TIMOLOL MAL PF 22.3-6.8 MG/ML OP SOLN: 1.0000 [drp] | Freq: Two times a day (BID) | OPHTHALMIC | 0 refills | Status: AC

## 2018-12-29 MED ORDER — LORAZEPAM 2 MG/ML IJ SOLN
1.0000 mg | Freq: Once | INTRAMUSCULAR | Status: AC
  Administered 2018-12-29: 1 mg via INTRAMUSCULAR
  Filled 2018-12-29: qty 1

## 2018-12-29 MED ORDER — PILOCARPINE HCL 1 % OP SOLN: 1.0000 [drp] | Freq: Four times a day (QID) | OPHTHALMIC | 12 refills | Status: AC

## 2018-12-29 MED ORDER — BRIMONIDINE TARTRATE 0.2 % OP SOLN: 1.0000 [drp] | Freq: Two times a day (BID) | OPHTHALMIC | 12 refills | Status: AC

## 2018-12-29 NOTE — ED Notes (Signed)
Attempted to Call report to [facility] at [facility].

## 2018-12-29 NOTE — ED Provider Notes (Signed)
[facility] Provider Note   CSN: [id number] Arrival date & time: 12/29/18  1406    History   Chief Complaint Chief Complaint  Patient presents with  . Glaucoma    HPI Jorge Burns is a 78 y.o. male.     Patient is a 78 year old gentleman coming from nursing home with past medical history of advanced dementia presenting to the emergency department for irritated left eye.  Patient has a history of glaucoma and takes regular eyedrops.  Per nursing home report, due to his dementia he has been combative when they are trying to put the drops in his eye.  Reports that over the last several days he has not had the drops because he is agitated with staff when they try to administer the drops.  Reports that the last couple of days his eye has looked more painful and red and he is squinting.  Patient is nonverbal.     Past Medical History:  Diagnosis Date  . Hypertension   . Stroke [facility](HCC)    "a long time ago -- 2 years ago"    Patient Active Problem List   Diagnosis Date Noted  . Dysphagia, post-stroke   . Essential hypertension   . Acute bilat watershed infarction [facility](HCC) 02/18/2018  . Right hemiparesis (HCC)   . Pulmonary fungal infection   . Diastolic dysfunction   . Prediabetes   . CVA (cerebral vascular accident) (HCC) 02/13/2018  . Language barrier   . Benign essential HTN     Past Surgical History:  Procedure Laterality Date  . IR ANGIO INTRA EXTRACRAN SEL COM CAROTID INNOMINATE BILAT MOD SED  02/15/2018  . IR ANGIO VERTEBRAL SEL SUBCLAVIAN INNOMINATE UNI R MOD SED  02/15/2018  . IR ANGIO VERTEBRAL SEL VERTEBRAL UNI L MOD SED  02/15/2018        Home Medications    Prior to Admission medications   Medication Sig Start Date End Date Taking? Authorizing Provider  aspirin 81 MG chewable tablet Chew 1 tablet (81 mg total) by mouth daily. 02/19/18   Leatha GildingGherghe, Costin M, MD  atorvastatin (LIPITOR) 80 MG tablet Take 1 tablet (80 mg total) by  mouth daily at 6 PM. 04/12/18   Jones Baleshomas, Eunice L, NP  brimonidine (ALPHAGAN) 0.2 % ophthalmic solution Place 1 drop into the left eye 3 (three) times daily. 08/08/18   Eber HongMiller, Brian, MD  citalopram (CELEXA) 10 MG tablet Take 1 tablet (10 mg total) by mouth daily. 03/11/18   Angiulli, Mcarthur Rossettianiel J, PA-C  clopidogrel (PLAVIX) 75 MG tablet Take 1 tablet (75 mg total) by mouth daily. 03/11/18   Angiulli, Mcarthur Rossettianiel J, PA-C  dorzolamide (TRUSOPT) 2 % ophthalmic solution Place 1 drop into the left eye 3 (three) times daily. 08/08/18   Eber HongMiller, Brian, MD  HYDROcodone-acetaminophen (NORCO/VICODIN) 5-325 MG tablet Take 1 tablet by mouth every 6 (six) hours as needed. 08/08/18   Eber HongMiller, Brian, MD  hydrocortisone (ANUSOL-HC) 2.5 % rectal cream Apply rectally 2 times daily 03/22/18   Wieters, Hallie C, PA-C  latanoprost (XALATAN) 0.005 % ophthalmic solution Place 1 drop into the left eye at bedtime. 08/08/18   Eber HongMiller, Brian, MD  lisinopril (PRINIVIL,ZESTRIL) 2.5 MG tablet Take 1 tablet (2.5 mg total) by mouth daily. 03/11/18   Angiulli, Mcarthur Rossettianiel J, PA-C  methylphenidate (RITALIN) 10 MG tablet Take 1 tablet (10 mg total) by mouth 2 (two) times daily with breakfast and lunch. 03/11/18   Angiulli, Mcarthur Rossettianiel J, PA-C  potassium chloride SA (K-DUR,KLOR-CON)  20 MEQ tablet Take 1 tablet (20 mEq total) by mouth daily. 04/08/18   Gilda Crease, MD    Family History History reviewed. No pertinent family history.  Social History Social History   Tobacco Use  . Smoking status: Never Smoker  . Smokeless tobacco: Never Used  Substance Use Topics  . Alcohol use: No  . Drug use: No     Allergies   Patient has no known allergies.   Review of Systems Review of Systems  Unable to perform ROS: Dementia     Physical Exam Updated Vital Signs BP (!) 168/105   Pulse (!) 102   Temp 98.5 F (36.9 C) (Axillary)   Resp (!) 32   SpO2 95%   Physical Exam Vitals signs and nursing note reviewed.  Constitutional:       Appearance: He is well-developed.     Comments: Patient is frail, cachectic appearing and chronically ill-appearing. Prior stroke with contracted right UE  HENT:     Head: Normocephalic and atraumatic.     Nose: Nose normal.     Mouth/Throat:     Mouth: Mucous membranes are moist.  Eyes:     Comments: Patient is squinting the left eye.  The conjunctiva on the left appears red.  The eye is tearful.  Opacified lense. Grossly normal right eye. Difficult exam due to patient not cooperating  Neck:     Musculoskeletal: Neck supple.  Cardiovascular:     Rate and Rhythm: Normal rate and regular rhythm.     Heart sounds: No murmur.  Pulmonary:     Effort: Pulmonary effort is normal.     Breath sounds: Normal breath sounds.  Abdominal:     Palpations: Abdomen is soft.  Skin:    General: Skin is warm and dry.  Neurological:     General: No focal deficit present.     Mental Status: He is alert.  Psychiatric:     Comments: Patient resists me looking in his eye and pushes me away and covers his face. Reportedly at baseline per nursing staff      ED Treatments / Results  Labs (all labs ordered are listed, but only abnormal results are displayed) Labs Reviewed - No data to display  EKG None  Radiology No results found.  Procedures Procedures (including critical care time)  Medications Ordered in ED Medications  LORazepam (ATIVAN) injection 1 mg (has no administration in time range)  tetracaine (PONTOCAINE) 0.5 % ophthalmic solution 2 drop (has no administration in time range)     Initial Impression / Assessment and Plan / ED Course  I have reviewed the triage vital signs and the nursing notes.  Pertinent labs & imaging results that were available during my care of the patient were reviewed by me and considered in my medical decision making (see chart for details).  Clinical Course as of Dec 29 1607  Thu Dec 29, 2018  1547 IOP performed by Dr. Donnald Garre on the R eye. R eye  pressures were 4, ? Accuracy given difficulty of the exam. Patient very combative and needed to be held down by three staff members to perform exam. L eye appears to be chronically deteriorated.    [KM]  1559 Ophthalmology to see the patient. Waiting on consult. Patient passed to [facility] and Dr. Ranae Palms due to change of shift   [KM]    Clinical Course User Index [KM] Arlyn Dunning, PA-C  Final Clinical Impressions(s) / ED Diagnoses   Final diagnoses:  None    ED Discharge Orders    None       Jeral Pinch 12/29/18 1609    Arby Barrette, MD 01/07/19 2125

## 2018-12-29 NOTE — ED Provider Notes (Signed)
Received patient at signout from [facility].  Refer to provider note for full history and physical examination.  Briefly, patient is a 78 year old male with history of severe dementia, essentially nonverbal, and glaucoma presenting for irritation of left eye.  History and physical examination significantly limited due to medical history and dementia.  Elevated IOP of left eye. Awaiting eval by Dr. Dione Booze with ophthalmology.  MDM  5:00PM Patient seen and evaluated by Dr. Dione Booze in the ED. Refer to consult note for evaluation and recommendations. He will see the patient in his office tomorrow for repeat IOP (which has improved in the ED), but ultimately recommends referral to [facility] for longterm management.  He recommends discharge with dorzolamide timolol drops, brimonidine drops, and pilocarpine drops.  He has discussed evaluation and management with myself and Dr. Ranae Palms patient appears reasonably stabilized for discharge back to his facility at this time.      Jeanie Sewer, PA-C 12/29/18 1811    Loren Racer, MD 12/29/18 Serena Croissant

## 2018-12-29 NOTE — ED Notes (Signed)
[facility] called @ 1850-per Paige, RN-called by Marylene Land

## 2018-12-29 NOTE — ED Notes (Signed)
Report given to the nurse at [facility] [place].

## 2018-12-29 NOTE — ED Provider Notes (Signed)
Medical screening examination/treatment/procedure(s) were conducted as a shared visit with non-physician practitioner(s) and myself.  I personally evaluated the patient during the encounter.  None Patient has complex history of prior stroke with combination of dementia and nonverbal with language barrier of [place]  History was obtained from nursing home staff.  Patient reportedly is at baseline for mental status and behavior but has been refusing his eyedrops for glaucoma.  They report that he has been putting his hand up to his eyes and seem to be doing the same signs as when he had an episode of angle-closure glaucoma.  Patient is alert in the room.  He does make vocalizations but they do not really sound like words.  Patient is puts his hand up to his forehead.  He is very difficult to examine due to resistance and cannot cooperate with commands.  Patient has dense right hemiparesis.  Right eye appears grossly normal with intact extraocular motions.  Arcus senilis.  Pupil approximately 3 mm and responsive.  Left eye is moderately injected in the sclera.  Lens appears irregularly opacified and pupil is midrange dilated.  Exam very challenging as patient had to be restrained for examination.  Tono-Pen accuracy questionable on the right eye getting measurements of 4.  Unable to apply to left eye due to patient resistance and forced eye closure.  I do suspect that this has been a indolent and poorly managed underlying condition.  Appears to be a chronicity to the deterioration of the left eye.  Unfortunately, patient does appear to be in pain significant communication barrier on multiple levels.  Per the nursing home report patient also does not communicate any normal fashion with family members either.  Dr. Dione Booze consulted to facilitate exam in the emergency department for recommendations on definitive treatment.     Arby Barrette, MD 12/29/18 1610

## 2018-12-29 NOTE — ED Triage Notes (Signed)
This RN called the facility this pt resides & spoke with [employer] about the pt. It was reported that he keeps refusing his eye gtts (for his glaucoma) d/t his dementia & he is showing signs of his pressures going up just like he did last time it happened, he is squinting and rubbing his eyes, on top of major aggravation when the gtt were initiated before coming to the ED today.

## 2018-12-29 NOTE — ED Notes (Signed)
EDP at bedside  

## 2018-12-29 NOTE — ED Notes (Signed)
Rt eye pressure checked by EDP at bedside now.

## 2018-12-29 NOTE — ED Notes (Signed)
[facility] has been contacted 

## 2018-12-29 NOTE — Discharge Instructions (Addendum)
Go to Dr. Laruth Bouchard office tomorrow for recheck of your eye pressure.  I have attached his offices information.   He does recommend that you eventually follow-up with [facility] ophthalmology for long-term management of your glaucoma.  Apply the dorzolamide-timolol drops one drop to the left eye twice daily, brimonidine drops to left eye twice daily, and pilocarpine 1% one drop to left eye 4 times daily.

## 2018-12-29 NOTE — Consult Note (Signed)
Ophthalmology Initial Consult Note  Jorge Burns, Jorge Burns, 78 y.o. male Date of Service:  12/29/2018  Requesting physician: Loren RacerYelverton, David, MD  Information Obtained from: chart Chief Complaint:  Eye pain  HPI/Discussion:  Jorge Burns is a 78 y.o. male with PMHx of CVA and noncommunicative who was previously seen for presumed acute angle closure OS in 07/2018 presents today in what appears to be significant pain in his left eye. He corroborates this pain by keeping the left eye shut and not letting anyone near it. Per chart review, he was treated medically by Dr. Maris Bergerhristine McCuen [facility]) in 07/2018, but he missed follow-up visit at her office upon discharge.  Past Ocular Hx:  Presumed angle closure OS Ocular Meds:  None Family ocular history: Noncontributory  Past Medical History:  Diagnosis Date  . Hypertension   . Stroke [facility](HCC)    "a long time ago -- 2 years ago"   Past Surgical History:  Procedure Laterality Date  . IR ANGIO INTRA EXTRACRAN SEL COM CAROTID INNOMINATE BILAT MOD SED  02/15/2018  . IR ANGIO VERTEBRAL SEL SUBCLAVIAN INNOMINATE UNI R MOD SED  02/15/2018  . IR ANGIO VERTEBRAL SEL VERTEBRAL UNI L MOD SED  02/15/2018    Prior to Admission Meds: See chart  Inpatient Meds: See chart  No Known Allergies Social History   Tobacco Use  . Smoking status: Never Smoker  . Smokeless tobacco: Never Used  Substance Use Topics  . Alcohol use: No   History reviewed. No pertinent family history.  ROS: Other than ROS in the HPI, all other systems were negative.  Exam: Temp: 98.5 F (36.9 C) Pulse Rate: (!) 102 BP: (!) 168/105 Resp: (!) 32 SpO2: 95 %  Visual Acuity:  [place]   OD Fixes and follows   OS Blinks to light      OD OS  Confr Vis Fields Could not obtain Could not obtain  EOM (Primary) Grossly full by observation Grossly ful by observation  Lids/Lashes Normal Normal  Conjunctiva White, quiet 2+ injection  Adnexa  Normal Normal  Pupils  Round,  reactive 4 mm, fixed, +rAPD by reverse  Cornea  Clear Clear (not cloudy)  Anterior Chamber Formed Formed, shallow  Lens:  NS Mature cataract  IOP Soft to digital palpation Firm to digital palpation, 59 by tonopen with patient squeezing aggressively. 41 by tonopen after administration of timolol and brimonidine.  Fundus - Dilated? 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 - The fact that the cornea is clear suggests that this has likely been going on for several days or weeks, possibly longer. - While the definitive treatment would be cataract extraction, the goal for now will be to temporize him. - For now, will start him on IOP-lowering drops (dorzolamide-timolol OS BID, brimonidine OS BID, and pilocarpine 1% OS QID). - In the hospital, IOP dropped from 59 to 41 with timolol and brimonidine. Above should temporize him until appropriate follow up can be arranged. - I [facility] the [facility] where the appropriate care can be provided. There, they will have the means to perform an exam under anesthesia as deemed necessary, as well as better  arrange for more definitive care. - In the short-term, I would like to see him in my office to check his IOP tomorrow. Hopefully a better exam will be possible when he is more comfortable.  Baldwin Jamaica, MD [phone]  R Fabian Sharp, MD 12/29/2018, 4:46 PM

## 2018-12-29 NOTE — ED Notes (Signed)
Patient verbalizes understanding of discharge instructions. Opportunity for questioning and answers were provided. Armband removed by staff, pt discharged from ED by PTAR. Report given to facility per previous shift RN.

## 2018-12-30 DIAGNOSIS — I639 Cerebral infarction, unspecified: Secondary | ICD-10-CM | POA: Diagnosis not present

## 2018-12-30 DIAGNOSIS — F339 Major depressive disorder, recurrent, unspecified: Secondary | ICD-10-CM | POA: Diagnosis not present

## 2018-12-30 DIAGNOSIS — I69351 Hemiplegia and hemiparesis following cerebral infarction affecting right dominant side: Secondary | ICD-10-CM | POA: Diagnosis not present

## 2018-12-30 DIAGNOSIS — H409 Unspecified glaucoma: Secondary | ICD-10-CM | POA: Diagnosis not present

## 2019-01-06 DIAGNOSIS — I69351 Hemiplegia and hemiparesis following cerebral infarction affecting right dominant side: Secondary | ICD-10-CM | POA: Diagnosis not present

## 2019-01-06 DIAGNOSIS — F339 Major depressive disorder, recurrent, unspecified: Secondary | ICD-10-CM | POA: Diagnosis not present

## 2019-01-06 DIAGNOSIS — I639 Cerebral infarction, unspecified: Secondary | ICD-10-CM | POA: Diagnosis not present

## 2019-01-06 DIAGNOSIS — H409 Unspecified glaucoma: Secondary | ICD-10-CM | POA: Diagnosis not present

## 2019-01-19 DIAGNOSIS — I69351 Hemiplegia and hemiparesis following cerebral infarction affecting right dominant side: Secondary | ICD-10-CM | POA: Diagnosis not present

## 2019-01-19 DIAGNOSIS — I639 Cerebral infarction, unspecified: Secondary | ICD-10-CM | POA: Diagnosis not present

## 2019-01-19 DIAGNOSIS — M62442 Contracture of muscle, left hand: Secondary | ICD-10-CM | POA: Diagnosis not present

## 2019-01-19 DIAGNOSIS — R2689 Other abnormalities of gait and mobility: Secondary | ICD-10-CM | POA: Diagnosis not present

## 2019-01-19 DIAGNOSIS — M6281 Muscle weakness (generalized): Secondary | ICD-10-CM | POA: Diagnosis not present

## 2019-01-19 DIAGNOSIS — M24541 Contracture, right hand: Secondary | ICD-10-CM | POA: Diagnosis not present

## 2019-01-20 DIAGNOSIS — M6281 Muscle weakness (generalized): Secondary | ICD-10-CM | POA: Diagnosis not present

## 2019-01-20 DIAGNOSIS — M62442 Contracture of muscle, left hand: Secondary | ICD-10-CM | POA: Diagnosis not present

## 2019-01-20 DIAGNOSIS — I69351 Hemiplegia and hemiparesis following cerebral infarction affecting right dominant side: Secondary | ICD-10-CM | POA: Diagnosis not present

## 2019-01-20 DIAGNOSIS — I639 Cerebral infarction, unspecified: Secondary | ICD-10-CM | POA: Diagnosis not present

## 2019-01-20 DIAGNOSIS — M24541 Contracture, right hand: Secondary | ICD-10-CM | POA: Diagnosis not present

## 2019-01-23 DIAGNOSIS — M62442 Contracture of muscle, left hand: Secondary | ICD-10-CM | POA: Diagnosis not present

## 2019-01-23 DIAGNOSIS — H409 Unspecified glaucoma: Secondary | ICD-10-CM | POA: Diagnosis not present

## 2019-01-23 DIAGNOSIS — I69351 Hemiplegia and hemiparesis following cerebral infarction affecting right dominant side: Secondary | ICD-10-CM | POA: Diagnosis not present

## 2019-01-23 DIAGNOSIS — M6281 Muscle weakness (generalized): Secondary | ICD-10-CM | POA: Diagnosis not present

## 2019-01-23 DIAGNOSIS — Z9114 Patient's other noncompliance with medication regimen: Secondary | ICD-10-CM | POA: Diagnosis not present

## 2019-01-23 DIAGNOSIS — F809 Developmental disorder of speech and language, unspecified: Secondary | ICD-10-CM | POA: Diagnosis not present

## 2019-01-23 DIAGNOSIS — I10 Essential (primary) hypertension: Secondary | ICD-10-CM | POA: Diagnosis not present

## 2019-01-23 DIAGNOSIS — M24541 Contracture, right hand: Secondary | ICD-10-CM | POA: Diagnosis not present

## 2019-01-23 DIAGNOSIS — I639 Cerebral infarction, unspecified: Secondary | ICD-10-CM | POA: Diagnosis not present

## 2019-01-24 DIAGNOSIS — M24541 Contracture, right hand: Secondary | ICD-10-CM | POA: Diagnosis not present

## 2019-01-24 DIAGNOSIS — M6281 Muscle weakness (generalized): Secondary | ICD-10-CM | POA: Diagnosis not present

## 2019-01-24 DIAGNOSIS — M62442 Contracture of muscle, left hand: Secondary | ICD-10-CM | POA: Diagnosis not present

## 2019-01-24 DIAGNOSIS — I69351 Hemiplegia and hemiparesis following cerebral infarction affecting right dominant side: Secondary | ICD-10-CM | POA: Diagnosis not present

## 2019-01-24 DIAGNOSIS — I639 Cerebral infarction, unspecified: Secondary | ICD-10-CM | POA: Diagnosis not present

## 2019-01-25 DIAGNOSIS — M62442 Contracture of muscle, left hand: Secondary | ICD-10-CM | POA: Diagnosis not present

## 2019-01-25 DIAGNOSIS — I69351 Hemiplegia and hemiparesis following cerebral infarction affecting right dominant side: Secondary | ICD-10-CM | POA: Diagnosis not present

## 2019-01-25 DIAGNOSIS — M6281 Muscle weakness (generalized): Secondary | ICD-10-CM | POA: Diagnosis not present

## 2019-01-25 DIAGNOSIS — M24541 Contracture, right hand: Secondary | ICD-10-CM | POA: Diagnosis not present

## 2019-01-25 DIAGNOSIS — I639 Cerebral infarction, unspecified: Secondary | ICD-10-CM | POA: Diagnosis not present

## 2019-01-26 DIAGNOSIS — I69351 Hemiplegia and hemiparesis following cerebral infarction affecting right dominant side: Secondary | ICD-10-CM | POA: Diagnosis not present

## 2019-01-26 DIAGNOSIS — M62442 Contracture of muscle, left hand: Secondary | ICD-10-CM | POA: Diagnosis not present

## 2019-01-26 DIAGNOSIS — M6281 Muscle weakness (generalized): Secondary | ICD-10-CM | POA: Diagnosis not present

## 2019-01-26 DIAGNOSIS — M24541 Contracture, right hand: Secondary | ICD-10-CM | POA: Diagnosis not present

## 2019-01-26 DIAGNOSIS — I639 Cerebral infarction, unspecified: Secondary | ICD-10-CM | POA: Diagnosis not present

## 2019-01-27 DIAGNOSIS — I69351 Hemiplegia and hemiparesis following cerebral infarction affecting right dominant side: Secondary | ICD-10-CM | POA: Diagnosis not present

## 2019-01-27 DIAGNOSIS — I639 Cerebral infarction, unspecified: Secondary | ICD-10-CM | POA: Diagnosis not present

## 2019-01-27 DIAGNOSIS — M6281 Muscle weakness (generalized): Secondary | ICD-10-CM | POA: Diagnosis not present

## 2019-01-27 DIAGNOSIS — I10 Essential (primary) hypertension: Secondary | ICD-10-CM | POA: Diagnosis not present

## 2019-01-27 DIAGNOSIS — M62442 Contracture of muscle, left hand: Secondary | ICD-10-CM | POA: Diagnosis not present

## 2019-01-27 DIAGNOSIS — M24541 Contracture, right hand: Secondary | ICD-10-CM | POA: Diagnosis not present

## 2019-01-30 DIAGNOSIS — M62442 Contracture of muscle, left hand: Secondary | ICD-10-CM | POA: Diagnosis not present

## 2019-01-30 DIAGNOSIS — I639 Cerebral infarction, unspecified: Secondary | ICD-10-CM | POA: Diagnosis not present

## 2019-01-30 DIAGNOSIS — I10 Essential (primary) hypertension: Secondary | ICD-10-CM | POA: Diagnosis not present

## 2019-01-30 DIAGNOSIS — H409 Unspecified glaucoma: Secondary | ICD-10-CM | POA: Diagnosis not present

## 2019-01-30 DIAGNOSIS — M6281 Muscle weakness (generalized): Secondary | ICD-10-CM | POA: Diagnosis not present

## 2019-01-30 DIAGNOSIS — I69351 Hemiplegia and hemiparesis following cerebral infarction affecting right dominant side: Secondary | ICD-10-CM | POA: Diagnosis not present

## 2019-01-30 DIAGNOSIS — M24541 Contracture, right hand: Secondary | ICD-10-CM | POA: Diagnosis not present

## 2019-01-31 DIAGNOSIS — M6281 Muscle weakness (generalized): Secondary | ICD-10-CM | POA: Diagnosis not present

## 2019-01-31 DIAGNOSIS — M24541 Contracture, right hand: Secondary | ICD-10-CM | POA: Diagnosis not present

## 2019-01-31 DIAGNOSIS — I639 Cerebral infarction, unspecified: Secondary | ICD-10-CM | POA: Diagnosis not present

## 2019-01-31 DIAGNOSIS — I69351 Hemiplegia and hemiparesis following cerebral infarction affecting right dominant side: Secondary | ICD-10-CM | POA: Diagnosis not present

## 2019-01-31 DIAGNOSIS — M62442 Contracture of muscle, left hand: Secondary | ICD-10-CM | POA: Diagnosis not present

## 2019-02-01 DIAGNOSIS — M62442 Contracture of muscle, left hand: Secondary | ICD-10-CM | POA: Diagnosis not present

## 2019-02-01 DIAGNOSIS — M24541 Contracture, right hand: Secondary | ICD-10-CM | POA: Diagnosis not present

## 2019-02-01 DIAGNOSIS — M6281 Muscle weakness (generalized): Secondary | ICD-10-CM | POA: Diagnosis not present

## 2019-02-01 DIAGNOSIS — I639 Cerebral infarction, unspecified: Secondary | ICD-10-CM | POA: Diagnosis not present

## 2019-02-01 DIAGNOSIS — I69351 Hemiplegia and hemiparesis following cerebral infarction affecting right dominant side: Secondary | ICD-10-CM | POA: Diagnosis not present

## 2019-02-02 DIAGNOSIS — I639 Cerebral infarction, unspecified: Secondary | ICD-10-CM | POA: Diagnosis not present

## 2019-02-02 DIAGNOSIS — M62442 Contracture of muscle, left hand: Secondary | ICD-10-CM | POA: Diagnosis not present

## 2019-02-02 DIAGNOSIS — M24541 Contracture, right hand: Secondary | ICD-10-CM | POA: Diagnosis not present

## 2019-02-02 DIAGNOSIS — M6281 Muscle weakness (generalized): Secondary | ICD-10-CM | POA: Diagnosis not present

## 2019-02-02 DIAGNOSIS — I69351 Hemiplegia and hemiparesis following cerebral infarction affecting right dominant side: Secondary | ICD-10-CM | POA: Diagnosis not present

## 2019-02-03 DIAGNOSIS — M24541 Contracture, right hand: Secondary | ICD-10-CM | POA: Diagnosis not present

## 2019-02-03 DIAGNOSIS — I10 Essential (primary) hypertension: Secondary | ICD-10-CM | POA: Diagnosis not present

## 2019-02-03 DIAGNOSIS — M62442 Contracture of muscle, left hand: Secondary | ICD-10-CM | POA: Diagnosis not present

## 2019-02-03 DIAGNOSIS — Z9114 Patient's other noncompliance with medication regimen: Secondary | ICD-10-CM | POA: Diagnosis not present

## 2019-02-03 DIAGNOSIS — M6281 Muscle weakness (generalized): Secondary | ICD-10-CM | POA: Diagnosis not present

## 2019-02-03 DIAGNOSIS — I639 Cerebral infarction, unspecified: Secondary | ICD-10-CM | POA: Diagnosis not present

## 2019-02-03 DIAGNOSIS — H409 Unspecified glaucoma: Secondary | ICD-10-CM | POA: Diagnosis not present

## 2019-02-03 DIAGNOSIS — I69351 Hemiplegia and hemiparesis following cerebral infarction affecting right dominant side: Secondary | ICD-10-CM | POA: Diagnosis not present

## 2019-02-06 DIAGNOSIS — M24541 Contracture, right hand: Secondary | ICD-10-CM | POA: Diagnosis not present

## 2019-02-06 DIAGNOSIS — M6281 Muscle weakness (generalized): Secondary | ICD-10-CM | POA: Diagnosis not present

## 2019-02-06 DIAGNOSIS — I639 Cerebral infarction, unspecified: Secondary | ICD-10-CM | POA: Diagnosis not present

## 2019-02-06 DIAGNOSIS — M62442 Contracture of muscle, left hand: Secondary | ICD-10-CM | POA: Diagnosis not present

## 2019-02-06 DIAGNOSIS — I69351 Hemiplegia and hemiparesis following cerebral infarction affecting right dominant side: Secondary | ICD-10-CM | POA: Diagnosis not present

## 2019-02-07 DIAGNOSIS — I69351 Hemiplegia and hemiparesis following cerebral infarction affecting right dominant side: Secondary | ICD-10-CM | POA: Diagnosis not present

## 2019-02-07 DIAGNOSIS — I639 Cerebral infarction, unspecified: Secondary | ICD-10-CM | POA: Diagnosis not present

## 2019-02-07 DIAGNOSIS — M24541 Contracture, right hand: Secondary | ICD-10-CM | POA: Diagnosis not present

## 2019-02-07 DIAGNOSIS — M6281 Muscle weakness (generalized): Secondary | ICD-10-CM | POA: Diagnosis not present

## 2019-02-07 DIAGNOSIS — M62442 Contracture of muscle, left hand: Secondary | ICD-10-CM | POA: Diagnosis not present

## 2019-02-08 DIAGNOSIS — I69351 Hemiplegia and hemiparesis following cerebral infarction affecting right dominant side: Secondary | ICD-10-CM | POA: Diagnosis not present

## 2019-02-08 DIAGNOSIS — M62442 Contracture of muscle, left hand: Secondary | ICD-10-CM | POA: Diagnosis not present

## 2019-02-08 DIAGNOSIS — M24541 Contracture, right hand: Secondary | ICD-10-CM | POA: Diagnosis not present

## 2019-02-08 DIAGNOSIS — M6281 Muscle weakness (generalized): Secondary | ICD-10-CM | POA: Diagnosis not present

## 2019-02-08 DIAGNOSIS — I639 Cerebral infarction, unspecified: Secondary | ICD-10-CM | POA: Diagnosis not present

## 2019-02-09 DIAGNOSIS — I69351 Hemiplegia and hemiparesis following cerebral infarction affecting right dominant side: Secondary | ICD-10-CM | POA: Diagnosis not present

## 2019-02-09 DIAGNOSIS — M24541 Contracture, right hand: Secondary | ICD-10-CM | POA: Diagnosis not present

## 2019-02-09 DIAGNOSIS — M62442 Contracture of muscle, left hand: Secondary | ICD-10-CM | POA: Diagnosis not present

## 2019-02-09 DIAGNOSIS — I639 Cerebral infarction, unspecified: Secondary | ICD-10-CM | POA: Diagnosis not present

## 2019-02-09 DIAGNOSIS — M6281 Muscle weakness (generalized): Secondary | ICD-10-CM | POA: Diagnosis not present

## 2019-02-10 DIAGNOSIS — I639 Cerebral infarction, unspecified: Secondary | ICD-10-CM | POA: Diagnosis not present

## 2019-02-10 DIAGNOSIS — F339 Major depressive disorder, recurrent, unspecified: Secondary | ICD-10-CM | POA: Diagnosis not present

## 2019-02-10 DIAGNOSIS — M62442 Contracture of muscle, left hand: Secondary | ICD-10-CM | POA: Diagnosis not present

## 2019-02-10 DIAGNOSIS — M24541 Contracture, right hand: Secondary | ICD-10-CM | POA: Diagnosis not present

## 2019-02-10 DIAGNOSIS — I69351 Hemiplegia and hemiparesis following cerebral infarction affecting right dominant side: Secondary | ICD-10-CM | POA: Diagnosis not present

## 2019-02-10 DIAGNOSIS — F809 Developmental disorder of speech and language, unspecified: Secondary | ICD-10-CM | POA: Diagnosis not present

## 2019-02-10 DIAGNOSIS — M6281 Muscle weakness (generalized): Secondary | ICD-10-CM | POA: Diagnosis not present

## 2019-02-13 DIAGNOSIS — M24541 Contracture, right hand: Secondary | ICD-10-CM | POA: Diagnosis not present

## 2019-02-13 DIAGNOSIS — M6281 Muscle weakness (generalized): Secondary | ICD-10-CM | POA: Diagnosis not present

## 2019-02-13 DIAGNOSIS — I69351 Hemiplegia and hemiparesis following cerebral infarction affecting right dominant side: Secondary | ICD-10-CM | POA: Diagnosis not present

## 2019-02-13 DIAGNOSIS — I639 Cerebral infarction, unspecified: Secondary | ICD-10-CM | POA: Diagnosis not present

## 2019-02-13 DIAGNOSIS — M62442 Contracture of muscle, left hand: Secondary | ICD-10-CM | POA: Diagnosis not present

## 2019-02-14 DIAGNOSIS — M24541 Contracture, right hand: Secondary | ICD-10-CM | POA: Diagnosis not present

## 2019-02-14 DIAGNOSIS — M62442 Contracture of muscle, left hand: Secondary | ICD-10-CM | POA: Diagnosis not present

## 2019-02-14 DIAGNOSIS — I69351 Hemiplegia and hemiparesis following cerebral infarction affecting right dominant side: Secondary | ICD-10-CM | POA: Diagnosis not present

## 2019-02-14 DIAGNOSIS — I639 Cerebral infarction, unspecified: Secondary | ICD-10-CM | POA: Diagnosis not present

## 2019-02-14 DIAGNOSIS — M6281 Muscle weakness (generalized): Secondary | ICD-10-CM | POA: Diagnosis not present

## 2019-02-15 DIAGNOSIS — M24541 Contracture, right hand: Secondary | ICD-10-CM | POA: Diagnosis not present

## 2019-02-15 DIAGNOSIS — M6281 Muscle weakness (generalized): Secondary | ICD-10-CM | POA: Diagnosis not present

## 2019-02-15 DIAGNOSIS — I69351 Hemiplegia and hemiparesis following cerebral infarction affecting right dominant side: Secondary | ICD-10-CM | POA: Diagnosis not present

## 2019-02-15 DIAGNOSIS — I639 Cerebral infarction, unspecified: Secondary | ICD-10-CM | POA: Diagnosis not present

## 2019-02-15 DIAGNOSIS — M62442 Contracture of muscle, left hand: Secondary | ICD-10-CM | POA: Diagnosis not present

## 2019-02-16 DIAGNOSIS — M62442 Contracture of muscle, left hand: Secondary | ICD-10-CM | POA: Diagnosis not present

## 2019-02-16 DIAGNOSIS — M24541 Contracture, right hand: Secondary | ICD-10-CM | POA: Diagnosis not present

## 2019-02-16 DIAGNOSIS — M6281 Muscle weakness (generalized): Secondary | ICD-10-CM | POA: Diagnosis not present

## 2019-02-16 DIAGNOSIS — I639 Cerebral infarction, unspecified: Secondary | ICD-10-CM | POA: Diagnosis not present

## 2019-02-16 DIAGNOSIS — I69351 Hemiplegia and hemiparesis following cerebral infarction affecting right dominant side: Secondary | ICD-10-CM | POA: Diagnosis not present

## 2019-02-17 DIAGNOSIS — M24541 Contracture, right hand: Secondary | ICD-10-CM | POA: Diagnosis not present

## 2019-02-17 DIAGNOSIS — I639 Cerebral infarction, unspecified: Secondary | ICD-10-CM | POA: Diagnosis not present

## 2019-02-17 DIAGNOSIS — I69351 Hemiplegia and hemiparesis following cerebral infarction affecting right dominant side: Secondary | ICD-10-CM | POA: Diagnosis not present

## 2019-02-17 DIAGNOSIS — M6281 Muscle weakness (generalized): Secondary | ICD-10-CM | POA: Diagnosis not present

## 2019-02-17 DIAGNOSIS — M62442 Contracture of muscle, left hand: Secondary | ICD-10-CM | POA: Diagnosis not present

## 2019-02-20 DIAGNOSIS — M6281 Muscle weakness (generalized): Secondary | ICD-10-CM | POA: Diagnosis not present

## 2019-02-20 DIAGNOSIS — M62442 Contracture of muscle, left hand: Secondary | ICD-10-CM | POA: Diagnosis not present

## 2019-02-20 DIAGNOSIS — M24541 Contracture, right hand: Secondary | ICD-10-CM | POA: Diagnosis not present

## 2019-02-20 DIAGNOSIS — I69351 Hemiplegia and hemiparesis following cerebral infarction affecting right dominant side: Secondary | ICD-10-CM | POA: Diagnosis not present

## 2019-02-20 DIAGNOSIS — Z741 Need for assistance with personal care: Secondary | ICD-10-CM | POA: Diagnosis not present

## 2019-02-20 DIAGNOSIS — I639 Cerebral infarction, unspecified: Secondary | ICD-10-CM | POA: Diagnosis not present

## 2019-02-21 DIAGNOSIS — Z741 Need for assistance with personal care: Secondary | ICD-10-CM | POA: Diagnosis not present

## 2019-02-21 DIAGNOSIS — M24541 Contracture, right hand: Secondary | ICD-10-CM | POA: Diagnosis not present

## 2019-02-21 DIAGNOSIS — M6281 Muscle weakness (generalized): Secondary | ICD-10-CM | POA: Diagnosis not present

## 2019-02-21 DIAGNOSIS — I639 Cerebral infarction, unspecified: Secondary | ICD-10-CM | POA: Diagnosis not present

## 2019-02-21 DIAGNOSIS — I69351 Hemiplegia and hemiparesis following cerebral infarction affecting right dominant side: Secondary | ICD-10-CM | POA: Diagnosis not present

## 2019-02-21 DIAGNOSIS — M62442 Contracture of muscle, left hand: Secondary | ICD-10-CM | POA: Diagnosis not present

## 2019-02-22 DIAGNOSIS — M62442 Contracture of muscle, left hand: Secondary | ICD-10-CM | POA: Diagnosis not present

## 2019-02-22 DIAGNOSIS — Z741 Need for assistance with personal care: Secondary | ICD-10-CM | POA: Diagnosis not present

## 2019-02-22 DIAGNOSIS — I639 Cerebral infarction, unspecified: Secondary | ICD-10-CM | POA: Diagnosis not present

## 2019-02-22 DIAGNOSIS — M6281 Muscle weakness (generalized): Secondary | ICD-10-CM | POA: Diagnosis not present

## 2019-02-22 DIAGNOSIS — I69351 Hemiplegia and hemiparesis following cerebral infarction affecting right dominant side: Secondary | ICD-10-CM | POA: Diagnosis not present

## 2019-02-22 DIAGNOSIS — M24541 Contracture, right hand: Secondary | ICD-10-CM | POA: Diagnosis not present

## 2019-02-23 DIAGNOSIS — M24541 Contracture, right hand: Secondary | ICD-10-CM | POA: Diagnosis not present

## 2019-02-23 DIAGNOSIS — Z741 Need for assistance with personal care: Secondary | ICD-10-CM | POA: Diagnosis not present

## 2019-02-23 DIAGNOSIS — I69351 Hemiplegia and hemiparesis following cerebral infarction affecting right dominant side: Secondary | ICD-10-CM | POA: Diagnosis not present

## 2019-02-23 DIAGNOSIS — M62442 Contracture of muscle, left hand: Secondary | ICD-10-CM | POA: Diagnosis not present

## 2019-02-23 DIAGNOSIS — M6281 Muscle weakness (generalized): Secondary | ICD-10-CM | POA: Diagnosis not present

## 2019-02-23 DIAGNOSIS — I639 Cerebral infarction, unspecified: Secondary | ICD-10-CM | POA: Diagnosis not present

## 2019-02-24 DIAGNOSIS — M62442 Contracture of muscle, left hand: Secondary | ICD-10-CM | POA: Diagnosis not present

## 2019-02-24 DIAGNOSIS — I639 Cerebral infarction, unspecified: Secondary | ICD-10-CM | POA: Diagnosis not present

## 2019-02-24 DIAGNOSIS — I69351 Hemiplegia and hemiparesis following cerebral infarction affecting right dominant side: Secondary | ICD-10-CM | POA: Diagnosis not present

## 2019-02-24 DIAGNOSIS — M24541 Contracture, right hand: Secondary | ICD-10-CM | POA: Diagnosis not present

## 2019-02-24 DIAGNOSIS — M6281 Muscle weakness (generalized): Secondary | ICD-10-CM | POA: Diagnosis not present

## 2019-02-24 DIAGNOSIS — Z741 Need for assistance with personal care: Secondary | ICD-10-CM | POA: Diagnosis not present

## 2019-02-27 DIAGNOSIS — M62442 Contracture of muscle, left hand: Secondary | ICD-10-CM | POA: Diagnosis not present

## 2019-02-27 DIAGNOSIS — I69351 Hemiplegia and hemiparesis following cerebral infarction affecting right dominant side: Secondary | ICD-10-CM | POA: Diagnosis not present

## 2019-02-27 DIAGNOSIS — I639 Cerebral infarction, unspecified: Secondary | ICD-10-CM | POA: Diagnosis not present

## 2019-02-27 DIAGNOSIS — Z741 Need for assistance with personal care: Secondary | ICD-10-CM | POA: Diagnosis not present

## 2019-02-27 DIAGNOSIS — M24541 Contracture, right hand: Secondary | ICD-10-CM | POA: Diagnosis not present

## 2019-02-27 DIAGNOSIS — M6281 Muscle weakness (generalized): Secondary | ICD-10-CM | POA: Diagnosis not present

## 2019-02-28 DIAGNOSIS — I69351 Hemiplegia and hemiparesis following cerebral infarction affecting right dominant side: Secondary | ICD-10-CM | POA: Diagnosis not present

## 2019-02-28 DIAGNOSIS — Z741 Need for assistance with personal care: Secondary | ICD-10-CM | POA: Diagnosis not present

## 2019-02-28 DIAGNOSIS — M62442 Contracture of muscle, left hand: Secondary | ICD-10-CM | POA: Diagnosis not present

## 2019-02-28 DIAGNOSIS — M6281 Muscle weakness (generalized): Secondary | ICD-10-CM | POA: Diagnosis not present

## 2019-02-28 DIAGNOSIS — M24541 Contracture, right hand: Secondary | ICD-10-CM | POA: Diagnosis not present

## 2019-02-28 DIAGNOSIS — I639 Cerebral infarction, unspecified: Secondary | ICD-10-CM | POA: Diagnosis not present

## 2019-03-01 DIAGNOSIS — M6281 Muscle weakness (generalized): Secondary | ICD-10-CM | POA: Diagnosis not present

## 2019-03-01 DIAGNOSIS — I639 Cerebral infarction, unspecified: Secondary | ICD-10-CM | POA: Diagnosis not present

## 2019-03-01 DIAGNOSIS — Z741 Need for assistance with personal care: Secondary | ICD-10-CM | POA: Diagnosis not present

## 2019-03-01 DIAGNOSIS — M62442 Contracture of muscle, left hand: Secondary | ICD-10-CM | POA: Diagnosis not present

## 2019-03-01 DIAGNOSIS — M24541 Contracture, right hand: Secondary | ICD-10-CM | POA: Diagnosis not present

## 2019-03-01 DIAGNOSIS — I69351 Hemiplegia and hemiparesis following cerebral infarction affecting right dominant side: Secondary | ICD-10-CM | POA: Diagnosis not present

## 2019-03-02 DIAGNOSIS — M24541 Contracture, right hand: Secondary | ICD-10-CM | POA: Diagnosis not present

## 2019-03-02 DIAGNOSIS — R52 Pain, unspecified: Secondary | ICD-10-CM | POA: Diagnosis not present

## 2019-03-02 DIAGNOSIS — I679 Cerebrovascular disease, unspecified: Secondary | ICD-10-CM | POA: Diagnosis not present

## 2019-03-02 DIAGNOSIS — Z741 Need for assistance with personal care: Secondary | ICD-10-CM | POA: Diagnosis not present

## 2019-03-02 DIAGNOSIS — I639 Cerebral infarction, unspecified: Secondary | ICD-10-CM | POA: Diagnosis not present

## 2019-03-02 DIAGNOSIS — F329 Major depressive disorder, single episode, unspecified: Secondary | ICD-10-CM | POA: Diagnosis not present

## 2019-03-02 DIAGNOSIS — M6281 Muscle weakness (generalized): Secondary | ICD-10-CM | POA: Diagnosis not present

## 2019-03-02 DIAGNOSIS — I69351 Hemiplegia and hemiparesis following cerebral infarction affecting right dominant side: Secondary | ICD-10-CM | POA: Diagnosis not present

## 2019-03-02 DIAGNOSIS — M62442 Contracture of muscle, left hand: Secondary | ICD-10-CM | POA: Diagnosis not present

## 2019-03-03 DIAGNOSIS — I639 Cerebral infarction, unspecified: Secondary | ICD-10-CM | POA: Diagnosis not present

## 2019-03-03 DIAGNOSIS — M24541 Contracture, right hand: Secondary | ICD-10-CM | POA: Diagnosis not present

## 2019-03-03 DIAGNOSIS — Z741 Need for assistance with personal care: Secondary | ICD-10-CM | POA: Diagnosis not present

## 2019-03-03 DIAGNOSIS — M6281 Muscle weakness (generalized): Secondary | ICD-10-CM | POA: Diagnosis not present

## 2019-03-03 DIAGNOSIS — M62442 Contracture of muscle, left hand: Secondary | ICD-10-CM | POA: Diagnosis not present

## 2019-03-03 DIAGNOSIS — I69351 Hemiplegia and hemiparesis following cerebral infarction affecting right dominant side: Secondary | ICD-10-CM | POA: Diagnosis not present

## 2019-03-06 DIAGNOSIS — M62442 Contracture of muscle, left hand: Secondary | ICD-10-CM | POA: Diagnosis not present

## 2019-03-06 DIAGNOSIS — I69351 Hemiplegia and hemiparesis following cerebral infarction affecting right dominant side: Secondary | ICD-10-CM | POA: Diagnosis not present

## 2019-03-06 DIAGNOSIS — M6281 Muscle weakness (generalized): Secondary | ICD-10-CM | POA: Diagnosis not present

## 2019-03-06 DIAGNOSIS — M24541 Contracture, right hand: Secondary | ICD-10-CM | POA: Diagnosis not present

## 2019-03-06 DIAGNOSIS — I639 Cerebral infarction, unspecified: Secondary | ICD-10-CM | POA: Diagnosis not present

## 2019-03-06 DIAGNOSIS — Z741 Need for assistance with personal care: Secondary | ICD-10-CM | POA: Diagnosis not present

## 2019-03-07 DIAGNOSIS — M62442 Contracture of muscle, left hand: Secondary | ICD-10-CM | POA: Diagnosis not present

## 2019-03-07 DIAGNOSIS — M24541 Contracture, right hand: Secondary | ICD-10-CM | POA: Diagnosis not present

## 2019-03-07 DIAGNOSIS — M6281 Muscle weakness (generalized): Secondary | ICD-10-CM | POA: Diagnosis not present

## 2019-03-07 DIAGNOSIS — I69351 Hemiplegia and hemiparesis following cerebral infarction affecting right dominant side: Secondary | ICD-10-CM | POA: Diagnosis not present

## 2019-03-07 DIAGNOSIS — I639 Cerebral infarction, unspecified: Secondary | ICD-10-CM | POA: Diagnosis not present

## 2019-03-07 DIAGNOSIS — Z741 Need for assistance with personal care: Secondary | ICD-10-CM | POA: Diagnosis not present

## 2019-03-08 DIAGNOSIS — M62442 Contracture of muscle, left hand: Secondary | ICD-10-CM | POA: Diagnosis not present

## 2019-03-08 DIAGNOSIS — M6281 Muscle weakness (generalized): Secondary | ICD-10-CM | POA: Diagnosis not present

## 2019-03-08 DIAGNOSIS — I69351 Hemiplegia and hemiparesis following cerebral infarction affecting right dominant side: Secondary | ICD-10-CM | POA: Diagnosis not present

## 2019-03-08 DIAGNOSIS — Z741 Need for assistance with personal care: Secondary | ICD-10-CM | POA: Diagnosis not present

## 2019-03-08 DIAGNOSIS — M24541 Contracture, right hand: Secondary | ICD-10-CM | POA: Diagnosis not present

## 2019-03-08 DIAGNOSIS — I639 Cerebral infarction, unspecified: Secondary | ICD-10-CM | POA: Diagnosis not present

## 2019-03-09 DIAGNOSIS — I639 Cerebral infarction, unspecified: Secondary | ICD-10-CM | POA: Diagnosis not present

## 2019-03-09 DIAGNOSIS — M62442 Contracture of muscle, left hand: Secondary | ICD-10-CM | POA: Diagnosis not present

## 2019-03-09 DIAGNOSIS — I69351 Hemiplegia and hemiparesis following cerebral infarction affecting right dominant side: Secondary | ICD-10-CM | POA: Diagnosis not present

## 2019-03-09 DIAGNOSIS — M6281 Muscle weakness (generalized): Secondary | ICD-10-CM | POA: Diagnosis not present

## 2019-03-09 DIAGNOSIS — Z741 Need for assistance with personal care: Secondary | ICD-10-CM | POA: Diagnosis not present

## 2019-03-09 DIAGNOSIS — M24541 Contracture, right hand: Secondary | ICD-10-CM | POA: Diagnosis not present

## 2019-03-10 DIAGNOSIS — M6281 Muscle weakness (generalized): Secondary | ICD-10-CM | POA: Diagnosis not present

## 2019-03-10 DIAGNOSIS — M24541 Contracture, right hand: Secondary | ICD-10-CM | POA: Diagnosis not present

## 2019-03-10 DIAGNOSIS — M62442 Contracture of muscle, left hand: Secondary | ICD-10-CM | POA: Diagnosis not present

## 2019-03-10 DIAGNOSIS — F329 Major depressive disorder, single episode, unspecified: Secondary | ICD-10-CM | POA: Diagnosis not present

## 2019-03-10 DIAGNOSIS — R52 Pain, unspecified: Secondary | ICD-10-CM | POA: Diagnosis not present

## 2019-03-10 DIAGNOSIS — I679 Cerebrovascular disease, unspecified: Secondary | ICD-10-CM | POA: Diagnosis not present

## 2019-03-10 DIAGNOSIS — I69351 Hemiplegia and hemiparesis following cerebral infarction affecting right dominant side: Secondary | ICD-10-CM | POA: Diagnosis not present

## 2019-03-10 DIAGNOSIS — Z741 Need for assistance with personal care: Secondary | ICD-10-CM | POA: Diagnosis not present

## 2019-03-10 DIAGNOSIS — I639 Cerebral infarction, unspecified: Secondary | ICD-10-CM | POA: Diagnosis not present

## 2019-03-13 DIAGNOSIS — I69351 Hemiplegia and hemiparesis following cerebral infarction affecting right dominant side: Secondary | ICD-10-CM | POA: Diagnosis not present

## 2019-03-13 DIAGNOSIS — Z741 Need for assistance with personal care: Secondary | ICD-10-CM | POA: Diagnosis not present

## 2019-03-13 DIAGNOSIS — M6281 Muscle weakness (generalized): Secondary | ICD-10-CM | POA: Diagnosis not present

## 2019-03-13 DIAGNOSIS — M62442 Contracture of muscle, left hand: Secondary | ICD-10-CM | POA: Diagnosis not present

## 2019-03-13 DIAGNOSIS — M24541 Contracture, right hand: Secondary | ICD-10-CM | POA: Diagnosis not present

## 2019-03-13 DIAGNOSIS — I639 Cerebral infarction, unspecified: Secondary | ICD-10-CM | POA: Diagnosis not present

## 2019-03-14 DIAGNOSIS — M62442 Contracture of muscle, left hand: Secondary | ICD-10-CM | POA: Diagnosis not present

## 2019-03-14 DIAGNOSIS — Z741 Need for assistance with personal care: Secondary | ICD-10-CM | POA: Diagnosis not present

## 2019-03-14 DIAGNOSIS — M6281 Muscle weakness (generalized): Secondary | ICD-10-CM | POA: Diagnosis not present

## 2019-03-14 DIAGNOSIS — I69351 Hemiplegia and hemiparesis following cerebral infarction affecting right dominant side: Secondary | ICD-10-CM | POA: Diagnosis not present

## 2019-03-14 DIAGNOSIS — M24541 Contracture, right hand: Secondary | ICD-10-CM | POA: Diagnosis not present

## 2019-03-14 DIAGNOSIS — I639 Cerebral infarction, unspecified: Secondary | ICD-10-CM | POA: Diagnosis not present

## 2019-03-15 DIAGNOSIS — M62442 Contracture of muscle, left hand: Secondary | ICD-10-CM | POA: Diagnosis not present

## 2019-03-15 DIAGNOSIS — Z741 Need for assistance with personal care: Secondary | ICD-10-CM | POA: Diagnosis not present

## 2019-03-15 DIAGNOSIS — M6281 Muscle weakness (generalized): Secondary | ICD-10-CM | POA: Diagnosis not present

## 2019-03-15 DIAGNOSIS — I69351 Hemiplegia and hemiparesis following cerebral infarction affecting right dominant side: Secondary | ICD-10-CM | POA: Diagnosis not present

## 2019-03-15 DIAGNOSIS — I639 Cerebral infarction, unspecified: Secondary | ICD-10-CM | POA: Diagnosis not present

## 2019-03-15 DIAGNOSIS — M24541 Contracture, right hand: Secondary | ICD-10-CM | POA: Diagnosis not present

## 2019-03-16 DIAGNOSIS — Z741 Need for assistance with personal care: Secondary | ICD-10-CM | POA: Diagnosis not present

## 2019-03-16 DIAGNOSIS — I69351 Hemiplegia and hemiparesis following cerebral infarction affecting right dominant side: Secondary | ICD-10-CM | POA: Diagnosis not present

## 2019-03-16 DIAGNOSIS — M24541 Contracture, right hand: Secondary | ICD-10-CM | POA: Diagnosis not present

## 2019-03-16 DIAGNOSIS — M62442 Contracture of muscle, left hand: Secondary | ICD-10-CM | POA: Diagnosis not present

## 2019-03-16 DIAGNOSIS — I639 Cerebral infarction, unspecified: Secondary | ICD-10-CM | POA: Diagnosis not present

## 2019-03-16 DIAGNOSIS — M6281 Muscle weakness (generalized): Secondary | ICD-10-CM | POA: Diagnosis not present

## 2019-03-17 DIAGNOSIS — M24541 Contracture, right hand: Secondary | ICD-10-CM | POA: Diagnosis not present

## 2019-03-17 DIAGNOSIS — Z741 Need for assistance with personal care: Secondary | ICD-10-CM | POA: Diagnosis not present

## 2019-03-17 DIAGNOSIS — M6281 Muscle weakness (generalized): Secondary | ICD-10-CM | POA: Diagnosis not present

## 2019-03-17 DIAGNOSIS — M62442 Contracture of muscle, left hand: Secondary | ICD-10-CM | POA: Diagnosis not present

## 2019-03-17 DIAGNOSIS — I69351 Hemiplegia and hemiparesis following cerebral infarction affecting right dominant side: Secondary | ICD-10-CM | POA: Diagnosis not present

## 2019-03-17 DIAGNOSIS — I639 Cerebral infarction, unspecified: Secondary | ICD-10-CM | POA: Diagnosis not present

## 2019-03-20 DIAGNOSIS — Z741 Need for assistance with personal care: Secondary | ICD-10-CM | POA: Diagnosis not present

## 2019-03-20 DIAGNOSIS — M62442 Contracture of muscle, left hand: Secondary | ICD-10-CM | POA: Diagnosis not present

## 2019-03-20 DIAGNOSIS — M6281 Muscle weakness (generalized): Secondary | ICD-10-CM | POA: Diagnosis not present

## 2019-03-20 DIAGNOSIS — M24541 Contracture, right hand: Secondary | ICD-10-CM | POA: Diagnosis not present

## 2019-03-20 DIAGNOSIS — I69351 Hemiplegia and hemiparesis following cerebral infarction affecting right dominant side: Secondary | ICD-10-CM | POA: Diagnosis not present

## 2019-03-20 DIAGNOSIS — I639 Cerebral infarction, unspecified: Secondary | ICD-10-CM | POA: Diagnosis not present

## 2019-03-21 DIAGNOSIS — M6281 Muscle weakness (generalized): Secondary | ICD-10-CM | POA: Diagnosis not present

## 2019-03-21 DIAGNOSIS — I639 Cerebral infarction, unspecified: Secondary | ICD-10-CM | POA: Diagnosis not present

## 2019-03-21 DIAGNOSIS — I69351 Hemiplegia and hemiparesis following cerebral infarction affecting right dominant side: Secondary | ICD-10-CM | POA: Diagnosis not present

## 2019-03-21 DIAGNOSIS — M62442 Contracture of muscle, left hand: Secondary | ICD-10-CM | POA: Diagnosis not present

## 2019-03-21 DIAGNOSIS — M24541 Contracture, right hand: Secondary | ICD-10-CM | POA: Diagnosis not present

## 2019-03-21 DIAGNOSIS — Z741 Need for assistance with personal care: Secondary | ICD-10-CM | POA: Diagnosis not present

## 2019-03-22 DIAGNOSIS — M6281 Muscle weakness (generalized): Secondary | ICD-10-CM | POA: Diagnosis not present

## 2019-03-22 DIAGNOSIS — I69351 Hemiplegia and hemiparesis following cerebral infarction affecting right dominant side: Secondary | ICD-10-CM | POA: Diagnosis not present

## 2019-03-22 DIAGNOSIS — Z741 Need for assistance with personal care: Secondary | ICD-10-CM | POA: Diagnosis not present

## 2019-03-22 DIAGNOSIS — I639 Cerebral infarction, unspecified: Secondary | ICD-10-CM | POA: Diagnosis not present

## 2019-03-22 DIAGNOSIS — M62442 Contracture of muscle, left hand: Secondary | ICD-10-CM | POA: Diagnosis not present

## 2019-03-22 DIAGNOSIS — M24541 Contracture, right hand: Secondary | ICD-10-CM | POA: Diagnosis not present

## 2019-03-23 DIAGNOSIS — Z741 Need for assistance with personal care: Secondary | ICD-10-CM | POA: Diagnosis not present

## 2019-03-23 DIAGNOSIS — I69351 Hemiplegia and hemiparesis following cerebral infarction affecting right dominant side: Secondary | ICD-10-CM | POA: Diagnosis not present

## 2019-03-23 DIAGNOSIS — M6281 Muscle weakness (generalized): Secondary | ICD-10-CM | POA: Diagnosis not present

## 2019-03-23 DIAGNOSIS — M62442 Contracture of muscle, left hand: Secondary | ICD-10-CM | POA: Diagnosis not present

## 2019-03-23 DIAGNOSIS — I639 Cerebral infarction, unspecified: Secondary | ICD-10-CM | POA: Diagnosis not present

## 2019-03-23 DIAGNOSIS — M24541 Contracture, right hand: Secondary | ICD-10-CM | POA: Diagnosis not present

## 2019-03-24 DIAGNOSIS — M24541 Contracture, right hand: Secondary | ICD-10-CM | POA: Diagnosis not present

## 2019-03-24 DIAGNOSIS — M6281 Muscle weakness (generalized): Secondary | ICD-10-CM | POA: Diagnosis not present

## 2019-03-24 DIAGNOSIS — I69351 Hemiplegia and hemiparesis following cerebral infarction affecting right dominant side: Secondary | ICD-10-CM | POA: Diagnosis not present

## 2019-03-24 DIAGNOSIS — M62442 Contracture of muscle, left hand: Secondary | ICD-10-CM | POA: Diagnosis not present

## 2019-03-24 DIAGNOSIS — Z741 Need for assistance with personal care: Secondary | ICD-10-CM | POA: Diagnosis not present

## 2019-03-24 DIAGNOSIS — I639 Cerebral infarction, unspecified: Secondary | ICD-10-CM | POA: Diagnosis not present

## 2019-03-27 DIAGNOSIS — F809 Developmental disorder of speech and language, unspecified: Secondary | ICD-10-CM | POA: Diagnosis not present

## 2019-03-27 DIAGNOSIS — F419 Anxiety disorder, unspecified: Secondary | ICD-10-CM | POA: Diagnosis not present

## 2019-04-10 DIAGNOSIS — I69351 Hemiplegia and hemiparesis following cerebral infarction affecting right dominant side: Secondary | ICD-10-CM | POA: Diagnosis not present

## 2019-04-10 DIAGNOSIS — I639 Cerebral infarction, unspecified: Secondary | ICD-10-CM | POA: Diagnosis not present

## 2019-04-10 DIAGNOSIS — H409 Unspecified glaucoma: Secondary | ICD-10-CM | POA: Diagnosis not present

## 2019-04-10 DIAGNOSIS — F339 Major depressive disorder, recurrent, unspecified: Secondary | ICD-10-CM | POA: Diagnosis not present

## 2019-04-19 IMAGING — CT CT HEAD W/O CM
3 series · 15 of 47 positions shown, 18 images · non-contrast
Comparison: Diagnostic cerebral angiogram 02/15/2018. Brain MRI and
CTA head and neck 02/13/2018. Head CT 02/12/2018.

CLINICAL DATA: 77-year-old male with patchy acute left hemisphere
infarcts found to have critical left ICA stenosis.

EXAM:
CT HEAD WITHOUT CONTRAST
TECHNIQUE: Contiguous axial images were obtained from the base of the skull
through the vertex without intravenous contrast.

[Series 3: head 5.0 h30s · axial · 0.42mm/px · z∈[+996,+1136]mm · 9 of 34 slices shown, 12 images]
[im 3/34  brain]
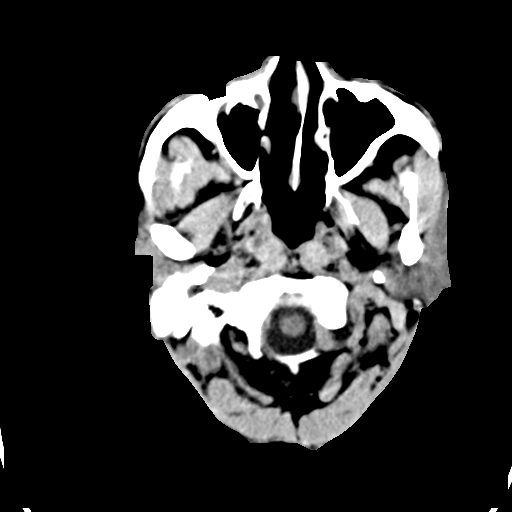
[im 3/34  bone]
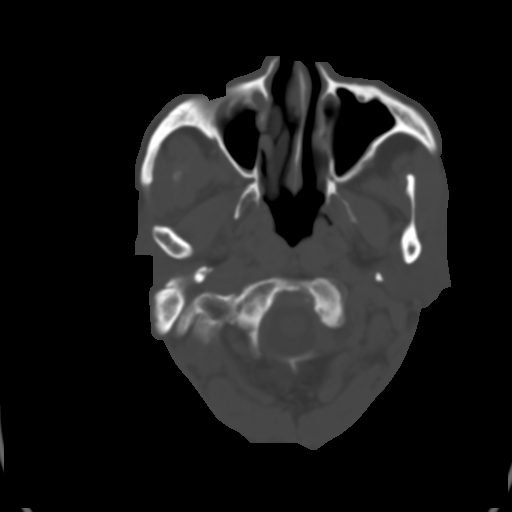
[im 6/34  brain]
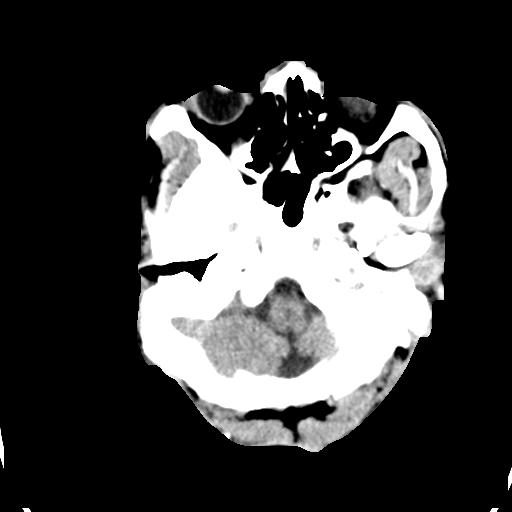
[im 10/34  brain]
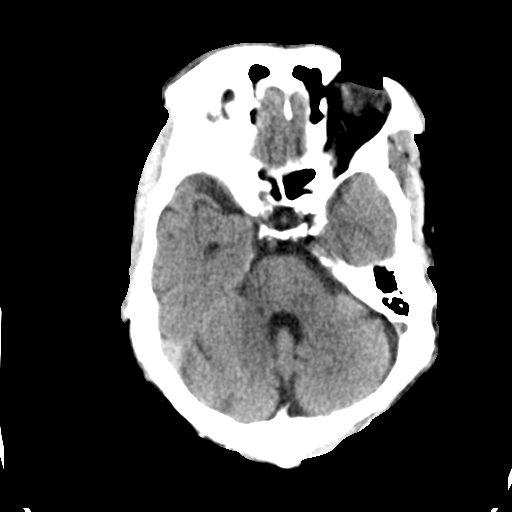
[im 13/34  brain]
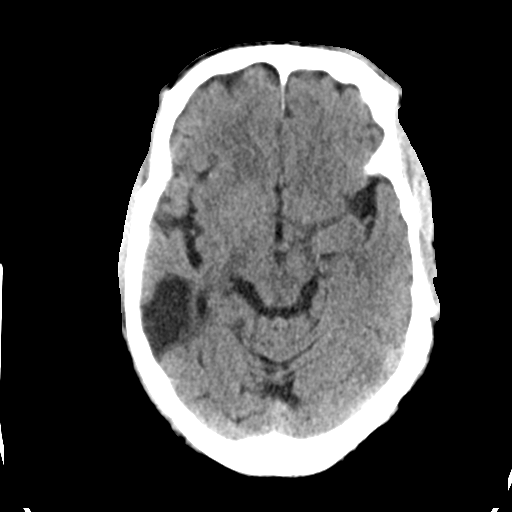
[im 18/34  brain]
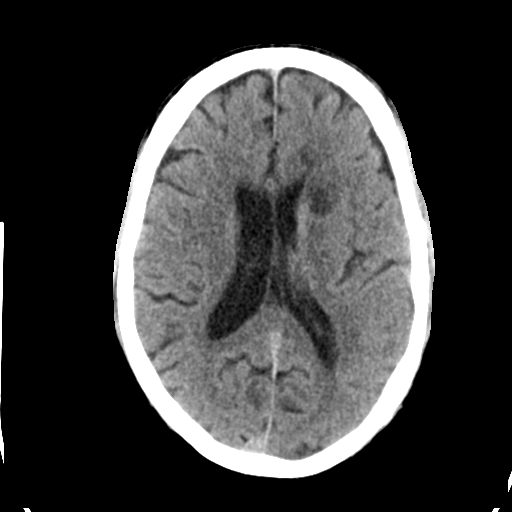
[im 18/34  bone]
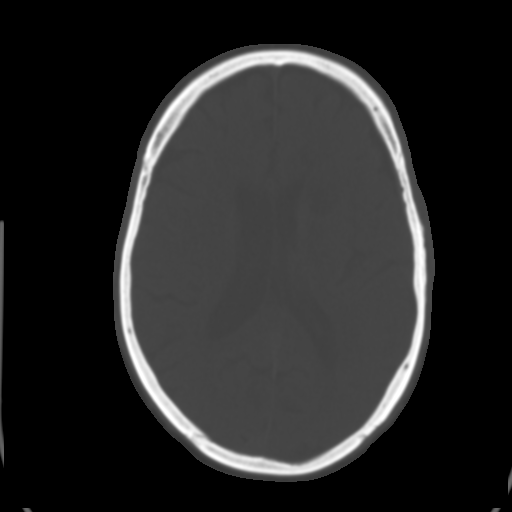
[im 21/34  brain]
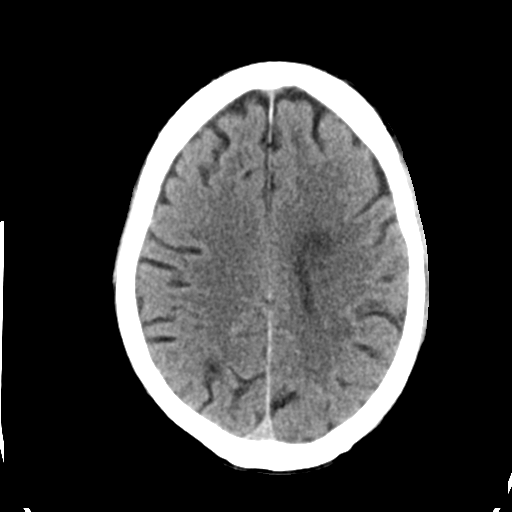
[im 24/34  brain]
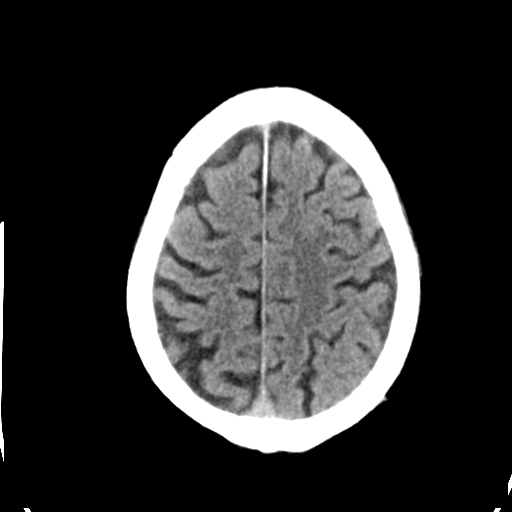
[im 28/34  brain]
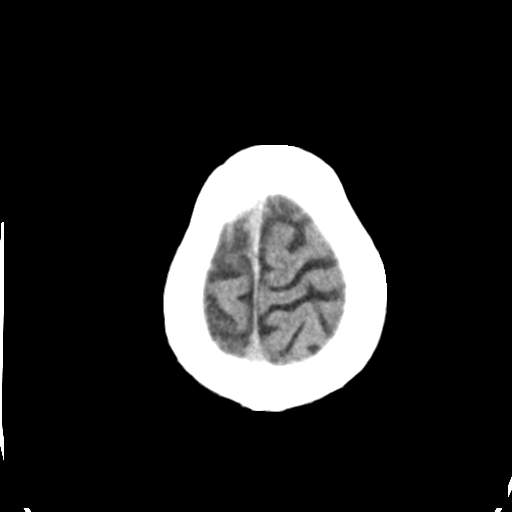
[im 31/34  brain]
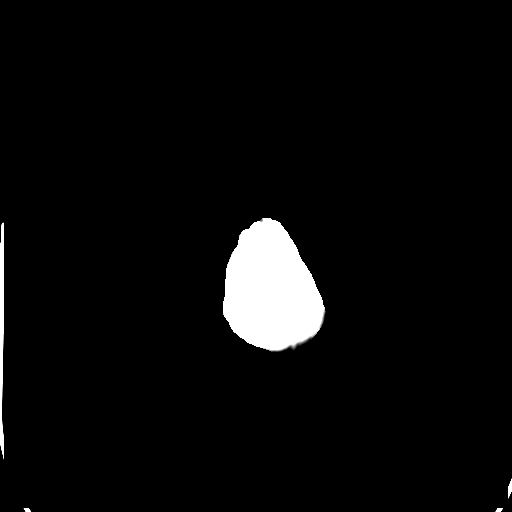
[im 31/34  bone]
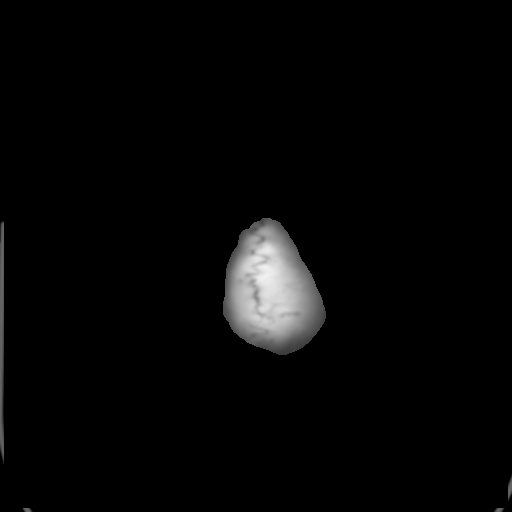

[Series 5: head 3.0 mpr cor · coronal · 0.32mm/px · 3 of 67 slices shown]
[im 23/67  brain]
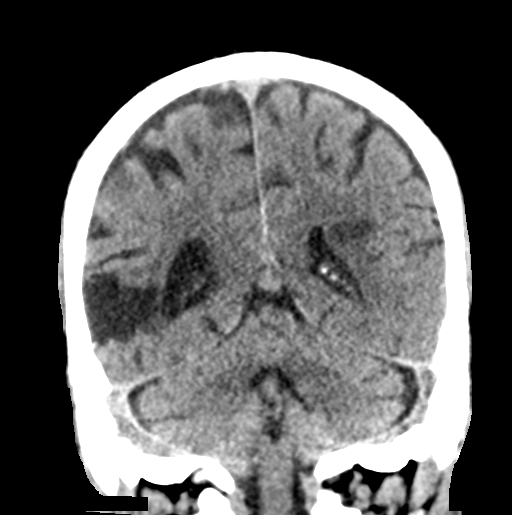
[im 30/67  brain]
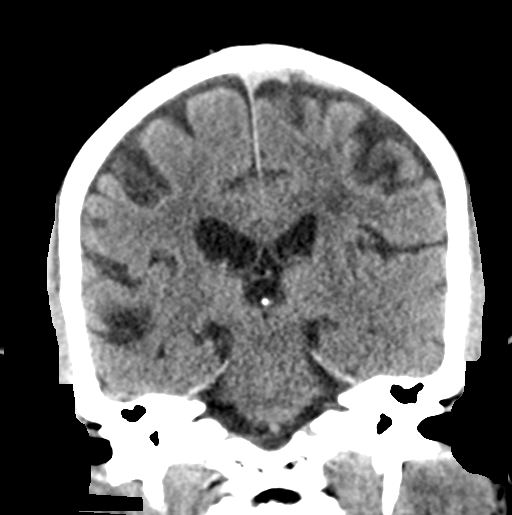
[im 37/67  brain]
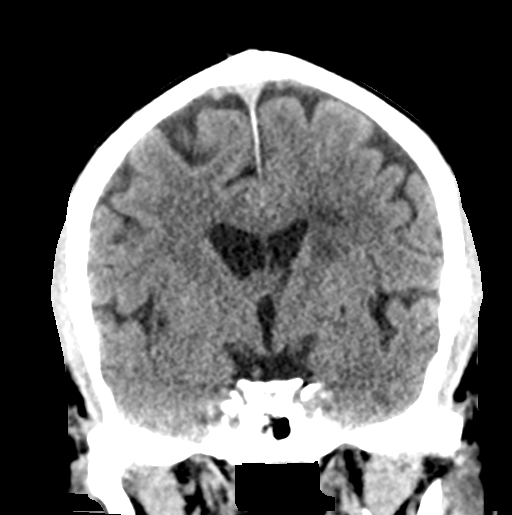

[Series 6: head 3.0 mpr sag · sagittal · 0.32mm/px · 3 of 66 slices shown]
[im 22/66  brain]
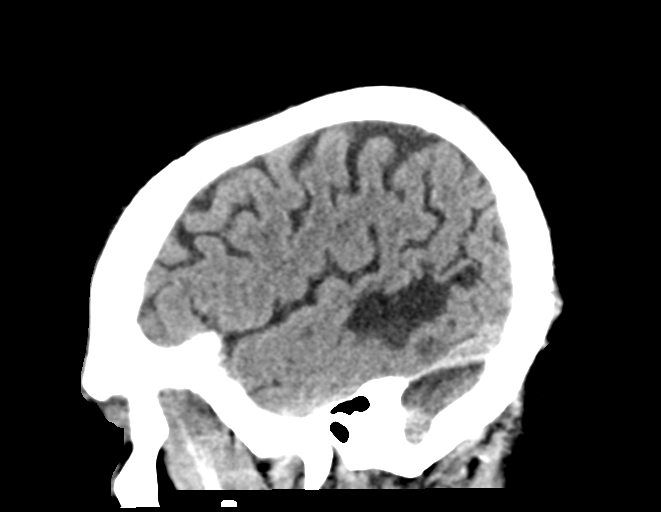
[im 33/66  brain]
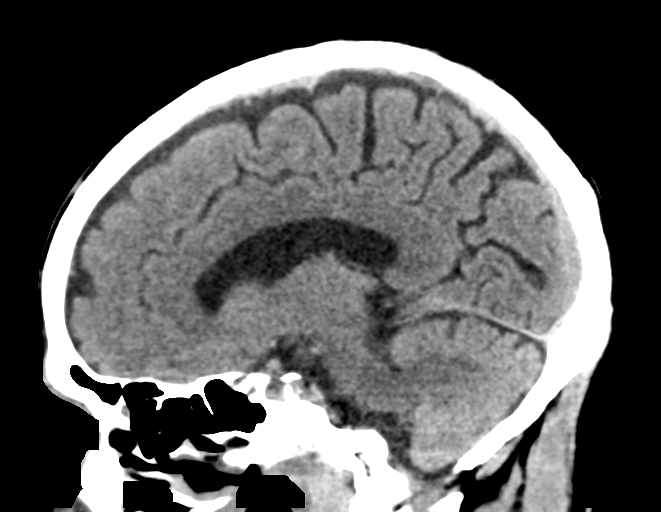
[im 44/66  brain]
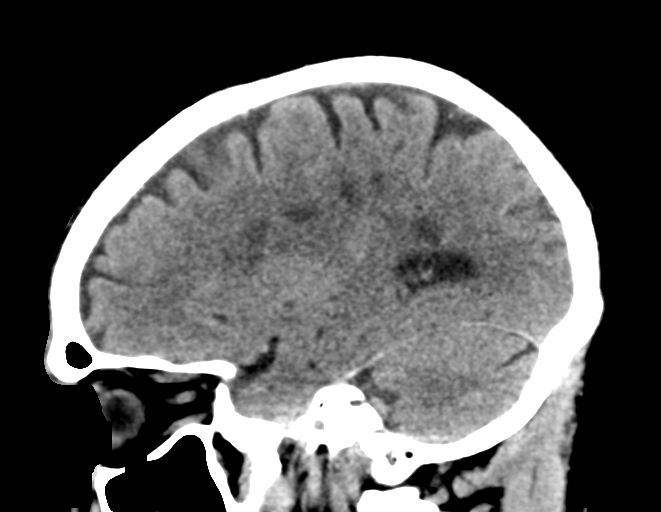

[15 of 47 positions shown; findings below may reference images not displayed]

FINDINGS: Brain: Progressed patchy hypodensity in the left MCA and left
MCA/ACA watershed territory corresponding to areas of restricted
diffusion on 02/13/2018. No associated hemorrhage or mass effect.
Superimposed chronic posterior right temporal lobe and right
parietal lobe encephalomalacia. No new cortically based infarct
identified. Stable ventricle size and configuration. No intracranial
mass effect

Vascular: Calcified atherosclerosis at the skull base. No suspicious
intracranial vascular hyperdensity.

Skull: No acute osseous abnormality identified.

Sinuses/Orbits: Stable paranasal sinuses.

Other: Stable orbit and scalp soft tissues.
IMPRESSION: 1. Expected evolution of the Left MCA and MCA/ACA watershed
territory infarcts seen on 02/13/2018 with no associated hemorrhage
or mass effect.
2. Chronic right MCA infarcts.
3. No new intracranial abnormality.

## 2019-04-21 DIAGNOSIS — Z03818 Encounter for observation for suspected exposure to other biological agents ruled out: Secondary | ICD-10-CM | POA: Diagnosis not present

## 2019-04-28 DIAGNOSIS — I69351 Hemiplegia and hemiparesis following cerebral infarction affecting right dominant side: Secondary | ICD-10-CM | POA: Diagnosis not present

## 2019-04-28 DIAGNOSIS — I10 Essential (primary) hypertension: Secondary | ICD-10-CM | POA: Diagnosis not present

## 2019-04-28 DIAGNOSIS — H409 Unspecified glaucoma: Secondary | ICD-10-CM | POA: Diagnosis not present

## 2019-04-28 DIAGNOSIS — Z9114 Patient's other noncompliance with medication regimen: Secondary | ICD-10-CM | POA: Diagnosis not present

## 2019-05-08 DIAGNOSIS — H409 Unspecified glaucoma: Secondary | ICD-10-CM | POA: Diagnosis not present

## 2019-05-08 DIAGNOSIS — I639 Cerebral infarction, unspecified: Secondary | ICD-10-CM | POA: Diagnosis not present

## 2019-05-08 DIAGNOSIS — I69351 Hemiplegia and hemiparesis following cerebral infarction affecting right dominant side: Secondary | ICD-10-CM | POA: Diagnosis not present

## 2019-05-08 DIAGNOSIS — F339 Major depressive disorder, recurrent, unspecified: Secondary | ICD-10-CM | POA: Diagnosis not present

## 2019-05-12 DIAGNOSIS — Z20828 Contact with and (suspected) exposure to other viral communicable diseases: Secondary | ICD-10-CM | POA: Diagnosis not present

## 2019-06-13 DIAGNOSIS — M6281 Muscle weakness (generalized): Secondary | ICD-10-CM | POA: Diagnosis not present

## 2019-06-13 DIAGNOSIS — I69351 Hemiplegia and hemiparesis following cerebral infarction affecting right dominant side: Secondary | ICD-10-CM | POA: Diagnosis not present

## 2019-06-13 DIAGNOSIS — M24541 Contracture, right hand: Secondary | ICD-10-CM | POA: Diagnosis not present

## 2019-06-14 DIAGNOSIS — F419 Anxiety disorder, unspecified: Secondary | ICD-10-CM | POA: Diagnosis not present

## 2019-06-14 DIAGNOSIS — F329 Major depressive disorder, single episode, unspecified: Secondary | ICD-10-CM | POA: Diagnosis not present

## 2019-06-14 DIAGNOSIS — M6281 Muscle weakness (generalized): Secondary | ICD-10-CM | POA: Diagnosis not present

## 2019-06-14 DIAGNOSIS — M24541 Contracture, right hand: Secondary | ICD-10-CM | POA: Diagnosis not present

## 2019-06-14 DIAGNOSIS — I679 Cerebrovascular disease, unspecified: Secondary | ICD-10-CM | POA: Diagnosis not present

## 2019-06-14 DIAGNOSIS — I69351 Hemiplegia and hemiparesis following cerebral infarction affecting right dominant side: Secondary | ICD-10-CM | POA: Diagnosis not present

## 2019-06-15 DIAGNOSIS — M6281 Muscle weakness (generalized): Secondary | ICD-10-CM | POA: Diagnosis not present

## 2019-06-15 DIAGNOSIS — I69351 Hemiplegia and hemiparesis following cerebral infarction affecting right dominant side: Secondary | ICD-10-CM | POA: Diagnosis not present

## 2019-06-15 DIAGNOSIS — M24541 Contracture, right hand: Secondary | ICD-10-CM | POA: Diagnosis not present

## 2019-06-16 DIAGNOSIS — M6281 Muscle weakness (generalized): Secondary | ICD-10-CM | POA: Diagnosis not present

## 2019-06-16 DIAGNOSIS — M24541 Contracture, right hand: Secondary | ICD-10-CM | POA: Diagnosis not present

## 2019-06-16 DIAGNOSIS — I69351 Hemiplegia and hemiparesis following cerebral infarction affecting right dominant side: Secondary | ICD-10-CM | POA: Diagnosis not present

## 2019-06-19 DIAGNOSIS — M24541 Contracture, right hand: Secondary | ICD-10-CM | POA: Diagnosis not present

## 2019-06-19 DIAGNOSIS — I69351 Hemiplegia and hemiparesis following cerebral infarction affecting right dominant side: Secondary | ICD-10-CM | POA: Diagnosis not present

## 2019-06-19 DIAGNOSIS — M6281 Muscle weakness (generalized): Secondary | ICD-10-CM | POA: Diagnosis not present

## 2019-06-20 DIAGNOSIS — M6281 Muscle weakness (generalized): Secondary | ICD-10-CM | POA: Diagnosis not present

## 2019-06-20 DIAGNOSIS — M24541 Contracture, right hand: Secondary | ICD-10-CM | POA: Diagnosis not present

## 2019-06-20 DIAGNOSIS — I69351 Hemiplegia and hemiparesis following cerebral infarction affecting right dominant side: Secondary | ICD-10-CM | POA: Diagnosis not present

## 2019-06-21 DIAGNOSIS — I69351 Hemiplegia and hemiparesis following cerebral infarction affecting right dominant side: Secondary | ICD-10-CM | POA: Diagnosis not present

## 2019-06-21 DIAGNOSIS — M24541 Contracture, right hand: Secondary | ICD-10-CM | POA: Diagnosis not present

## 2019-06-21 DIAGNOSIS — M6281 Muscle weakness (generalized): Secondary | ICD-10-CM | POA: Diagnosis not present

## 2019-06-22 DIAGNOSIS — M24541 Contracture, right hand: Secondary | ICD-10-CM | POA: Diagnosis not present

## 2019-06-22 DIAGNOSIS — M6281 Muscle weakness (generalized): Secondary | ICD-10-CM | POA: Diagnosis not present

## 2019-06-22 DIAGNOSIS — I69351 Hemiplegia and hemiparesis following cerebral infarction affecting right dominant side: Secondary | ICD-10-CM | POA: Diagnosis not present

## 2019-06-23 DIAGNOSIS — M6281 Muscle weakness (generalized): Secondary | ICD-10-CM | POA: Diagnosis not present

## 2019-06-23 DIAGNOSIS — M24541 Contracture, right hand: Secondary | ICD-10-CM | POA: Diagnosis not present

## 2019-06-23 DIAGNOSIS — I69351 Hemiplegia and hemiparesis following cerebral infarction affecting right dominant side: Secondary | ICD-10-CM | POA: Diagnosis not present

## 2019-06-26 DIAGNOSIS — M6281 Muscle weakness (generalized): Secondary | ICD-10-CM | POA: Diagnosis not present

## 2019-06-26 DIAGNOSIS — I69351 Hemiplegia and hemiparesis following cerebral infarction affecting right dominant side: Secondary | ICD-10-CM | POA: Diagnosis not present

## 2019-06-26 DIAGNOSIS — M24541 Contracture, right hand: Secondary | ICD-10-CM | POA: Diagnosis not present

## 2019-06-27 DIAGNOSIS — I69351 Hemiplegia and hemiparesis following cerebral infarction affecting right dominant side: Secondary | ICD-10-CM | POA: Diagnosis not present

## 2019-06-27 DIAGNOSIS — M24541 Contracture, right hand: Secondary | ICD-10-CM | POA: Diagnosis not present

## 2019-06-27 DIAGNOSIS — M6281 Muscle weakness (generalized): Secondary | ICD-10-CM | POA: Diagnosis not present

## 2019-06-28 DIAGNOSIS — M24541 Contracture, right hand: Secondary | ICD-10-CM | POA: Diagnosis not present

## 2019-06-28 DIAGNOSIS — M6281 Muscle weakness (generalized): Secondary | ICD-10-CM | POA: Diagnosis not present

## 2019-06-28 DIAGNOSIS — I69351 Hemiplegia and hemiparesis following cerebral infarction affecting right dominant side: Secondary | ICD-10-CM | POA: Diagnosis not present

## 2019-06-29 DIAGNOSIS — I69351 Hemiplegia and hemiparesis following cerebral infarction affecting right dominant side: Secondary | ICD-10-CM | POA: Diagnosis not present

## 2019-06-29 DIAGNOSIS — M6281 Muscle weakness (generalized): Secondary | ICD-10-CM | POA: Diagnosis not present

## 2019-06-29 DIAGNOSIS — M24541 Contracture, right hand: Secondary | ICD-10-CM | POA: Diagnosis not present

## 2019-06-30 DIAGNOSIS — I69351 Hemiplegia and hemiparesis following cerebral infarction affecting right dominant side: Secondary | ICD-10-CM | POA: Diagnosis not present

## 2019-06-30 DIAGNOSIS — M24541 Contracture, right hand: Secondary | ICD-10-CM | POA: Diagnosis not present

## 2019-06-30 DIAGNOSIS — M6281 Muscle weakness (generalized): Secondary | ICD-10-CM | POA: Diagnosis not present

## 2019-07-01 DIAGNOSIS — M6281 Muscle weakness (generalized): Secondary | ICD-10-CM | POA: Diagnosis not present

## 2019-07-01 DIAGNOSIS — I69351 Hemiplegia and hemiparesis following cerebral infarction affecting right dominant side: Secondary | ICD-10-CM | POA: Diagnosis not present

## 2019-07-01 DIAGNOSIS — M24541 Contracture, right hand: Secondary | ICD-10-CM | POA: Diagnosis not present

## 2019-07-03 DIAGNOSIS — I69351 Hemiplegia and hemiparesis following cerebral infarction affecting right dominant side: Secondary | ICD-10-CM | POA: Diagnosis not present

## 2019-07-03 DIAGNOSIS — M24541 Contracture, right hand: Secondary | ICD-10-CM | POA: Diagnosis not present

## 2019-07-03 DIAGNOSIS — M6281 Muscle weakness (generalized): Secondary | ICD-10-CM | POA: Diagnosis not present

## 2019-07-05 DIAGNOSIS — M24541 Contracture, right hand: Secondary | ICD-10-CM | POA: Diagnosis not present

## 2019-07-05 DIAGNOSIS — M6281 Muscle weakness (generalized): Secondary | ICD-10-CM | POA: Diagnosis not present

## 2019-07-05 DIAGNOSIS — I69351 Hemiplegia and hemiparesis following cerebral infarction affecting right dominant side: Secondary | ICD-10-CM | POA: Diagnosis not present

## 2019-07-06 DIAGNOSIS — I69351 Hemiplegia and hemiparesis following cerebral infarction affecting right dominant side: Secondary | ICD-10-CM | POA: Diagnosis not present

## 2019-07-06 DIAGNOSIS — M6281 Muscle weakness (generalized): Secondary | ICD-10-CM | POA: Diagnosis not present

## 2019-07-06 DIAGNOSIS — M24541 Contracture, right hand: Secondary | ICD-10-CM | POA: Diagnosis not present

## 2019-07-07 DIAGNOSIS — M6281 Muscle weakness (generalized): Secondary | ICD-10-CM | POA: Diagnosis not present

## 2019-07-07 DIAGNOSIS — I69351 Hemiplegia and hemiparesis following cerebral infarction affecting right dominant side: Secondary | ICD-10-CM | POA: Diagnosis not present

## 2019-07-07 DIAGNOSIS — M24541 Contracture, right hand: Secondary | ICD-10-CM | POA: Diagnosis not present

## 2019-07-08 DIAGNOSIS — M24541 Contracture, right hand: Secondary | ICD-10-CM | POA: Diagnosis not present

## 2019-07-08 DIAGNOSIS — I69351 Hemiplegia and hemiparesis following cerebral infarction affecting right dominant side: Secondary | ICD-10-CM | POA: Diagnosis not present

## 2019-07-08 DIAGNOSIS — M6281 Muscle weakness (generalized): Secondary | ICD-10-CM | POA: Diagnosis not present

## 2019-07-09 DIAGNOSIS — Z20828 Contact with and (suspected) exposure to other viral communicable diseases: Secondary | ICD-10-CM | POA: Diagnosis not present

## 2019-07-10 DIAGNOSIS — H409 Unspecified glaucoma: Secondary | ICD-10-CM | POA: Diagnosis not present

## 2019-07-10 DIAGNOSIS — F339 Major depressive disorder, recurrent, unspecified: Secondary | ICD-10-CM | POA: Diagnosis not present

## 2019-07-10 DIAGNOSIS — M24541 Contracture, right hand: Secondary | ICD-10-CM | POA: Diagnosis not present

## 2019-07-10 DIAGNOSIS — I639 Cerebral infarction, unspecified: Secondary | ICD-10-CM | POA: Diagnosis not present

## 2019-07-10 DIAGNOSIS — M6281 Muscle weakness (generalized): Secondary | ICD-10-CM | POA: Diagnosis not present

## 2019-07-10 DIAGNOSIS — I69351 Hemiplegia and hemiparesis following cerebral infarction affecting right dominant side: Secondary | ICD-10-CM | POA: Diagnosis not present

## 2019-07-11 DIAGNOSIS — M6281 Muscle weakness (generalized): Secondary | ICD-10-CM | POA: Diagnosis not present

## 2019-07-11 DIAGNOSIS — M24541 Contracture, right hand: Secondary | ICD-10-CM | POA: Diagnosis not present

## 2019-07-11 DIAGNOSIS — I69351 Hemiplegia and hemiparesis following cerebral infarction affecting right dominant side: Secondary | ICD-10-CM | POA: Diagnosis not present

## 2019-07-12 DIAGNOSIS — M24541 Contracture, right hand: Secondary | ICD-10-CM | POA: Diagnosis not present

## 2019-07-12 DIAGNOSIS — M6281 Muscle weakness (generalized): Secondary | ICD-10-CM | POA: Diagnosis not present

## 2019-07-12 DIAGNOSIS — I69351 Hemiplegia and hemiparesis following cerebral infarction affecting right dominant side: Secondary | ICD-10-CM | POA: Diagnosis not present

## 2019-07-13 DIAGNOSIS — Z20828 Contact with and (suspected) exposure to other viral communicable diseases: Secondary | ICD-10-CM | POA: Diagnosis not present

## 2019-07-14 DIAGNOSIS — M24541 Contracture, right hand: Secondary | ICD-10-CM | POA: Diagnosis not present

## 2019-07-14 DIAGNOSIS — M6281 Muscle weakness (generalized): Secondary | ICD-10-CM | POA: Diagnosis not present

## 2019-07-14 DIAGNOSIS — I69351 Hemiplegia and hemiparesis following cerebral infarction affecting right dominant side: Secondary | ICD-10-CM | POA: Diagnosis not present

## 2019-07-17 DIAGNOSIS — I69351 Hemiplegia and hemiparesis following cerebral infarction affecting right dominant side: Secondary | ICD-10-CM | POA: Diagnosis not present

## 2019-07-17 DIAGNOSIS — M24541 Contracture, right hand: Secondary | ICD-10-CM | POA: Diagnosis not present

## 2019-07-17 DIAGNOSIS — M6281 Muscle weakness (generalized): Secondary | ICD-10-CM | POA: Diagnosis not present

## 2019-07-18 DIAGNOSIS — M6281 Muscle weakness (generalized): Secondary | ICD-10-CM | POA: Diagnosis not present

## 2019-07-18 DIAGNOSIS — I69351 Hemiplegia and hemiparesis following cerebral infarction affecting right dominant side: Secondary | ICD-10-CM | POA: Diagnosis not present

## 2019-07-18 DIAGNOSIS — M24541 Contracture, right hand: Secondary | ICD-10-CM | POA: Diagnosis not present

## 2019-07-19 DIAGNOSIS — M6281 Muscle weakness (generalized): Secondary | ICD-10-CM | POA: Diagnosis not present

## 2019-07-19 DIAGNOSIS — I69351 Hemiplegia and hemiparesis following cerebral infarction affecting right dominant side: Secondary | ICD-10-CM | POA: Diagnosis not present

## 2019-07-19 DIAGNOSIS — M24541 Contracture, right hand: Secondary | ICD-10-CM | POA: Diagnosis not present

## 2019-07-20 DIAGNOSIS — M6281 Muscle weakness (generalized): Secondary | ICD-10-CM | POA: Diagnosis not present

## 2019-07-20 DIAGNOSIS — I69351 Hemiplegia and hemiparesis following cerebral infarction affecting right dominant side: Secondary | ICD-10-CM | POA: Diagnosis not present

## 2019-07-20 DIAGNOSIS — M24541 Contracture, right hand: Secondary | ICD-10-CM | POA: Diagnosis not present

## 2019-07-20 DIAGNOSIS — Z20828 Contact with and (suspected) exposure to other viral communicable diseases: Secondary | ICD-10-CM | POA: Diagnosis not present

## 2019-07-21 DIAGNOSIS — M6281 Muscle weakness (generalized): Secondary | ICD-10-CM | POA: Diagnosis not present

## 2019-07-21 DIAGNOSIS — M24541 Contracture, right hand: Secondary | ICD-10-CM | POA: Diagnosis not present

## 2019-07-21 DIAGNOSIS — I69351 Hemiplegia and hemiparesis following cerebral infarction affecting right dominant side: Secondary | ICD-10-CM | POA: Diagnosis not present

## 2019-07-27 DIAGNOSIS — Z20828 Contact with and (suspected) exposure to other viral communicable diseases: Secondary | ICD-10-CM | POA: Diagnosis not present

## 2019-08-02 DIAGNOSIS — F39 Unspecified mood [affective] disorder: Secondary | ICD-10-CM | POA: Diagnosis not present

## 2019-08-02 DIAGNOSIS — I10 Essential (primary) hypertension: Secondary | ICD-10-CM | POA: Diagnosis not present

## 2019-08-02 DIAGNOSIS — E785 Hyperlipidemia, unspecified: Secondary | ICD-10-CM | POA: Diagnosis not present

## 2019-08-03 DIAGNOSIS — Z20828 Contact with and (suspected) exposure to other viral communicable diseases: Secondary | ICD-10-CM | POA: Diagnosis not present

## 2019-08-10 DIAGNOSIS — Z20828 Contact with and (suspected) exposure to other viral communicable diseases: Secondary | ICD-10-CM | POA: Diagnosis not present

## 2019-08-14 DIAGNOSIS — F39 Unspecified mood [affective] disorder: Secondary | ICD-10-CM | POA: Diagnosis not present

## 2019-08-14 DIAGNOSIS — F419 Anxiety disorder, unspecified: Secondary | ICD-10-CM | POA: Diagnosis not present

## 2019-08-14 DIAGNOSIS — Z79899 Other long term (current) drug therapy: Secondary | ICD-10-CM | POA: Diagnosis not present

## 2019-08-15 DIAGNOSIS — Z20828 Contact with and (suspected) exposure to other viral communicable diseases: Secondary | ICD-10-CM | POA: Diagnosis not present

## 2019-08-22 DIAGNOSIS — Z20828 Contact with and (suspected) exposure to other viral communicable diseases: Secondary | ICD-10-CM | POA: Diagnosis not present

## 2019-08-24 DIAGNOSIS — M24541 Contracture, right hand: Secondary | ICD-10-CM | POA: Diagnosis present

## 2019-08-24 DIAGNOSIS — U071 COVID-19: Secondary | ICD-10-CM | POA: Diagnosis present

## 2019-08-24 DIAGNOSIS — Z741 Need for assistance with personal care: Secondary | ICD-10-CM | POA: Diagnosis present

## 2019-08-24 DIAGNOSIS — M6281 Muscle weakness (generalized): Secondary | ICD-10-CM | POA: Diagnosis present

## 2019-08-24 DIAGNOSIS — R2689 Other abnormalities of gait and mobility: Secondary | ICD-10-CM | POA: Diagnosis present

## 2019-08-24 DIAGNOSIS — I69351 Hemiplegia and hemiparesis following cerebral infarction affecting right dominant side: Secondary | ICD-10-CM | POA: Diagnosis not present

## 2019-08-24 DIAGNOSIS — I69391 Dysphagia following cerebral infarction: Secondary | ICD-10-CM | POA: Diagnosis not present

## 2019-08-24 DIAGNOSIS — F809 Developmental disorder of speech and language, unspecified: Secondary | ICD-10-CM | POA: Diagnosis not present

## 2019-08-25 DIAGNOSIS — U071 COVID-19: Secondary | ICD-10-CM | POA: Diagnosis not present

## 2019-08-25 DIAGNOSIS — F809 Developmental disorder of speech and language, unspecified: Secondary | ICD-10-CM | POA: Diagnosis not present

## 2019-08-28 DIAGNOSIS — U071 COVID-19: Secondary | ICD-10-CM | POA: Diagnosis not present

## 2019-08-28 DIAGNOSIS — F809 Developmental disorder of speech and language, unspecified: Secondary | ICD-10-CM | POA: Diagnosis not present

## 2019-09-04 DIAGNOSIS — R2689 Other abnormalities of gait and mobility: Secondary | ICD-10-CM | POA: Diagnosis not present

## 2019-09-04 DIAGNOSIS — U071 COVID-19: Secondary | ICD-10-CM | POA: Diagnosis not present

## 2019-09-04 DIAGNOSIS — M6281 Muscle weakness (generalized): Secondary | ICD-10-CM | POA: Diagnosis not present

## 2019-09-04 DIAGNOSIS — Z741 Need for assistance with personal care: Secondary | ICD-10-CM | POA: Diagnosis not present

## 2019-09-05 DIAGNOSIS — M6281 Muscle weakness (generalized): Secondary | ICD-10-CM | POA: Diagnosis not present

## 2019-09-05 DIAGNOSIS — R2689 Other abnormalities of gait and mobility: Secondary | ICD-10-CM | POA: Diagnosis not present

## 2019-09-05 DIAGNOSIS — U071 COVID-19: Secondary | ICD-10-CM | POA: Diagnosis not present

## 2019-09-05 DIAGNOSIS — Z741 Need for assistance with personal care: Secondary | ICD-10-CM | POA: Diagnosis not present

## 2019-09-06 DIAGNOSIS — R2689 Other abnormalities of gait and mobility: Secondary | ICD-10-CM | POA: Diagnosis not present

## 2019-09-06 DIAGNOSIS — U071 COVID-19: Secondary | ICD-10-CM | POA: Diagnosis not present

## 2019-09-06 DIAGNOSIS — Z741 Need for assistance with personal care: Secondary | ICD-10-CM | POA: Diagnosis not present

## 2019-09-06 DIAGNOSIS — M6281 Muscle weakness (generalized): Secondary | ICD-10-CM | POA: Diagnosis not present

## 2019-09-07 DIAGNOSIS — U071 COVID-19: Secondary | ICD-10-CM | POA: Diagnosis not present

## 2019-09-07 DIAGNOSIS — M6281 Muscle weakness (generalized): Secondary | ICD-10-CM | POA: Diagnosis not present

## 2019-09-07 DIAGNOSIS — R2689 Other abnormalities of gait and mobility: Secondary | ICD-10-CM | POA: Diagnosis not present

## 2019-09-07 DIAGNOSIS — Z741 Need for assistance with personal care: Secondary | ICD-10-CM | POA: Diagnosis not present

## 2019-09-08 DIAGNOSIS — Z741 Need for assistance with personal care: Secondary | ICD-10-CM | POA: Diagnosis not present

## 2019-09-08 DIAGNOSIS — U071 COVID-19: Secondary | ICD-10-CM | POA: Diagnosis not present

## 2019-09-08 DIAGNOSIS — M6281 Muscle weakness (generalized): Secondary | ICD-10-CM | POA: Diagnosis not present

## 2019-09-08 DIAGNOSIS — R2689 Other abnormalities of gait and mobility: Secondary | ICD-10-CM | POA: Diagnosis not present

## 2019-09-09 DIAGNOSIS — R2689 Other abnormalities of gait and mobility: Secondary | ICD-10-CM | POA: Diagnosis not present

## 2019-09-09 DIAGNOSIS — U071 COVID-19: Secondary | ICD-10-CM | POA: Diagnosis not present

## 2019-09-09 DIAGNOSIS — M6281 Muscle weakness (generalized): Secondary | ICD-10-CM | POA: Diagnosis not present

## 2019-09-09 DIAGNOSIS — Z741 Need for assistance with personal care: Secondary | ICD-10-CM | POA: Diagnosis not present

## 2019-09-11 DIAGNOSIS — U071 COVID-19: Secondary | ICD-10-CM | POA: Diagnosis not present

## 2019-09-11 DIAGNOSIS — R2689 Other abnormalities of gait and mobility: Secondary | ICD-10-CM | POA: Diagnosis not present

## 2019-09-11 DIAGNOSIS — Z741 Need for assistance with personal care: Secondary | ICD-10-CM | POA: Diagnosis not present

## 2019-09-11 DIAGNOSIS — M6281 Muscle weakness (generalized): Secondary | ICD-10-CM | POA: Diagnosis not present

## 2019-09-12 DIAGNOSIS — Z741 Need for assistance with personal care: Secondary | ICD-10-CM | POA: Diagnosis not present

## 2019-09-12 DIAGNOSIS — R2689 Other abnormalities of gait and mobility: Secondary | ICD-10-CM | POA: Diagnosis not present

## 2019-09-12 DIAGNOSIS — M6281 Muscle weakness (generalized): Secondary | ICD-10-CM | POA: Diagnosis not present

## 2019-09-12 DIAGNOSIS — U071 COVID-19: Secondary | ICD-10-CM | POA: Diagnosis not present

## 2019-09-13 DIAGNOSIS — R2689 Other abnormalities of gait and mobility: Secondary | ICD-10-CM | POA: Diagnosis not present

## 2019-09-13 DIAGNOSIS — M6281 Muscle weakness (generalized): Secondary | ICD-10-CM | POA: Diagnosis not present

## 2019-09-13 DIAGNOSIS — U071 COVID-19: Secondary | ICD-10-CM | POA: Diagnosis not present

## 2019-09-13 DIAGNOSIS — Z741 Need for assistance with personal care: Secondary | ICD-10-CM | POA: Diagnosis not present

## 2019-09-16 DIAGNOSIS — Z741 Need for assistance with personal care: Secondary | ICD-10-CM | POA: Diagnosis not present

## 2019-09-16 DIAGNOSIS — M6281 Muscle weakness (generalized): Secondary | ICD-10-CM | POA: Diagnosis not present

## 2019-09-16 DIAGNOSIS — R2689 Other abnormalities of gait and mobility: Secondary | ICD-10-CM | POA: Diagnosis not present

## 2019-09-16 DIAGNOSIS — U071 COVID-19: Secondary | ICD-10-CM | POA: Diagnosis not present

## 2019-09-19 DIAGNOSIS — R2689 Other abnormalities of gait and mobility: Secondary | ICD-10-CM | POA: Diagnosis not present

## 2019-09-19 DIAGNOSIS — Z741 Need for assistance with personal care: Secondary | ICD-10-CM | POA: Diagnosis not present

## 2019-09-19 DIAGNOSIS — U071 COVID-19: Secondary | ICD-10-CM | POA: Diagnosis not present

## 2019-09-19 DIAGNOSIS — M6281 Muscle weakness (generalized): Secondary | ICD-10-CM | POA: Diagnosis not present

## 2019-09-20 DIAGNOSIS — R2689 Other abnormalities of gait and mobility: Secondary | ICD-10-CM | POA: Diagnosis not present

## 2019-09-20 DIAGNOSIS — Z741 Need for assistance with personal care: Secondary | ICD-10-CM | POA: Diagnosis not present

## 2019-09-20 DIAGNOSIS — M6281 Muscle weakness (generalized): Secondary | ICD-10-CM | POA: Diagnosis not present

## 2019-09-20 DIAGNOSIS — U071 COVID-19: Secondary | ICD-10-CM | POA: Diagnosis not present

## 2019-09-21 DIAGNOSIS — R2689 Other abnormalities of gait and mobility: Secondary | ICD-10-CM | POA: Diagnosis not present

## 2019-09-21 DIAGNOSIS — M6281 Muscle weakness (generalized): Secondary | ICD-10-CM | POA: Diagnosis not present

## 2019-09-21 DIAGNOSIS — U071 COVID-19: Secondary | ICD-10-CM | POA: Diagnosis not present

## 2019-09-21 DIAGNOSIS — Z741 Need for assistance with personal care: Secondary | ICD-10-CM | POA: Diagnosis not present

## 2019-09-22 DIAGNOSIS — Z741 Need for assistance with personal care: Secondary | ICD-10-CM | POA: Diagnosis not present

## 2019-09-22 DIAGNOSIS — U071 COVID-19: Secondary | ICD-10-CM | POA: Diagnosis not present

## 2019-09-22 DIAGNOSIS — R2689 Other abnormalities of gait and mobility: Secondary | ICD-10-CM | POA: Diagnosis not present

## 2019-09-22 DIAGNOSIS — M6281 Muscle weakness (generalized): Secondary | ICD-10-CM | POA: Diagnosis not present

## 2019-09-26 DIAGNOSIS — U071 COVID-19: Secondary | ICD-10-CM | POA: Diagnosis not present

## 2019-09-26 DIAGNOSIS — Z741 Need for assistance with personal care: Secondary | ICD-10-CM | POA: Diagnosis not present

## 2019-09-26 DIAGNOSIS — R2689 Other abnormalities of gait and mobility: Secondary | ICD-10-CM | POA: Diagnosis not present

## 2019-09-26 DIAGNOSIS — M6281 Muscle weakness (generalized): Secondary | ICD-10-CM | POA: Diagnosis not present

## 2019-09-27 DIAGNOSIS — Z741 Need for assistance with personal care: Secondary | ICD-10-CM | POA: Diagnosis not present

## 2019-09-27 DIAGNOSIS — U071 COVID-19: Secondary | ICD-10-CM | POA: Diagnosis not present

## 2019-09-27 DIAGNOSIS — M6281 Muscle weakness (generalized): Secondary | ICD-10-CM | POA: Diagnosis not present

## 2019-09-27 DIAGNOSIS — R2689 Other abnormalities of gait and mobility: Secondary | ICD-10-CM | POA: Diagnosis not present

## 2019-09-28 DIAGNOSIS — M6281 Muscle weakness (generalized): Secondary | ICD-10-CM | POA: Diagnosis not present

## 2019-09-28 DIAGNOSIS — R2689 Other abnormalities of gait and mobility: Secondary | ICD-10-CM | POA: Diagnosis not present

## 2019-09-28 DIAGNOSIS — Z741 Need for assistance with personal care: Secondary | ICD-10-CM | POA: Diagnosis not present

## 2019-09-28 DIAGNOSIS — U071 COVID-19: Secondary | ICD-10-CM | POA: Diagnosis not present

## 2019-09-29 DIAGNOSIS — R2689 Other abnormalities of gait and mobility: Secondary | ICD-10-CM | POA: Diagnosis not present

## 2019-09-29 DIAGNOSIS — M6281 Muscle weakness (generalized): Secondary | ICD-10-CM | POA: Diagnosis not present

## 2019-09-29 DIAGNOSIS — U071 COVID-19: Secondary | ICD-10-CM | POA: Diagnosis not present

## 2019-09-29 DIAGNOSIS — Z741 Need for assistance with personal care: Secondary | ICD-10-CM | POA: Diagnosis not present

## 2019-10-04 DIAGNOSIS — Z741 Need for assistance with personal care: Secondary | ICD-10-CM | POA: Diagnosis not present

## 2019-10-04 DIAGNOSIS — R2689 Other abnormalities of gait and mobility: Secondary | ICD-10-CM | POA: Diagnosis not present

## 2019-10-04 DIAGNOSIS — M6281 Muscle weakness (generalized): Secondary | ICD-10-CM | POA: Diagnosis not present

## 2019-10-04 DIAGNOSIS — U071 COVID-19: Secondary | ICD-10-CM | POA: Diagnosis not present

## 2019-10-06 DIAGNOSIS — I69351 Hemiplegia and hemiparesis following cerebral infarction affecting right dominant side: Secondary | ICD-10-CM | POA: Diagnosis not present

## 2019-10-06 DIAGNOSIS — H409 Unspecified glaucoma: Secondary | ICD-10-CM | POA: Diagnosis not present

## 2019-10-06 DIAGNOSIS — I10 Essential (primary) hypertension: Secondary | ICD-10-CM | POA: Diagnosis not present

## 2019-10-06 DIAGNOSIS — F339 Major depressive disorder, recurrent, unspecified: Secondary | ICD-10-CM | POA: Diagnosis not present

## 2019-10-09 DIAGNOSIS — Z8616 Personal history of COVID-19: Secondary | ICD-10-CM | POA: Diagnosis not present

## 2019-10-09 DIAGNOSIS — R05 Cough: Secondary | ICD-10-CM | POA: Diagnosis not present

## 2019-10-31 DIAGNOSIS — F329 Major depressive disorder, single episode, unspecified: Secondary | ICD-10-CM | POA: Diagnosis not present

## 2019-10-31 DIAGNOSIS — E785 Hyperlipidemia, unspecified: Secondary | ICD-10-CM | POA: Diagnosis not present

## 2019-10-31 DIAGNOSIS — F39 Unspecified mood [affective] disorder: Secondary | ICD-10-CM | POA: Diagnosis not present

## 2019-11-06 DIAGNOSIS — Z79899 Other long term (current) drug therapy: Secondary | ICD-10-CM | POA: Diagnosis not present

## 2019-11-06 DIAGNOSIS — F329 Major depressive disorder, single episode, unspecified: Secondary | ICD-10-CM | POA: Diagnosis not present

## 2019-11-06 DIAGNOSIS — F419 Anxiety disorder, unspecified: Secondary | ICD-10-CM | POA: Diagnosis not present

## 2019-12-04 DIAGNOSIS — F419 Anxiety disorder, unspecified: Secondary | ICD-10-CM | POA: Diagnosis not present

## 2019-12-04 DIAGNOSIS — F329 Major depressive disorder, single episode, unspecified: Secondary | ICD-10-CM | POA: Diagnosis not present

## 2019-12-04 DIAGNOSIS — H409 Unspecified glaucoma: Secondary | ICD-10-CM | POA: Diagnosis not present

## 2019-12-04 DIAGNOSIS — I69351 Hemiplegia and hemiparesis following cerebral infarction affecting right dominant side: Secondary | ICD-10-CM | POA: Diagnosis not present

## 2019-12-18 DIAGNOSIS — I69351 Hemiplegia and hemiparesis following cerebral infarction affecting right dominant side: Secondary | ICD-10-CM | POA: Diagnosis not present

## 2019-12-18 DIAGNOSIS — D649 Anemia, unspecified: Secondary | ICD-10-CM | POA: Diagnosis not present

## 2019-12-18 DIAGNOSIS — I10 Essential (primary) hypertension: Secondary | ICD-10-CM | POA: Diagnosis not present

## 2019-12-18 DIAGNOSIS — F419 Anxiety disorder, unspecified: Secondary | ICD-10-CM | POA: Diagnosis not present

## 2019-12-26 DIAGNOSIS — I679 Cerebrovascular disease, unspecified: Secondary | ICD-10-CM | POA: Diagnosis not present

## 2019-12-26 DIAGNOSIS — I10 Essential (primary) hypertension: Secondary | ICD-10-CM | POA: Diagnosis not present

## 2019-12-26 DIAGNOSIS — E785 Hyperlipidemia, unspecified: Secondary | ICD-10-CM | POA: Diagnosis not present

## 2020-01-04 DIAGNOSIS — D649 Anemia, unspecified: Secondary | ICD-10-CM | POA: Diagnosis not present

## 2020-01-04 DIAGNOSIS — R7303 Prediabetes: Secondary | ICD-10-CM | POA: Diagnosis not present

## 2020-01-04 DIAGNOSIS — E039 Hypothyroidism, unspecified: Secondary | ICD-10-CM | POA: Diagnosis not present

## 2020-01-04 DIAGNOSIS — E559 Vitamin D deficiency, unspecified: Secondary | ICD-10-CM | POA: Diagnosis not present

## 2020-01-04 DIAGNOSIS — E785 Hyperlipidemia, unspecified: Secondary | ICD-10-CM | POA: Diagnosis not present

## 2020-01-04 DIAGNOSIS — E119 Type 2 diabetes mellitus without complications: Secondary | ICD-10-CM | POA: Diagnosis not present

## 2020-01-08 DIAGNOSIS — F419 Anxiety disorder, unspecified: Secondary | ICD-10-CM | POA: Diagnosis not present

## 2020-01-08 DIAGNOSIS — I10 Essential (primary) hypertension: Secondary | ICD-10-CM | POA: Diagnosis not present

## 2020-01-08 DIAGNOSIS — H409 Unspecified glaucoma: Secondary | ICD-10-CM | POA: Diagnosis not present

## 2020-01-08 DIAGNOSIS — I69351 Hemiplegia and hemiparesis following cerebral infarction affecting right dominant side: Secondary | ICD-10-CM | POA: Diagnosis not present

## 2020-02-21 DIAGNOSIS — F411 Generalized anxiety disorder: Secondary | ICD-10-CM | POA: Diagnosis not present

## 2020-02-21 DIAGNOSIS — G3184 Mild cognitive impairment, so stated: Secondary | ICD-10-CM | POA: Diagnosis not present

## 2020-02-21 DIAGNOSIS — F331 Major depressive disorder, recurrent, moderate: Secondary | ICD-10-CM | POA: Diagnosis not present

## 2020-02-23 DIAGNOSIS — I639 Cerebral infarction, unspecified: Secondary | ICD-10-CM | POA: Diagnosis not present

## 2020-02-23 DIAGNOSIS — R1312 Dysphagia, oropharyngeal phase: Secondary | ICD-10-CM | POA: Diagnosis not present

## 2020-02-23 DIAGNOSIS — F419 Anxiety disorder, unspecified: Secondary | ICD-10-CM | POA: Diagnosis not present

## 2020-02-23 DIAGNOSIS — I10 Essential (primary) hypertension: Secondary | ICD-10-CM | POA: Diagnosis not present

## 2020-02-27 DIAGNOSIS — I10 Essential (primary) hypertension: Secondary | ICD-10-CM | POA: Diagnosis not present

## 2020-02-27 DIAGNOSIS — F329 Major depressive disorder, single episode, unspecified: Secondary | ICD-10-CM | POA: Diagnosis not present

## 2020-02-27 DIAGNOSIS — E785 Hyperlipidemia, unspecified: Secondary | ICD-10-CM | POA: Diagnosis not present

## 2020-04-02 DIAGNOSIS — F331 Major depressive disorder, recurrent, moderate: Secondary | ICD-10-CM | POA: Diagnosis not present

## 2020-04-02 DIAGNOSIS — G3184 Mild cognitive impairment, so stated: Secondary | ICD-10-CM | POA: Diagnosis not present

## 2020-04-02 DIAGNOSIS — F411 Generalized anxiety disorder: Secondary | ICD-10-CM | POA: Diagnosis not present

## 2020-04-26 DIAGNOSIS — D649 Anemia, unspecified: Secondary | ICD-10-CM | POA: Diagnosis not present

## 2020-04-26 DIAGNOSIS — H409 Unspecified glaucoma: Secondary | ICD-10-CM | POA: Diagnosis not present

## 2020-04-26 DIAGNOSIS — F339 Major depressive disorder, recurrent, unspecified: Secondary | ICD-10-CM | POA: Diagnosis not present

## 2020-04-26 DIAGNOSIS — F419 Anxiety disorder, unspecified: Secondary | ICD-10-CM | POA: Diagnosis not present

## 2020-04-26 DIAGNOSIS — I69351 Hemiplegia and hemiparesis following cerebral infarction affecting right dominant side: Secondary | ICD-10-CM | POA: Diagnosis not present

## 2020-04-26 DIAGNOSIS — I10 Essential (primary) hypertension: Secondary | ICD-10-CM | POA: Diagnosis not present

## 2020-04-26 DIAGNOSIS — R7303 Prediabetes: Secondary | ICD-10-CM | POA: Diagnosis not present

## 2020-04-26 DIAGNOSIS — I639 Cerebral infarction, unspecified: Secondary | ICD-10-CM | POA: Diagnosis not present

## 2020-05-01 DIAGNOSIS — M62441 Contracture of muscle, right hand: Secondary | ICD-10-CM | POA: Diagnosis not present

## 2020-05-01 DIAGNOSIS — I69351 Hemiplegia and hemiparesis following cerebral infarction affecting right dominant side: Secondary | ICD-10-CM | POA: Diagnosis not present

## 2020-05-01 DIAGNOSIS — I639 Cerebral infarction, unspecified: Secondary | ICD-10-CM | POA: Diagnosis not present

## 2020-05-02 DIAGNOSIS — M62441 Contracture of muscle, right hand: Secondary | ICD-10-CM | POA: Diagnosis not present

## 2020-05-02 DIAGNOSIS — I69351 Hemiplegia and hemiparesis following cerebral infarction affecting right dominant side: Secondary | ICD-10-CM | POA: Diagnosis not present

## 2020-05-02 DIAGNOSIS — I639 Cerebral infarction, unspecified: Secondary | ICD-10-CM | POA: Diagnosis not present

## 2020-05-03 DIAGNOSIS — I639 Cerebral infarction, unspecified: Secondary | ICD-10-CM | POA: Diagnosis not present

## 2020-05-03 DIAGNOSIS — I69351 Hemiplegia and hemiparesis following cerebral infarction affecting right dominant side: Secondary | ICD-10-CM | POA: Diagnosis not present

## 2020-05-03 DIAGNOSIS — M62441 Contracture of muscle, right hand: Secondary | ICD-10-CM | POA: Diagnosis not present

## 2020-05-04 DIAGNOSIS — M62441 Contracture of muscle, right hand: Secondary | ICD-10-CM | POA: Diagnosis not present

## 2020-05-04 DIAGNOSIS — I69351 Hemiplegia and hemiparesis following cerebral infarction affecting right dominant side: Secondary | ICD-10-CM | POA: Diagnosis not present

## 2020-05-04 DIAGNOSIS — I639 Cerebral infarction, unspecified: Secondary | ICD-10-CM | POA: Diagnosis not present

## 2020-05-07 DIAGNOSIS — I69351 Hemiplegia and hemiparesis following cerebral infarction affecting right dominant side: Secondary | ICD-10-CM | POA: Diagnosis not present

## 2020-05-07 DIAGNOSIS — I639 Cerebral infarction, unspecified: Secondary | ICD-10-CM | POA: Diagnosis not present

## 2020-05-07 DIAGNOSIS — M62441 Contracture of muscle, right hand: Secondary | ICD-10-CM | POA: Diagnosis not present

## 2020-05-08 DIAGNOSIS — M62441 Contracture of muscle, right hand: Secondary | ICD-10-CM | POA: Diagnosis not present

## 2020-05-08 DIAGNOSIS — I69351 Hemiplegia and hemiparesis following cerebral infarction affecting right dominant side: Secondary | ICD-10-CM | POA: Diagnosis not present

## 2020-05-08 DIAGNOSIS — I639 Cerebral infarction, unspecified: Secondary | ICD-10-CM | POA: Diagnosis not present

## 2020-05-13 DIAGNOSIS — I69351 Hemiplegia and hemiparesis following cerebral infarction affecting right dominant side: Secondary | ICD-10-CM | POA: Diagnosis not present

## 2020-05-13 DIAGNOSIS — I639 Cerebral infarction, unspecified: Secondary | ICD-10-CM | POA: Diagnosis not present

## 2020-05-13 DIAGNOSIS — M62441 Contracture of muscle, right hand: Secondary | ICD-10-CM | POA: Diagnosis not present

## 2020-05-15 DIAGNOSIS — I639 Cerebral infarction, unspecified: Secondary | ICD-10-CM | POA: Diagnosis not present

## 2020-05-15 DIAGNOSIS — I69351 Hemiplegia and hemiparesis following cerebral infarction affecting right dominant side: Secondary | ICD-10-CM | POA: Diagnosis not present

## 2020-05-15 DIAGNOSIS — M62441 Contracture of muscle, right hand: Secondary | ICD-10-CM | POA: Diagnosis not present

## 2020-06-06 DIAGNOSIS — F411 Generalized anxiety disorder: Secondary | ICD-10-CM | POA: Diagnosis not present

## 2020-06-06 DIAGNOSIS — G3184 Mild cognitive impairment, so stated: Secondary | ICD-10-CM | POA: Diagnosis not present

## 2020-06-06 DIAGNOSIS — F331 Major depressive disorder, recurrent, moderate: Secondary | ICD-10-CM | POA: Diagnosis not present

## 2020-06-21 DIAGNOSIS — I739 Peripheral vascular disease, unspecified: Secondary | ICD-10-CM | POA: Diagnosis not present

## 2020-06-21 DIAGNOSIS — E559 Vitamin D deficiency, unspecified: Secondary | ICD-10-CM | POA: Diagnosis not present

## 2020-06-21 DIAGNOSIS — I10 Essential (primary) hypertension: Secondary | ICD-10-CM | POA: Diagnosis not present

## 2020-06-26 DIAGNOSIS — R1312 Dysphagia, oropharyngeal phase: Secondary | ICD-10-CM | POA: Diagnosis not present

## 2020-06-27 DIAGNOSIS — R1312 Dysphagia, oropharyngeal phase: Secondary | ICD-10-CM | POA: Diagnosis not present

## 2020-06-28 DIAGNOSIS — R1312 Dysphagia, oropharyngeal phase: Secondary | ICD-10-CM | POA: Diagnosis not present

## 2020-07-01 DIAGNOSIS — R1312 Dysphagia, oropharyngeal phase: Secondary | ICD-10-CM | POA: Diagnosis not present

## 2020-07-02 DIAGNOSIS — G3184 Mild cognitive impairment, so stated: Secondary | ICD-10-CM | POA: Diagnosis not present

## 2020-07-02 DIAGNOSIS — F331 Major depressive disorder, recurrent, moderate: Secondary | ICD-10-CM | POA: Diagnosis not present

## 2020-07-02 DIAGNOSIS — F411 Generalized anxiety disorder: Secondary | ICD-10-CM | POA: Diagnosis not present

## 2020-07-02 DIAGNOSIS — R1312 Dysphagia, oropharyngeal phase: Secondary | ICD-10-CM | POA: Diagnosis not present

## 2020-07-03 DIAGNOSIS — R1312 Dysphagia, oropharyngeal phase: Secondary | ICD-10-CM | POA: Diagnosis not present

## 2020-07-04 DIAGNOSIS — R1312 Dysphagia, oropharyngeal phase: Secondary | ICD-10-CM | POA: Diagnosis not present

## 2020-07-05 DIAGNOSIS — R1312 Dysphagia, oropharyngeal phase: Secondary | ICD-10-CM | POA: Diagnosis not present

## 2020-07-08 DIAGNOSIS — R1312 Dysphagia, oropharyngeal phase: Secondary | ICD-10-CM | POA: Diagnosis not present

## 2020-07-09 DIAGNOSIS — R1312 Dysphagia, oropharyngeal phase: Secondary | ICD-10-CM | POA: Diagnosis not present

## 2020-07-10 DIAGNOSIS — R1312 Dysphagia, oropharyngeal phase: Secondary | ICD-10-CM | POA: Diagnosis not present

## 2020-07-11 DIAGNOSIS — R1312 Dysphagia, oropharyngeal phase: Secondary | ICD-10-CM | POA: Diagnosis not present

## 2020-07-15 DIAGNOSIS — R1312 Dysphagia, oropharyngeal phase: Secondary | ICD-10-CM | POA: Diagnosis not present

## 2020-07-16 DIAGNOSIS — R1312 Dysphagia, oropharyngeal phase: Secondary | ICD-10-CM | POA: Diagnosis not present

## 2020-07-17 DIAGNOSIS — R1312 Dysphagia, oropharyngeal phase: Secondary | ICD-10-CM | POA: Diagnosis not present

## 2020-07-18 DIAGNOSIS — R1312 Dysphagia, oropharyngeal phase: Secondary | ICD-10-CM | POA: Diagnosis not present

## 2020-07-19 DIAGNOSIS — R1312 Dysphagia, oropharyngeal phase: Secondary | ICD-10-CM | POA: Diagnosis not present

## 2020-07-21 DIAGNOSIS — R1312 Dysphagia, oropharyngeal phase: Secondary | ICD-10-CM | POA: Diagnosis not present

## 2020-07-22 DIAGNOSIS — R41841 Cognitive communication deficit: Secondary | ICD-10-CM | POA: Diagnosis not present

## 2020-07-22 DIAGNOSIS — I10 Essential (primary) hypertension: Secondary | ICD-10-CM | POA: Diagnosis not present

## 2020-07-22 DIAGNOSIS — R1312 Dysphagia, oropharyngeal phase: Secondary | ICD-10-CM | POA: Diagnosis not present

## 2020-07-22 DIAGNOSIS — I639 Cerebral infarction, unspecified: Secondary | ICD-10-CM | POA: Diagnosis not present

## 2020-07-23 DIAGNOSIS — I639 Cerebral infarction, unspecified: Secondary | ICD-10-CM | POA: Diagnosis not present

## 2020-07-23 DIAGNOSIS — I10 Essential (primary) hypertension: Secondary | ICD-10-CM | POA: Diagnosis not present

## 2020-07-23 DIAGNOSIS — R1312 Dysphagia, oropharyngeal phase: Secondary | ICD-10-CM | POA: Diagnosis not present

## 2020-07-23 DIAGNOSIS — R41841 Cognitive communication deficit: Secondary | ICD-10-CM | POA: Diagnosis not present

## 2020-07-25 DIAGNOSIS — Z23 Encounter for immunization: Secondary | ICD-10-CM | POA: Diagnosis not present

## 2020-07-30 DIAGNOSIS — G3184 Mild cognitive impairment, so stated: Secondary | ICD-10-CM | POA: Diagnosis not present

## 2020-07-30 DIAGNOSIS — F411 Generalized anxiety disorder: Secondary | ICD-10-CM | POA: Diagnosis not present

## 2020-07-30 DIAGNOSIS — F331 Major depressive disorder, recurrent, moderate: Secondary | ICD-10-CM | POA: Diagnosis not present

## 2020-08-12 DIAGNOSIS — F329 Major depressive disorder, single episode, unspecified: Secondary | ICD-10-CM | POA: Diagnosis not present

## 2020-08-12 DIAGNOSIS — D649 Anemia, unspecified: Secondary | ICD-10-CM | POA: Diagnosis not present

## 2020-08-12 DIAGNOSIS — R1312 Dysphagia, oropharyngeal phase: Secondary | ICD-10-CM | POA: Diagnosis not present

## 2020-08-12 DIAGNOSIS — I10 Essential (primary) hypertension: Secondary | ICD-10-CM | POA: Diagnosis not present

## 2020-08-12 DIAGNOSIS — F419 Anxiety disorder, unspecified: Secondary | ICD-10-CM | POA: Diagnosis not present

## 2020-08-12 DIAGNOSIS — R41841 Cognitive communication deficit: Secondary | ICD-10-CM | POA: Diagnosis not present

## 2020-08-12 DIAGNOSIS — I639 Cerebral infarction, unspecified: Secondary | ICD-10-CM | POA: Diagnosis not present

## 2020-08-12 DIAGNOSIS — I69351 Hemiplegia and hemiparesis following cerebral infarction affecting right dominant side: Secondary | ICD-10-CM | POA: Diagnosis not present

## 2020-08-12 DIAGNOSIS — F339 Major depressive disorder, recurrent, unspecified: Secondary | ICD-10-CM | POA: Diagnosis not present

## 2020-08-13 DIAGNOSIS — I639 Cerebral infarction, unspecified: Secondary | ICD-10-CM | POA: Diagnosis not present

## 2020-08-13 DIAGNOSIS — R1312 Dysphagia, oropharyngeal phase: Secondary | ICD-10-CM | POA: Diagnosis not present

## 2020-08-13 DIAGNOSIS — R41841 Cognitive communication deficit: Secondary | ICD-10-CM | POA: Diagnosis not present

## 2020-08-13 DIAGNOSIS — I10 Essential (primary) hypertension: Secondary | ICD-10-CM | POA: Diagnosis not present

## 2020-08-14 DIAGNOSIS — I10 Essential (primary) hypertension: Secondary | ICD-10-CM | POA: Diagnosis not present

## 2020-08-14 DIAGNOSIS — R1312 Dysphagia, oropharyngeal phase: Secondary | ICD-10-CM | POA: Diagnosis not present

## 2020-08-14 DIAGNOSIS — R41841 Cognitive communication deficit: Secondary | ICD-10-CM | POA: Diagnosis not present

## 2020-08-14 DIAGNOSIS — I639 Cerebral infarction, unspecified: Secondary | ICD-10-CM | POA: Diagnosis not present

## 2020-08-20 DIAGNOSIS — R1312 Dysphagia, oropharyngeal phase: Secondary | ICD-10-CM | POA: Diagnosis not present

## 2020-08-20 DIAGNOSIS — R41841 Cognitive communication deficit: Secondary | ICD-10-CM | POA: Diagnosis not present

## 2020-08-20 DIAGNOSIS — I639 Cerebral infarction, unspecified: Secondary | ICD-10-CM | POA: Diagnosis not present

## 2020-08-20 DIAGNOSIS — I10 Essential (primary) hypertension: Secondary | ICD-10-CM | POA: Diagnosis not present

## 2020-08-22 DIAGNOSIS — I639 Cerebral infarction, unspecified: Secondary | ICD-10-CM | POA: Diagnosis not present

## 2020-08-22 DIAGNOSIS — R41841 Cognitive communication deficit: Secondary | ICD-10-CM | POA: Diagnosis not present

## 2020-08-22 DIAGNOSIS — I10 Essential (primary) hypertension: Secondary | ICD-10-CM | POA: Diagnosis not present

## 2020-08-23 DIAGNOSIS — I639 Cerebral infarction, unspecified: Secondary | ICD-10-CM | POA: Diagnosis not present

## 2020-08-23 DIAGNOSIS — R41841 Cognitive communication deficit: Secondary | ICD-10-CM | POA: Diagnosis not present

## 2020-08-23 DIAGNOSIS — I10 Essential (primary) hypertension: Secondary | ICD-10-CM | POA: Diagnosis not present

## 2020-08-27 DIAGNOSIS — I639 Cerebral infarction, unspecified: Secondary | ICD-10-CM | POA: Diagnosis not present

## 2020-08-27 DIAGNOSIS — I10 Essential (primary) hypertension: Secondary | ICD-10-CM | POA: Diagnosis not present

## 2020-08-27 DIAGNOSIS — R41841 Cognitive communication deficit: Secondary | ICD-10-CM | POA: Diagnosis not present

## 2020-08-28 DIAGNOSIS — I10 Essential (primary) hypertension: Secondary | ICD-10-CM | POA: Diagnosis not present

## 2020-08-28 DIAGNOSIS — R41841 Cognitive communication deficit: Secondary | ICD-10-CM | POA: Diagnosis not present

## 2020-08-28 DIAGNOSIS — I639 Cerebral infarction, unspecified: Secondary | ICD-10-CM | POA: Diagnosis not present

## 2020-08-29 DIAGNOSIS — R41841 Cognitive communication deficit: Secondary | ICD-10-CM | POA: Diagnosis not present

## 2020-08-29 DIAGNOSIS — I10 Essential (primary) hypertension: Secondary | ICD-10-CM | POA: Diagnosis not present

## 2020-08-29 DIAGNOSIS — I639 Cerebral infarction, unspecified: Secondary | ICD-10-CM | POA: Diagnosis not present

## 2020-08-30 DIAGNOSIS — I639 Cerebral infarction, unspecified: Secondary | ICD-10-CM | POA: Diagnosis not present

## 2020-08-30 DIAGNOSIS — I10 Essential (primary) hypertension: Secondary | ICD-10-CM | POA: Diagnosis not present

## 2020-08-30 DIAGNOSIS — R41841 Cognitive communication deficit: Secondary | ICD-10-CM | POA: Diagnosis not present

## 2020-09-03 DIAGNOSIS — R41841 Cognitive communication deficit: Secondary | ICD-10-CM | POA: Diagnosis not present

## 2020-09-03 DIAGNOSIS — F411 Generalized anxiety disorder: Secondary | ICD-10-CM | POA: Diagnosis not present

## 2020-09-03 DIAGNOSIS — I639 Cerebral infarction, unspecified: Secondary | ICD-10-CM | POA: Diagnosis not present

## 2020-09-03 DIAGNOSIS — G3184 Mild cognitive impairment, so stated: Secondary | ICD-10-CM | POA: Diagnosis not present

## 2020-09-03 DIAGNOSIS — I10 Essential (primary) hypertension: Secondary | ICD-10-CM | POA: Diagnosis not present

## 2020-09-03 DIAGNOSIS — F331 Major depressive disorder, recurrent, moderate: Secondary | ICD-10-CM | POA: Diagnosis not present

## 2020-09-04 DIAGNOSIS — R41841 Cognitive communication deficit: Secondary | ICD-10-CM | POA: Diagnosis not present

## 2020-09-04 DIAGNOSIS — I639 Cerebral infarction, unspecified: Secondary | ICD-10-CM | POA: Diagnosis not present

## 2020-09-04 DIAGNOSIS — I10 Essential (primary) hypertension: Secondary | ICD-10-CM | POA: Diagnosis not present

## 2020-09-05 DIAGNOSIS — I639 Cerebral infarction, unspecified: Secondary | ICD-10-CM | POA: Diagnosis not present

## 2020-09-05 DIAGNOSIS — R41841 Cognitive communication deficit: Secondary | ICD-10-CM | POA: Diagnosis not present

## 2020-09-05 DIAGNOSIS — I10 Essential (primary) hypertension: Secondary | ICD-10-CM | POA: Diagnosis not present

## 2020-09-06 DIAGNOSIS — I10 Essential (primary) hypertension: Secondary | ICD-10-CM | POA: Diagnosis not present

## 2020-09-06 DIAGNOSIS — I639 Cerebral infarction, unspecified: Secondary | ICD-10-CM | POA: Diagnosis not present

## 2020-09-06 DIAGNOSIS — R41841 Cognitive communication deficit: Secondary | ICD-10-CM | POA: Diagnosis not present

## 2020-09-10 DIAGNOSIS — I10 Essential (primary) hypertension: Secondary | ICD-10-CM | POA: Diagnosis not present

## 2020-09-10 DIAGNOSIS — I639 Cerebral infarction, unspecified: Secondary | ICD-10-CM | POA: Diagnosis not present

## 2020-09-10 DIAGNOSIS — R41841 Cognitive communication deficit: Secondary | ICD-10-CM | POA: Diagnosis not present

## 2020-09-11 DIAGNOSIS — R41841 Cognitive communication deficit: Secondary | ICD-10-CM | POA: Diagnosis not present

## 2020-09-11 DIAGNOSIS — I639 Cerebral infarction, unspecified: Secondary | ICD-10-CM | POA: Diagnosis not present

## 2020-09-11 DIAGNOSIS — I10 Essential (primary) hypertension: Secondary | ICD-10-CM | POA: Diagnosis not present

## 2020-09-12 DIAGNOSIS — R41841 Cognitive communication deficit: Secondary | ICD-10-CM | POA: Diagnosis not present

## 2020-09-12 DIAGNOSIS — I10 Essential (primary) hypertension: Secondary | ICD-10-CM | POA: Diagnosis not present

## 2020-09-12 DIAGNOSIS — I639 Cerebral infarction, unspecified: Secondary | ICD-10-CM | POA: Diagnosis not present

## 2020-09-13 DIAGNOSIS — I10 Essential (primary) hypertension: Secondary | ICD-10-CM | POA: Diagnosis not present

## 2020-09-13 DIAGNOSIS — I639 Cerebral infarction, unspecified: Secondary | ICD-10-CM | POA: Diagnosis not present

## 2020-09-13 DIAGNOSIS — R41841 Cognitive communication deficit: Secondary | ICD-10-CM | POA: Diagnosis not present

## 2020-09-19 DIAGNOSIS — F419 Anxiety disorder, unspecified: Secondary | ICD-10-CM | POA: Diagnosis not present

## 2020-09-19 DIAGNOSIS — F339 Major depressive disorder, recurrent, unspecified: Secondary | ICD-10-CM | POA: Diagnosis not present

## 2020-09-19 DIAGNOSIS — E785 Hyperlipidemia, unspecified: Secondary | ICD-10-CM | POA: Diagnosis not present

## 2020-09-19 DIAGNOSIS — Z5320 Procedure and treatment not carried out because of patient's decision for unspecified reasons: Secondary | ICD-10-CM | POA: Diagnosis not present

## 2020-09-19 DIAGNOSIS — R7303 Prediabetes: Secondary | ICD-10-CM | POA: Diagnosis not present

## 2020-09-19 DIAGNOSIS — I639 Cerebral infarction, unspecified: Secondary | ICD-10-CM | POA: Diagnosis not present

## 2020-09-19 DIAGNOSIS — F329 Major depressive disorder, single episode, unspecified: Secondary | ICD-10-CM | POA: Diagnosis not present

## 2020-09-19 DIAGNOSIS — I69351 Hemiplegia and hemiparesis following cerebral infarction affecting right dominant side: Secondary | ICD-10-CM | POA: Diagnosis not present

## 2020-10-29 ENCOUNTER — Non-Acute Institutional Stay: Payer: Self-pay | Admitting: Hospice

## 2020-10-29 ENCOUNTER — Other Ambulatory Visit: Payer: Self-pay

## 2020-10-29 DIAGNOSIS — I639 Cerebral infarction, unspecified: Secondary | ICD-10-CM

## 2020-10-29 DIAGNOSIS — Z515 Encounter for palliative care: Secondary | ICD-10-CM

## 2020-10-29 DIAGNOSIS — F339 Major depressive disorder, recurrent, unspecified: Secondary | ICD-10-CM

## 2020-10-29 NOTE — Progress Notes (Signed)
PATIENT NAME: Jorge Burns [address] [place] [address] [phone] (home)  DOB: Apr 20, 1941 MRN: [id number]  PRIMARY CARE PROVIDER:    Dr. Charlynne Pander  REFERRING PROVIDER:   Dr. Charlynne Pander  RESPONSIBLE PARTY:   Extended Emergency Contact Information Primary Emergency Contact: [place], [name] Home Phone: [phone] Mobile Phone: [phone] Relation: Granddaughter Preferred language: English Interpreter needed? No Secondary Emergency Contact: Carrolyn Meiers Mobile Phone: [phone] Relation: Nephew  [name] likes to be called from 1pm.   CHIEF COMPLAIN: Initial palliative care visit/Depression  Visit is to build trust and highlight Palliative Medicine as specialized medical care for people living with serious illness, aimed at facilitating better quality of life through symptoms relief, assisting with advance care plan and establishing goals of care. Patient with limited language/communication related to hx of CVA and Dementia. NP called and spoke with [name], pdating her on visit. She endorsed palliative service. Discussion on the difference between Palliative and Hospice care.  Visit consisted of counseling and education dealing with the complex and emotionally intense issues of symptom management and palliative care in the setting of serious and potentially life-threatening illness. Palliative care team will continue to support patient, patient's family, and medical team.  RECOMMENDATIONS/PLAN:   Advance Care Planning: Our advance care planning conversation included a discussion about  the value and importance of advance care planning, exploration of goals of care in the event of a sudden injury or illness, identification and preparation of a healthcare agent and review and updating or creation of an advance directive document.  CODE STATUS: Patient is a DO NOT RESUSCITATE, CONFIRMED BY GRAND DAUGHTER [name]. NP signed DNR form for facility records and uploaded same document to  [facility] EMR Epic.   GOALS OF CARE: Goals of care include to maximize quality of life and symptom management.  I spent 46  minutes providing this initial consultation. More than 50% of the time in this consultation was spent on counseling patient and coordinating communication. -------------------------------------------------------------------------------------------------------------------------------------- Symptom Management:  Depression: Patient is followed by facility Psych NP. Continue Resperidone 0.5mg  daily as ordered. Psych consult as planned/as needed. Continue supportive care, close monitoring for worsening symptom and alert provider. Palliative will continue to monitor for symptom management/decline and make recommendations as needed. Return 6 weeks or prn. Encouraged to call provider sooner with any concerns.   PPS: 30%  Family /Caregiver/Community Supports: Patient in SNF for ongoing care   HISTORY OF PRESENT ILLNESS:  Jorge Burns is a 80 y.o. 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 Patient is a poor historian due to hx of CVA, Dementia.  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 History obtained from review of EMR, discussion with primary nurse and patient/[name]. Review and summarization of old Epic records shows history from other than patient. Rest of 10 point ROS asked patient/nurse and negative.  HOSPICE ELIGIBILITY/DIAGNOSIS: TBD  PAST MEDICAL HISTORY:  Past Medical History:  Diagnosis Date  . Hypertension   . Stroke [facility])    "a long time ago -- 2 years ago"     SOCIAL HX:  Social History   Tobacco Use  . Smoking status: Never Smoker  . Smokeless tobacco: Never Used  Substance Use Topics  . Alcohol use: No     FAMILY HX: No family history on file.  Labs:   No results for input(s): WBC, HGB, HCT, PLT, MCV in the last 168 hours. No results for input(s): NA, K, CL, CO2, BUN, CREATININE, GLUCOSE in the last 168 hours. Latest GFR by Cockcroft Gault (not valid in AKI or ESRD) CrCl cannot be calculated (Patient's most recent lab result is older than the maximum 21 days allowed.). No results for input(s): AST, ALT, ALKPHOS, GGT in the last 168 hours.  Invalid input(s): TBILI, CONJBILI, ALB, TOTALPROTEIN No components found for: ALB No results for input(s): APTT, INR in the last 168 hours.  Invalid input(s): PTPATIENT No results for input(s): BNP, PROBNP in the last 168 hours. Review or lab tests,/diagnostics  ALLERGIES: No Known Allergies    PERTINENT MEDICATIONS:  Outpatient Encounter Medications as of 10/29/2020  Medication Sig  . aspirin 81 MG chewable tablet Chew 1 tablet (81 mg total) by mouth daily.  Marland Kitchen atorvastatin (LIPITOR) 80 MG tablet Take 1 tablet (80 mg total) by mouth daily at 6 PM.  . brimonidine (ALPHAGAN) 0.2 % ophthalmic solution Place 1 drop into the left eye 2 (two) times daily.  . citalopram (CELEXA) 10 MG tablet Take 1 tablet (10 mg total) by mouth daily.  . clopidogrel (PLAVIX) 75 MG tablet Take 1 tablet (75 mg total) by mouth daily.  . dorzolamide (TRUSOPT) 2 % ophthalmic solution Place 1 drop into the left eye 3 (three) times daily.  . dorzolamidel-timolol (COSOPT) 22.3-6.8 MG/ML SOLN ophthalmic solution Place 1 drop into the left eye 2 (two) times daily.  Marland Kitchen HYDROcodone-acetaminophen (NORCO/VICODIN) 5-325 MG tablet Take 1 tablet by mouth every 6 (six) hours as needed.  . hydrocortisone (ANUSOL-HC) 2.5 % rectal cream Apply rectally 2 times daily  . latanoprost (XALATAN) 0.005 % ophthalmic solution Place 1 drop into the left eye at bedtime.  Marland Kitchen lisinopril (PRINIVIL,ZESTRIL) 2.5 MG tablet Take 1 tablet (2.5 mg total) by mouth daily.  . methylphenidate (RITALIN) 10 MG tablet  Take 1 tablet (10 mg total) by mouth 2 (two) times daily with breakfast and lunch.  . pilocarpine (PILOCAR) 1 % ophthalmic solution Place 1 drop into the left eye 4 (four) times daily.  . potassium chloride SA (K-DUR,KLOR-CON) 20 MEQ tablet Take 1 tablet (20 mEq total) by mouth daily.   No facility-administered encounter medications on file as of 10/29/2020.    ROS  General: NAD Constitution: Denies fever/chills EYES: denies vision changes ENMT: denies Xerostomia Cardiovascular: denies chest pain Pulmonary: denies  cough, denies dyspnea  Abdomen: endorses fair appetite, denies constipation or diarrhea GU: denies dysuria, urinary incontinence MSK:  endorses ROM limitations, no falls reported; no joint pain Skin: denies rashes/bruising Neurological: endorses weakness, denies pain, denies insomnia Psych: endorses no anxiety Heme/lymph/immuno: denies bruises, no abnormal bleeding   PHYSICAL EXAM  Height:    4 feet 10 inches  Weight: 103.8 Ibs Constitutional: In no acute distress, well developed and well nourished Cardiovascular: regular rate and rhythm Pulmonary: no cough, no increased work of breathing, normal respiratory effort Abdomen: soft, non tender, positive bowel sounds in all quadrants GU:  no suprapubic tenderness Eyes: Normal lids, no discharge, sclera anicteric ENMT: Moist mucous membranes Musculoskeletal:  no edema in BLE, non ambulatory Skin: no rash to visible skin, dry skin,warm without cyanosis Psych: Flat affect Neurological: Weakness but otherwise non focal Heme/lymph/immuno: no bruises, no bleeding   Palliative Care was asked to follow this patient by consultation request of Dr. Charlynne Pander  to help address advance care planning and complex decision making. Thank you  for the opportunity to participate in the care of [facility] [place] Please call our office at [phone] if we can be of additional assistance.  Note: Portions of this note were generated with Administrator, sports. Dictation errors may occur despite best attempts at proofreading.  Rosaura Carpenter, NP

## 2021-01-10 ENCOUNTER — Other Ambulatory Visit: Payer: Self-pay

## 2021-01-10 ENCOUNTER — Non-Acute Institutional Stay: Payer: Medicare Other | Admitting: Hospice

## 2021-01-10 DIAGNOSIS — Z515 Encounter for palliative care: Secondary | ICD-10-CM

## 2021-01-10 DIAGNOSIS — F339 Major depressive disorder, recurrent, unspecified: Secondary | ICD-10-CM

## 2021-01-10 DIAGNOSIS — F0391 Unspecified dementia with behavioral disturbance: Secondary | ICD-10-CM

## 2021-01-10 DIAGNOSIS — E43 Unspecified severe protein-calorie malnutrition: Secondary | ICD-10-CM

## 2021-01-10 NOTE — Progress Notes (Signed)
Therapist, nutritional Palliative Care Consult Note Telephone: [phone]  Fax: [phone]  PATIENT NAME: Jorge Burns DOB: 12/27/1940 MRN: [id number]  PRIMARY CARE PROVIDER:   Dr. Charlynne Pander REFERRING PROVIDER:   Dr. Charlynne Pander  RESPONSIBLE PARTY:   Extended Emergency Contact Information Primary Emergency Contact: [place], [name] Home Phone: [phone] Mobile Phone: [phone] Relation: Granddaughter Preferred language: English Interpreter needed? No Secondary Emergency Contact: Carrolyn Meiers Mobile Phone: [phone] Relation: Nephew  [name] likes to be called from  RESPONSIBLE PARTY:   Contact Information    Name Relation Home Work [place], [place] Granddaughter [phone]  [phone]   [name]   [phone]        Visit is to build trust and highlight Palliative Medicine as specialized medical care for people living with serious illness, aimed at facilitating better quality of life through symptoms relief, assisting with advance care planning and complex medical decision making.   RECOMMENDATIONS/PLAN:   CODE STATUS: Patient is a DO NOT RESUSCITATE  Goals of Care: Goals of care include to maximize quality of life and symptom management.  Visit consisted of counseling and education dealing with the complex and emotionally intense issues of symptom management and palliative care in the setting of serious and potentially life-threatening illness. Palliative care team will continue to support patient, patient's family, and medical team.  Symptom management/Plan:  Depression:  Improving. Refusal of medication: Continue to offer, gentle persuasion, calm approach Protein caloric malnutrition: Offer smal meals of choice 4-6 times a day. Continue Ensure. ST consult as needed Dementia with behavioral agitation: Patient is followed by facility Psych NP. Continue Resperidone 0.5mg  daily as ordered.  Ativan nightly recently added with good  results.  Psych consult as planned/as needed. Continue supportive care.  Routine labs, CBC BMP  Palliative will continue to monitor for symptom management/decline and make recommendations as needed. Return6weeks or prn. Encouraged to call provider sooner with any concerns.   PPS: 30% patient sitting up in wheelchair today  Family /Caregiver/Community Supports: Patient in SNF for ongoing care   CHIEF COMPLAINT: Palliative follow up visit/depression HISTORY OF PRESENT ILLNESS:  [name] is a 80 y.o. 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   Today patient was sitting up in wheelchair and allowed nursing aide taking outside, endorsing that his mood is better than before.  Patient is a poor historian due to hx of CVA, Dementia, able to answer yes and no questions.  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  Review and summarization of Epic records shows history from other than patient.   Palliative Care was asked to follow this patient by consultation request of Dr. Charlynne Pander to help address complex decision making in the context of advance care planning and goals of care clarification.   CODE STATUS: DNR  PPS: 40%  HOSPICE ELIGIBILITY/DIAGNOSIS: TBD  PAST MEDICAL HISTORY:  Past Medical History:  Diagnosis Date  . Hypertension   . Stroke [facility])    "a long time ago -- 2 years ago"     SOCIAL HX: @SOCX  Patient lives at  [facility] for ongoing care  Family /Caregiver/Community Supports:   FAMILY HX: No family history on file.  Review lab tests/diagnostics No results for input(s): WBC, HGB,  HCT, PLT, MCV in the last 168 hours. No results for input(s): NA, K, CL, CO2, BUN,  CREATININE, GLUCOSE in the last 168 hours. CrCl cannot be calculated (Patient's most recent lab result is older than the maximum 21 days allowed.). No results for input(s): AST, ALT, ALKPHOS, GGT in the last 168 hours.  Invalid input(s): TBILI, CONJBILI, ALB, TOTALPROTEIN No components found for: ALB No results for input(s): APTT, INR in the last 168 hours.  Invalid input(s): PTPATIENT No results for input(s): BNP, PROBNP in the last 168 hours.   ALLERGIES: No Known Allergies    PERTINENT MEDICATIONS:  Outpatient Encounter Medications as of 01/10/2021  Medication Sig  . aspirin 81 MG chewable tablet Chew 1 tablet (81 mg total) by mouth daily.  Marland Kitchen atorvastatin (LIPITOR) 80 MG tablet Take 1 tablet (80 mg total) by mouth daily at 6 PM.  . brimonidine (ALPHAGAN) 0.2 % ophthalmic solution Place 1 drop into the left eye 2 (two) times daily.  . citalopram (CELEXA) 10 MG tablet Take 1 tablet (10 mg total) by mouth daily.  . clopidogrel (PLAVIX) 75 MG tablet Take 1 tablet (75 mg total) by mouth daily.  . dorzolamide (TRUSOPT) 2 % ophthalmic solution Place 1 drop into the left eye 3 (three) times daily.  . dorzolamidel-timolol (COSOPT) 22.3-6.8 MG/ML SOLN ophthalmic solution Place 1 drop into the left eye 2 (two) times daily.  Marland Kitchen HYDROcodone-acetaminophen (NORCO/VICODIN) 5-325 MG tablet Take 1 tablet by mouth every 6 (six) hours as needed.  . hydrocortisone (ANUSOL-HC) 2.5 % rectal cream Apply rectally 2 times daily  . latanoprost (XALATAN) 0.005 % ophthalmic solution Place 1 drop into the left eye at bedtime.  Marland Kitchen lisinopril (PRINIVIL,ZESTRIL) 2.5 MG tablet Take 1 tablet (2.5 mg total) by mouth daily.  . methylphenidate (RITALIN) 10 MG tablet Take 1 tablet (10 mg total) by mouth 2 (two) times daily with breakfast and lunch.  . pilocarpine (PILOCAR) 1 % ophthalmic solution Place 1 drop into the left eye 4 (four) times daily.  . potassium chloride SA (K-DUR,KLOR-CON) 20 MEQ tablet Take 1 tablet (20 mEq  total) by mouth daily.   No facility-administered encounter medications on file as of 01/10/2021.      ROS  General: NAD Constitution: Denies fever/chills EYES: denies vision changes ENMT: denies Xerostomia Cardiovascular: denies chest pain Pulmonary: denies cough, denies dyspnea  Abdomen: endorses fair appetite, denies constipation or diarrhea GU: denies dysuria, urinary incontinence MSK: endorses ROM limitations, no falls reported; no joint pain Skin: denies rashes/bruising Neurological: endorses weakness, denies pain, denies insomnia Psych: endorses no anxiety Heme/lymph/immuno: denies bruises, no abnormal bleeding   PHYSICAL EXAM  Height:    4 feet 10 inches  Weight: 98 Ibs Constitutional: In no acute distress Cardiovascular: regular rate and rhythm Pulmonary: no cough, no increased work of breathing, normal respiratory effort Abdomen: soft, non tender, positive bowel sounds in all quadrants GU:  no suprapubic tenderness Eyes: Normal lids, no discharge, sclera anicteric ENMT: Moist mucous membranes Musculoskeletal:  no edema in BLE, non ambulatory, R side hemiplgia/hemparesis Skin: no rash to visible skin, dry skin,warm without cyanosis Psych: non anxious affect Neurological: Weakness but otherwise non focal Heme/lymph/immuno: no bruises, no bleeding  I spent 55 minutes providing this consultation; this includes time spent with patient/family, chart review and documentation. More than 50% of the time in this consultation was spent on counseling and coordinating communication   Thank you for the opportunity to participate in the care of [facility] [place] Please call our office  at [phone] if we can be of additional assistance.  Note: Portions of this note were generated with Lobbyist. Dictation errors may occur despite best attempts at proofreading.  Teodoro Spray, NP

## 2021-03-04 ENCOUNTER — Other Ambulatory Visit: Payer: Self-pay

## 2021-03-04 ENCOUNTER — Non-Acute Institutional Stay: Payer: Medicare Other | Admitting: Hospice

## 2021-03-04 DIAGNOSIS — E43 Unspecified severe protein-calorie malnutrition: Secondary | ICD-10-CM

## 2021-03-04 DIAGNOSIS — Z515 Encounter for palliative care: Secondary | ICD-10-CM

## 2021-03-04 DIAGNOSIS — F339 Major depressive disorder, recurrent, unspecified: Secondary | ICD-10-CM

## 2021-03-04 NOTE — Progress Notes (Signed)
Therapist, nutritional Palliative Care Consult Note Telephone: [phone]  Fax: [phone]  PATIENT NAME: Jorge Burns DOB: 1941/03/29 MRN: [id number]  PRIMARY CARE PROVIDER:   Dr. Charlynne Pander  REFERRING PROVIDER:   Dr. Charlynne Pander  RESPONSIBLE PARTY:  Jorge Burns Contact Information     Name Relation Home Work Jorge Burns, [place] Granddaughter [phone]  [phone]   [name]   [phone]       Visit is to build trust and highlight Palliative Medicine as specialized medical care for people living with serious illness, aimed at facilitating better quality of life through symptoms relief, assisting with advance care planning and complex medical decision making.   RECOMMENDATIONS/PLAN:   Advance Care Planning/Code Status: Patient is a DO NOT RESUSCITATE  Goals of Care: Goals of care include to maximize quality of life and symptom management.  Symptom management/Plan:  CVA protein caloric malnutrition: Weight increased from 98 pounds 2 months ago to 102 pounds current.  Continue Ensure, offer small meals 4-6 times a day.provide assistance during meals to ensure adequate oral intake.  ST consult as needed. Routine CBC CMP. Dementia: Continue ongoing supportive care. Depression: Remeron recently discontinued with no decompensation.  No reports of depressive mood.  Monitor closely for depression.  Psych consult as needed. Follow up: Palliative care will continue to follow for complex medical decision making, advance care planning, and clarification of goals. Return 6 weeks or prn. Encouraged to call provider sooner with any concerns.  CHIEF COMPLAINT: Palliative follow up visit/protein caloric malnutrition  HISTORY OF PRESENT ILLNESS:  Jorge Burns a 80 y.o. male with multiple medical problems including severe protein caloric malnutrition, vascular dementia, CVA, right hemiplegia/hemiparesis, right hand contracture, muscle weakness, refusal of medication.   Nursing reports patient is eating more and has gained 4 pounds since last visit.  He denies pain/discomfort and a small receptive to care.  History obtained from review of EMR, discussion with primary team, family and/or patient. Records reviewed and summarized above. All 10 point systems reviewed and are negative except as documented in history of present illness above  Review and summarization of Epic records shows history from other than patient.   Palliative Care was asked to follow this patient o help address complex decision making in the context of advance care planning and goals of care clarification.   PPS: 40%   PHYSICAL EXAM Height:    4 feet 10 inches  Weight: 102 Ibs an increase from 98 Ibs April 2022 Constitutional: In no acute distress Cardiovascular: regular rate and rhythm Pulmonary: no cough, no increased work of breathing, normal respiratory effort Abdomen: soft, non tender, positive bowel sounds in all quadrants GU:  no suprapubic tenderness Eyes: Normal lids, no discharge, sclera anicteric ENMT: Moist mucous membranes Musculoskeletal:  no edema in BLE, non ambulatory, R side hemiplgia/hemparesis Skin: no rash to visible skin, dry skin,warm without cyanosis Psych: non anxious affect Neurological: Weakness but otherwise non focal Heme/lymph/immuno: no bruises, no bleeding    PERTINENT MEDICATIONS:  Outpatient Encounter Medications as of 03/04/2021  Medication Sig   aspirin 81 MG chewable tablet Chew 1 tablet (81 mg total) by mouth daily.   atorvastatin (LIPITOR) 80 MG tablet Take 1 tablet (80 mg total) by mouth daily at 6 PM.   brimonidine (ALPHAGAN) 0.2 % ophthalmic solution Place 1 drop into the left eye 2 (two) times daily.   citalopram (CELEXA) 10 MG tablet Take 1 tablet (10 mg total) by mouth daily.  clopidogrel (PLAVIX) 75 MG tablet Take 1 tablet (75 mg total) by mouth daily.   dorzolamide (TRUSOPT) 2 % ophthalmic solution Place 1 drop into the left eye 3  (three) times daily.   dorzolamidel-timolol (COSOPT) 22.3-6.8 MG/ML SOLN ophthalmic solution Place 1 drop into the left eye 2 (two) times daily.   HYDROcodone-acetaminophen (NORCO/VICODIN) 5-325 MG tablet Take 1 tablet by mouth every 6 (six) hours as needed.   hydrocortisone (ANUSOL-HC) 2.5 % rectal cream Apply rectally 2 times daily   latanoprost (XALATAN) 0.005 % ophthalmic solution Place 1 drop into the left eye at bedtime.   lisinopril (PRINIVIL,ZESTRIL) 2.5 MG tablet Take 1 tablet (2.5 mg total) by mouth daily.   methylphenidate (RITALIN) 10 MG tablet Take 1 tablet (10 mg total) by mouth 2 (two) times daily with breakfast and lunch.   pilocarpine (PILOCAR) 1 % ophthalmic solution Place 1 drop into the left eye 4 (four) times daily.   potassium chloride SA (K-DUR,KLOR-CON) 20 MEQ tablet Take 1 tablet (20 mEq total) by mouth daily.   No facility-administered encounter medications on file as of 03/04/2021.    HOSPICE ELIGIBILITY/DIAGNOSIS: TBD  PAST MEDICAL HISTORY:  Past Medical History:  Diagnosis Date   Hypertension    Stroke [facility])    "a long time ago -- 2 years ago"     ALLERGIES: No Known Allergies    I spent  50 minutes providing this consultation; this includes time spent with patient/family, chart review and documentation. More than 50% of the time in this consultation was spent on counseling and coordinating communication   Thank you for the opportunity to participate in the care of [name] Please call our office at [phone] if we can be of additional assistance.  Note: Portions of this note were generated with Scientist, clinical (histocompatibility and immunogenetics). Dictation errors may occur despite best attempts at proofreading.  Rosaura Carpenter, NP

## 2021-04-29 ENCOUNTER — Other Ambulatory Visit: Payer: Self-pay

## 2021-04-29 ENCOUNTER — Non-Acute Institutional Stay: Payer: Medicare Other | Admitting: Hospice

## 2021-04-29 DIAGNOSIS — Z515 Encounter for palliative care: Secondary | ICD-10-CM

## 2021-04-29 DIAGNOSIS — F0391 Unspecified dementia with behavioral disturbance: Secondary | ICD-10-CM

## 2021-04-29 DIAGNOSIS — E43 Unspecified severe protein-calorie malnutrition: Secondary | ICD-10-CM

## 2021-04-29 NOTE — Progress Notes (Signed)
Therapist, nutritional Palliative Care Consult Note Telephone: [phone]  Fax: [phone]  PATIENT NAME: Jorge Burns DOB: 1941/01/06 MRN: [id number]  PRIMARY CARE PROVIDER:   Dr. Charlynne Pander   REFERRING PROVIDER:   Dr. Charlynne Pander   RESPONSIBLE PARTY:  Jorge Burns Contact Information     Contact Information     Name Relation Home Work [place], [place] Granddaughter [phone]  [phone]   [name]   [phone]       Visit is to build trust and highlight Palliative Medicine as specialized medical care for people living with serious illness, aimed at facilitating better quality of life through symptoms relief, assisting with advance care planning and complex medical decision making. This is a follow up visit.  RECOMMENDATIONS/PLAN:   Advance Care Planning/Code Status: Patient is a Do Not Resuscitate  Goals of Care: Goals of care include to maximize quality of life and symptom management.  Visit consisted of counseling and education dealing with the complex and emotionally intense issues of symptom management and palliative care in the setting of serious and potentially life-threatening illness. Palliative care team will continue to support patient, patient's family, and medical team.  Symptom management/Plan:  Protein caloric malnutrition: Weight increased from 98 pounds 4 months ago to 104 pounds current.  Continue Ensure, offer small meals 4-6 times a day, provide assistance during meals to ensure adequate oral intake.  Monthly weight. ST consult as needed. Dementia: Continue ongoing supportive care FAST 7a Depression: Followed by Psych, last visit July 2022.  Follow up: Palliative care will continue to follow for complex medical decision making, advance care planning, and clarification of goals. Return 6 weeks or prn. Encouraged to call provider sooner with any concerns.  CHIEF COMPLAINT: Palliative follow up  HISTORY OF PRESENT ILLNESS:   Jorge Burns a 80 y.o. male with multiple medical problems including protein caloric malnutrition, hemiplegia and hemiparesis following cerebral infarction affecting right dominant side, CVA in 2019, depression, anxiety, Dysphagia, Vascular Dementia. Patient is a poor historian due to cognitive and language deficits, answers simple yes and no question. He denies pain/discomfort, did not want to be bothered. History obtained from review of EMR, discussion with nursing staff, family and/or patient. Records reviewed and summarized above. All 10 point systems reviewed and are negative except as documented in history of present illness above  Review and summarization of Epic records shows history from other than patient.   Palliative Care was asked to follow this patient o help address complex decision making in the context of advance care planning and goals of care clarification.   PHYSICAL EXAM  Height/weight:    4 feet 10 inches  Weight: 104 Ibs an increase from 98 Ibs April 2022 Constitutional: In no acute distress Cardiovascular: regular rate and rhythm, no edema in BLE Pulmonary: no cough, no increased work of breathing, normal respiratory effort Abdomen: soft, non tender, positive bowel sounds in all quadrants GU:  no suprapubic tenderness Eyes: Normal lids, no discharge, sclera anicteric ENMT: Moist mucous membranes Musculoskeletal: bed bound, non ambulatory, R side hemiplgia/hemparesis Skin: no rash to visible skin, dry skin,warm without cyanosis Psych: non anxious affect Neurological: Weakness but otherwise non focal Heme/lymph/immuno: no bruises, no bleeding  PERTINENT MEDICATIONS:  Outpatient Encounter Medications as of 04/29/2021  Medication Sig   aspirin 81 MG chewable tablet Chew 1 tablet (81 mg total) by mouth daily.   atorvastatin (LIPITOR) 80 MG tablet Take 1 tablet (80 mg total) by mouth daily  at 6 PM.   brimonidine (ALPHAGAN) 0.2 % ophthalmic solution Place 1 drop into the left  eye 2 (two) times daily.   citalopram (CELEXA) 10 MG tablet Take 1 tablet (10 mg total) by mouth daily.   clopidogrel (PLAVIX) 75 MG tablet Take 1 tablet (75 mg total) by mouth daily.   dorzolamide (TRUSOPT) 2 % ophthalmic solution Place 1 drop into the left eye 3 (three) times daily.   dorzolamidel-timolol (COSOPT) 22.3-6.8 MG/ML SOLN ophthalmic solution Place 1 drop into the left eye 2 (two) times daily.   HYDROcodone-acetaminophen (NORCO/VICODIN) 5-325 MG tablet Take 1 tablet by mouth every 6 (six) hours as needed.   hydrocortisone (ANUSOL-HC) 2.5 % rectal cream Apply rectally 2 times daily   latanoprost (XALATAN) 0.005 % ophthalmic solution Place 1 drop into the left eye at bedtime.   lisinopril (PRINIVIL,ZESTRIL) 2.5 MG tablet Take 1 tablet (2.5 mg total) by mouth daily.   methylphenidate (RITALIN) 10 MG tablet Take 1 tablet (10 mg total) by mouth 2 (two) times daily with breakfast and lunch.   pilocarpine (PILOCAR) 1 % ophthalmic solution Place 1 drop into the left eye 4 (four) times daily.   potassium chloride SA (K-DUR,KLOR-CON) 20 MEQ tablet Take 1 tablet (20 mEq total) by mouth daily.   No facility-administered encounter medications on file as of 04/29/2021.    HOSPICE ELIGIBILITY/DIAGNOSIS: TBD  PAST MEDICAL HISTORY:  Past Medical History:  Diagnosis Date   Hypertension    Stroke [facility])    "a long time ago -- 2 years ago"     ALLERGIES: No Known Allergies    I spent 50 minutes providing this consultation; this includes time spent with patient/family, chart review and documentation. More than 50% of the time in this consultation was spent on counseling and coordinating communication   Thank you for the opportunity to participate in the care of [facility] [place] Please call our office at [phone] if we can be of additional assistance.  Note: Portions of this note were generated with Scientist, clinical (histocompatibility and immunogenetics). Dictation errors may occur despite best attempts at  proofreading.  Rosaura Carpenter, NP

## 2021-09-26 ENCOUNTER — Non-Acute Institutional Stay: Payer: Medicare Other | Admitting: Hospice

## 2021-09-26 ENCOUNTER — Other Ambulatory Visit: Payer: Self-pay

## 2021-09-26 DIAGNOSIS — Z515 Encounter for palliative care: Secondary | ICD-10-CM

## 2021-09-26 DIAGNOSIS — F339 Major depressive disorder, recurrent, unspecified: Secondary | ICD-10-CM

## 2021-09-26 DIAGNOSIS — E43 Unspecified severe protein-calorie malnutrition: Secondary | ICD-10-CM

## 2021-09-26 NOTE — Progress Notes (Signed)
Therapist, nutritional Palliative Care Consult Note Telephone: [phone]  Fax: [phone]  PATIENT NAME: Jorge Burns DOB: 1941-04-24 MRN: [id number]  PRIMARY CARE PROVIDER:   Dr. Charlynne Pander   REFERRING PROVIDER:   Dr. Charlynne Pander   RESPONSIBLE PARTY:  [name]     Contact Information     Name Relation Home Work [place], [place] Granddaughter [phone]  [phone]   [name]   [phone]       Visit is to build trust and highlight Palliative Medicine as specialized medical care for people living with serious illness, aimed at facilitating better quality of life through symptoms relief, assisting with advance care planning and complex medical decision making. This is a follow up visit.  RECOMMENDATIONS/PLAN:   Advance Care Planning/Code Status: Patient is a Do Not Resuscitate  Goals of Care: Goals of care include to maximize quality of life and symptom management.  Visit consisted of counseling and education dealing with the complex and emotionally intense issues of symptom management and palliative care in the setting of serious and potentially life-threatening illness. Palliative care team will continue to support patient, patient's family, and medical team.  Symptom management/Plan:  Protein caloric malnutrition: Nursing reports appetite improved, eating up to 75% of his meals most times. Offer Ensure, offer small meals 4-6 times a day, provide assistance during meals to ensure adequate oral intake. Weight increased from 98 pounds 4 months ago to 104 pounds current.   Monthly weight. ST consult as needed. Dementia: Continue ongoing supportive care FAST 7a, total care Depression: Followed by Psych, last visit 08/22/2021. Per Psych, no psychotropic medications at this time. Patient non-compliant. Encourage interactions and participation in facility activities.   Follow up: Palliative care will continue to follow for complex  medical decision making, advance care planning, and clarification of goals. Return 6 weeks or prn. Encouraged to call provider sooner with any concerns.  CHIEF COMPLAINT: Palliative follow up  HISTORY OF PRESENT ILLNESS:  Jorge Burns a 81 y.o. male with multiple medical problems including protein caloric malnutrition, hemiplegia and hemiparesis following cerebral infarction affecting right dominant side, CVA in 2019, depression, anxiety, Dysphagia, Vascular Dementia. Patient is a poor historian due to cognitive and language deficits, answers simple yes and no question. He denies pain/discomfort, did not want to be bothered. Review of facility psych note of visit 08/22/21 indicates uncertainty if communication deficits due to language barrier vs neurocognitive impairment. History obtained from review of EMR, discussion with nursing staff, family and/or patient. Records reviewed and summarized above. All 10 point systems reviewed and are negative except as documented in history of present illness above  Review and summarization of Epic records shows history from other than patient.   Palliative Care was asked to follow this patient o help address complex decision making in the context of advance care planning and goals of care clarification.   PHYSICAL EXAM  Height/weight: 4 feet 10 inches  Weight: 104 Ibs an increase from 98 Ibs April 2022 Constitutional: In no acute distress Cardiovascular: regular rate and rhythm, no edema in BLE Pulmonary: no cough, no increased work of breathing, normal respiratory effort Abdomen: soft, non tender, positive bowel sounds in all quadrants GU:  no suprapubic tenderness Eyes: Normal lids, no discharge, sclera anicteric ENMT: Moist mucous membranes Musculoskeletal: bed bound, non ambulatory, R side hemiplgia/hemparesis Skin: no rash to visible skin, dry skin,warm without cyanosis Psych: non anxious affect Neurological: Weakness but otherwise non  focal  Heme/lymph/immuno: no bruises, no bleeding  PERTINENT MEDICATIONS:  Outpatient Encounter Medications as of 09/26/2021  Medication Sig   aspirin 81 MG chewable tablet Chew 1 tablet (81 mg total) by mouth daily.   atorvastatin (LIPITOR) 80 MG tablet Take 1 tablet (80 mg total) by mouth daily at 6 PM.   brimonidine (ALPHAGAN) 0.2 % ophthalmic solution Place 1 drop into the left eye 2 (two) times daily.   citalopram (CELEXA) 10 MG tablet Take 1 tablet (10 mg total) by mouth daily.   clopidogrel (PLAVIX) 75 MG tablet Take 1 tablet (75 mg total) by mouth daily.   dorzolamide (TRUSOPT) 2 % ophthalmic solution Place 1 drop into the left eye 3 (three) times daily.   dorzolamidel-timolol (COSOPT) 22.3-6.8 MG/ML SOLN ophthalmic solution Place 1 drop into the left eye 2 (two) times daily.   HYDROcodone-acetaminophen (NORCO/VICODIN) 5-325 MG tablet Take 1 tablet by mouth every 6 (six) hours as needed.   hydrocortisone (ANUSOL-HC) 2.5 % rectal cream Apply rectally 2 times daily   latanoprost (XALATAN) 0.005 % ophthalmic solution Place 1 drop into the left eye at bedtime.   lisinopril (PRINIVIL,ZESTRIL) 2.5 MG tablet Take 1 tablet (2.5 mg total) by mouth daily.   methylphenidate (RITALIN) 10 MG tablet Take 1 tablet (10 mg total) by mouth 2 (two) times daily with breakfast and lunch.   pilocarpine (PILOCAR) 1 % ophthalmic solution Place 1 drop into the left eye 4 (four) times daily.   potassium chloride SA (K-DUR,KLOR-CON) 20 MEQ tablet Take 1 tablet (20 mEq total) by mouth daily.   No facility-administered encounter medications on file as of 09/26/2021.    HOSPICE ELIGIBILITY/DIAGNOSIS: TBD  PAST MEDICAL HISTORY:  Past Medical History:  Diagnosis Date   Hypertension    Stroke [facility])    "a long time ago -- 2 years ago"     ALLERGIES: No Known Allergies    I spent 45 minutes providing this consultation; this includes time spent with patient/family, chart review and documentation. More than 50% of  the time in this consultation was spent on counseling and coordinating communication   Thank you for the opportunity to participate in the care of [facility] [place] Please call our office at [phone] if we can be of additional assistance.  Note: Portions of this note were generated with Scientist, clinical (histocompatibility and immunogenetics). Dictation errors may occur despite best attempts at proofreading.  Rosaura Carpenter, NP

## 2021-10-22 ENCOUNTER — Non-Acute Institutional Stay: Payer: Medicare Other | Admitting: Hospice

## 2021-10-22 ENCOUNTER — Other Ambulatory Visit: Payer: Self-pay

## 2021-10-22 DIAGNOSIS — E43 Unspecified severe protein-calorie malnutrition: Secondary | ICD-10-CM

## 2021-10-22 DIAGNOSIS — F0153 Vascular dementia, unspecified severity, with mood disturbance: Secondary | ICD-10-CM

## 2021-10-22 DIAGNOSIS — F339 Major depressive disorder, recurrent, unspecified: Secondary | ICD-10-CM

## 2021-10-22 DIAGNOSIS — Z515 Encounter for palliative care: Secondary | ICD-10-CM

## 2021-10-22 NOTE — Progress Notes (Signed)
[facility] Consult Note Telephone: [phone]  Fax: [phone]  PATIENT NAME: Jorge Burns DOB: May 11, 1941 MRN: [id number]  PRIMARY CARE PROVIDER:   Dr. Caprice Renshaw   REFERRING PROVIDER:   Dr. Caprice Renshaw   RESPONSIBLE PARTY:  [name]     Contact Information     Name Relation Home Work [place], [place] Granddaughter [phone]  [phone]   [name]   [id number]       Visit is to build trust and highlight Palliative Medicine as specialized medical care for people living with serious illness, aimed at facilitating better quality of life through symptoms relief, assisting with advance care planning and complex medical decision making. This is a follow up visit.  RECOMMENDATIONS/PLAN:   Advance Care Planning/Code Status: Patient is a Do Not Resuscitate  Goals of Care: Goals of care include to maximize quality of life and symptom management.  Visit consisted of counseling and education dealing with the complex and emotionally intense issues of symptom management and palliative care in the setting of serious and potentially life-threatening illness. Palliative care team will continue to support patient, patient's family, and medical team.  Symptom management/Plan:  Protein caloric malnutrition: Improving appetite reported.  Continue to offer Ensure, offer small meals 4-6 times a day, provide assistance during meals to ensure adequate oral intake. Weight increased from 98 pounds 4 months ago to 104 pounds current.  Recheck weight today and continue with monthly weight. ST consult as needed. Dementia: Continue ongoing supportive care FAST 7a, total care.  Encourage reminiscence, word search/puzzles, cueing for recollection.  Promote calm approach and engaging environment.Use gentle persuasion, redirection and re-approach.  Depression: Followed by Psych, last visit 08/22/2021. Per Psych, no psychotropic  medications at this time because patient is noncompliant.  Psych consult as needed. Encourage interactions and participation in facility activities.   Follow up: Palliative care will continue to follow for complex medical decision making, advance care planning, and clarification of goals. Return 6 weeks or prn. Encouraged to call provider sooner with any concerns.  CHIEF COMPLAINT: Palliative follow up  HISTORY OF PRESENT ILLNESS:  Jorge Burns a 81 y.o. male with multiple medical problems including protein caloric malnutrition, hemiplegia and hemiparesis following cerebral infarction affecting right dominant side, CVA in 2019, depression, anxiety, Dysphagia, Vascular Dementia. Patient is a poor historian due to cognitive and language deficits, answers simple yes and no question. He denies pain/discomfort, did not want to be touched. Nursing reports patient is sometimes uncooperative. Review of facility psych note of visit [id number] indicates uncertainty if communication deficits due to language barrier vs neurocognitive impairment. History obtained from review of EMR, discussion with nursing staff, family and/or patient. Records reviewed and summarized above. All 10 point systems reviewed and are negative except as documented in history of present illness above  Review and summarization of Epic records shows history from other than patient.   Palliative Care was asked to follow this patient o help address complex decision making in the context of advance care planning and goals of care clarification.   PHYSICAL EXAM  Height/weight: 4 feet 10 inches  Weight: 104 Ibs an increase from 98 Ibs April 2022 Constitutional: In no acute distress Cardiovascular: regular rate and rhythm, no edema in BLE Pulmonary: no cough, no increased work of breathing, normal respiratory effort Eyes: Normal lids, no discharge, sclera anicteric Musculoskeletal: bed bound, non ambulatory, R side hemiplgia/hemparesis Skin: no rash  to visible skin, dry  skin,warm without cyanosis, mycotic toenails Psych: non anxious affect Neurological: Weakness but otherwise non focal  PERTINENT MEDICATIONS:  Outpatient Encounter Medications as of 10/22/2021  Medication Sig   aspirin 81 MG chewable tablet Chew 1 tablet (81 mg total) by mouth daily.   atorvastatin (LIPITOR) 80 MG tablet Take 1 tablet (80 mg total) by mouth daily at 6 PM.   brimonidine (ALPHAGAN) 0.2 % ophthalmic solution Place 1 drop into the left eye 2 (two) times daily.   citalopram (CELEXA) 10 MG tablet Take 1 tablet (10 mg total) by mouth daily.   clopidogrel (PLAVIX) 75 MG tablet Take 1 tablet (75 mg total) by mouth daily.   dorzolamide (TRUSOPT) 2 % ophthalmic solution Place 1 drop into the left eye 3 (three) times daily.   dorzolamidel-timolol (COSOPT) 22.3-6.8 MG/ML SOLN ophthalmic solution Place 1 drop into the left eye 2 (two) times daily.   HYDROcodone-acetaminophen (NORCO/VICODIN) 5-325 MG tablet Take 1 tablet by mouth every 6 (six) hours as needed.   hydrocortisone (ANUSOL-HC) 2.5 % rectal cream Apply rectally 2 times daily   latanoprost (XALATAN) 0.005 % ophthalmic solution Place 1 drop into the left eye at bedtime.   lisinopril (PRINIVIL,ZESTRIL) 2.5 MG tablet Take 1 tablet (2.5 mg total) by mouth daily.   methylphenidate (RITALIN) 10 MG tablet Take 1 tablet (10 mg total) by mouth 2 (two) times daily with breakfast and lunch.   pilocarpine (PILOCAR) 1 % ophthalmic solution Place 1 drop into the left eye 4 (four) times daily.   potassium chloride SA (K-DUR,KLOR-CON) 20 MEQ tablet Take 1 tablet (20 mEq total) by mouth daily.   No facility-administered encounter medications on file as of 10/22/2021.    HOSPICE ELIGIBILITY/DIAGNOSIS: TBD  PAST MEDICAL HISTORY:  Past Medical History:  Diagnosis Date   Hypertension    Stroke [facility])    "a long time ago -- 2 years ago"     ALLERGIES: No Known Allergies    I spent 45 minutes providing this consultation;  this includes time spent with patient/family, chart review and documentation. More than 50% of the time in this consultation was spent on counseling and coordinating communication   Thank you for the opportunity to participate in the care of [place] Please call our office at [phone] if we can be of additional assistance.  Note: Portions of this note were generated with Lobbyist. Dictation errors may occur despite best attempts at proofreading.  Teodoro Spray, NP

## 2021-12-23 ENCOUNTER — Non-Acute Institutional Stay: Payer: Medicare Other | Admitting: Hospice

## 2021-12-23 DIAGNOSIS — F339 Major depressive disorder, recurrent, unspecified: Secondary | ICD-10-CM

## 2021-12-23 DIAGNOSIS — F0153 Vascular dementia, unspecified severity, with mood disturbance: Secondary | ICD-10-CM

## 2021-12-23 DIAGNOSIS — Z515 Encounter for palliative care: Secondary | ICD-10-CM

## 2021-12-23 DIAGNOSIS — E43 Unspecified severe protein-calorie malnutrition: Secondary | ICD-10-CM

## 2021-12-23 NOTE — Progress Notes (Signed)
? ? ?[employer] [facility] [employer] ?[facility] Pall[facility] Consult Note ?Telephone: [phone]  ?Fax: [phone] ? ?PATIENT NAME: Jorge Burns ?DOB: 07/13/1941 ?MRN: [id number] ? ?PRIMARY CARE PROVIDER:   Dr. Charlynne Pander ?  ?REFERRING PROVIDER:   ?Dr. Charlynne Pander ?  ?RESPONSIBLE PARTY:  Jorge ?Contact Information   ?  ?Contact Information   ? ? Name Relation Home Work [place]  ? Burns, Jorge Granddaughter [phone]  [phone]  ? Jorge Burns   [phone]  ? ?  ? ? ?Visit is to build trust and highlight Palliative Medicine as specialized medical care for people living with serious illness, aimed at facilitating better quality of life through symptoms relief, assisting with advance care planning and complex medical decision making. This is a follow up visit. ? ?RECOMMENDATIONS/PLAN:  ? ?Advance Care Planning/Code Status: Patient is a Do Not Resuscitate ? ?Goals of Care: Goals of care include to maximize quality of life and symptom management. ? ? Palliative care team will continue to support patient, patient's family, and medical team. ? ?Symptom management/Plan:  ?Protein caloric malnutrition: Continue to offer Ensure, offer small meals 4-6 times a day, provide assistance during meals to ensure adequate oral intake.  Ensure twice daily.  Current weight 98 pounds.  Routine CBC CMP.  Monitor weight per facility protocol. ?Dementia: Optimize ongoing supportive care. FAST 7a, total care.  Encourage socialization, promote calm approach and engaging environment.Use gentle persuasion, redirection and re-approach when patient refuses care.  ?Depression: Followed by Psych, last visit 08/22/2021. Per Psych, no psychotropic medications at this time because patient is noncompliant.  Psych consult as needed. Encourage interactions and participation in facility activities.  Psych consult as needed. ?Follow up: Palliative care will continue to follow for complex medical decision making, advance care planning, and  clarification of goals. Return 6 weeks or prn. Encouraged to call provider sooner with any concerns. ? ?CHIEF COMPLAINT: Palliative follow up ? ?HISTORY OF PRESENT ILLNESS:  Jorge Burns a 81 y.o. male with multiple medical problems including protein caloric malnutrition, advanced vascular dementia, hemiplegia and hemiparesis following cerebral infarction affecting right dominant side, CVA in 2019, depression, anxiety, Dysphagia. Patient is a poor historian due to cognitive and language deficits, answers simple yes and no question. He denies pain/discomfort, cooperative today.  Nursing reports patient is sometimes uncooperative. Review of facility psych note of visit 08/22/21 indicates uncertainty if communication deficits due to language barrier vs neurocognitive impairment. History obtained from review of EMR, discussion with nursing staff, family and/or patient. Records reviewed and summarized above. All 10 point systems reviewed and are negative except as documented in history of present illness above ? ?Review and summarization of Epic records shows history from other than patient.  ? ?Palliative Care was asked to follow this patient o help address complex decision making in the context of advance care planning and goals of care clarification.  ? ?PHYSICAL EXAM  ?Height/weight: 4 feet 10 inches  Weight: 98 Ibs  ?Constitutional: In no acute distress ?Cardiovascular: regular rate and rhythm, no edema in BLE ?Pulmonary: no cough, no increased work of breathing, normal respiratory effort ?Eyes: Normal lids, no discharge, sclera anicteric ?Musculoskeletal: bed bound, non ambulatory, R side hemiplgia/hemparesis, severe sarcopenia ?Skin: no rash to visible skin, dry skin,warm without cyanosis, mycotic toenails ?Psych: non anxious affect ?Neurological: Weakness but otherwise non focal ? ?PERTINENT MEDICATIONS:  ?Outpatient Encounter Medications as of 12/23/2021  ?Medication Sig  ? aspirin 81 MG chewable tablet Chew 1 tablet  (81 mg total) by mouth  daily.  ? atorvastatin (LIPITOR) 80 MG tablet Take 1 tablet (80 mg total) by mouth daily at 6 PM.  ? brimonidine (ALPHAGAN) 0.2 % ophthalmic solution Place 1 drop into the left eye 2 (two) times daily.  ? citalopram (CELEXA) 10 MG tablet Take 1 tablet (10 mg total) by mouth daily.  ? clopidogrel (PLAVIX) 75 MG tablet Take 1 tablet (75 mg total) by mouth daily.  ? dorzolamide (TRUSOPT) 2 % ophthalmic solution Place 1 drop into the left eye 3 (three) times daily.  ? dorzolamidel-timolol (COSOPT) 22.3-6.8 MG/ML SOLN ophthalmic solution Place 1 drop into the left eye 2 (two) times daily.  ? HYDROcodone-acetaminophen (NORCO/VICODIN) 5-325 MG tablet Take 1 tablet by mouth every 6 (six) hours as needed.  ? hydrocortisone (ANUSOL-HC) 2.5 % rectal cream Apply rectally 2 times daily  ? latanoprost (XALATAN) 0.005 % ophthalmic solution Place 1 drop into the left eye at bedtime.  ? lisinopril (PRINIVIL,ZESTRIL) 2.5 MG tablet Take 1 tablet (2.5 mg total) by mouth daily.  ? methylphenidate (RITALIN) 10 MG tablet Take 1 tablet (10 mg total) by mouth 2 (two) times daily with breakfast and lunch.  ? pilocarpine (PILOCAR) 1 % ophthalmic solution Place 1 drop into the left eye 4 (four) times daily.  ? potassium chloride SA (K-DUR,KLOR-CON) 20 MEQ tablet Take 1 tablet (20 mEq total) by mouth daily.  ? ?No facility-administered encounter medications on file as of 12/23/2021.  ? ? ?HOSPICE ELIGIBILITY/DIAGNOSIS: TBD ? ?PAST MEDICAL HISTORY:  ?Past Medical History:  ?Diagnosis Date  ? Hypertension   ? Stroke [facility])   ? "a long time ago -- 2 years ago"  ?  ? ?ALLERGIES: No Known Allergies   ? ?I spent 45 minutes providing this consultation; this includes time spent with patient/family, chart review and documentation. More than 50% of the time in this consultation was spent on counseling and coordinating communication  ? ?Thank you for the opportunity to participate in the care of Jorge Burns Please call our office at  [phone] if we can be of additional assistance. ? ?Note: Portions of this note were generated with Scientist, clinical (histocompatibility and immunogenetics). Dictation errors may occur despite best attempts at proofreading. ? ?Rosaura Carpenter, NP ? ?  ?

## 2022-03-17 ENCOUNTER — Non-Acute Institutional Stay: Payer: Medicare Other | Admitting: Hospice

## 2022-03-17 DIAGNOSIS — F339 Major depressive disorder, recurrent, unspecified: Secondary | ICD-10-CM

## 2022-03-17 DIAGNOSIS — F0153 Vascular dementia, unspecified severity, with mood disturbance: Secondary | ICD-10-CM

## 2022-03-17 DIAGNOSIS — E43 Unspecified severe protein-calorie malnutrition: Secondary | ICD-10-CM

## 2022-03-17 DIAGNOSIS — Z515 Encounter for palliative care: Secondary | ICD-10-CM

## 2022-03-17 DIAGNOSIS — I10 Essential (primary) hypertension: Secondary | ICD-10-CM

## 2022-03-17 NOTE — Progress Notes (Signed)
Therapist, nutritional Palliative Care Consult Note Telephone: [phone]  Fax: [phone]  PATIENT NAME: Jorge Burns DOB: Apr 06, 1941 MRN: [id number]  PRIMARY CARE PROVIDER:   Dr. Charlynne Pander   REFERRING PROVIDER:   Dr. Charlynne Pander   RESPONSIBLE PARTY:  [name]     Contact Information     Name Relation Home Work [place], [place] Granddaughter [phone]  [phone]   [name]   [phone]       Visit is to build trust and highlight Palliative Medicine as specialized medical care for people living with serious illness, aimed at facilitating better quality of life through symptoms relief, assisting with advance care planning and complex medical decision making. This is a follow up visit.  RECOMMENDATIONS/PLAN:   Advance Care Planning/Code Status: Patient is a Do Not Resuscitate  Goals of Care: Goals of care include to maximize quality of life and symptom management.   Palliative care team will continue to support patient, patient's family, and medical team.  Symptom management/Plan:  Protein caloric malnutrition: Current weight 94.5 pounds down from 98 pounds, Height 5 feet BMI 18.55.  Routine CBC CMP.  Monitor weight per facility protocol. Take Ensure twice daily; offer small meals 4-6 times a day, provide assistance during meals to ensure adequate oral intake.   Hypertension: Continue clonidine patch.  Monitor BP with routine vital signs. Dementia: Worsening memory loss/confusion. Currently FAST 7b down from FAST 7a last visit, total care.  Encourage socialization, promote calm approach and engaging environment.Use gentle persuasion, redirection and re-approach when patient refuses care.  Depression: Followed by Psych. Continue Mirtazapine as ordered. Last Psych visit 03/03/22. Encourage interactions and participation in facility activities.  Psych consult as scheduled.  Follow up: Palliative care will continue to follow for  complex medical decision making, advance care planning, and clarification of goals. Return 6 weeks or prn. Encouraged to call provider sooner with any concerns.  CHIEF COMPLAINT: Palliative follow up  HISTORY OF PRESENT ILLNESS:  Jorge Burns a 81 y.o. male with multiple medical problems including protein caloric malnutrition, advanced vascular dementia, hemiplegia and hemiparesis following cerebral infarction affecting right dominant side, CVA in 2019, depression, anxiety, Dysphagia. Patient is a poor historian due to cognitive and language deficits, answers simple yes and no question. He denies pain/discomfort, cooperative today.  Nursing reports patient is sometimes uncooperative. Review of facility psych note of visit 08/22/21 indicates uncertainty if communication deficits due to language barrier vs neurocognitive impairment. History obtained from review of EMR, discussion with nursing staff, family and/or patient. Records reviewed and summarized above. All 10 point systems reviewed and are negative except as documented in history of present illness above  Review and summarization of Epic records shows history from other than patient.   Palliative Care was asked to follow this patient o help address complex decision making in the context of advance care planning and goals of care clarification.    PERTINENT MEDICATIONS:  Outpatient Encounter Medications as of 03/17/2022  Medication Sig   aspirin 81 MG chewable tablet Chew 1 tablet (81 mg total) by mouth daily.   atorvastatin (LIPITOR) 80 MG tablet Take 1 tablet (80 mg total) by mouth daily at 6 PM.   brimonidine (ALPHAGAN) 0.2 % ophthalmic solution Place 1 drop into the left eye 2 (two) times daily.   citalopram (CELEXA) 10 MG tablet Take 1 tablet (10 mg total) by mouth daily.   clopidogrel (PLAVIX) 75 MG tablet Take 1 tablet (75  mg total) by mouth daily.   dorzolamide (TRUSOPT) 2 % ophthalmic solution Place 1 drop into the left eye 3 (three)  times daily.   dorzolamidel-timolol (COSOPT) 22.3-6.8 MG/ML SOLN ophthalmic solution Place 1 drop into the left eye 2 (two) times daily.   HYDROcodone-acetaminophen (NORCO/VICODIN) 5-325 MG tablet Take 1 tablet by mouth every 6 (six) hours as needed.   hydrocortisone (ANUSOL-HC) 2.5 % rectal cream Apply rectally 2 times daily   latanoprost (XALATAN) 0.005 % ophthalmic solution Place 1 drop into the left eye at bedtime.   lisinopril (PRINIVIL,ZESTRIL) 2.5 MG tablet Take 1 tablet (2.5 mg total) by mouth daily.   methylphenidate (RITALIN) 10 MG tablet Take 1 tablet (10 mg total) by mouth 2 (two) times daily with breakfast and lunch.   pilocarpine (PILOCAR) 1 % ophthalmic solution Place 1 drop into the left eye 4 (four) times daily.   potassium chloride SA (K-DUR,KLOR-CON) 20 MEQ tablet Take 1 tablet (20 mEq total) by mouth daily.   No facility-administered encounter medications on file as of 03/17/2022.    HOSPICE ELIGIBILITY/DIAGNOSIS: TBD  PAST MEDICAL HISTORY:  Past Medical History:  Diagnosis Date   Hypertension    Stroke [facility])    "a long time ago -- 2 years ago"     ALLERGIES: No Known Allergies    I spent 45 minutes providing this consultation; this includes time spent with patient/family, chart review and documentation. More than 50% of the time in this consultation was spent on counseling and coordinating communication   Thank you for the opportunity to participate in the care of [facility] [place] Please call our office at [phone] if we can be of additional assistance.  Note: Portions of this note were generated with Scientist, clinical (histocompatibility and immunogenetics). Dictation errors may occur despite best attempts at proofreading.  Rosaura Carpenter, NP

## 2022-08-26 ENCOUNTER — Non-Acute Institutional Stay: Payer: Medicare Other | Admitting: Hospice

## 2022-08-26 DIAGNOSIS — E43 Unspecified severe protein-calorie malnutrition: Secondary | ICD-10-CM

## 2022-08-26 DIAGNOSIS — F0153 Vascular dementia, unspecified severity, with mood disturbance: Secondary | ICD-10-CM

## 2022-08-26 DIAGNOSIS — Z515 Encounter for palliative care: Secondary | ICD-10-CM

## 2022-08-26 DIAGNOSIS — F339 Major depressive disorder, recurrent, unspecified: Secondary | ICD-10-CM

## 2022-08-26 DIAGNOSIS — I10 Essential (primary) hypertension: Secondary | ICD-10-CM

## 2022-08-26 NOTE — Progress Notes (Signed)
Therapist, nutritional Palliative Care Consult Note Telephone: [phone]  Fax: [phone]  PATIENT NAME: Jorge Burns DOB: 1941/05/21 MRN: [id number]  PRIMARY CARE PROVIDER:   Dr. Charlynne Pander   REFERRING PROVIDER:   Dr. Charlynne Pander   RESPONSIBLE PARTY:  [name]     Contact Information     Name Relation Home Work [place], [place] Granddaughter [phone]  [phone]   Jorge Burns   [phone]       Visit is to build trust and highlight Palliative Medicine as specialized medical care for people living with serious illness, aimed at facilitating better quality of life through symptoms relief, assisting with advance care planning and complex medical decision making. This is a follow up visit.  RECOMMENDATIONS/PLAN:   Advance Care Planning/Code Status: Patient is a Do Not Resuscitate  Goals of Care: Goals of care include to maximize quality of life and symptom management.   Palliative care team will continue to support patient, patient's family, and medical team.  Symptom management/Plan:  Protein caloric malnutrition: Current weight is 89.6 Nov '23 down from from 98 pounds three months ago. Height 5 feet.  Routine CBC CMP.  Nursing reports he often refuses his medications; sometimes refusing to eat. Take Ensure twice daily; Boost daily, offer small meals 4-6 times a day, provide assistance during meals to ensure adequate oral intake. Monitor weight per facility protocol.   Hypertension: Continue clonidine patch.  Monitor BP with routine vital signs. Dementia: Worsening memory loss/confusion. Currently FAST 7b down from FAST 7a last visit, total care.  Encourage socialization, promote calm approach and engaging environment.Use gentle persuasion, redirection and re-approach when patient refuses care.  Depression: Followed by Psych. Continue Mirtazapine as ordered. Encourage interactions and participation in facility activities.   Psych consult as scheduled.  Follow up: Palliative care will continue to follow for complex medical decision making, advance care planning, and clarification of goals. Return 6 weeks or prn. Encouraged to call provider sooner with any concerns.  CHIEF COMPLAINT: Palliative follow up  HISTORY OF PRESENT ILLNESS:  Jorge Burns a 81 y.o. male with multiple medical problems including protein caloric malnutrition, advanced vascular dementia, hemiplegia and hemiparesis following cerebral infarction affecting right dominant side, CVA in 2019, depression, anxiety, Dysphagia. History obtained from review of EMR, discussion with nursing staff, family and/or patient. Records reviewed and summarized above. All 10 point systems reviewed and are negative except as documented in history of present illness above  Review and summarization of Epic records shows history from other than patient.   Palliative Care was asked to follow this patient o help address complex decision making in Jorge context of advance care planning and goals of care clarification.    PERTINENT MEDICATIONS:  Outpatient Encounter Medications as of 08/26/2022  Medication Sig   aspirin 81 MG chewable tablet Chew 1 tablet (81 mg total) by mouth daily.   atorvastatin (LIPITOR) 80 MG tablet Take 1 tablet (80 mg total) by mouth daily at 6 PM.   brimonidine (ALPHAGAN) 0.2 % ophthalmic solution Place 1 drop into Jorge left eye 2 (two) times daily.   citalopram (CELEXA) 10 MG tablet Take 1 tablet (10 mg total) by mouth daily.   clopidogrel (PLAVIX) 75 MG tablet Take 1 tablet (75 mg total) by mouth daily.   dorzolamide (TRUSOPT) 2 % ophthalmic solution Place 1 drop into Jorge left eye 3 (three) times daily.   dorzolamidel-timolol (COSOPT) 22.3-6.8 MG/ML SOLN ophthalmic solution Place 1 drop  into Jorge left eye 2 (two) times daily.   HYDROcodone-acetaminophen (NORCO/VICODIN) 5-325 MG tablet Take 1 tablet by mouth every 6 (six) hours as needed.   hydrocortisone  (ANUSOL-HC) 2.5 % rectal cream Apply rectally 2 times daily   latanoprost (XALATAN) 0.005 % ophthalmic solution Place 1 drop into Jorge left eye at bedtime.   lisinopril (PRINIVIL,ZESTRIL) 2.5 MG tablet Take 1 tablet (2.5 mg total) by mouth daily.   methylphenidate (RITALIN) 10 MG tablet Take 1 tablet (10 mg total) by mouth 2 (two) times daily with breakfast and lunch.   pilocarpine (PILOCAR) 1 % ophthalmic solution Place 1 drop into Jorge left eye 4 (four) times daily.   potassium chloride SA (K-DUR,KLOR-CON) 20 MEQ tablet Take 1 tablet (20 mEq total) by mouth daily.   No facility-administered encounter medications on file as of 08/26/2022.    HOSPICE ELIGIBILITY/DIAGNOSIS: TBD  PAST MEDICAL HISTORY:  Past Medical History:  Diagnosis Date   Hypertension    Stroke [facility])    "a long time ago -- 2 years ago"     ALLERGIES: No Known Allergies    I spent 45 minutes providing this consultation; this includes time spent with patient/family, chart review and documentation. More than 50% of Jorge time in this consultation was spent on counseling and coordinating communication   Thank you for Jorge opportunity to participate in Jorge care of Jorge Burns Please call our office at [phone] if we can be of additional assistance.  Note: Portions of this note were generated with Scientist, clinical (histocompatibility and immunogenetics). Dictation errors may occur despite best attempts at proofreading.  Rosaura Carpenter, NP

## 2022-09-17 ENCOUNTER — Encounter (HOSPITAL_COMMUNITY): Payer: Self-pay | Admitting: *Deleted

## 2022-09-17 ENCOUNTER — Emergency Department (HOSPITAL_COMMUNITY): Payer: Medicare Other

## 2022-09-17 ENCOUNTER — Other Ambulatory Visit: Payer: Self-pay

## 2022-09-17 ENCOUNTER — Emergency Department (HOSPITAL_COMMUNITY)
Admission: EM | Admit: 2022-09-17 | Discharge: 2022-09-17 | Disposition: A | Discharge status: Home / Self Care | Payer: Medicare Other | Attending: Emergency Medicine | Admitting: Emergency Medicine

## 2022-09-17 DIAGNOSIS — S0083XA Contusion of other part of head, initial encounter: Secondary | ICD-10-CM | POA: Diagnosis not present

## 2022-09-17 DIAGNOSIS — F0390 Unspecified dementia without behavioral disturbance: Secondary | ICD-10-CM | POA: Insufficient documentation

## 2022-09-17 DIAGNOSIS — S0181XA Laceration without foreign body of other part of head, initial encounter: Secondary | ICD-10-CM | POA: Diagnosis not present

## 2022-09-17 DIAGNOSIS — W19XXXA Unspecified fall, initial encounter: Secondary | ICD-10-CM | POA: Diagnosis not present

## 2022-09-17 DIAGNOSIS — S065XAA Traumatic subdural hemorrhage with loss of consciousness status unknown, initial encounter: Secondary | ICD-10-CM

## 2022-09-17 DIAGNOSIS — Z7982 Long term (current) use of aspirin: Secondary | ICD-10-CM | POA: Diagnosis not present

## 2022-09-17 DIAGNOSIS — S065X0A Traumatic subdural hemorrhage without loss of consciousness, initial encounter: Secondary | ICD-10-CM | POA: Insufficient documentation

## 2022-09-17 DIAGNOSIS — S066X0A Traumatic subarachnoid hemorrhage without loss of consciousness, initial encounter: Secondary | ICD-10-CM | POA: Insufficient documentation

## 2022-09-17 DIAGNOSIS — I609 Nontraumatic subarachnoid hemorrhage, unspecified: Secondary | ICD-10-CM

## 2022-09-17 DIAGNOSIS — Z7902 Long term (current) use of antithrombotics/antiplatelets: Secondary | ICD-10-CM | POA: Diagnosis not present

## 2022-09-17 DIAGNOSIS — S0993XA Unspecified injury of face, initial encounter: Secondary | ICD-10-CM | POA: Diagnosis present

## 2022-09-17 LAB — CBC
HCT: 45.6 % (ref 39.0–52.0)
Hemoglobin: 14.7 g/dL (ref 13.0–17.0)
MCH: 26.1 pg (ref 26.0–34.0)
MCHC: 32.2 g/dL (ref 30.0–36.0)
MCV: 80.9 fL (ref 80.0–100.0)
Platelets: 253 10*3/uL (ref 150–400)
RBC: 5.64 MIL/uL (ref 4.22–5.81)
RDW: 15.9 % — ABNORMAL HIGH (ref 11.5–15.5)
WBC: 10.3 10*3/uL (ref 4.0–10.5)
nRBC: 0 % (ref 0.0–0.2)

## 2022-09-17 LAB — I-STAT CHEM 8, ED
BUN: 17 mg/dL (ref 8–23)
Calcium, Ion: 1.15 mmol/L (ref 1.15–1.40)
Chloride: 110 mmol/L (ref 98–111)
Creatinine, Ser: 1.1 mg/dL (ref 0.61–1.24)
Glucose, Bld: 131 mg/dL — ABNORMAL HIGH (ref 70–99)
HCT: 46 % (ref 39.0–52.0)
Hemoglobin: 15.6 g/dL (ref 13.0–17.0)
Potassium: 4.5 mmol/L (ref 3.5–5.1)
Sodium: 144 mmol/L (ref 135–145)
TCO2: 22 mmol/L (ref 22–32)

## 2022-09-17 LAB — CK: Total CK: 71 U/L (ref 49–397)

## 2022-09-17 LAB — COMPREHENSIVE METABOLIC PANEL
ALT: 16 U/L (ref 0–44)
AST: 22 U/L (ref 15–41)
Albumin: 3.6 g/dL (ref 3.5–5.0)
Alkaline Phosphatase: 78 U/L (ref 38–126)
Anion gap: 12 (ref 5–15)
BUN: 15 mg/dL (ref 8–23)
CO2: 22 mmol/L (ref 22–32)
Calcium: 9.3 mg/dL (ref 8.9–10.3)
Chloride: 110 mmol/L (ref 98–111)
Creatinine, Ser: 1.23 mg/dL (ref 0.61–1.24)
GFR, Estimated: 60 mL/min — ABNORMAL LOW (ref >60)
Glucose, Bld: 135 mg/dL — ABNORMAL HIGH (ref 70–99)
Potassium: 4.6 mmol/L (ref 3.5–5.1)
Sodium: 144 mmol/L (ref 135–145)
Total Bilirubin: 0.8 mg/dL (ref 0.3–1.2)
Total Protein: 8.1 g/dL (ref 6.5–8.1)

## 2022-09-17 LAB — PROTIME-INR
INR: 1 (ref 0.8–1.2)
Prothrombin Time: 13.1 seconds (ref 11.4–15.2)

## 2022-09-17 MED ORDER — ZIPRASIDONE MESYLATE 20 MG IM SOLR
20.0000 mg | Freq: Once | INTRAMUSCULAR | Status: AC
  Administered 2022-09-17: 20 mg via INTRAMUSCULAR
  Filled 2022-09-17: qty 20

## 2022-09-17 MED ORDER — LORAZEPAM 2 MG/ML IJ SOLN
1.0000 mg | Freq: Once | INTRAMUSCULAR | Status: AC
  Administered 2022-09-17: 1 mg via INTRAMUSCULAR
  Filled 2022-09-17: qty 1

## 2022-09-17 NOTE — ED Notes (Signed)
The pt is normally non-verbal

## 2022-09-17 NOTE — ED Provider Notes (Signed)
Shared visit with PA.  I have provided medical decision making and obtained history and physical.  Patient comes from nursing home.  Severe dementia.  Unwitnessed fall.  Has a small laceration to the top of his left eyelid.  She had a large hematoma over the left side of his head.  He is on Plavix.  There is really no next of kin.  Numbers on file are of church members that take care of him.  He does not have any family here.  He is severely demented at baseline.  Overall he appears well despite having significant trauma over the left side of his forehead.  He is got a little bit of bleeding over the eyelid but he is easily agitated which seems to be his baseline.  CT scan of the head showed small head bleed.  Talked with Dr. Lovell Sheehan with neurosurgery who does not recommend any further workup.  Patient is DNR per chart review and sounds like when we talk with friend who is listed as next of kin that is consistent with wishes.  Dr. Lovell Sheehan would not pursue any surgical workup on this patient if he did have worsening compromise and ultimately decision will be to not further monitor this bleeding at this time.  Very small volume.  Seems to be moving all extremities.  He does not have any obvious facial fractures, no neck injury.  Blood works unremarkable.  There was a question if maybe there was some blood within the left globe of his eye.  Very difficult to do an eye exam as it is very swollen and he does not participate well.  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  If there was a small hemorrhage within the vitreous humor would not be a surgical candidate or any surgery process.  Ultimately will allow this to heal on its own.  Ultimately after talking with specialties there would not be any acute or emergent management of his injuries.  We will touch base with friend and make them aware of everything and make sure they  are okay with this plan but the plan will be to send him back to his skilled nursing facility where he can continue to be taken care of.  Seems like he is a DNR.  Vital signs are normal.  I think it is reasonable to discharge him as labs are okay.  He seems to be acting at his baseline.  There is no acute indication for any surgery process or further evaluation, nor would he be a candidate for these processes if he did need them.  Patient to be discharged likely, see PA note.  This chart was dictated using voice recognition software.  Despite best efforts to proofread,  errors can occur which can change the documentation meaning.    Virgina Norfolk, DO 09/17/22 1020

## 2022-09-17 NOTE — ED Notes (Signed)
Trauma Response Nurse Documentation   Jorge Burns is a 81 y.o. male arriving to [facility] ED via EMS  On clopidogrel 75 mg daily. Trauma was activated as a Level 2 by ED charge RN based on the following trauma criteria Elderly patients > 65 with head trauma on anti-coagulation (excluding ASA). Trauma team at the bedside on patient arrival.   Patient cleared for CT by [facility]. Pt transported to CT with trauma response nurse present to monitor. RN remained with the patient throughout their absence from the department for clinical observation.   GCS 9, which appears to be patient's baseline.  History   Past Medical History:  Diagnosis Date   Hypertension    Stroke [facility])    "a long time ago -- 2 years ago"     Past Surgical History:  Procedure Laterality Date   IR ANGIO INTRA EXTRACRAN SEL COM CAROTID INNOMINATE BILAT MOD SED  02/15/2018   IR ANGIO VERTEBRAL SEL SUBCLAVIAN INNOMINATE UNI R MOD SED  02/15/2018   IR ANGIO VERTEBRAL SEL VERTEBRAL UNI L MOD SED  02/15/2018       Initial Focused Assessment (If applicable, or please see trauma documentation): Alert combative male presents from SNF after an unwitnessed fall with left brow hematoma Airway patent/unobstructed No obvious uncontrolled hemorrhage, bleeding from left brow controlled GCS 9, unable to access pupils d/t combative behavior  CT's Completed:   CT Head, CT Maxillofacial, and CT C-Spine   Interventions:  IV start and trauma lab draw Portable chest and pelvis XRAY CT head, cspine, maxface TDAP deferred - updated in 2019  Plan for disposition:  Pending workup  Consults completed:  none at the time of this note.  Event Summary: Unwitnessed fall from facility, combative at baseline. Only moving left arm, attempting to hit and bite staff. Nonverbal, follows no commands and this appears to be baseline per [facility]. Established IV and escorted to CT. POC pending workup.  Bedside handoff with ED RN Jorge Burns.    Jorge Burns  O Jorge Burns  Trauma Response RN  Please call TRN at [phone] for further assistance.

## 2022-09-17 NOTE — ED Notes (Signed)
Patient transported to X-ray 

## 2022-09-17 NOTE — ED Provider Notes (Signed)
[facility]   CSN: [id number] Arrival date & time: 09/17/22  [id number]     History  Chief Complaint  Patient presents with   Fall    Jorge Burns is a 81 y.o. male.  81  year old male brought in by EMS from SNF, found on floor, hematoma to left forehead, blood on face. Reported to be at baseline mental status. On Plavix, leveled trauma on arrival.        Home Medications Prior to Admission medications   Medication Sig Start Date End Date Taking? Authorizing Provider  aspirin 81 MG chewable tablet Chew 1 tablet (81 mg total) by mouth daily. 02/19/18   Leatha GildingGherghe, Costin M, MD  atorvastatin (LIPITOR) 80 MG tablet Take 1 tablet (80 mg total) by mouth daily at 6 PM. 04/12/18   Jones Baleshomas, Eunice L, NP  brimonidine (ALPHAGAN) 0.2 % ophthalmic solution Place 1 drop into the left eye 2 (two) times daily. 12/29/18   Fawze, Mina A, PA-C  citalopram (CELEXA) 10 MG tablet Take 1 tablet (10 mg total) by mouth daily. 03/11/18   Angiulli, Mcarthur Rossettianiel J, PA-C  clopidogrel (PLAVIX) 75 MG tablet Take 1 tablet (75 mg total) by mouth daily. 03/11/18   Angiulli, Mcarthur Rossettianiel J, PA-C  dorzolamide (TRUSOPT) 2 % ophthalmic solution Place 1 drop into the left eye 3 (three) times daily. 08/08/18   Eber HongMiller, Brian, MD  dorzolamidel-timolol (COSOPT) 22.3-6.8 MG/ML SOLN ophthalmic solution Place 1 drop into the left eye 2 (two) times daily. 12/29/18   Fawze, Mina A, PA-C  HYDROcodone-acetaminophen (NORCO/VICODIN) 5-325 MG tablet Take 1 tablet by mouth every 6 (six) hours as needed. 08/08/18   Eber HongMiller, Brian, MD  hydrocortisone (ANUSOL-HC) 2.5 % rectal cream Apply rectally 2 times daily 03/22/18   Wieters, Hallie C, PA-C  latanoprost (XALATAN) 0.005 % ophthalmic solution Place 1 drop into the left eye at bedtime. 08/08/18   Eber HongMiller, Brian, MD  lisinopril (PRINIVIL,ZESTRIL) 2.5 MG tablet Take 1 tablet (2.5 mg total) by mouth daily. 03/11/18   Angiulli, Mcarthur Rossettianiel J, PA-C  methylphenidate (RITALIN) 10  MG tablet Take 1 tablet (10 mg total) by mouth 2 (two) times daily with breakfast and lunch. 03/11/18   Angiulli, Mcarthur Rossettianiel J, PA-C  pilocarpine (PILOCAR) 1 % ophthalmic solution Place 1 drop into the left eye 4 (four) times daily. 12/29/18   Fawze, Mina A, PA-C  potassium chloride SA (K-DUR,KLOR-CON) 20 MEQ tablet Take 1 tablet (20 mEq total) by mouth daily. 04/08/18   Gilda CreasePollina, Christopher J, MD      Allergies    Patient has no known allergies.    Review of Systems   Review of Systems Level 5 caveat for dementia Physical Exam Updated Vital Signs BP 132/74   Pulse 99   Temp 98 F (36.7 C) (Axillary)   Resp 18   Ht 5\' 6"  (1.676 m)   Wt 59 kg   SpO2 100%   BMI 20.99 kg/m  Physical Exam Vitals and nursing note reviewed.  Constitutional:      General: He is not in acute distress.    Appearance: He is well-developed. He is not diaphoretic.     Comments: Combative  HENT:     Head: Normocephalic.      Comments: Large hematoma over left eye obscures exam of the left eye.  Small laceration to the left upper lid.    Nose: Nose normal.     Mouth/Throat:     Mouth: Mucous membranes are dry.  Cardiovascular:     Rate and Rhythm: Normal rate and regular rhythm.     Pulses: Normal pulses.     Heart sounds: Normal heart sounds.  Pulmonary:     Effort: Pulmonary effort is normal.     Breath sounds: Normal breath sounds.  Chest:     Chest wall: No tenderness.  Abdominal:     Palpations: Abdomen is soft.     Tenderness: There is no abdominal tenderness.  Musculoskeletal:        General: No swelling, tenderness, deformity or signs of injury.     Cervical back: No tenderness.     Right lower leg: No edema.     Left lower leg: No edema.     Comments: No obvious pain with active and passive range of motion of upper and lower extremities. No pain over pelvis.  Skin:    General: Skin is warm and dry.  Neurological:     Mental Status: He is alert. Mental status is at baseline.  Psychiatric:         Behavior: Behavior is agitated and combative.     ED Results / Procedures / Treatments   Labs (all labs ordered are listed, but only abnormal results are displayed) Labs Reviewed  COMPREHENSIVE METABOLIC PANEL - Abnormal; Notable for the following components:      Result Value   Glucose, Bld 135 (*)    GFR, Estimated 60 (*)    All other components within normal limits  CBC - Abnormal; Notable for the following components:   RDW 15.9 (*)    All other components within normal limits  I-STAT CHEM 8, ED - Abnormal; Notable for the following components:   Glucose, Bld 131 (*)    All other components within normal limits  PROTIME-INR  CK  URINALYSIS, ROUTINE W REFLEX MICROSCOPIC  LACTIC ACID, PLASMA  SAMPLE TO BLOOD BANK    EKG None  Radiology CT Head Wo Contrast  Addendum Date: 09/17/2022   ADDENDUM REPORT: 09/17/2022 07:45 ADDENDUM: Study discussed by telephone with PA Kiano Terrien on 09/17/2022 at 07:36 . Electronically Signed   By: Odessa Fleming M.D.   On: 09/17/2022 07:45   Result Date: 09/17/2022 CLINICAL DATA:  81 year old male status post fall, trauma. Combative. EXAM: CT HEAD WITHOUT CONTRAST TECHNIQUE: Contiguous axial images were obtained from the base of the skull through the vertex without intravenous contrast. RADIATION DOSE REDUCTION: This exam was performed according to the departmental dose-optimization program which includes automated exposure control, adjustment of the mA and/or kV according to patient size and/or use of iterative reconstruction technique. COMPARISON:  Head CT 08/08/2018.  Brain MRI 02/13/2018. FINDINGS: Brain: Substantially progressed, multifocal bilateral chronic brain encephalomalacia since 02-03-18. Pronounced left frontal lobe encephalomalacia now. Chronic posterior right temporal lobe encephalomalacia. Ex vacuo ventricular enlargement especially on the left. Small volume scattered bilateral acute subarachnoid hemorrhage superimposed on the  dominant areas of encephalomalacia in both hemispheres (series 7, images 19 and 21 clump) , Also within the right inferior frontal gyrus and cingulate sulcus (image 24). Trace para falcine subdural blood suspected on the right series 5, image 26. Underlying dural calcification of the falx otherwise appears stable. No other convincing subdural hematoma. But no associated IVH. No intracranial mass effect. Basilar cisterns remain normal. No midline shift. No acute cortically based infarct identified. Vascular: Extensive Calcified atherosclerosis at the skull base. Skull: No acute osseous abnormality identified. Sinuses/Orbits: New partial opacification of the right middle ear and mastoids since 02-03-2018. Other  Visualized paranasal sinuses and mastoids are stable and well aerated. Other: Left forehead, supraorbital scalp hematoma measures up to 12 mm in thickness. Underlying left frontal bone appears intact. Partially visible left globe appears diffusely hyperdense which is new since 2018-01-26 but might be postoperative. There is rightward gaze of the right globe. No orbital wall fracture is evident. Superimposed calcified scalp vessel atherosclerosis. IMPRESSION: 1. Positive for small volume but scattered bilateral Acute Subarachnoid Hemorrhage, and small volume parafalcine Subdural Hematoma. 2. Underlying extensive chronic encephalomalacia, especially the left frontal lobe. No intracranial mass effect. 3. Left forehead scalp hematoma. No acute skull or orbital fracture identified. But the left globe is diffusely hyperdense which is new since 2018-01-26. Correlate for interval ophthalmological surgery to that lobe. 4. New partial opacification of the right middle ear and mastoids since 01/26/18 appears inflammatory/postinflammatory. Electronically Signed: By: Odessa Fleming M.D. On: 09/17/2022 07:31   CT Cervical Spine Wo Contrast  Result Date: 09/17/2022 CLINICAL DATA:  81 year old male status post fall, trauma. Combative. On blood  thinners. EXAM: CT CERVICAL SPINE WITHOUT CONTRAST TECHNIQUE: Multidetector CT imaging of the cervical spine was performed without intravenous contrast. Multiplanar CT image reconstructions were also generated. RADIATION DOSE REDUCTION: This exam was performed according to the departmental dose-optimization program which includes automated exposure control, adjustment of the mA and/or kV according to patient size and/or use of iterative reconstruction technique. COMPARISON:  CT head and face today reported separately. CTA neck 02/13/2018. Chest CT 02/16/2018. FINDINGS: Alignment: Chronic straightening but increased reversal of cervical lordosis compared to the 2018/01/26 CTA. Stable cervicothoracic junction alignment. Bilateral posterior element alignment is within normal limits. Skull base and vertebrae: Visualized skull base is intact. No atlanto-occipital dissociation. C1 and C2 appear intact and aligned. No acute osseous abnormality identified. Soft tissues and spinal canal: No prevertebral fluid or swelling. No visible canal hematoma. Bulky calcified cervical carotid atherosclerosis. Chronic coarsely calcified left thyroid lobe is stable In the setting of significant comorbidities or limited life expectancy, no follow-up recommended (ref: J Am Coll Radiol. 2015 Feb;12(2): 143-50). Otherwise negative visible noncontrast neck soft tissues. Disc levels: Chronic cervical disc and endplate degeneration. Multilevel degenerative cervical spinal stenosis is probably stable since 2018/01/26, most pronounced at C4-C5 and C5-C6. Upper chest: Partially visible chronic scarring and architectural distortion in the left lung apex does not appear significantly changed. Less pronounced chronic right apical lung scarring. Visible upper thoracic levels appear grossly intact. IMPRESSION: 1. No acute traumatic injury identified in the cervical spine. 2. Chronic cervical spine degeneration with multilevel spinal stenosis, no significant  progression since 01/26/2018. 3. Left > right chronic apical lung scarring and distortion. Electronically Signed   By: Odessa Fleming M.D.   On: 09/17/2022 07:44   CT Maxillofacial Wo Contrast  Result Date: 09/17/2022 CLINICAL DATA:  81 year old male status post fall, trauma. Combative. EXAM: CT MAXILLOFACIAL WITHOUT CONTRAST TECHNIQUE: Multidetector CT imaging of the maxillofacial structures was performed. Multiplanar CT image reconstructions were also generated. RADIATION DOSE REDUCTION: This exam was performed according to the departmental dose-optimization program which includes automated exposure control, adjustment of the mA and/or kV according to patient size and/or use of iterative reconstruction technique. COMPARISON:  Head CT today reported separately. Prior head CT 08/08/2018. FINDINGS: Osseous: Carious posterior maxillary dentition worse on the left. Dental periapical lucency there. Mild motion artifact about the mouth. But the mandible appears intact with normal bilateral TMJ alignment. No acute maxilla fracture identified. Bilateral zygoma and pterygoid bones appear intact. No acute nasal bone fracture  identified. Central skull base appears intact. Cervical spine is detailed separately today. Orbits: Intact orbital walls. Large left periorbital, supraorbital scalp hematoma is up to 3 cm thick. Left globe appears small and hyperdense. Lens is not identified now. These findings are new since 2019. But the contralateral right orbits soft tissues appear negative aside from lateral gaze deviation. Sinuses: Minor maxillary sinus alveolar recess mucosal thickening. Other paranasal sinuses are well aerated. Subtotal new right middle ear and mastoid opacification with debris in the external auditory canal. Left middle ear and mastoids remain better pneumatized. Soft tissues: Left forehead, periorbital scalp hematoma as above. No scalp soft tissue gas identified. Bulky cervical carotid calcified atherosclerosis.  Otherwise negative visible noncontrast deep soft tissue spaces of the face. Limited intracranial: Positive for chronic encephalomalacia with superimposed acute intracranial hemorrhage, detailed separately today. IMPRESSION: 1. Relatively large left periorbital / supraorbital scalp hematoma. But no underlying acute orbit or Face fracture identified. 2. But small and hyperdense left globe is new since 2019. Differential considerations include vitreous hemorrhage, versus interval ophthalmological surgery. 3. Carious posterior maxillary dentition. 4. Head CT and cervical spine are reported separately. Study discussed by telephone with PA Hawthorne Day on 09/17/2022 at 07:36 . Electronically Signed   By: Odessa Fleming M.D.   On: 09/17/2022 07:41   DG Pelvis Portable  Result Date: 09/17/2022 CLINICAL DATA:  81 year old male status post fall, trauma. Combative. EXAM: PORTABLE PELVIS 1-2 VIEWS COMPARISON:  CT Abdomen and Pelvis 04/08/2018. FINDINGS: Portable AP supine view at 0640 hours. Femoral heads remain normally located. Pelvis appears stable and intact. SI joint and symphysis appears stable. Grossly intact proximal femurs. There are chronic retained metallic foreign bodies which are posterior to the proximal right femur. Negative visible bowel gas pattern. Probably external wire-like metal foreign body also projects at the medial left thigh. IMPRESSION: No acute fracture or dislocation identified about the pelvis. Electronically Signed   By: Odessa Fleming M.D.   On: 09/17/2022 07:33   DG Chest Port 1 View  Result Date: 09/17/2022 CLINICAL DATA:  81 year old male status post fall, trauma. Combative. EXAM: PORTABLE CHEST 1 VIEW COMPARISON:  Chest radiographs 04/07/2018, CT 02/16/2018 and earlier. FINDINGS: Portable AP supine view at 0640 hours. Chronically reduced left lung volume, with left apical architectural distortion tracking to the hilum. This appears not significantly changed from 2019, value aided with CT at that  time. Mildly lower lung volumes. Stable cardiac size and mediastinal contours. No pneumothorax, pulmonary edema, or acute pulmonary opacity identified. Coarsely calcified chronic left thyroid nodule at the thoracic inlet again noted. No acute osseous abnormality identified. Negative visible bowel gas. IMPRESSION: 1. No acute cardiopulmonary abnormality or acute traumatic injury identified. 2. Chronically left lung scarring and volume loss not significantly changed since 2019. Electronically Signed   By: Odessa Fleming M.D.   On: 09/17/2022 07:13    Procedures .Marland KitchenLaceration Repair  Date/Time: 09/17/2022 11:29 AM  Performed by: Jeannie Fend, PA-C Authorized by: Jeannie Fend, PA-C   Consent:    Consent obtained:  Emergent situation Universal protocol:    Patient identity confirmed:  Arm band Anesthesia:    Anesthesia method:  None Laceration details:    Location:  Face   Face location:  L upper eyelid   Extent:  Superficial   Length (cm):  0.5   Depth (mm):  2 Pre-procedure details:    Preparation:  Patient was prepped and draped in usual sterile fashion and imaging obtained to evaluate for foreign bodies Exploration:  Imaging outcome: foreign body not noted     Wound exploration: entire depth of wound visualized     Wound extent: no signs of injury and no underlying fracture     Contaminated: no   Treatment:    Area cleansed with:  Saline   Amount of cleaning:  Standard   Irrigation solution:  Sterile saline Skin repair:    Repair method:  Tissue adhesive Approximation:    Approximation:  Close Repair type:    Repair type:  Simple Post-procedure details:    Dressing:  Open (no dressing)   Procedure completion:  Tolerated well, no immediate complications .Critical Care  Performed by: Jeannie Fend, PA-C Authorized by: Jeannie Fend, PA-C   Critical care provider statement:    Critical care time (minutes):  30   Critical care was time spent personally by me on the  following activities:  Development of treatment plan with patient or surrogate, discussions with consultants, evaluation of patient's response to treatment, examination of patient, ordering and review of laboratory studies, ordering and review of radiographic studies, ordering and performing treatments and interventions, pulse oximetry, re-evaluation of patient's condition and review of old charts     Medications Ordered in ED Medications  ziprasidone (GEODON) injection 20 mg (20 mg Intramuscular Given 09/17/22 0815)  LORazepam (ATIVAN) injection 1 mg (1 mg Intramuscular Given 09/17/22 0815)    ED Course/ Medical Decision Making/ A&P                           Medical Decision Making Amount and/or Complexity of Data Reviewed Labs: ordered. Radiology: ordered.  Risk Prescription drug management.   This patient presents to the ED for concern of found on floor by nursing home staff, concern for fall on thinners, level trauma activated, this involves an extensive number of treatment options, and is a complaint that carries with it a high risk of complications and morbidity.  The differential diagnosis includes but not limited to intracranial injury, C-spine injury, pelvic fracture, rhabdomyolysis   Co morbidities that complicate the patient evaluation  Dementia, CVA, hypertension, glaucoma, cataract   Additional history obtained:  Additional history obtained from [name] Ezelle-identified in the demographics as patient's granddaughter, states that patient does not have any biological family in this country.  Patient lived with this nonrelative up until he had his stroke in 2019 when they could no longer care for him.  At that time, patient was placed in a facility. Ms Capaldi receives calls from the facility when there are questions or concerns.  She last saw him in August.  She notes that he has a history of problems with his left eye, is agitated with staff when they tried to apply drops to the  eye so the staff often does not apply the drops.  She notes that he does not see well from this eye and she has noticed the eye looking worse over time.  She notes patient is a DNR as their culture would not believe in taking these measures.  Regarding his healthcare decisions, their church community needs to make these decisions, he does not have an assigned power of attorney formally. External records from outside source obtained and reviewed including most recent visit with palliative care dated 08/26/2022, noted to be DO NOT RESUSCITATE.  Reviewed records and management plan.   Lab Tests:  I Ordered, and personally interpreted labs.  The pertinent results include: CBC without significant findings.  CMP without significant findings.  CK normal   Imaging Studies ordered:  I ordered imaging studies including CT head, maxillofacial, C-spine.  Portable chest x-ray, portable pelvis x-ray I independently visualized and interpreted imaging which showed x-ray image without acute finding.  CT head with chronic findings, acute findings as discussed with radiology.  CT C-spine negative for acute bony injury.  CT maxillofacial with significant left frontal hematoma, globe findings as discussed with radiology. I agree with the radiologist interpretation   Consultations Obtained:  Case discussed with radiology who is called to give critical results regarding head CT.  Questions if left eye findings are traumatic in nature versus chronic. I requested consultation with the neurosurgeon, Dr. Lovell Sheehan,  and discussed lab and imaging findings as well as pertinent plan - they recommend: No further imaging needed, not surgical candidate.  Neurosurgery will round on patient while admitted. Case discussed with ER attending, Dr. Derald Macleod, recommends consult to neurosurgery, ophthalmology, plan to admit to hospital service. Case discussed with Dr. Randon Goldsmith on-call with ophthalmology who has reviewed the imaging and records  for this patient.  Of note, found a visit to Dr. Alden Hipp with ophthalmology who noted a dense cataract in this eye and [facility]  This is felt to be and not seeing left eye.  Recommendation is to allow the hematoma to resolve and examine as able.   Problem List / ED Course / Critical interventions / Medication management  81 year old male with history of dementia, additional history in chart brought in by EMS after found on floor by nursing facility this morning, on Plavix.  Found to have large hematoma over the left eye, unable to evaluate the eye secondary to swelling as well as patient's combative nature.  Patient is provided with medications to allow for assessment and management of the small laceration over the left eyelid including closure with Dermabond, tolerated procedure well.  He is found to have subarachnoid hemorrhage and subdural hematoma on CT head, findings discussed with Dr. Lovell Sheehan with neurosurgery, not a surgical candidate, surgery not indicated for this patient and may return to nursing facility.  There is possibility of needing admission for further management regarding patient's eye injury, if patient were to stay in the hospital, neurosurgery is available to round on the patient for consult as needed.  Regarding the left eye injury, questionable whether this is acutely trauma related versus chronic in nature.  Case was discussed with ophthalmology on-call who was able to review records to find chronic problems involving his eye which is thought to be nonseeing.  No further treatment is needed for the eye, recommends for hematoma to resolve, allow for exam as needed.  Call to patient's friend on file as he has no biologic family in this country.  She will plan to see the patient in the ER today or back at his facility. I have reviewed the patients home medicines and have made adjustments as needed   Social Determinants of Health:  Lives at skilled nursing care  facility   Test / Admission - Considered:  Consider admission for monitoring however patient's injuries today do not require surgical intervention after consultation with ophthalmology and neurosurgery.  Patient is felt stable for discharge back to facility for ongoing care and recheck with his palliative care team.         Final Clinical Impression(s) / ED Diagnoses Final diagnoses:  Fall, initial encounter  Subarachnoid bleed (HCC)  Subdural hematoma (HCC)  Face lacerations, initial encounter  Facial hematoma, initial encounter    Rx / DC Orders ED Discharge Orders     None         Jeannie Fend, PA-C 09/17/22 1239    Virgina Norfolk, DO 09/17/22 1319

## 2022-09-17 NOTE — ED Notes (Signed)
Patient is non-verbal at this time which is baseline. Patient is also aggressive with hospital staff. Patient is calm and resting in bed when not bothered

## 2022-09-17 NOTE — ED Notes (Signed)
[facility] called for transport.  

## 2022-09-17 NOTE — Discharge Instructions (Addendum)
Follow up with ophthalmology for recheck as allowed for further exam.  Follow up with your palliative care provider.

## 2022-09-17 NOTE — Progress Notes (Signed)
Chaplain responded to Level 2.  Pt in xray. Chaplain available if needed. Rev. Lynnell Chad Pager [phone]

## 2022-09-17 NOTE — ED Notes (Signed)
Bp 170/82

## 2022-09-17 NOTE — Consult Note (Signed)
Reason for Consult: Intracranial hemorrhage Referring Physician: Dr. Aurea Graff Farney is an 81 y.o. male.  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  By report he is a nursing home patient.  He is in palliative care.  He took a fall.  He was brought to the ER.  Head scan was obtained which demonstrated traumatic subarachnoid hemorrhage and his old strokes.  A neurosurgical consultation was requested.  Also had a eye injury and the ophthalmologist has been contacted.  By report does not have any family members available and by report his church is his guardian guardian.  Past Medical History:  Diagnosis Date   Hypertension    Stroke [facility])    "a long time ago -- 2 years ago"    Past Surgical History:  Procedure Laterality Date   IR ANGIO INTRA EXTRACRAN SEL COM CAROTID INNOMINATE BILAT MOD SED  02/15/2018   IR ANGIO VERTEBRAL SEL SUBCLAVIAN INNOMINATE UNI R MOD SED  02/15/2018   IR ANGIO VERTEBRAL SEL VERTEBRAL UNI L MOD SED  02/15/2018    History reviewed. No pertinent family history.  Social History:  reports that he has never smoked. He has never used smokeless tobacco. He reports that he does not drink alcohol and does not use drugs.  Allergies: No Known Allergies  Medications: I have reviewed the patient's current medications. Prior to Admission: (Not in a hospital admission)  Scheduled: Continuous: PRN: Anti-infectives (From admission, onward)    None        Results for orders placed or performed during the hospital encounter of 09/17/22 (from the past 48 hour(s))  Comprehensive metabolic panel     Status: Abnormal   Collection Time: 09/17/22  6:34 AM  Result Value Ref Range   Sodium 144 135 - 145 mmol/L   Potassium 4.6 3.5 - 5.1 mmol/L   Chloride 110 98 - 111 mmol/L   CO2 22 22 - 32 mmol/L   Glucose, Bld 135  (H) 70 - 99 mg/dL    Comment: Glucose reference range applies only to samples taken after fasting for at least 8 hours.   BUN 15 8 - 23 mg/dL   Creatinine, Ser 7.67 0.61 - 1.24 mg/dL   Calcium 9.3 8.9 - 20.9 mg/dL   Total Protein 8.1 6.5 - 8.1 g/dL   Albumin 3.6 3.5 - 5.0 g/dL   AST 22 15 - 41 U/L   ALT 16 0 - 44 U/L   Alkaline Phosphatase 78 38 - 126 U/L   Total Bilirubin 0.8 0.3 - 1.2 mg/dL   GFR, Estimated 60 (L) >60 mL/min    Comment: (NOTE) Calculated using the CKD-EPI Creatinine Equation (2021)    Anion gap 12 5 - 15    Comment: Performed at [facility], [address] [place], [place] [address]  CBC     Status: Abnormal   Collection Time: 09/17/22  6:34 AM  Result Value Ref Range   WBC 10.3 4.0 - 10.5 K/uL   RBC 5.64 4.22 - 5.81 MIL/uL   Hemoglobin 14.7 13.0 - 17.0 g/dL   HCT 28.3 66.2 - 94.7 %   MCV 80.9 80.0 - 100.0 fL   MCH 26.1 26.0 - 34.0 pg   MCHC 32.2 30.0 - 36.0 g/dL   RDW 65.4 (H) 65.0 - 35.4 %   Platelets 253 150 - 400 K/uL  nRBC 0.0 0.0 - 0.2 %    Comment: Performed at [facility], [address] [place], [place] [address]  Protime-INR     Status: None   Collection Time: 09/17/22  6:34 AM  Result Value Ref Range   Prothrombin Time 13.1 11.4 - 15.2 seconds   INR 1.0 0.8 - 1.2    Comment: (NOTE) INR goal varies based on device and disease states. Performed at [facility], [address] [place], [place] [address]   CK     Status: None   Collection Time: 09/17/22  6:34 AM  Result Value Ref Range   Total CK 71 49 - 397 U/L    Comment: Performed at [facility], [address], [place], [place] [address]  I-Stat Chem 8, ED     Status: Abnormal   Collection Time: 09/17/22  7:08 AM  Result Value Ref Range   Sodium 144 135 - 145 mmol/L   Potassium 4.5 3.5 - 5.1 mmol/L   Chloride 110 98 - 111 mmol/L   BUN 17 8 - 23 mg/dL   Creatinine, Ser 1.63 0.61 - 1.24 mg/dL   Glucose, Bld 846 (H) 70 - 99 mg/dL    Comment: Glucose  reference range applies only to samples taken after fasting for at least 8 hours.   Calcium, Ion 1.15 1.15 - 1.40 mmol/L   TCO2 22 22 - 32 mmol/L   Hemoglobin 15.6 13.0 - 17.0 g/dL   HCT 65.9 93.5 - 70.1 %    CT Head Wo Contrast  Addendum Date: 09/17/2022   ADDENDUM REPORT: 09/17/2022 07:45 ADDENDUM: Study discussed by telephone with PA LAURA MURPHY on 09/17/2022 at 07:36 . Electronically Signed   By: Odessa Fleming M.D.   On: 09/17/2022 07:45   Result Date: 09/17/2022 CLINICAL DATA:  81 year old male status post fall, trauma. Combative. EXAM: CT HEAD WITHOUT CONTRAST TECHNIQUE: Contiguous axial images were obtained from the base of the skull through the vertex without intravenous contrast. RADIATION DOSE REDUCTION: This exam was performed according to the departmental dose-optimization program which includes automated exposure control, adjustment of the mA and/or kV according to patient size and/or use of iterative reconstruction technique. COMPARISON:  Head CT 08/08/2018.  Brain MRI 02/13/2018. FINDINGS: Brain: Substantially progressed, multifocal bilateral chronic brain encephalomalacia since Jan 25, 2018. Pronounced left frontal lobe encephalomalacia now. Chronic posterior right temporal lobe encephalomalacia. Ex vacuo ventricular enlargement especially on the left. Small volume scattered bilateral acute subarachnoid hemorrhage superimposed on the dominant areas of encephalomalacia in both hemispheres (series 7, images 19 and 21 clump) , Also within the right inferior frontal gyrus and cingulate sulcus (image 24). Trace para falcine subdural blood suspected on the right series 5, image 26. Underlying dural calcification of the falx otherwise appears stable. No other convincing subdural hematoma. But no associated IVH. No intracranial mass effect. Basilar cisterns remain normal. No midline shift. No acute cortically based infarct identified. Vascular: Extensive Calcified atherosclerosis at the skull base. Skull: No  acute osseous abnormality identified. Sinuses/Orbits: New partial opacification of the right middle ear and mastoids since 2018/01/25. Other Visualized paranasal sinuses and mastoids are stable and well aerated. Other: Left forehead, supraorbital scalp hematoma measures up to 12 mm in thickness. Underlying left frontal bone appears intact. Partially visible left globe appears diffusely hyperdense which is new since 2018/01/25 but might be postoperative. There is rightward gaze of the right globe. No orbital wall fracture is evident. Superimposed calcified scalp vessel atherosclerosis. IMPRESSION: 1.  Positive for small volume but scattered bilateral Acute Subarachnoid Hemorrhage, and small volume parafalcine Subdural Hematoma. 2. Underlying extensive chronic encephalomalacia, especially the left frontal lobe. No intracranial mass effect. 3. Left forehead scalp hematoma. No acute skull or orbital fracture identified. But the left globe is diffusely hyperdense which is new since 21-Jan-2018. Correlate for interval ophthalmological surgery to that lobe. 4. New partial opacification of the right middle ear and mastoids since 01-21-18 appears inflammatory/postinflammatory. Electronically Signed: By: Odessa Fleming M.D. On: 09/17/2022 07:31   CT Cervical Spine Wo Contrast  Result Date: 09/17/2022 CLINICAL DATA:  81 year old male status post fall, trauma. Combative. On blood thinners. EXAM: CT CERVICAL SPINE WITHOUT CONTRAST TECHNIQUE: Multidetector CT imaging of the cervical spine was performed without intravenous contrast. Multiplanar CT image reconstructions were also generated. RADIATION DOSE REDUCTION: This exam was performed according to the departmental dose-optimization program which includes automated exposure control, adjustment of the mA and/or kV according to patient size and/or use of iterative reconstruction technique. COMPARISON:  CT head and face today reported separately. CTA neck 02/13/2018. Chest CT 02/16/2018. FINDINGS:  Alignment: Chronic straightening but increased reversal of cervical lordosis compared to the 2018-01-21 CTA. Stable cervicothoracic junction alignment. Bilateral posterior element alignment is within normal limits. Skull base and vertebrae: Visualized skull base is intact. No atlanto-occipital dissociation. C1 and C2 appear intact and aligned. No acute osseous abnormality identified. Soft tissues and spinal canal: No prevertebral fluid or swelling. No visible canal hematoma. Bulky calcified cervical carotid atherosclerosis. Chronic coarsely calcified left thyroid lobe is stable In the setting of significant comorbidities or limited life expectancy, no follow-up recommended (ref: J Am Coll Radiol. 2015 Feb;12(2): 143-50). Otherwise negative visible noncontrast neck soft tissues. Disc levels: Chronic cervical disc and endplate degeneration. Multilevel degenerative cervical spinal stenosis is probably stable since Jan 21, 2018, most pronounced at C4-C5 and C5-C6. Upper chest: Partially visible chronic scarring and architectural distortion in the left lung apex does not appear significantly changed. Less pronounced chronic right apical lung scarring. Visible upper thoracic levels appear grossly intact. IMPRESSION: 1. No acute traumatic injury identified in the cervical spine. 2. Chronic cervical spine degeneration with multilevel spinal stenosis, no significant progression since 2018-01-21. 3. Left > right chronic apical lung scarring and distortion. Electronically Signed   By: Odessa Fleming M.D.   On: 09/17/2022 07:44   CT Maxillofacial Wo Contrast  Result Date: 09/17/2022 CLINICAL DATA:  81 year old male status post fall, trauma. Combative. EXAM: CT MAXILLOFACIAL WITHOUT CONTRAST TECHNIQUE: Multidetector CT imaging of the maxillofacial structures was performed. Multiplanar CT image reconstructions were also generated. RADIATION DOSE REDUCTION: This exam was performed according to the departmental dose-optimization program which includes  automated exposure control, adjustment of the mA and/or kV according to patient size and/or use of iterative reconstruction technique. COMPARISON:  Head CT today reported separately. Prior head CT 08/08/2018. FINDINGS: Osseous: Carious posterior maxillary dentition worse on the left. Dental periapical lucency there. Mild motion artifact about the mouth. But the mandible appears intact with normal bilateral TMJ alignment. No acute maxilla fracture identified. Bilateral zygoma and pterygoid bones appear intact. No acute nasal bone fracture identified. Central skull base appears intact. Cervical spine is detailed separately today. Orbits: Intact orbital walls. Large left periorbital, supraorbital scalp hematoma is up to 3 cm thick. Left globe appears small and hyperdense. Lens is not identified now. These findings are new since 01/21/18. But the contralateral right orbits soft tissues appear negative aside from lateral gaze deviation. Sinuses: Minor maxillary sinus alveolar recess mucosal  thickening. Other paranasal sinuses are well aerated. Subtotal new right middle ear and mastoid opacification with debris in the external auditory canal. Left middle ear and mastoids remain better pneumatized. Soft tissues: Left forehead, periorbital scalp hematoma as above. No scalp soft tissue gas identified. Bulky cervical carotid calcified atherosclerosis. Otherwise negative visible noncontrast deep soft tissue spaces of the face. Limited intracranial: Positive for chronic encephalomalacia with superimposed acute intracranial hemorrhage, detailed separately today. IMPRESSION: 1. Relatively large left periorbital / supraorbital scalp hematoma. But no underlying acute orbit or Face fracture identified. 2. But small and hyperdense left globe is new since 2019. Differential considerations include vitreous hemorrhage, versus interval ophthalmological surgery. 3. Carious posterior maxillary dentition. 4. Head CT and cervical spine are  reported separately. Study discussed by telephone with PA LAURA MURPHY on 09/17/2022 at 07:36 . Electronically Signed   By: Odessa FlemingH  Hall M.D.   On: 09/17/2022 07:41   DG Pelvis Portable  Result Date: 09/17/2022 CLINICAL DATA:  81 year old male status post fall, trauma. Combative. EXAM: PORTABLE PELVIS 1-2 VIEWS COMPARISON:  CT Abdomen and Pelvis 04/08/2018. FINDINGS: Portable AP supine view at 0640 hours. Femoral heads remain normally located. Pelvis appears stable and intact. SI joint and symphysis appears stable. Grossly intact proximal femurs. There are chronic retained metallic foreign bodies which are posterior to the proximal right femur. Negative visible bowel gas pattern. Probably external wire-like metal foreign body also projects at the medial left thigh. IMPRESSION: No acute fracture or dislocation identified about the pelvis. Electronically Signed   By: Odessa FlemingH  Hall M.D.   On: 09/17/2022 07:33   DG Chest Port 1 View  Result Date: 09/17/2022 CLINICAL DATA:  81 year old male status post fall, trauma. Combative. EXAM: PORTABLE CHEST 1 VIEW COMPARISON:  Chest radiographs 04/07/2018, CT 02/16/2018 and earlier. FINDINGS: Portable AP supine view at 0640 hours. Chronically reduced left lung volume, with left apical architectural distortion tracking to the hilum. This appears not significantly changed from 2019, value aided with CT at that time. Mildly lower lung volumes. Stable cardiac size and mediastinal contours. No pneumothorax, pulmonary edema, or acute pulmonary opacity identified. Coarsely calcified chronic left thyroid nodule at the thoracic inlet again noted. No acute osseous abnormality identified. Negative visible bowel gas. IMPRESSION: 1. No acute cardiopulmonary abnormality or acute traumatic injury identified. 2. Chronically left lung scarring and volume loss not significantly changed since 2019. Electronically Signed   By: Odessa FlemingH  Hall M.D.   On: 09/17/2022 07:13    ROS: Unobtainable Blood pressure  132/74, pulse 99, temperature 98 F (36.7 C), temperature source Axillary, resp. rate 18, height 5\' 6"  (1.676 m), weight 59 kg, SpO2 100 %. Estimated body mass index is 20.99 kg/m as calculated from the following:   Height as of this encounter: 5\' 6"  (1.676 m).   Weight as of this encounter: 59 kg.  Physical Exam  General: Thin, unkempt, right hemiplegic, aphasic age and male with right contractures.  HEENT: The patient has tenderness and left periorbital ecchymosis/swelling.  I cannot visualize his left pupil.  His right pupil is approximately 4 mm.  Neck: Unremarkable  Thorax: Unremarkable  Abdomen: Thin  Extremities: He has a right contractures.  Neurologic exam: The patient is aphasic.  He is right hemiplegic.  He is purposeful on the left.  I have reviewed the patient's head CT.  He has small multiple areas of traumatic subarachnoid hemorrhage.  He has old strokes.  Assessment/Plan: Traumatic subarachnoid hemorrhage: I have discussed the situation with the ER PA.  The patient does not need surgery.  Furthermore he is not a candidate for surgery even if he needed it.  Therefore I do not see any need for further scanning.  Cristi Loron 09/17/2022, 11:06 AM

## 2022-09-17 NOTE — ED Notes (Signed)
Neuro surgery at bedside.

## 2022-09-17 NOTE — ED Notes (Signed)
This RN called facility to give report on patient returning

## 2022-09-17 NOTE — ED Notes (Signed)
Pupils unable to be examined fights with any type  examine

## 2022-09-17 NOTE — Progress Notes (Signed)
Orthopedic Tech Progress Note Patient Details:  [facility] [place] Feb 04, 1941 [id number]  Patient ID: Keylen Eckenrode, male   DOB: 1941-08-17, 81 y.o.   MRN: [id number] Level II; not currently needed. Darleen Crocker 09/17/2022, 6:37 AM

## 2022-09-17 NOTE — ED Notes (Signed)
The pt arrived by ptar from [facility] he was found in the floor large amount of swelling in his lt eyebrow blood running down his face and dried the pt does not move his rt arm and leg from an old stroke he does not speak english   ems could not handle him he was too combative

## 2022-09-17 NOTE — ED Triage Notes (Signed)
Found in the floor at [facility] pt combative rt arm and rt leg not moving from an old stroke  the pt was combative initially then calmed down   large laceration with swelling to his lt eyebrow  on blood thinner plavix  and aspirin.  Mnon verbal normally

## 2022-09-18 ENCOUNTER — Non-Acute Institutional Stay: Payer: Medicare Other | Admitting: Hospice

## 2022-09-18 ENCOUNTER — Encounter: Payer: Self-pay | Admitting: Ophthalmology

## 2022-09-18 DIAGNOSIS — Z515 Encounter for palliative care: Secondary | ICD-10-CM

## 2022-09-18 DIAGNOSIS — F339 Major depressive disorder, recurrent, unspecified: Secondary | ICD-10-CM

## 2022-09-18 DIAGNOSIS — F0153 Vascular dementia, unspecified severity, with mood disturbance: Secondary | ICD-10-CM

## 2022-09-18 DIAGNOSIS — E43 Unspecified severe protein-calorie malnutrition: Secondary | ICD-10-CM

## 2022-09-18 DIAGNOSIS — W19XXXD Unspecified fall, subsequent encounter: Secondary | ICD-10-CM

## 2022-09-18 NOTE — Progress Notes (Signed)
I was Called by ED PA re: CT MXF results showing interval change in the appearance of OS.    PA and ED MD unable to examine OS as patient agitated at exam attempts, even attempting to bite.    I reviewed the CT.  OS globe is round and intact.    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       I reviewed prior notes from and communicated with Drs Charlotte Sanes and Dr Laruth Bouchard who had been called t o evaluate OS previously after ED visits.   OS has a history of mature cataract and intermittent angle closure.   The eye has limited if any visual potential, and has been treated for comfort care only.      I spoke to the patients prior roommate listed as primary contact in epic.  She was with the patient in ED.    She has noted the patient's left eye to look progressively smaller and cloudy over the past 2 years.     I bel ieve OS had no significant visual potential.   I don't believe an examination under anesthesia is warranted or justified to confirm this.    His roommate has my contact info and will reach out to me for any needs.

## 2022-09-18 NOTE — Progress Notes (Signed)
Therapist, nutritional Palliative Care Consult Note Telephone: [phone]  Fax: [phone]  PATIENT NAME: Jorge Burns DOB: December 27, 1940 MRN: [id number]  PRIMARY CARE PROVIDER:   Dr. Charlynne Pander   REFERRING PROVIDER:   Dr. Charlynne Pander   RESPONSIBLE PARTY:  Jorge Burns Contact Information     Contact Information     Name Relation Home Work [place], [place] Granddaughter [phone]  [phone]   Jorge Burns   [phone]       Visit is to build trust and highlight Palliative Medicine as specialized medical care for people living with serious illness, aimed at facilitating better quality of life through symptoms relief, assisting with advance care planning and complex medical decision making. This is a follow up visit.  RECOMMENDATIONS/PLAN:   Advance Care Planning/Code Status: Patient is a Do Not Resuscitate  Goals of Care: Goals of care include to maximize quality of life and symptom management.   Palliative care team will continue to support patient, patient's family, and medical team.  Symptom management/Plan:  Fall: Fall yesterday, hit head and was seen at ED not admitted. CT scan of the head showed small head bleed. Neurosurgery consulted -  does not recommend any further workup, no facial fractures, hematoma to left eye brow area, resolving. Continue to clean left brow with NS, apply antibiotic ointment as ordered.  Continue fall precautions Protein caloric malnutrition: Current weight is 95.2 Ibs up from 89.6 Nov '23 Height 58 inches BMI 19.85.  Albumin 3.6 09/17/22. Continue to Take Ensure twice daily; Boost daily, offer small meals 4-6 times a day, provide assistance during meals to ensure adequate oral intake. Monitor weight per facility protocol.   Routine CBC CMP.  Nursing reports he often refuses his medications; sometimes refusing to eat.  Hypertension: Continue clonidine patch.  Monitor BP with routine vital signs. Dementia: Severe dementia -  progressive memory loss/confusion. Bedbound, incontinent of bowel and bladder, Currently FAST 7b total care.  Encourage socialization, promote calm approach and engaging environment. Use gentle persuasion, redirection and re-approach when patient refuses care.  Depression: Followed by Psych. Continue Mirtazapine as ordered. Encourage interactions and participation in facility activities.  Psych consult as scheduled.  Follow up: Palliative care will continue to follow for complex medical decision making, advance care planning, and clarification of goals. Return 6 weeks or prn. Encouraged to call provider sooner with any concerns.  CHIEF COMPLAINT: Palliative follow up  HISTORY OF PRESENT ILLNESS:  Jorge Burns a 81 y.o. male with multiple medical problems including protein caloric malnutrition, advanced vascular dementia, hemiplegia and hemiparesis following cerebral infarction affecting right dominant side, CVA in 2019, depression, anxiety, Dysphagia. History obtained from review of EMR, discussion with nursing staff, family and/or patient. Records reviewed and summarized above. All 10 point systems reviewed and are negative except as documented in history of present illness above  Review and summarization of Epic records shows history from other than patient.   Palliative Care was asked to follow this patient o help address complex decision making in the context of advance care planning and goals of care clarification.    PERTINENT MEDICATIONS:  Outpatient Encounter Medications as of 09/18/2022  Medication Sig   aspirin 81 MG chewable tablet Chew 1 tablet (81 mg total) by mouth daily.   atorvastatin (LIPITOR) 80 MG tablet Take 1 tablet (80 mg total) by mouth daily at 6 PM.   brimonidine (ALPHAGAN) 0.2 % ophthalmic solution Place 1 drop into the left eye 2 (two)  times daily.   citalopram (CELEXA) 10 MG tablet Take 1 tablet (10 mg total) by mouth daily.   clopidogrel (PLAVIX) 75 MG tablet Take 1  tablet (75 mg total) by mouth daily.   dorzolamide (TRUSOPT) 2 % ophthalmic solution Place 1 drop into the left eye 3 (three) times daily.   dorzolamidel-timolol (COSOPT) 22.3-6.8 MG/ML SOLN ophthalmic solution Place 1 drop into the left eye 2 (two) times daily.   HYDROcodone-acetaminophen (NORCO/VICODIN) 5-325 MG tablet Take 1 tablet by mouth every 6 (six) hours as needed.   hydrocortisone (ANUSOL-HC) 2.5 % rectal cream Apply rectally 2 times daily   latanoprost (XALATAN) 0.005 % ophthalmic solution Place 1 drop into the left eye at bedtime.   lisinopril (PRINIVIL,ZESTRIL) 2.5 MG tablet Take 1 tablet (2.5 mg total) by mouth daily.   methylphenidate (RITALIN) 10 MG tablet Take 1 tablet (10 mg total) by mouth 2 (two) times daily with breakfast and lunch.   pilocarpine (PILOCAR) 1 % ophthalmic solution Place 1 drop into the left eye 4 (four) times daily.   potassium chloride SA (K-DUR,KLOR-CON) 20 MEQ tablet Take 1 tablet (20 mEq total) by mouth daily.   No facility-administered encounter medications on file as of 09/18/2022.    HOSPICE ELIGIBILITY/DIAGNOSIS: TBD  PAST MEDICAL HISTORY:  Past Medical History:  Diagnosis Date   Hypertension    Stroke [facility])    "a long time ago -- 2 years ago"     ALLERGIES: No Known Allergies    I spent 45 minutes providing this consultation; this includes time spent with patient/family, chart review and documentation. More than 50% of the time in this consultation was spent on counseling and coordinating communication   Thank you for the opportunity to participate in the care of [name] Please call our office at [phone] if we can be of additional assistance.  Note: Portions of this note were generated with Scientist, clinical (histocompatibility and immunogenetics). Dictation errors may occur despite best attempts at proofreading.  Rosaura Carpenter, NP

## 2022-10-16 ENCOUNTER — Non-Acute Institutional Stay: Payer: Medicare Other | Admitting: Hospice

## 2022-10-16 DIAGNOSIS — F0153 Vascular dementia, unspecified severity, with mood disturbance: Secondary | ICD-10-CM

## 2022-10-16 DIAGNOSIS — F339 Major depressive disorder, recurrent, unspecified: Secondary | ICD-10-CM

## 2022-10-16 DIAGNOSIS — Z515 Encounter for palliative care: Secondary | ICD-10-CM

## 2022-10-16 DIAGNOSIS — E43 Unspecified severe protein-calorie malnutrition: Secondary | ICD-10-CM

## 2022-10-16 DIAGNOSIS — W19XXXD Unspecified fall, subsequent encounter: Secondary | ICD-10-CM

## 2022-10-16 NOTE — Progress Notes (Signed)
[place] Consult Note Telephone: [phone]  Fax: [phone]  PATIENT NAME: Jorge Burns DOB: 1940/11/12 MRN: [id number]  PRIMARY CARE PROVIDER:   Dr. Caprice Renshaw   REFERRING PROVIDER:   Dr. Caprice Renshaw   RESPONSIBLE PARTY:  Jorge Burns Contact Information     Contact Information     Name Relation Home Work [place], [place] Granddaughter [phone]  [phone]   Jorge Burns   [phone]       Visit is to build trust and highlight Palliative Medicine as specialized medical care for people living with serious illness, aimed at facilitating better quality of life through symptoms relief, assisting with advance care planning and complex medical decision making. This is a follow up visit.  RECOMMENDATIONS/PLAN:   Advance Care Planning/Code Status: Patient is a Do Not Resuscitate  Goals of Care: Goals of care include to maximize quality of life and symptom management.   Palliative care team will continue to support patient, patient's family, and medical team.  Symptom management/Plan:  Fall: No report of fall since last visit.  Continue safety/fall precautions. Protein caloric malnutrition: Current weight is 95.2 Ibs up from 89.6 Nov '23 Height 58 inches BMI 19.85.  Albumin 3.6 09/17/22.  ST consult as needed.  Continue facility Frus[place] nutrition and treat, fortified foods, Ensure.  Provide assistance during meals to ensure adequate oral intake. Monitor weight per facility protocol.   Routine CBC CMP.  Nursing reports he often refuses his medications; sometimes refusing to eat.  Dementia: Severe dementia - progressive memory loss/confusion. Bedbound, incontinent of bowel and bladder, Currently FAST 7b total care.  Encourage socialization, promote calm approach and engaging environment. Use gentle persuasion, redirection and re-approach when patient refuses care.  FLACC 0. Follow-up with psych as planned. Depression: Followed by  Psych. Continue Mirtazapine as ordered. Encourage interactions and participation in facility activities.  Psych consult as scheduled.  Follow up: Palliative care will continue to follow for complex medical decision making, advance care planning, and clarification of goals. Return 6 weeks or prn. Encouraged to call provider sooner with any concerns.  CHIEF COMPLAINT: Palliative follow up  HISTORY OF PRESENT ILLNESS:  Jorge Burns a 82 y.o. male with multiple medical problems including protein caloric malnutrition, advanced vascular dementia, hemiplegia and hemiparesis following cerebral infarction affecting right dominant side, CVA in 2019, depression, anxiety, Dysphagia. History obtained from review of EMR, discussion with nursing staff, family and/or patient. Records reviewed and summarized above. All 10 point systems reviewed and are negative except as documented in history of present illness above  Review and summarization of Epic records shows history from other than patient.   Palliative Care was asked to follow this patient o help address complex decision making in the context of advance care planning and goals of care clarification.    PERTINENT MEDICATIONS:  Outpatient Encounter Medications as of 10/16/2022  Medication Sig   aspirin 81 MG chewable tablet Chew 1 tablet (81 mg total) by mouth daily.   atorvastatin (LIPITOR) 80 MG tablet Take 1 tablet (80 mg total) by mouth daily at 6 PM.   brimonidine (ALPHAGAN) 0.2 % ophthalmic solution Place 1 drop into the left eye 2 (two) times daily.   citalopram (CELEXA) 10 MG tablet Take 1 tablet (10 mg total) by mouth daily.   clopidogrel (PLAVIX) 75 MG tablet Take 1 tablet (75 mg total) by mouth daily.   dorzolamide (TRUSOPT) 2 % ophthalmic solution Place 1 drop into the left  eye 3 (three) times daily.   dorzolamidel-timolol (COSOPT) 22.3-6.8 MG/ML SOLN ophthalmic solution Place 1 drop into the left eye 2 (two) times daily.    HYDROcodone-acetaminophen (NORCO/VICODIN) 5-325 MG tablet Take 1 tablet by mouth every 6 (six) hours as needed.   hydrocortisone (ANUSOL-HC) 2.5 % rectal cream Apply rectally 2 times daily   latanoprost (XALATAN) 0.005 % ophthalmic solution Place 1 drop into the left eye at bedtime.   lisinopril (PRINIVIL,ZESTRIL) 2.5 MG tablet Take 1 tablet (2.5 mg total) by mouth daily.   methylphenidate (RITALIN) 10 MG tablet Take 1 tablet (10 mg total) by mouth 2 (two) times daily with breakfast and lunch.   pilocarpine (PILOCAR) 1 % ophthalmic solution Place 1 drop into the left eye 4 (four) times daily.   potassium chloride SA (K-DUR,KLOR-CON) 20 MEQ tablet Take 1 tablet (20 mEq total) by mouth daily.   No facility-administered encounter medications on file as of 10/16/2022.    HOSPICE ELIGIBILITY/DIAGNOSIS: TBD  PAST MEDICAL HISTORY:  Past Medical History:  Diagnosis Date   Hypertension    Stroke [facility])    "a long time ago -- 2 years ago"     ALLERGIES: No Known Allergies    I spent 35 minutes providing this consultation; this includes time spent with patient/family, chart review and documentation. More than 50% of the time in this consultation was spent on counseling and coordinating communication   Thank you for the opportunity to participate in the care of [place] Please call our office at [phone] if we can be of additional assistance.  Note: Portions of this note were generated with Lobbyist. Dictation errors may occur despite best attempts at proofreading.  Teodoro Spray, NP

## 2022-11-10 ENCOUNTER — Non-Acute Institutional Stay: Payer: Medicare Other | Admitting: Hospice

## 2022-11-10 DIAGNOSIS — Z515 Encounter for palliative care: Secondary | ICD-10-CM

## 2022-11-10 DIAGNOSIS — E43 Unspecified severe protein-calorie malnutrition: Secondary | ICD-10-CM

## 2022-11-10 DIAGNOSIS — F339 Major depressive disorder, recurrent, unspecified: Secondary | ICD-10-CM

## 2022-11-10 DIAGNOSIS — F0153 Vascular dementia, unspecified severity, with mood disturbance: Secondary | ICD-10-CM

## 2022-11-10 NOTE — Progress Notes (Signed)
[place] Consult Note Telephone: [phone]  Fax: [phone]  PATIENT NAME: Jorge Burns DOB: 29-Nov-1940 MRN: [id number]  PRIMARY CARE PROVIDER:   Dr. Caprice Renshaw   REFERRING PROVIDER:   Dr. Caprice Renshaw   RESPONSIBLE PARTY:  [name]     Contact Information     Name Relation Home Work [place], [place] Granddaughter [phone]  [phone]   [name]   [id number]       Visit is to build trust and highlight Palliative Medicine as specialized medical care for people living with serious illness, aimed at facilitating better quality of life through symptoms relief, assisting with advance care planning and complex medical decision making. This is a follow up visit.  RECOMMENDATIONS/PLAN:   Advance Care Planning/Code Status: Patient is a Do Not Resuscitate  Goals of Care: Goals of care include to maximize quality of life and symptom management.   Palliative care team will continue to support patient, patient's family, and medical team.  Symptom management/Plan:   Protein caloric malnutrition: Current weight is 98.2 Ibs 10/23/22, 95.2 Ibs last month up from 89.6 Nov '23 Height 58 inches  Albumin 3.6 09/17/22.  ST consult as needed.  Continue facility AHR frozen nutrition and treat, fortified foods, Ensure.  Provide assistance during meals to ensure adequate oral intake. Monitor weight per facility protocol.   Routine CBC CMP.     Dementia: Severe dementia - progressive memory loss/confusion. Bedbound, incontinent of bowel and bladder in line with Dementia disease trajectory.  Currently FAST 7b, total care/max assist in ADLs.  Encourage socialization, promote calm approach and engaging environment. Use gentle persuasion, redirection and re-approach when patient refuses care.  FLACC 0. Follow-up with psych as planned.  Depression: Followed by Psych. Continue Mirtazapine as ordered. Encourage interactions  and participation in facility activities.  Psych consult as scheduled.   Follow up: Palliative care will continue to follow for complex medical decision making, advance care planning, and clarification of goals. Return 6 weeks or prn. Encouraged to call provider sooner with any concerns.  CHIEF COMPLAINT: Palliative follow up  HISTORY OF PRESENT ILLNESS:  Jorge Burns a 82 y.o. male with multiple medical problems including protein caloric malnutrition, advanced vascular dementia, hemiplegia and hemiparesis following cerebral infarction affecting right dominant side, CVA in 2019, depression, anxiety, Dysphagia. History obtained from review of EMR, discussion with nursing staff, family and/or patient. Patient denies pain/discomfort, FLACC 0. Records reviewed and summarized above. All 10 point systems reviewed and are negative except as documented in history of present illness above  Review and summarization of Epic records shows history from other than patient.   Palliative Care was asked to follow this patient o help address complex decision making in the context of advance care planning and goals of care clarification.    PERTINENT MEDICATIONS:  Outpatient Encounter Medications as of 11/10/2022  Medication Sig   aspirin 81 MG chewable tablet Chew 1 tablet (81 mg total) by mouth daily.   atorvastatin (LIPITOR) 80 MG tablet Take 1 tablet (80 mg total) by mouth daily at 6 PM.   brimonidine (ALPHAGAN) 0.2 % ophthalmic solution Place 1 drop into the left eye 2 (two) times daily.   citalopram (CELEXA) 10 MG tablet Take 1 tablet (10 mg total) by mouth daily.   clopidogrel (PLAVIX) 75 MG tablet Take 1 tablet (75 mg total) by mouth daily.   dorzolamide (TRUSOPT) 2 % ophthalmic solution Place 1 drop into the  left eye 3 (three) times daily.   dorzolamidel-timolol (COSOPT) 22.3-6.8 MG/ML SOLN ophthalmic solution Place 1 drop into the left eye 2 (two) times daily.   HYDROcodone-acetaminophen (NORCO/VICODIN)  5-325 MG tablet Take 1 tablet by mouth every 6 (six) hours as needed.   hydrocortisone (ANUSOL-HC) 2.5 % rectal cream Apply rectally 2 times daily   latanoprost (XALATAN) 0.005 % ophthalmic solution Place 1 drop into the left eye at bedtime.   lisinopril (PRINIVIL,ZESTRIL) 2.5 MG tablet Take 1 tablet (2.5 mg total) by mouth daily.   methylphenidate (RITALIN) 10 MG tablet Take 1 tablet (10 mg total) by mouth 2 (two) times daily with breakfast and lunch.   pilocarpine (PILOCAR) 1 % ophthalmic solution Place 1 drop into the left eye 4 (four) times daily.   potassium chloride SA (K-DUR,KLOR-CON) 20 MEQ tablet Take 1 tablet (20 mEq total) by mouth daily.   No facility-administered encounter medications on file as of 11/10/2022.    HOSPICE ELIGIBILITY/DIAGNOSIS: TBD  PAST MEDICAL HISTORY:  Past Medical History:  Diagnosis Date   Hypertension    Stroke [facility])    "a long time ago -- 2 years ago"     ALLERGIES: No Known Allergies    I spent 35 minutes providing this consultation; this includes time spent with patient/family, chart review and documentation. More than 50% of the time in this consultation was spent on counseling and coordinating communication   Thank you for the opportunity to participate in the care of [place] Please call our office at [phone] if we can be of additional assistance.  Note: Portions of this note were generated with Lobbyist. Dictation errors may occur despite best attempts at proofreading.  Teodoro Spray, NP

## 2022-12-11 ENCOUNTER — Non-Acute Institutional Stay: Payer: Medicare Other | Admitting: Hospice

## 2022-12-11 DIAGNOSIS — F0153 Vascular dementia, unspecified severity, with mood disturbance: Secondary | ICD-10-CM

## 2022-12-11 DIAGNOSIS — F339 Major depressive disorder, recurrent, unspecified: Secondary | ICD-10-CM

## 2022-12-11 DIAGNOSIS — Z515 Encounter for palliative care: Secondary | ICD-10-CM

## 2022-12-11 DIAGNOSIS — E43 Unspecified severe protein-calorie malnutrition: Secondary | ICD-10-CM

## 2022-12-11 NOTE — Progress Notes (Signed)
[place] Consult Note Telephone: [phone]  Fax: [phone]  PATIENT NAME: Jorge Burns DOB: [birth year] MRN: [id number]  PRIMARY CARE PROVIDER:   Dr. Caprice Renshaw   REFERRING PROVIDER:   Dr. Caprice Renshaw   RESPONSIBLE PARTY:  [name]     Contact Information     Name Relation Home Work [place], [place] Granddaughter [phone]  [phone]   [name]   [id number]       Visit is to build trust and highlight Palliative Medicine as specialized medical care for people living with serious illness, aimed at facilitating better quality of life through symptoms relief, assisting with advance care planning and complex medical decision making. This is a follow up visit.  RECOMMENDATIONS/PLAN:   Advance Care Planning/Code Status: Patient is a Do Not Resuscitate  Goals of Care: Goals of care include to maximize quality of life and symptom management.  MOST form selections include comfort measures.    Palliative care team will continue to support patient, patient's family, and medical team.  Symptom management/Plan:  Comfort measures Protein caloric malnutrition: Continue facility AHR frozen nutrition and treat, fortified foods, Ensure.  Provide assistance during meals to ensure adequate oral intake.  Current weight is 98.2 Ibs 10/23/22, 95.2 Ibs Jan '24,  89.6 Nov '23 Height 58 inches  Albumin 3.6 09/17/22.  ST consult as needed.  Weight weekly x 4.  Routine CBC CMP.     Dementia: Severe dementia - progressive memory loss/confusion, impoverished thought, bedbound, incontinent of bowel and bladder Currently FAST 7b, total care/max assist in ADLs. Fall/safety precautions.  Out of bed daily.  Encourage socialization, promote calm approach and engaging environment. Use gentle persuasion, redirection and re-approach when patient refuses care.  FLACC 0. Follow-up with psych as planned.  Depression: Followed by  Psych. Continue Mirtazapine as ordered. Encourage interactions and participation in facility activities.  Psych consult as scheduled.   Follow up: Palliative care will continue to follow for complex medical decision making, advance care planning, and clarification of goals. Return 6 weeks or prn. Encouraged to call provider sooner with any concerns.  CHIEF COMPLAINT: Palliative follow up  HISTORY OF PRESENT ILLNESS:  Jorge Burns a 82 y.o. male with multiple medical problems including protein caloric malnutrition, advanced vascular dementia, hemiplegia and hemiparesis following cerebral infarction affecting right dominant side, CVA in 2019, depression, anxiety, Dysphagia. Patient is a poor historian due to cognitive and language deficits, answers simple yes and no question. He denies pain/discomfort, refused physical exam. Review of facility psych note of visit [id number] indicates uncertainty if communication deficits due to language barrier vs neurocognitive impairment. Nursing with no complaint. History obtained from review of EMR, discussion with nursing staff, family and/or patient. Records reviewed and summarized above. All 10 point systems reviewed and are negative except as documented in history of present illness above  Review and summarization of Epic records shows history from other than patient.   Palliative Care was asked to follow this patient o help address complex decision making in the context of advance care planning and goals of care clarification.    PERTINENT MEDICATIONS:  Outpatient Encounter Medications as of 12/11/2022  Medication Sig   aspirin 81 MG chewable tablet Chew 1 tablet (81 mg total) by mouth daily.   atorvastatin (LIPITOR) 80 MG tablet Take 1 tablet (80 mg total) by mouth daily at 6 PM.   brimonidine (ALPHAGAN) 0.2 % ophthalmic solution Place 1 drop into the  left eye 2 (two) times daily.   citalopram (CELEXA) 10 MG tablet Take 1 tablet (10 mg total) by mouth daily.    clopidogrel (PLAVIX) 75 MG tablet Take 1 tablet (75 mg total) by mouth daily.   dorzolamide (TRUSOPT) 2 % ophthalmic solution Place 1 drop into the left eye 3 (three) times daily.   dorzolamidel-timolol (COSOPT) 22.3-6.8 MG/ML SOLN ophthalmic solution Place 1 drop into the left eye 2 (two) times daily.   HYDROcodone-acetaminophen (NORCO/VICODIN) 5-325 MG tablet Take 1 tablet by mouth every 6 (six) hours as needed.   hydrocortisone (ANUSOL-HC) 2.5 % rectal cream Apply rectally 2 times daily   latanoprost (XALATAN) 0.005 % ophthalmic solution Place 1 drop into the left eye at bedtime.   lisinopril (PRINIVIL,ZESTRIL) 2.5 MG tablet Take 1 tablet (2.5 mg total) by mouth daily.   methylphenidate (RITALIN) 10 MG tablet Take 1 tablet (10 mg total) by mouth 2 (two) times daily with breakfast and lunch.   pilocarpine (PILOCAR) 1 % ophthalmic solution Place 1 drop into the left eye 4 (four) times daily.   potassium chloride SA (K-DUR,KLOR-CON) 20 MEQ tablet Take 1 tablet (20 mEq total) by mouth daily.   No facility-administered encounter medications on file as of 12/11/2022.    HOSPICE ELIGIBILITY/DIAGNOSIS: TBD  PAST MEDICAL HISTORY:  Past Medical History:  Diagnosis Date   Hypertension    Stroke [facility])    "a long time ago -- 2 years ago"     ALLERGIES: No Known Allergies    I spent 35 minutes providing this consultation; this includes time spent with patient/family, chart review and documentation. More than 50% of the time in this consultation was spent on counseling and coordinating communication   Thank you for the opportunity to participate in the care of [name] Please call our office at [phone] if we can be of additional assistance.  Note: Portions of this note were generated with Lobbyist. Dictation errors may occur despite best attempts at proofreading.  Teodoro Spray, NP

## 2023-01-11 ENCOUNTER — Non-Acute Institutional Stay: Payer: Medicare Other | Admitting: Hospice

## 2023-01-11 DIAGNOSIS — F0153 Vascular dementia, unspecified severity, with mood disturbance: Secondary | ICD-10-CM

## 2023-01-11 DIAGNOSIS — Z515 Encounter for palliative care: Secondary | ICD-10-CM

## 2023-01-11 DIAGNOSIS — E43 Unspecified severe protein-calorie malnutrition: Secondary | ICD-10-CM

## 2023-01-11 DIAGNOSIS — F339 Major depressive disorder, recurrent, unspecified: Secondary | ICD-10-CM

## 2023-01-11 NOTE — Progress Notes (Signed)
[facility] Consult Note Telephone: [phone]  Fax: [phone]  PATIENT NAME: Jorge Burns DOB: 11-Mar-1941 MRN: [id number]  PRIMARY CARE PROVIDER:   Dr. Charlynne Pander   REFERRING PROVIDER:   Dr. Charlynne Pander   RESPONSIBLE PARTY:  [name]     Contact Information     Name Relation Home Work [place], [place] Granddaughter [phone]  [phone]   [name]   [phone]       Visit is to build trust and highlight Palliative Medicine as specialized medical care for people living with serious illness, aimed at facilitating better quality of life through symptoms relief, assisting with advance care planning and complex medical decision making. This is a follow up visit.   RECOMMENDATIONS/PLAN:   Advance Care Planning/Code Status: Patient is a Do Not Resuscitate  Goals of Care: Goals of care include to maximize quality of life and symptom management.  MOST form selections include comfort measures.    Palliative care team will continue to support patient, patient's family, and medical team.  Symptom management/Plan:  Comfort measures.   Patient appears content, in no distress.   Will continue to follow, monitor comfort, Weight, appetite, progression of chronic disease, symptoms of pain.  Currently asymptomatic.  Nursing staff updated, no new changes; medical goals plan of care and medications reviewed. Protein caloric malnutrition: Continue facility AHR frozen nutrition and treat, fortified foods, Ensure.  Provide assistance during meals to ensure adequate oral intake.  Current weight is 98.2 Ibs 10/23/22, 95.2 Ibs Jan '24,  89.6 Nov '23 Height 58 inches  Albumin 3.6 09/17/22.  ST consult as needed.  Weight weekly x 4.  Routine CBC CMP.     Dementia: Severe dementia - progressive memory loss/confusion, impoverished thought, bedbound, incontinent of bowel and bladder Currently FAST 7b, total care/max assist  in ADLs. Fall/safety precautions.  Out of bed daily.  Encourage socialization, promote calm approach and engaging environment. Use gentle persuasion, redirection and re-approach when patient refuses care.  FLACC 0. Follow-up with psych as planned.  Depression: Followed by Psych. Continue Mirtazapine as ordered. Encourage interactions and participation in facility activities.  Psych consult as scheduled.   Follow up: Palliative care will continue to follow for complex medical decision making, advance care planning, and clarification of goals. Return 6 weeks or prn. Encouraged to call provider sooner with any concerns.  CHIEF COMPLAINT: Palliative follow up  HISTORY OF PRESENT ILLNESS:  Jorge Burns a 82 y.o. male with multiple medical problems including protein caloric malnutrition, advanced vascular dementia, hemiplegia and hemiparesis following cerebral infarction affecting right dominant side, CVA in 2019, depression, anxiety, Dysphagia. Patient is a poor historian due to cognitive and language deficits, answers simple yes and no question. He denies pain/discomfort, as usual refused physical exam. FLACC 0.  Review of facility psych note of visit 08/22/21 indicates uncertainty if communication deficits due to language barrier vs neurocognitive impairment. Nursing with no complaint. History obtained from review of EMR, discussion with nursing staff, family and/or patient. Records reviewed and summarized above. All 10 point systems reviewed and are negative except as documented in history of present illness above  Review and summarization of Epic records shows history from other than patient.   Palliative Care was asked to follow this patient o help address complex decision making in the context of advance care planning and goals of care clarification.    PERTINENT MEDICATIONS:  Outpatient Encounter Medications as of 01/11/2023  Medication  Sig   aspirin 81 MG chewable tablet Chew 1 tablet (81 mg  total) by mouth daily.   atorvastatin (LIPITOR) 80 MG tablet Take 1 tablet (80 mg total) by mouth daily at 6 PM.   brimonidine (ALPHAGAN) 0.2 % ophthalmic solution Place 1 drop into the left eye 2 (two) times daily.   citalopram (CELEXA) 10 MG tablet Take 1 tablet (10 mg total) by mouth daily.   clopidogrel (PLAVIX) 75 MG tablet Take 1 tablet (75 mg total) by mouth daily.   dorzolamide (TRUSOPT) 2 % ophthalmic solution Place 1 drop into the left eye 3 (three) times daily.   dorzolamidel-timolol (COSOPT) 22.3-6.8 MG/ML SOLN ophthalmic solution Place 1 drop into the left eye 2 (two) times daily.   HYDROcodone-acetaminophen (NORCO/VICODIN) 5-325 MG tablet Take 1 tablet by mouth every 6 (six) hours as needed.   hydrocortisone (ANUSOL-HC) 2.5 % rectal cream Apply rectally 2 times daily   latanoprost (XALATAN) 0.005 % ophthalmic solution Place 1 drop into the left eye at bedtime.   lisinopril (PRINIVIL,ZESTRIL) 2.5 MG tablet Take 1 tablet (2.5 mg total) by mouth daily.   methylphenidate (RITALIN) 10 MG tablet Take 1 tablet (10 mg total) by mouth 2 (two) times daily with breakfast and lunch.   pilocarpine (PILOCAR) 1 % ophthalmic solution Place 1 drop into the left eye 4 (four) times daily.   potassium chloride SA (K-DUR,KLOR-CON) 20 MEQ tablet Take 1 tablet (20 mEq total) by mouth daily.   No facility-administered encounter medications on file as of 01/11/2023.    HOSPICE ELIGIBILITY/DIAGNOSIS: TBD  PAST MEDICAL HISTORY:  Past Medical History:  Diagnosis Date   Hypertension    Stroke [facility])    "a long time ago -- 2 years ago"     ALLERGIES: No Known Allergies    I spent 35 minutes providing this consultation; this includes time spent with patient/family, chart review and documentation. More than 50% of the time in this consultation was spent on counseling and coordinating communication   Thank you for the opportunity to participate in the care of [facility] [place] Please call our office at  [phone] if we can be of additional assistance.  Note: Portions of this note were generated with Scientist, clinical (histocompatibility and immunogenetics). Dictation errors may occur despite best attempts at proofreading.  Rosaura Carpenter, NP

## 2023-02-10 ENCOUNTER — Non-Acute Institutional Stay: Payer: Medicare Other | Admitting: Hospice

## 2023-02-10 DIAGNOSIS — Z515 Encounter for palliative care: Secondary | ICD-10-CM

## 2023-02-10 DIAGNOSIS — F0153 Vascular dementia, unspecified severity, with mood disturbance: Secondary | ICD-10-CM

## 2023-02-10 DIAGNOSIS — E43 Unspecified severe protein-calorie malnutrition: Secondary | ICD-10-CM

## 2023-02-10 DIAGNOSIS — F339 Major depressive disorder, recurrent, unspecified: Secondary | ICD-10-CM

## 2023-02-10 NOTE — Progress Notes (Signed)
Therapist, nutritional Palliative Care Consult Note Telephone: [phone]  Fax: [phone]  PATIENT NAME: Jorge Burns DOB: [birth year] MRN: [id number]  PRIMARY CARE PROVIDER:   Dr. Charlynne Pander   REFERRING PROVIDER:   Dr. Charlynne Pander   RESPONSIBLE PARTY:  [name]     Contact Information     Name Relation Home Work [place], [place] Granddaughter [phone]  [phone]   [name]   [phone]       Visit is to build trust and highlight Palliative Medicine as specialized medical care for people living with serious illness, aimed at facilitating better quality of life through symptoms relief, assisting with advance care planning and complex medical decision making. This is a follow up visit.   RECOMMENDATIONS/PLAN:   Advance Care Planning/Code Status: Patient is a Do Not Resuscitate  Goals of Care: Goals of care include to maximize quality of life and symptom management.  MOST form selections include comfort measures.    Palliative care team will continue to support patient, patient's family, and medical team.  Symptom management/Plan:  Comfort measures.  Patient sometimes resistance to care.  Use gentle persuasion, redirection and re-approach when patient refuses care.  Will continue to follow, monitor comfort, weight, appetite, progression of chronic disease, symptoms of pain.  Currently asymptomatic.  Nursing staff updated, no new changes; nursing reports he sometimes refuses care. Medical goals, plan of care, and medications reviewed. Protein caloric malnutrition: Continue facility AHR frozen nutrition and treat, fortified foods, Ensure.  Provide assistance during meals to ensure adequate oral intake.  Height 4 feet 10 inches, Current weight:  98.2 Ibs 10/23/22,  95.2 Ibs Jan '24,   89.6 Nov '23 Height 58 inches  Albumin 3.6 09/17/22.   ST consult as needed.  Monitor weight closely per facility protocol.  Routine CBC CMP.      Poor appetite: Mirtazapine 7.5 mg at bedtime to help boost appetite.  Offer assistance during meals to ensure adequate oral intake.  Dementia: Severe dementia -ongoing memory loss/confusion, impoverished thought, bedbound, incontinent of bowel and bladder Currently FAST 7b, total care/max assist in ADLs. Fall/safety precautions.  Out of bed daily.  Encourage socialization, promote calm approach and engaging environment. FLACC 0. Follow-up with psych as planned.  Depression: Followed by Psych. Continue Mirtazapine as ordered. Encourage interactions and participation in facility activities.  Psych consult as scheduled.   Follow up: Palliative care will continue to follow for complex medical decision making, advance care planning, and clarification of goals. Return 6 weeks or prn. Encouraged to call provider sooner with any concerns.  CHIEF COMPLAINT: Palliative follow up  HISTORY OF PRESENT ILLNESS:  Jorge Burns a 82 y.o. male with multiple medical problems including protein caloric malnutrition, advanced vascular dementia, hemiplegia and hemiparesis following cerebral infarction affecting right dominant side, CVA in 2019, depression, anxiety, Dysphagia. Patient is a poor historian due to cognitive and language deficits, answers simple yes and no question. He denies pain/discomfort, as usual refused physical exam. FLACC 0.  Review of facility psych note of visit 08/22/21 indicates uncertainty if communication deficits due to language barrier vs neurocognitive impairment. History obtained from review of EMR, discussion with nursing staff, family and/or patient. Records reviewed and summarized above. All 10 point systems reviewed and are negative except as documented in history of present illness above  Review and summarization of Epic records shows history from other than patient.   Palliative Care was asked to follow this patient o help  address complex decision making in the context of advance  care planning and goals of care clarification.    PERTINENT MEDICATIONS:  Outpatient Encounter Medications as of 02/10/2023  Medication Sig   aspirin 81 MG chewable tablet Chew 1 tablet (81 mg total) by mouth daily.   atorvastatin (LIPITOR) 80 MG tablet Take 1 tablet (80 mg total) by mouth daily at 6 PM.   brimonidine (ALPHAGAN) 0.2 % ophthalmic solution Place 1 drop into the left eye 2 (two) times daily.   citalopram (CELEXA) 10 MG tablet Take 1 tablet (10 mg total) by mouth daily.   clopidogrel (PLAVIX) 75 MG tablet Take 1 tablet (75 mg total) by mouth daily.   dorzolamide (TRUSOPT) 2 % ophthalmic solution Place 1 drop into the left eye 3 (three) times daily.   dorzolamidel-timolol (COSOPT) 22.3-6.8 MG/ML SOLN ophthalmic solution Place 1 drop into the left eye 2 (two) times daily.   HYDROcodone-acetaminophen (NORCO/VICODIN) 5-325 MG tablet Take 1 tablet by mouth every 6 (six) hours as needed.   hydrocortisone (ANUSOL-HC) 2.5 % rectal cream Apply rectally 2 times daily   latanoprost (XALATAN) 0.005 % ophthalmic solution Place 1 drop into the left eye at bedtime.   lisinopril (PRINIVIL,ZESTRIL) 2.5 MG tablet Take 1 tablet (2.5 mg total) by mouth daily.   methylphenidate (RITALIN) 10 MG tablet Take 1 tablet (10 mg total) by mouth 2 (two) times daily with breakfast and lunch.   pilocarpine (PILOCAR) 1 % ophthalmic solution Place 1 drop into the left eye 4 (four) times daily.   potassium chloride SA (K-DUR,KLOR-CON) 20 MEQ tablet Take 1 tablet (20 mEq total) by mouth daily.   No facility-administered encounter medications on file as of 02/10/2023.    HOSPICE ELIGIBILITY/DIAGNOSIS: TBD  PAST MEDICAL HISTORY:  Past Medical History:  Diagnosis Date   Hypertension    Stroke [facility])    "a long time ago -- 2 years ago"     ALLERGIES: No Known Allergies    I spent 35 minutes providing this consultation; this includes time spent with patient/family, chart review and documentation. More than 50%  of the time in this consultation was spent on counseling and coordinating communication   Thank you for the opportunity to participate in the care of [facility] Please call our office at [phone] if we can be of additional assistance.  Note: Portions of this note were generated with Scientist, clinical (histocompatibility and immunogenetics). Dictation errors may occur despite best attempts at proofreading.  Rosaura Carpenter, NP

## 2023-03-12 ENCOUNTER — Non-Acute Institutional Stay: Payer: Medicare Other | Admitting: Hospice

## 2023-03-12 DIAGNOSIS — Z515 Encounter for palliative care: Secondary | ICD-10-CM

## 2023-03-12 DIAGNOSIS — F0153 Vascular dementia, unspecified severity, with mood disturbance: Secondary | ICD-10-CM

## 2023-03-12 DIAGNOSIS — E43 Unspecified severe protein-calorie malnutrition: Secondary | ICD-10-CM

## 2023-03-12 NOTE — Progress Notes (Signed)
Therapist, nutritional Palliative Care Consult Note Telephone: [phone]  Fax: [phone]  PATIENT NAME: Jorge Burns DOB: 1941/08/13 MRN: [id number]  PRIMARY CARE PROVIDER:   Dr. Charlynne Pander   REFERRING PROVIDER:   Dr. Charlynne Pander   RESPONSIBLE PARTY:  [name]     Contact Information     Name Relation Home Work [place], [place] Granddaughter [phone]  [phone]   [name]   [phone]       Visit is to build trust and highlight Palliative Medicine as specialized medical care for people living with serious illness, aimed at facilitating better quality of life through symptoms relief, assisting with advance care planning and complex medical decision making. This is a follow up visit.   RECOMMENDATIONS/PLAN:   Advance Care Planning/Code Status: Patient is a Do Not Resuscitate  Goals of Care: Goals of care include to maximize quality of life and symptom management.  MOST form selections include comfort measures.    Palliative care team will continue to support patient, patient's family, and medical team.  Symptom management/Plan:  Patient sometimes resistant to care.  Use gentle persuasion, redirection and re-approach when patient refuses care.  Will continue to follow, monitor comfort, weight, appetite, progression of chronic disease, symptoms of pain.  Currently asymptomatic.  Nursing staff updated, no new changes; Medical goals, plan of care, and medications reviewed.  Protein caloric malnutrition: Monitor weight closely per facility protocol.  Routine CBC CMP.    Continue facility AHR frozen nutrition and treat, fortified foods, Ensure.  Provide assistance during meals to ensure adequate oral intake.  Height 4 feet 10 inches, Current weight:  98.2 Ibs 10/23/22,  95.2 Ibs Jan '24,   89.6 Nov '23 Height 58 inches  Albumin 3.6 09/17/22.   ST consult as needed.  Poor appetite: Mirtazapine 7.5 mg at bedtime to help  boost appetite.  Offer assistance during meals to ensure adequate oral intake. Check weight weekly x 4.   Dementia: Severe dementia -ongoing memory loss/confusion, impoverished thought, bedbound, incontinent of bowel and bladder Currently FAST 7b, total care/max assist in ADLs. Fall/safety precautions.  Out of bed daily.  Encourage socialization, promote calm approach and engaging environment. FLACC 0. Follow-up with psych as planned.  Follow up: Palliative care will continue to follow for complex medical decision making, advance care planning, and clarification of goals. Return 6 weeks or prn. Encouraged to call provider sooner with any concerns.  CHIEF COMPLAINT: Palliative follow up  HISTORY OF PRESENT ILLNESS:  Jorge Burns a 82 y.o. male with multiple medical problems including protein caloric malnutrition, advanced vascular dementia, hemiplegia and hemiparesis following cerebral infarction affecting right dominant side, CVA in 2019, depression, anxiety, Dysphagia. Patient is a poor historian due to cognitive and language deficits, answers simple yes and no question. He denies pain/discomfort, as usual refused physical exam. FLACC 0.  Review of facility psych note of visit 08/22/21 indicates uncertainty if communication deficits due to language barrier vs neurocognitive impairment. History obtained from review of EMR, discussion with nursing staff, family and/or patient. Records reviewed and summarized above. All 10 point systems reviewed and are negative except as documented in history of present illness above  Review and summarization of Epic records shows history from other than patient.   Palliative Care was asked to follow this patient o help address complex decision making in the context of advance care planning and goals of care clarification.    PERTINENT MEDICATIONS:  Outpatient Encounter Medications  as of 03/12/2023  Medication Sig   aspirin 81 MG chewable tablet Chew 1 tablet (81 mg  total) by mouth daily.   atorvastatin (LIPITOR) 80 MG tablet Take 1 tablet (80 mg total) by mouth daily at 6 PM.   brimonidine (ALPHAGAN) 0.2 % ophthalmic solution Place 1 drop into the left eye 2 (two) times daily.   citalopram (CELEXA) 10 MG tablet Take 1 tablet (10 mg total) by mouth daily.   clopidogrel (PLAVIX) 75 MG tablet Take 1 tablet (75 mg total) by mouth daily.   dorzolamide (TRUSOPT) 2 % ophthalmic solution Place 1 drop into the left eye 3 (three) times daily.   dorzolamidel-timolol (COSOPT) 22.3-6.8 MG/ML SOLN ophthalmic solution Place 1 drop into the left eye 2 (two) times daily.   HYDROcodone-acetaminophen (NORCO/VICODIN) 5-325 MG tablet Take 1 tablet by mouth every 6 (six) hours as needed.   hydrocortisone (ANUSOL-HC) 2.5 % rectal cream Apply rectally 2 times daily   latanoprost (XALATAN) 0.005 % ophthalmic solution Place 1 drop into the left eye at bedtime.   lisinopril (PRINIVIL,ZESTRIL) 2.5 MG tablet Take 1 tablet (2.5 mg total) by mouth daily.   methylphenidate (RITALIN) 10 MG tablet Take 1 tablet (10 mg total) by mouth 2 (two) times daily with breakfast and lunch.   pilocarpine (PILOCAR) 1 % ophthalmic solution Place 1 drop into the left eye 4 (four) times daily.   potassium chloride SA (K-DUR,KLOR-CON) 20 MEQ tablet Take 1 tablet (20 mEq total) by mouth daily.   No facility-administered encounter medications on file as of 03/12/2023.    HOSPICE ELIGIBILITY/DIAGNOSIS: TBD  PAST MEDICAL HISTORY:  Past Medical History:  Diagnosis Date   Hypertension    Stroke [facility])    "a long time ago -- 2 years ago"     ALLERGIES: No Known Allergies    I spent 35 minutes providing this consultation; this includes time spent with patient/family, chart review and documentation. More than 50% of the time in this consultation was spent on counseling and coordinating communication   Thank you for the opportunity to participate in the care of [name] Please call our office at  [phone] if we can be of additional assistance.  Note: Portions of this note were generated with Scientist, clinical (histocompatibility and immunogenetics). Dictation errors may occur despite best attempts at proofreading.  Rosaura Carpenter, NP

## 2023-07-01 ENCOUNTER — Inpatient Hospital Stay (HOSPITAL_COMMUNITY)
Admission: EM | Admit: 2023-07-01 | Discharge: 2023-07-05 | DRG: 641 | Disposition: A | Discharge status: Skilled Nursing Facility | Payer: Medicare Other | Source: Skilled Nursing Facility | Attending: Internal Medicine | Admitting: Internal Medicine

## 2023-07-01 ENCOUNTER — Emergency Department (HOSPITAL_COMMUNITY): Payer: Medicare Other

## 2023-07-01 ENCOUNTER — Other Ambulatory Visit: Payer: Self-pay

## 2023-07-01 ENCOUNTER — Encounter (HOSPITAL_COMMUNITY): Payer: Self-pay

## 2023-07-01 DIAGNOSIS — Z8719 Personal history of other diseases of the digestive system: Secondary | ICD-10-CM

## 2023-07-01 DIAGNOSIS — E872 Acidosis, unspecified: Principal | ICD-10-CM | POA: Diagnosis present

## 2023-07-01 DIAGNOSIS — K648 Other hemorrhoids: Secondary | ICD-10-CM | POA: Diagnosis present

## 2023-07-01 DIAGNOSIS — R64 Cachexia: Secondary | ICD-10-CM | POA: Diagnosis present

## 2023-07-01 DIAGNOSIS — L8990 Pressure ulcer of unspecified site, unspecified stage: Secondary | ICD-10-CM

## 2023-07-01 DIAGNOSIS — R319 Hematuria, unspecified: Secondary | ICD-10-CM | POA: Diagnosis present

## 2023-07-01 DIAGNOSIS — R7989 Other specified abnormal findings of blood chemistry: Secondary | ICD-10-CM | POA: Diagnosis present

## 2023-07-01 DIAGNOSIS — F32A Depression, unspecified: Secondary | ICD-10-CM | POA: Diagnosis present

## 2023-07-01 DIAGNOSIS — K6289 Other specified diseases of anus and rectum: Secondary | ICD-10-CM | POA: Diagnosis present

## 2023-07-01 DIAGNOSIS — I10 Essential (primary) hypertension: Secondary | ICD-10-CM | POA: Diagnosis present

## 2023-07-01 DIAGNOSIS — H547 Unspecified visual loss: Secondary | ICD-10-CM | POA: Diagnosis present

## 2023-07-01 DIAGNOSIS — Z7401 Bed confinement status: Secondary | ICD-10-CM

## 2023-07-01 DIAGNOSIS — D72829 Elevated white blood cell count, unspecified: Principal | ICD-10-CM | POA: Diagnosis present

## 2023-07-01 DIAGNOSIS — Z515 Encounter for palliative care: Secondary | ICD-10-CM

## 2023-07-01 DIAGNOSIS — T148XXA Other injury of unspecified body region, initial encounter: Secondary | ICD-10-CM | POA: Insufficient documentation

## 2023-07-01 DIAGNOSIS — Z7902 Long term (current) use of antithrombotics/antiplatelets: Secondary | ICD-10-CM

## 2023-07-01 DIAGNOSIS — I69351 Hemiplegia and hemiparesis following cerebral infarction affecting right dominant side: Secondary | ICD-10-CM

## 2023-07-01 DIAGNOSIS — F01C4 Vascular dementia, severe, with anxiety: Secondary | ICD-10-CM | POA: Diagnosis present

## 2023-07-01 DIAGNOSIS — Z603 Acculturation difficulty: Secondary | ICD-10-CM | POA: Diagnosis present

## 2023-07-01 DIAGNOSIS — H409 Unspecified glaucoma: Secondary | ICD-10-CM | POA: Diagnosis present

## 2023-07-01 DIAGNOSIS — E639 Nutritional deficiency, unspecified: Secondary | ICD-10-CM | POA: Diagnosis present

## 2023-07-01 DIAGNOSIS — Z7982 Long term (current) use of aspirin: Secondary | ICD-10-CM

## 2023-07-01 DIAGNOSIS — Z6821 Body mass index (BMI) 21.0-21.9, adult: Secondary | ICD-10-CM

## 2023-07-01 DIAGNOSIS — F01C3 Vascular dementia, severe, with mood disturbance: Secondary | ICD-10-CM | POA: Diagnosis present

## 2023-07-01 DIAGNOSIS — R911 Solitary pulmonary nodule: Secondary | ICD-10-CM | POA: Diagnosis present

## 2023-07-01 DIAGNOSIS — Z993 Dependence on wheelchair: Secondary | ICD-10-CM

## 2023-07-01 DIAGNOSIS — E785 Hyperlipidemia, unspecified: Secondary | ICD-10-CM | POA: Diagnosis present

## 2023-07-01 DIAGNOSIS — I69391 Dysphagia following cerebral infarction: Secondary | ICD-10-CM

## 2023-07-01 DIAGNOSIS — Z79899 Other long term (current) drug therapy: Secondary | ICD-10-CM

## 2023-07-01 DIAGNOSIS — Z66 Do not resuscitate: Secondary | ICD-10-CM | POA: Diagnosis present

## 2023-07-01 DIAGNOSIS — I69320 Aphasia following cerebral infarction: Secondary | ICD-10-CM

## 2023-07-01 DIAGNOSIS — L98492 Non-pressure chronic ulcer of skin of other sites with fat layer exposed: Secondary | ICD-10-CM | POA: Diagnosis present

## 2023-07-01 DIAGNOSIS — R4182 Altered mental status, unspecified: Secondary | ICD-10-CM | POA: Diagnosis present

## 2023-07-01 DIAGNOSIS — F01C2 Vascular dementia, severe, with psychotic disturbance: Secondary | ICD-10-CM | POA: Diagnosis present

## 2023-07-01 DIAGNOSIS — Z1152 Encounter for screening for COVID-19: Secondary | ICD-10-CM

## 2023-07-01 HISTORY — DX: Muscle weakness (generalized): M62.81

## 2023-07-01 HISTORY — DX: Anemia, unspecified: D64.9

## 2023-07-01 HISTORY — DX: Unspecified dementia, unspecified severity, without behavioral disturbance, psychotic disturbance, mood disturbance, and anxiety: F03.90

## 2023-07-01 HISTORY — DX: Hyperlipidemia, unspecified: E78.5

## 2023-07-01 HISTORY — DX: Anxiety disorder, unspecified: F41.9

## 2023-07-01 HISTORY — DX: Dysphagia, unspecified: R13.10

## 2023-07-01 LAB — I-STAT CG4 LACTIC ACID, ED: Lactic Acid, Venous: 2.8 mmol/L (ref 0.5–1.9)

## 2023-07-01 LAB — CBC WITH DIFFERENTIAL/PLATELET
Abs Immature Granulocytes: 0.14 10*3/uL — ABNORMAL HIGH (ref 0.00–0.07)
Basophils Absolute: 0 10*3/uL (ref 0.0–0.1)
Basophils Relative: 0 %
Eosinophils Absolute: 0 10*3/uL (ref 0.0–0.5)
Eosinophils Relative: 0 %
HCT: 46.1 % (ref 39.0–52.0)
Hemoglobin: 14.6 g/dL (ref 13.0–17.0)
Immature Granulocytes: 1 %
Lymphocytes Relative: 4 %
Lymphs Abs: 0.9 10*3/uL (ref 0.7–4.0)
MCH: 25.9 pg — ABNORMAL LOW (ref 26.0–34.0)
MCHC: 31.7 g/dL (ref 30.0–36.0)
MCV: 81.7 fL (ref 80.0–100.0)
Monocytes Absolute: 0.7 10*3/uL (ref 0.1–1.0)
Monocytes Relative: 3 %
Neutro Abs: 19.9 10*3/uL — ABNORMAL HIGH (ref 1.7–7.7)
Neutrophils Relative %: 92 %
Platelets: 326 10*3/uL (ref 150–400)
RBC: 5.64 MIL/uL (ref 4.22–5.81)
RDW: 14.5 % (ref 11.5–15.5)
WBC: 21.7 10*3/uL — ABNORMAL HIGH (ref 4.0–10.5)
nRBC: 0 % (ref 0.0–0.2)

## 2023-07-01 LAB — PROCALCITONIN: Procalcitonin: <0.1 ng/mL

## 2023-07-01 LAB — COMPREHENSIVE METABOLIC PANEL
ALT: 31 U/L (ref 0–44)
AST: 33 U/L (ref 15–41)
Albumin: 3 g/dL — ABNORMAL LOW (ref 3.5–5.0)
Alkaline Phosphatase: 105 U/L (ref 38–126)
Anion gap: 12 (ref 5–15)
BUN: 21 mg/dL (ref 8–23)
CO2: 24 mmol/L (ref 22–32)
Calcium: 9.4 mg/dL (ref 8.9–10.3)
Chloride: 108 mmol/L (ref 98–111)
Creatinine, Ser: 1.39 mg/dL — ABNORMAL HIGH (ref 0.61–1.24)
GFR, Estimated: 51 mL/min — ABNORMAL LOW (ref >60)
Glucose, Bld: 161 mg/dL — ABNORMAL HIGH (ref 70–99)
Potassium: 4.3 mmol/L (ref 3.5–5.1)
Sodium: 144 mmol/L (ref 135–145)
Total Bilirubin: 0.8 mg/dL (ref 0.3–1.2)
Total Protein: 8.1 g/dL (ref 6.5–8.1)

## 2023-07-01 LAB — RESP PANEL BY RT-PCR (RSV, FLU A&B, COVID)  RVPGX2
Influenza A by PCR: NEGATIVE
Influenza B by PCR: NEGATIVE
Resp Syncytial Virus by PCR: NEGATIVE
SARS Coronavirus 2 by RT PCR: NEGATIVE

## 2023-07-01 LAB — LACTIC ACID, PLASMA: Lactic Acid, Venous: 2.1 mmol/L (ref 0.5–1.9)

## 2023-07-01 MED ORDER — ACETAMINOPHEN 325 MG PO TABS: 650.0000 mg | ORAL_TABLET | Freq: Four times a day (QID) | ORAL | Status: DC | PRN

## 2023-07-01 MED ORDER — VANCOMYCIN HCL 1250 MG/250ML IV SOLN
1250.0000 mg | Freq: Once | INTRAVENOUS | Status: AC
  Administered 2023-07-01: 1250 mg via INTRAVENOUS
  Filled 2023-07-01: qty 250

## 2023-07-01 MED ORDER — MAGNESIUM HYDROXIDE 400 MG/5ML PO SUSP: 30.0000 mL | Freq: Every evening | ORAL | Status: DC

## 2023-07-01 MED ORDER — LATANOPROST 0.005 % OP SOLN
1.0000 [drp] | Freq: Every day | OPHTHALMIC | Status: DC
  Administered 2023-07-01: 1 [drp] via OPHTHALMIC
  Filled 2023-07-01: qty 2.5

## 2023-07-01 MED ORDER — MIRTAZAPINE 15 MG PO TABS
7.5000 mg | ORAL_TABLET | Freq: Every day | ORAL | Status: DC
  Administered 2023-07-01 – 2023-07-04 (×3): 7.5 mg via ORAL
  Filled 2023-07-01 (×4): qty 1

## 2023-07-01 MED ORDER — ASPIRIN 81 MG PO TBEC
81.0000 mg | DELAYED_RELEASE_TABLET | Freq: Every day | ORAL | Status: DC
  Administered 2023-07-04: 81 mg via ORAL
  Filled 2023-07-01 (×3): qty 1

## 2023-07-01 MED ORDER — PILOCARPINE HCL 1 % OP SOLN
1.0000 [drp] | Freq: Four times a day (QID) | OPHTHALMIC | Status: DC
  Administered 2023-07-01 – 2023-07-05 (×3): 1 [drp] via OPHTHALMIC
  Filled 2023-07-01: qty 15

## 2023-07-01 MED ORDER — ATORVASTATIN CALCIUM 40 MG PO TABS
40.0000 mg | ORAL_TABLET | Freq: Every day | ORAL | Status: DC
  Administered 2023-07-01 – 2023-07-04 (×3): 40 mg via ORAL
  Filled 2023-07-01 (×4): qty 1

## 2023-07-01 MED ORDER — LACTATED RINGERS IV BOLUS
1000.0000 mL | Freq: Once | INTRAVENOUS | Status: AC
  Administered 2023-07-01: 1000 mL via INTRAVENOUS

## 2023-07-01 MED ORDER — ENSURE ENLIVE PO LIQD
237.0000 mL | Freq: Three times a day (TID) | ORAL | Status: DC
  Administered 2023-07-01 – 2023-07-05 (×6): 237 mL via ORAL

## 2023-07-01 MED ORDER — ACETAMINOPHEN 500 MG PO TABS
1000.0000 mg | ORAL_TABLET | Freq: Two times a day (BID) | ORAL | Status: DC
  Administered 2023-07-01 – 2023-07-04 (×4): 1000 mg via ORAL
  Filled 2023-07-01 (×7): qty 2

## 2023-07-01 MED ORDER — BRIMONIDINE TARTRATE 0.2 % OP SOLN
1.0000 [drp] | Freq: Two times a day (BID) | OPHTHALMIC | Status: DC
  Administered 2023-07-01 – 2023-07-05 (×2): 1 [drp] via OPHTHALMIC
  Filled 2023-07-01: qty 5

## 2023-07-01 MED ORDER — ACETAMINOPHEN 650 MG RE SUPP: 650.0000 mg | Freq: Four times a day (QID) | RECTAL | Status: DC | PRN

## 2023-07-01 MED ORDER — SENNOSIDES-DOCUSATE SODIUM 8.6-50 MG PO TABS
1.0000 | ORAL_TABLET | Freq: Every evening | ORAL | Status: DC | PRN
  Administered 2023-07-03: 1 via ORAL
  Filled 2023-07-01: qty 1

## 2023-07-01 MED ORDER — MEMANTINE HCL 10 MG PO TABS
5.0000 mg | ORAL_TABLET | Freq: Every day | ORAL | Status: DC
  Administered 2023-07-01 – 2023-07-04 (×4): 5 mg via ORAL
  Filled 2023-07-01 (×4): qty 1

## 2023-07-01 MED ORDER — DORZOLAMIDE HCL-TIMOLOL MAL 2-0.5 % OP SOLN
1.0000 [drp] | Freq: Two times a day (BID) | OPHTHALMIC | Status: DC
  Administered 2023-07-01 – 2023-07-05 (×2): 1 [drp] via OPHTHALMIC
  Filled 2023-07-01: qty 10

## 2023-07-01 MED ORDER — ENOXAPARIN SODIUM 40 MG/0.4ML IJ SOSY
40.0000 mg | PREFILLED_SYRINGE | INTRAMUSCULAR | Status: DC
  Administered 2023-07-01 – 2023-07-05 (×3): 40 mg via SUBCUTANEOUS
  Filled 2023-07-01 (×3): qty 0.4

## 2023-07-01 MED ORDER — SODIUM CHLORIDE 0.9 % IV SOLN
2.0000 g | Freq: Once | INTRAVENOUS | Status: AC
  Administered 2023-07-02: 2 g via INTRAVENOUS
  Filled 2023-07-01: qty 12.5

## 2023-07-01 MED ORDER — SODIUM CHLORIDE 0.9 % IV SOLN
2.0000 g | Freq: Once | INTRAVENOUS | Status: AC
  Administered 2023-07-01: 2 g via INTRAVENOUS
  Filled 2023-07-01: qty 12.5

## 2023-07-01 NOTE — ED Notes (Signed)
Bed alarm not available

## 2023-07-01 NOTE — H&P (Addendum)
Date: 07/01/2023               Patient Name:  Jorge Burns MRN: [id number]  DOB: Sep 14, 1941 Age / Sex: 82 y.o., male   PCP: Patient, No Pcp Per         Medical Service: Internal Medicine Teaching Service         Attending Physician: Dr. Reymundo Poll, MD      First Contact: Dr. Annett Fabian, MD Pager [phone]    Second Contact: Dr. Rana Snare, DO Pager [phone]         After Hours (After 5p/  First Contact Pager: [phone]  weekends / holidays): Second Contact Pager: [phone]   SUBJECTIVE   Chief Complaint: fever, not acting himself  History of Present Illness:   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 Per his nurse at the facility, she noted that he had an elevated blood pressure 170/68 and had a fever of ~101. He was given 1000 mg tylenol, which broke the fever. She also noted that his baseline is usually agitated and aggressive, and he was acting calm, which is "not like himself," so they transported him to the ED. She notes that he usually refuses medications, so they normally crush the medications and put it in an ensure, which he drinks.   Called Granddaughter Jorge Burns (not biologically related), who he used to live with and who is his Clinical research associate, for collateral information. She says she last saw him in March. She states his baseline is completely aphasic following his stroke. He is usually alert and smiles but is not conversational. She notes some agitation and aggression toward staff in the past, which she believes is his paranoia that they are out to get him. She was unable to care for him at home, so she  had to admit him to a SNF, where he has been for 5 years. He used to be able to be transported in a wheelchair, but since 2020-2021, he has been completely bedbound.  Jorge Burns confirms patient is DNR/DNI and she has been helping with medical decisions. No other family/relatives around. Document is in the chart. She states that she would not consent to any surgical interventions or intubations, but is okay with supportive treatments, medications, and antibiotics.   ED Course: On presentation, he was afebrile, but he had received Tylenol prior to his arrival.  He had leukocytosis of 21,000 concerning for infection.  Chest x-ray did not show any acute findings but redemonstrated a cavitary lesion in the left upper lobe.  He had a lactic acid of 2.8.  He was given IV fluids for concerns of sepsis. Received vancomycin and cefepime x 1.   Meds: confirmed with MAR faxed by Jorge Burns  -ASA 81 mg daily -Atorvastatin 40 mg daily -Clonidine patch 0.1 mg weekly -Latanoprost 0.005%, instill 1 drop in left eye at bedtime -Brimonidine tartrate 0.2%, 1 drop in left eye BID for glaucoma -Pilocarpine HCl 1%, 1 drop into both eyes QID for glaucoma -Dorzolamide-Timolol 22.3-6.8 mg 1 drop in left eye BID for glaucoma -Memantine 5 mg nightly -Milk of magnesia prn -Mirtazapine 7.5 mg nightly -plavix  75 mg daily -Vitamin D 125 mcg daily -Tylenol 1000 mg BID for arthritis  Current Meds  Medication Sig   acetaminophen (TYLENOL) 500 MG tablet Take 1,000 mg by mouth 2 (two) times daily.   ASPIRIN 81 PO Take 81 mg by mouth daily.   atorvastatin (LIPITOR) 40 MG tablet Take 40 mg by mouth at bedtime.   brimonidine (ALPHAGAN) 0.2 % ophthalmic solution Place 1 drop into the left eye 2 (two) times daily.   Cholecalciferol (VITAMIN D3) 125 MCG (5000 UT) CAPS Take 5,000 Units by mouth daily.   cloNIDine (CATAPRES - DOSED IN MG/24 HR) 0.1 mg/24hr patch Place 0.1 mg onto the skin See admin instructions. Apply 1 patch to  skin every 7 days on Tuesdays.   dorzolamidel-timolol (COSOPT) 22.3-6.8 MG/ML SOLN ophthalmic solution Place 1 drop into the left eye 2 (two) times daily.   latanoprost (XALATAN) 0.005 % ophthalmic solution Place 1 drop into the left eye at bedtime.   magnesium hydroxide (MILK OF MAGNESIA) 400 MG/5ML suspension Take 30 mLs by mouth every evening.   memantine (NAMENDA) 5 MG tablet Take 5 mg by mouth at bedtime.   mirtazapine (REMERON) 7.5 MG tablet Take 7.5 mg by mouth at bedtime.   Nutritional Supplements (ENSURE PO) Take 237 mLs by mouth daily.   NUTRITIONAL SUPPLEMENTS PO Take 1 each by mouth 2 (two) times daily. Frozen nutritional treat   pilocarpine (PILOCAR) 1 % ophthalmic solution Place 1 drop into the left eye 4 (four) times daily. (Patient taking differently: Place 1 drop into both eyes 4 (four) times daily.)   Past Medical History Past Medical History:  Diagnosis Date   Anemia    Anxiety    Dementia (HCC)    Dysphagia    Hyperlipidemia    Hypertension    Muscle weakness    Stroke (HCC)    "a long time ago -- 2 years ago"   Past Surgical History:  Procedure Laterality Date   IR ANGIO INTRA EXTRACRAN SEL COM CAROTID INNOMINATE BILAT MOD SED  02/15/2018   IR ANGIO VERTEBRAL SEL SUBCLAVIAN INNOMINATE UNI R MOD SED  02/15/2018   IR ANGIO VERTEBRAL SEL VERTEBRAL UNI L MOD SED  02/15/2018   Social:  Lives: in facility Psychologist, prison and probation services) Occupation: retired  Support: Jorge Burns  Level of Function: dependent on ADLs PCP: Jorge Burns [employer]) Substances: denies tobacco, alcohol, recreational drug use   Family History: No known family history  Allergies: Allergies as of 07/01/2023   (No Known Allergies)   Review of Systems: A complete ROS was negative except as per HPI.   OBJECTIVE:   Physical Exam: Blood pressure (!) 157/81, pulse 90, temperature 98.1 F (36.7 C), resp. rate 14, height 5\' 6"  (1.676 m), weight 60 kg, SpO2 98%.  Constitutional: laying in bed, eyes  open, agitated and resistant to exam. Exam limited by patient resistance.  HENT: normocephalic atraumatic, mucous membranes moist Eyes: conjunctiva non-erythematous, corneal changes in left eye Cardiovascular: regular rate and rhythm, no m/r/g, no LE edema, exam limited Pulmonary/Chest: normal rate and work of breathing Abdominal: non-distended MSK: chronic atrophy, unable to assess fully Neurological: unable to assess orientation due to aphasia; hemiplegia on the right, left arm moves spontaneously, making a fist, pulling up covers, biting bedsheet Skin: warm and dry, nails dystrophic Psych: agitated  Labs: CBC    Component Value Date/Time   WBC 21.7 (H) 07/01/2023 0943   RBC 5.64 07/01/2023 0943   HGB 14.6 07/01/2023 0943   HCT  46.1 07/01/2023 0943   PLT 326 07/01/2023 0943   MCV 81.7 07/01/2023 0943   MCH 25.9 (L) 07/01/2023 0943   MCHC 31.7 07/01/2023 0943   RDW 14.5 07/01/2023 0943   LYMPHSABS 0.9 07/01/2023 0943   MONOABS 0.7 07/01/2023 0943   EOSABS 0.0 07/01/2023 0943   BASOSABS 0.0 07/01/2023 0943    CMP     Component Value Date/Time   NA 144 07/01/2023 0943   K 4.3 07/01/2023 0943   CL 108 07/01/2023 0943   CO2 24 07/01/2023 0943   GLUCOSE 161 (H) 07/01/2023 0943   BUN 21 07/01/2023 0943   CREATININE 1.39 (H) 07/01/2023 0943   CALCIUM 9.4 07/01/2023 0943   PROT 8.1 07/01/2023 0943   ALBUMIN 3.0 (L) 07/01/2023 0943   AST 33 07/01/2023 0943   ALT 31 07/01/2023 0943   ALKPHOS 105 07/01/2023 0943   BILITOT 0.8 07/01/2023 0943   GFRNONAA 51 (L) 07/01/2023 0943   GFRAA >60 08/08/2018 1431   Imaging: CT Head Wo Contrast CLINICAL DATA:  Mental status change, unknown cause  EXAM: CT HEAD WITHOUT CONTRAST  TECHNIQUE: Contiguous axial images were obtained from the base of the skull through the vertex without intravenous contrast.  RADIATION DOSE REDUCTION: This exam was performed according to the departmental dose-optimization program which includes  automated exposure control, adjustment of the mA and/or kV according to patient size and/or use of iterative reconstruction technique.  COMPARISON:  Head CT 09/17/2022  FINDINGS: Brain: Motion artifact, despite repeat acquisitions. The areas of hemorrhage on prior have resolved, although some areas are obscured by motion. 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 There is associated ex vacuole dilatation of the left lateral ventricle. Right frontal encephalomalacia has progressed from prior. Right temporal encephalomalacia is grossly stable. No evidence of acute hemorrhage allowing for motion artifact limitations. No gross subdural hematoma, although more detailed assessment is limited due to motion.  Vascular: Grossly negative.  Skull: Grossly negative, allowing for motion.  Sinuses/Orbits: No acute findings.  Other: None.  IMPRESSION: 1. Motion artifact, despite repeat acquisitions. 2. 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 Right frontal encephalomalacia has progressed from prior. Right temporal encephalomalacia is grossly stable. 3. No evidence of acute intracranial abnormality allowing for motion artifact.  Electronically Signed   By: Narda Rutherford M.D.   On: 07/01/2023 15:30 DG Chest Portable 1 View CLINICAL DATA:  Fever and altered mental status  EXAM: PORTABLE CHEST 1 VIEW  COMPARISON:  Chest radiograph dated 09/09/2022, CT chest dated 02/16/2018  FINDINGS: Normal lung volumes. Similar left lung scarring and right basilar linear opacities. 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 No pleural effusion or pneumothorax. The heart size and mediastinal contours are within normal limits. Similar dense left thyroid calcification. No acute osseous abnormality.  IMPRESSION: 1. Similar left lung scarring and right basilar linear opacities, likely atelectasis. 2. 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  Electronically Signed   By: Agustin Cree M.D.   On: 07/01/2023 11:29    ASSESSMENT & PLAN:   Assessment & Plan by Problem: Principal Problem:   Leukocytosis  Kalon Fackler is a 82 y.o. person living with a history of dementia, hemiplegia and hemiparesis s/p severe stroke in 2019, anxiety and depression, and dysphagia, who was brought from [facility] for concerns of behavioral changes, elevated blood pressure, and fever and is admitted for sepsis on hospital day 0  Altered mental status Leukocytosis  Lactic acidosis Elevated Creatinine Patient febrile at his facility but has been afebrile on presentation to the emergency department.  Leukocytosis of 21.7, lactic acid 2.8.  Creatinine 1.39, unclear baseline.  Electrolytes otherwise unremarkable.  No clear signs of infection on chest x-ray.  CT head limited by motion but no acute abnormalities were noted. Respiratory viral panel negative. Labs suspicious for infection, but unclear etiology so far. We will do a few more tests to rule out any active infection; if the workup is unrevealing and he remains afebrile and medically stable, he can be discharged back to [facility].  -Procalcitonin -Urinalysis -Follow up blood cultures -Trend Lactic acid -Delirium precautions -Monitor temperature -CBC, BMP in AM  Chronic conditions: Glaucoma- continued Latanoprost 0.005%, instill 1 drop in left eye at bedtime; Brimonidine tartrate 0.2%, 1 drop in left eye BID for glaucoma; Pilocarpine HCl 1%, 1 drop into both eyes QID for glaucoma; Dorzolamide-Timolol 22.3-6.8 mg 1 drop in left eye BID for glaucoma. Depression/anxiety- continued Mirtazapine 7.5 mg nightly Dementia- continued Memantine 5 mg nightly Hx of CVA, hyperlipidemia- continued plavix 75 mg daily, ASA 81 mg daily, atorvastatin 40 mg daily Hypertension- Clonidine patch weekly, held  Diet: Dys 3 VTE: Enoxaparin IVF:  None Code: DNR  Prior to Admission Living Arrangement: SNF, [facility] Anticipated Discharge Location: SNF Barriers to Discharge: medical workup and treatment  Dispo: Admit patient to Observation with expected length of stay less than 2 midnights.  Signed: Annett Fabian, MD Internal Medicine Resident, PGY-1 [facility]  Pager: [phone]  07/01/2023, 5:23 PM

## 2023-07-01 NOTE — ED Triage Notes (Signed)
Pt present to ED from [facility] d/t fever. Pt disoriented, nonverbal at this time, with hx of behavioral disturbance. Pt combative during perineal care at this time. Pt noted with fecal impaction, large hard stool noted.

## 2023-07-01 NOTE — ED Notes (Signed)
Unable to straight cath pt times two. Pt combative, trying to bite staff, trashing body around in bed. Blood noted coming from penis, straight cath discontinued at this time. Perineal care provided per staff, pt combative during perineal care.

## 2023-07-01 NOTE — ED Notes (Signed)
ED TO INPATIENT HANDOFF REPORT  ED Nurse Name and Phone #:  Theophilus Bones [phone]  S Name/Age/Gender Jorge Burns 82 y.o. male Room/Bed: 035C/035C  Code Status   Code Status: Limited: Do not attempt resuscitation (DNR) -DNR-LIMITED -Do Not Intubate/DNI   Home/SNF/Other Skilled nursing facility Patient oriented to: self Is this baseline?  Pt disoriented times three, pt nonverbal at this time  Triage Complete: Triage complete  Chief Complaint Leukocytosis [D72.829]  Triage Note Pt present to ED from [facility] d/t fever. Pt disoriented, nonverbal at this time, with hx of behavioral disturbance. Pt combative during perineal care at this time. Pt noted with fecal impaction, large hard stool noted.   Allergies No Known Allergies  Level of Care/Admitting Diagnosis ED Disposition     ED Disposition  Admit   Condition  --   Comment  Hospital Area: [facility] [[id number]]  Level of Care: Med-Surg [16]  May place patient in observation at [facility] or [facility] if equivalent level of care is available:: No  Covid Evaluation: Confirmed COVID Negative  Diagnosis: Leukocytosis [200030]  Admitting Physician: Reymundo Poll [[id number]]  Attending Physician: Arnetha Courser          B Medical/Surgery History Past Medical History:  Diagnosis Date   Anemia    Anxiety    Dementia (HCC)    Dysphagia    Hyperlipidemia    Hypertension    Muscle weakness    Stroke (HCC)    "a long time ago -- 2 years ago"   Past Surgical History:  Procedure Laterality Date   IR ANGIO INTRA EXTRACRAN SEL COM CAROTID INNOMINATE BILAT MOD SED  02/15/2018   IR ANGIO VERTEBRAL SEL SUBCLAVIAN INNOMINATE UNI R MOD SED  02/15/2018   IR ANGIO VERTEBRAL SEL VERTEBRAL UNI L MOD SED  02/15/2018     A IV Location/Drains/Wounds Patient Lines/Drains/Airways Status     Active Line/Drains/Airways     Name Placement date Placement time Site Days   Peripheral IV 09/17/22 20 G  Right 09/17/22  0630  --  287            Intake/Output Last 24 hours  Intake/Output Summary (Last 24 hours) at 07/01/2023 1605 Last data filed at 07/01/2023 1545 Gross per 24 hour  Intake 350 ml  Output --  Net 350 ml    Labs/Imaging Results for orders placed or performed during the hospital encounter of 07/01/23 (from the past 48 hour(s))  CBC with Differential     Status: Abnormal   Collection Time: 07/01/23  9:43 AM  Result Value Ref Range   WBC 21.7 (H) 4.0 - 10.5 K/uL   RBC 5.64 4.22 - 5.81 MIL/uL   Hemoglobin 14.6 13.0 - 17.0 g/dL   HCT 78.4 69.6 - 29.5 %   MCV 81.7 80.0 - 100.0 fL   MCH 25.9 (L) 26.0 - 34.0 pg   MCHC 31.7 30.0 - 36.0 g/dL   RDW 28.4 13.2 - 44.0 %   Platelets 326 150 - 400 K/uL   nRBC 0.0 0.0 - 0.2 %   Neutrophils Relative % 92 %   Neutro Abs 19.9 (H) 1.7 - 7.7 K/uL   Lymphocytes Relative 4 %   Lymphs Abs 0.9 0.7 - 4.0 K/uL   Monocytes Relative 3 %   Monocytes Absolute 0.7 0.1 - 1.0 K/uL   Eosinophils Relative 0 %   Eosinophils Absolute 0.0 0.0 - 0.5 K/uL   Basophils Relative 0 %   Basophils Absolute  0.0 0.0 - 0.1 K/uL   Immature Granulocytes 1 %   Abs Immature Granulocytes 0.14 (H) 0.00 - 0.07 K/uL    Comment: Performed at [facility], [address], [place], [place] [address]  Comprehensive metabolic panel     Status: Abnormal   Collection Time: 07/01/23  9:43 AM  Result Value Ref Range   Sodium 144 135 - 145 mmol/L   Potassium 4.3 3.5 - 5.1 mmol/L    Comment: HEMOLYSIS AT THIS LEVEL MAY AFFECT RESULT   Chloride 108 98 - 111 mmol/L   CO2 24 22 - 32 mmol/L   Glucose, Bld 161 (H) 70 - 99 mg/dL    Comment: Glucose reference range applies only to samples taken after fasting for at least 8 hours.   BUN 21 8 - 23 mg/dL   Creatinine, Ser 0.98 (H) 0.61 - 1.24 mg/dL   Calcium 9.4 8.9 - 11.9 mg/dL   Total Protein 8.1 6.5 - 8.1 g/dL   Albumin 3.0 (L) 3.5 - 5.0 g/dL   AST 33 15 - 41 U/L    Comment: HEMOLYSIS AT THIS LEVEL MAY AFFECT  RESULT   ALT 31 0 - 44 U/L    Comment: HEMOLYSIS AT THIS LEVEL MAY AFFECT RESULT   Alkaline Phosphatase 105 38 - 126 U/L   Total Bilirubin 0.8 0.3 - 1.2 mg/dL    Comment: HEMOLYSIS AT THIS LEVEL MAY AFFECT RESULT   GFR, Estimated 51 (L) >60 mL/min    Comment: (NOTE) Calculated using the CKD-EPI Creatinine Equation (2021)    Anion gap 12 5 - 15    Comment: Performed at [facility], [address], [place], [place] [address]  Resp panel by RT-PCR (RSV, Flu A&B, Covid) Anterior Nasal Swab     Status: None   Collection Time: 07/01/23  9:43 AM   Specimen: Anterior Nasal Swab  Result Value Ref Range   SARS Coronavirus 2 by RT PCR NEGATIVE NEGATIVE   Influenza A by PCR NEGATIVE NEGATIVE   Influenza B by PCR NEGATIVE NEGATIVE    Comment: (NOTE) The Xpert Xpress SARS-CoV-2/FLU/RSV plus assay is intended as an aid in the diagnosis of influenza from Nasopharyngeal swab specimens and should not be used as a sole basis for treatment. Nasal washings and aspirates are unacceptable for Xpert Xpress SARS-CoV-2/FLU/RSV testing.  Fact Sheet for Patients: BloggerCourse.com  Fact Sheet for Healthcare Providers: SeriousBroker.it  This test is not yet approved or cleared by the [place] FDA and has been authorized for detection and/or diagnosis of SARS-CoV-2 by FDA under an Emergency Use Authorization (EUA). This EUA will remain in effect (meaning this test can be used) for the duration of the COVID-19 declaration under Section 564(b)(1) of the Act, 21 U.S.C. section 360bbb-3(b)(1), unless the authorization is terminated or revoked.     Resp Syncytial Virus by PCR NEGATIVE NEGATIVE    Comment: (NOTE) Fact Sheet for Patients: BloggerCourse.com  Fact Sheet for Healthcare Providers: SeriousBroker.it  This test is not yet approved or cleared by the [place] FDA and has been  authorized for detection and/or diagnosis of SARS-CoV-2 by FDA under an Emergency Use Authorization (EUA). This EUA will remain in effect (meaning this test can be used) for the duration of the COVID-19 declaration under Section 564(b)(1) of the Act, 21 U.S.C. section 360bbb-3(b)(1), unless the authorization is terminated or revoked.  Performed at [facility], [address] [place], [place] [address]   Blood culture (routine x  2)     Status: None (Preliminary result)   Collection Time: 07/01/23 11:37 AM   Specimen: BLOOD RIGHT ARM  Result Value Ref Range   Specimen Description BLOOD RIGHT ARM    Special Requests      BOTTLES DRAWN AEROBIC AND ANAEROBIC Blood Culture results may not be optimal due to an inadequate volume of blood received in culture bottles   Culture      NO GROWTH < 12 HOURS Performed at [facility], [address] [place], [place] [address]    Report Status PENDING   Blood culture (routine x 2)     Status: None (Preliminary result)   Collection Time: 07/01/23 11:42 AM   Specimen: BLOOD  Result Value Ref Range   Specimen Description BLOOD RIGHT ANTECUBITAL    Special Requests      BOTTLES DRAWN AEROBIC AND ANAEROBIC Blood Culture results may not be optimal due to an inadequate volume of blood received in culture bottles   Culture      NO GROWTH < 12 HOURS Performed at [facility], [address], [place], [place] [address]    Report Status PENDING   I-Stat CG4 Lactic Acid     Status: Abnormal   Collection Time: 07/01/23 12:30 PM  Result Value Ref Range   Lactic Acid, Venous 2.8 (HH) 0.5 - 1.9 mmol/L   Comment NOTIFIED PHYSICIAN    CT Head Wo Contrast  Result Date: 07/01/2023 CLINICAL DATA:  Mental status change, unknown cause EXAM: CT HEAD WITHOUT CONTRAST TECHNIQUE: Contiguous axial images were obtained from the base of the skull through the vertex without intravenous contrast. RADIATION DOSE REDUCTION: This exam was performed  according to the departmental dose-optimization program which includes automated exposure control, adjustment of the mA and/or kV according to patient size and/or use of iterative reconstruction technique. COMPARISON:  Head CT 09/17/2022 FINDINGS: Brain: Motion artifact, despite repeat acquisitions. The areas of hemorrhage on prior have resolved, although some areas are obscured by motion. 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 There is associated ex vacuole dilatation of the left lateral ventricle. Right frontal encephalomalacia has progressed from prior. Right temporal encephalomalacia is grossly stable. No evidence of acute hemorrhage allowing for motion artifact limitations. No gross subdural hematoma, although more detailed assessment is limited due to motion. Vascular: Grossly negative. Skull: Grossly negative, allowing for motion. Sinuses/Orbits: No acute findings. Other: None. IMPRESSION: 1. Motion artifact, despite repeat acquisitions. 2. 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 Right frontal encephalomalacia has progressed from prior. Right temporal encephalomalacia is grossly stable. 3. No evidence of acute intracranial abnormality allowing for motion artifact. Electronically Signed   By: Narda Rutherford M.D.   On: 07/01/2023 15:30   DG Chest Portable 1 View  Result Date: 07/01/2023 CLINICAL DATA:  Fever and altered mental status EXAM: PORTABLE CHEST 1 VIEW COMPARISON:  Chest radiograph dated 09/09/2022, CT chest dated 02/16/2018 FINDINGS: Normal lung volumes. Similar left lung scarring and right basilar linear opacities. 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 No pleural effusion or pneumothorax. The heart size and mediastinal contours are within normal limits. Similar dense left thyroid calcification. No acute osseous abnormality. IMPRESSION: 1. Similar left lung scarring and right basilar linear opacities,  likely atelectasis. 2. 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 Electronically Signed   By: Agustin Cree M.D.   On: 07/01/2023 11:29    Pending Labs [employer] (From  admission, onward)     Start     Ordered   07/02/23 0500  Basic metabolic panel  Tomorrow morning,   R        07/01/23 1600   07/02/23 0500  CBC  Tomorrow morning,   R        07/01/23 1600   07/01/23 0943  Urinalysis, w/ Reflex to Culture (Infection Suspected) -Urine, Clean Catch  Once,   URGENT       Question:  Specimen Source  Answer:  Urine, Clean Catch   07/01/23 0942            Vitals/Pain Today's Vitals   07/01/23 1300 07/01/23 1330 07/01/23 1400 07/01/23 1430  BP: (!) 152/81 118/86 (!) 157/81 (!) 141/89  Pulse: 96 89 90 88  Resp: 17 17 14 14   Temp:      TempSrc:      SpO2: 97% 97% 98% 99%  Weight:      Height:        Isolation Precautions No active isolations  Medications Medications  enoxaparin (LOVENOX) injection 40 mg (has no administration in time range)  acetaminophen (TYLENOL) tablet 650 mg (has no administration in time range)    Or  acetaminophen (TYLENOL) suppository 650 mg (has no administration in time range)  senna-docusate (Senokot-S) tablet 1 tablet (has no administration in time range)  lactated ringers bolus 1,000 mL (1,000 mLs Intravenous Bolus 07/01/23 1323)  ceFEPIme (MAXIPIME) 2 g in sodium chloride 0.9 % 100 mL IVPB (0 g Intravenous Stopped 07/01/23 1403)  vancomycin (VANCOREADY) IVPB 1250 mg/250 mL (0 mg Intravenous Stopped 07/01/23 1545)    Mobility non-ambulatory     Focused Assessments Musculoskeletal   R Recommendations: See Admitting Provider Note  Report given to:   Additional Notes:  noted with right side weakness s/p stroke. Very comabtive when receiving care.

## 2023-07-01 NOTE — Progress Notes (Signed)
ED Pharmacy Antibiotic Sign Off An antibiotic consult was received from an ED provider for vancomycin and cefepime per pharmacy dosing for sepsis. A chart review was completed to assess appropriateness.   The following one time order(s) were placed:  Vancomycin 1250 mg IV x 1 Cefepime 2g Iv x 1  Further antibiotic and/or antibiotic pharmacy consults should be ordered by the admitting provider if indicated.   Thank you for allowing pharmacy to be a part of this patient's care.   Daylene Posey, [facility]  Clinical Pharmacist 07/01/23 12:59 PM

## 2023-07-01 NOTE — ED Provider Notes (Signed)
[place] EMERGENCY DEPARTMENT AT [facility] Provider Note   CSN: [id number] Arrival date & time: 07/01/23  0845     History  Chief Complaint  Patient presents with   Fever    Jorge Burns is a 82 y.o. male.  HPI     82yo male with history of advanced vascular dementia, hemiplegia, hemiparesis following cerebral infarction, CVA 2019, depression, anxiety, dysphagia, who presents with concern for temperature 100.0 at facility and he was not acting himself.   Not biological granddaughter, just friend from community who is his point of contact, helps with medical decisions.  POA, not official paperwork but was living with her prior to going t facility and she helped get him to facility. Does not have any other family.   Nurse aid thought his face was a little red, nurse worried, thought he had temp 100.0.  Worried his blood pressure high, not eating breakfast.  Spoke with [facility] nurse: Normally eats ok, was acting like he was in pain, laying on his side trying to eat, hard time eating this AM   Not acting himself Gave him tylenol 1000mg , some of it maybe came out  No vomiting other concerns   [employer] interpreter present in person for evaluation, with history remaining limited by his dementia, aphasia  Home Medications Prior to Admission medications   Medication Sig Start Date End Date Taking? Authorizing Provider  aspirin 81 MG chewable tablet Chew 1 tablet (81 mg total) by mouth daily. 02/19/18   Leatha Gilding, MD  atorvastatin (LIPITOR) 80 MG tablet Take 1 tablet (80 mg total) by mouth daily at 6 PM. 04/12/18   Jones Bales, NP  brimonidine (ALPHAGAN) 0.2 % ophthalmic solution Place 1 drop into the left eye 2 (two) times daily. 12/29/18   Fawze, Mina A, PA-C  citalopram (CELEXA) 10 MG tablet Take 1 tablet (10 mg total) by mouth daily. 03/11/18   Angiulli, Mcarthur Rossetti, PA-C  clopidogrel (PLAVIX) 75 MG tablet Take 1 tablet (75 mg total) by mouth daily.  03/11/18   Angiulli, Mcarthur Rossetti, PA-C  dorzolamide (TRUSOPT) 2 % ophthalmic solution Place 1 drop into the left eye 3 (three) times daily. 08/08/18   Eber Hong, MD  dorzolamidel-timolol (COSOPT) 22.3-6.8 MG/ML SOLN ophthalmic solution Place 1 drop into the left eye 2 (two) times daily. 12/29/18   Fawze, Mina A, PA-C  HYDROcodone-acetaminophen (NORCO/VICODIN) 5-325 MG tablet Take 1 tablet by mouth every 6 (six) hours as needed. 08/08/18   Eber Hong, MD  hydrocortisone (ANUSOL-HC) 2.5 % rectal cream Apply rectally 2 times daily 03/22/18   Wieters, Hallie C, PA-C  latanoprost (XALATAN) 0.005 % ophthalmic solution Place 1 drop into the left eye at bedtime. 08/08/18   Eber Hong, MD  lisinopril (PRINIVIL,ZESTRIL) 2.5 MG tablet Take 1 tablet (2.5 mg total) by mouth daily. 03/11/18   Angiulli, Mcarthur Rossetti, PA-C  methylphenidate (RITALIN) 10 MG tablet Take 1 tablet (10 mg total) by mouth 2 (two) times daily with breakfast and lunch. 03/11/18   Angiulli, Mcarthur Rossetti, PA-C  pilocarpine (PILOCAR) 1 % ophthalmic solution Place 1 drop into the left eye 4 (four) times daily. 12/29/18   Fawze, Mina A, PA-C  potassium chloride SA (K-DUR,KLOR-CON) 20 MEQ tablet Take 1 tablet (20 mEq total) by mouth daily. 04/08/18   Gilda Crease, MD      Allergies    Patient has no known allergies.    Review of Systems   Review of Systems  Physical  Exam Updated Vital Signs BP 129/76   Pulse 82   Temp 98.1 F (36.7 C)   Resp 18   Ht 5\' 6"  (1.676 m)   Wt 60 kg   SpO2 95%   BMI 21.35 kg/m  Physical Exam Vitals and nursing note reviewed.  Constitutional:      General: He is not in acute distress.    Appearance: He is well-developed. He is not diaphoretic.  HENT:     Head: Normocephalic.     Comments: Left eye cloudy (baseline)  Cardiovascular:     Rate and Rhythm: Normal rate and regular rhythm.     Heart sounds: Normal heart sounds. No murmur heard.    No friction rub. No gallop.  Pulmonary:     Effort:  Pulmonary effort is normal. No respiratory distress.     Breath sounds: Normal breath sounds. No wheezing or rales.  Abdominal:     General: There is no distension.     Palpations: Abdomen is soft.     Tenderness: There is no abdominal tenderness. There is no guarding.  Musculoskeletal:     Cervical back: Normal range of motion.  Skin:    General: Skin is warm and dry.  Neurological:     Mental Status: He is alert.     Comments: Right side spastic hemiparesis RUE and RLE Left arm with spontaneous movements, pulling up covers, tapping chest and holding chin, squeezes hand to command but does not lift to command (with interpreter present) Does not state name or answer questions     ED Results / Procedures / Treatments   Labs (all labs ordered are listed, but only abnormal results are displayed) Labs Reviewed  CBC WITH DIFFERENTIAL/PLATELET - Abnormal; Notable for the following components:      Result Value   WBC 21.7 (*)    MCH 25.9 (*)    Neutro Abs 19.9 (*)    Abs Immature Granulocytes 0.14 (*)    All other components within normal limits  COMPREHENSIVE METABOLIC PANEL - Abnormal; Notable for the following components:   Glucose, Bld 161 (*)    Creatinine, Ser 1.39 (*)    Albumin 3.0 (*)    GFR, Estimated 51 (*)    All other components within normal limits  I-STAT CG4 LACTIC ACID, ED - Abnormal; Notable for the following components:   Lactic Acid, Venous 2.8 (*)    All other components within normal limits  RESP PANEL BY RT-PCR (RSV, FLU A&B, COVID)  [id number]  CULTURE, BLOOD (ROUTINE X 2)  CULTURE, BLOOD (ROUTINE X 2)  URINALYSIS, W/ REFLEX TO CULTURE (INFECTION SUSPECTED)  I-STAT CG4 LACTIC ACID, ED    EKG EKG Interpretation Date/Time:  Thursday July 01 2023 09:05:37 EDT Ventricular Rate:  128 PR Interval:  170 QRS Duration:  89 QT Interval:  304 QTC Calculation: 444 R Axis:   0  Text Interpretation: Sinus tachycardia Indeterminate axis Low voltage, extremity  leads Nonspecific T abnormalities, lateral leads Artifact in lead(s) Since prior ECG< rate has increased, artifact present Confirmed by Alvira Monday ([id number]) on 07/01/2023 9:44:45 AM  Radiology DG Chest Portable 1 View  Result Date: 07/01/2023 CLINICAL DATA:  Fever and altered mental status EXAM: PORTABLE CHEST 1 VIEW COMPARISON:  Chest radiograph dated 09/09/2022, CT chest dated 02/16/2018 FINDINGS: Normal lung volumes. Similar left lung scarring and right basilar linear opacities. 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 No pleural effusion or pneumothorax. The heart size and  mediastinal contours are within normal limits. Similar dense left thyroid calcification. No acute osseous abnormality. IMPRESSION: 1. Similar left lung scarring and right basilar linear opacities, likely atelectasis. 2. 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 Electronically Signed   By: Agustin Cree M.D.   On: 07/01/2023 11:29    Procedures Procedures    Medications Ordered in ED Medications  lactated ringers bolus 1,000 mL (has no administration in time range)  ceFEPIme (MAXIPIME) 2 g in sodium chloride 0.9 % 100 mL IVPB (has no administration in time range)  vancomycin (VANCOREADY) IVPB 1250 mg/250 mL (has no administration in time range)    ED Course/ Medical Decision Making/ A&P                                   82yo male with history of advanced vascular dementia, hemiplegia, hemiparesis following cerebral infarction, CVA 2019, depression, anxiety, dysphagia, who presents with concern for temperature 100.0 at facility and he was not acting himself.   Initially, he was found to not have a rectal temperature and did not know of into any antipyretics given--however it was discovered that he had been given 1000 mg of Tylenol prior to arrival, and his white blood cell count returned at 21,000 concerning for possible bacterial  infection.  Facility reports he did not appear to be himself, "red face" and elevated temperature, not eating normally, he is awake and alert on my exam but not answering questions or reliably following commands which is his baseline per granddaughter.   Chest x-ray completed and evaluated by me and radiology shows a cavitary lesion present on imaging in 2019 as well as today.   Does not sound to have respiratory symptoms per facility to suggest TB reactivation.  COVID/flu/RSV negative as etiolog of fever.  Labs completed and personally about interpreted by me show no clinically significant electrolyte abnormalities.  White blood cell count 21,000.  Added on blood cultures, lactic acid.  He does not appear to have abdominal tenderness on exam to suggest intra-abdominal source of his illness.  While he has a change in his mental status from his baseline, he is alert, without meningeal signs and have low suspicion at this time for bacterial meningitis.  Lactic acid 2.8.  Will order bolus of LR, vanc/cefepime for sepsis of unknown etiology> Given hemodynamically stable and lactic acid elevation may be secondary to dehydration, (and in setting of shortage of IVF), will give 1 L and reassess.  Nursing having difficulty obtain UA at this time.  Will admit for further care.           Final Clinical Impression(s) / ED Diagnoses Final diagnoses:  Leukocytosis, unspecified type  Lactic acidosis    Rx / DC Orders ED Discharge Orders     None         Alvira Monday, MD 07/01/23 2136

## 2023-07-02 DIAGNOSIS — I69320 Aphasia following cerebral infarction: Secondary | ICD-10-CM | POA: Diagnosis not present

## 2023-07-02 DIAGNOSIS — K648 Other hemorrhoids: Secondary | ICD-10-CM | POA: Diagnosis present

## 2023-07-02 DIAGNOSIS — L98492 Non-pressure chronic ulcer of skin of other sites with fat layer exposed: Secondary | ICD-10-CM | POA: Diagnosis present

## 2023-07-02 DIAGNOSIS — H547 Unspecified visual loss: Secondary | ICD-10-CM | POA: Diagnosis present

## 2023-07-02 DIAGNOSIS — R7989 Other specified abnormal findings of blood chemistry: Secondary | ICD-10-CM | POA: Diagnosis present

## 2023-07-02 DIAGNOSIS — T148XXA Other injury of unspecified body region, initial encounter: Secondary | ICD-10-CM | POA: Insufficient documentation

## 2023-07-02 DIAGNOSIS — R4182 Altered mental status, unspecified: Secondary | ICD-10-CM | POA: Diagnosis present

## 2023-07-02 DIAGNOSIS — F32A Depression, unspecified: Secondary | ICD-10-CM | POA: Diagnosis present

## 2023-07-02 DIAGNOSIS — Z1152 Encounter for screening for COVID-19: Secondary | ICD-10-CM | POA: Diagnosis not present

## 2023-07-02 DIAGNOSIS — F01C4 Vascular dementia, severe, with anxiety: Secondary | ICD-10-CM | POA: Diagnosis present

## 2023-07-02 DIAGNOSIS — H409 Unspecified glaucoma: Secondary | ICD-10-CM | POA: Diagnosis present

## 2023-07-02 DIAGNOSIS — I69351 Hemiplegia and hemiparesis following cerebral infarction affecting right dominant side: Secondary | ICD-10-CM | POA: Diagnosis not present

## 2023-07-02 DIAGNOSIS — D72829 Elevated white blood cell count, unspecified: Secondary | ICD-10-CM

## 2023-07-02 DIAGNOSIS — E639 Nutritional deficiency, unspecified: Secondary | ICD-10-CM | POA: Diagnosis present

## 2023-07-02 DIAGNOSIS — R911 Solitary pulmonary nodule: Secondary | ICD-10-CM | POA: Diagnosis present

## 2023-07-02 DIAGNOSIS — F01C2 Vascular dementia, severe, with psychotic disturbance: Secondary | ICD-10-CM | POA: Diagnosis present

## 2023-07-02 DIAGNOSIS — I10 Essential (primary) hypertension: Secondary | ICD-10-CM | POA: Diagnosis present

## 2023-07-02 DIAGNOSIS — E785 Hyperlipidemia, unspecified: Secondary | ICD-10-CM | POA: Diagnosis present

## 2023-07-02 DIAGNOSIS — E872 Acidosis, unspecified: Secondary | ICD-10-CM | POA: Diagnosis present

## 2023-07-02 DIAGNOSIS — Z515 Encounter for palliative care: Secondary | ICD-10-CM | POA: Diagnosis not present

## 2023-07-02 DIAGNOSIS — F01C3 Vascular dementia, severe, with mood disturbance: Secondary | ICD-10-CM | POA: Diagnosis present

## 2023-07-02 DIAGNOSIS — L8990 Pressure ulcer of unspecified site, unspecified stage: Secondary | ICD-10-CM

## 2023-07-02 DIAGNOSIS — Z603 Acculturation difficulty: Secondary | ICD-10-CM | POA: Diagnosis present

## 2023-07-02 DIAGNOSIS — R319 Hematuria, unspecified: Secondary | ICD-10-CM | POA: Diagnosis present

## 2023-07-02 DIAGNOSIS — Z66 Do not resuscitate: Secondary | ICD-10-CM | POA: Diagnosis present

## 2023-07-02 DIAGNOSIS — R64 Cachexia: Secondary | ICD-10-CM | POA: Diagnosis present

## 2023-07-02 DIAGNOSIS — K6289 Other specified diseases of anus and rectum: Secondary | ICD-10-CM | POA: Diagnosis present

## 2023-07-02 DIAGNOSIS — E8729 Other acidosis: Secondary | ICD-10-CM | POA: Diagnosis not present

## 2023-07-02 LAB — BASIC METABOLIC PANEL
Anion gap: 7 (ref 5–15)
BUN: 17 mg/dL (ref 8–23)
CO2: 27 mmol/L (ref 22–32)
Calcium: 9.1 mg/dL (ref 8.9–10.3)
Chloride: 111 mmol/L (ref 98–111)
Creatinine, Ser: 1.17 mg/dL (ref 0.61–1.24)
GFR, Estimated: >60 mL/min (ref >60)
Glucose, Bld: 107 mg/dL — ABNORMAL HIGH (ref 70–99)
Potassium: 3.9 mmol/L (ref 3.5–5.1)
Sodium: 145 mmol/L (ref 135–145)

## 2023-07-02 LAB — CBC
HCT: 38.8 % — ABNORMAL LOW (ref 39.0–52.0)
Hemoglobin: 12.6 g/dL — ABNORMAL LOW (ref 13.0–17.0)
MCH: 26.3 pg (ref 26.0–34.0)
MCHC: 32.5 g/dL (ref 30.0–36.0)
MCV: 80.8 fL (ref 80.0–100.0)
Platelets: 276 10*3/uL (ref 150–400)
RBC: 4.8 MIL/uL (ref 4.22–5.81)
RDW: 14.6 % (ref 11.5–15.5)
WBC: 11.6 10*3/uL — ABNORMAL HIGH (ref 4.0–10.5)
nRBC: 0 % (ref 0.0–0.2)

## 2023-07-02 LAB — LACTIC ACID, PLASMA
Lactic Acid, Venous: 3.7 mmol/L (ref 0.5–1.9)
Lactic Acid, Venous: 0.8 mmol/L (ref 0.5–1.9)

## 2023-07-02 MED ORDER — LACTATED RINGERS IV BOLUS
500.0000 mL | Freq: Once | INTRAVENOUS | Status: AC
  Administered 2023-07-02: 500 mL via INTRAVENOUS

## 2023-07-02 MED ORDER — CLONIDINE HCL 0.1 MG/24HR TD PTWK
0.1000 mg | MEDICATED_PATCH | TRANSDERMAL | Status: DC
  Administered 2023-07-02: 0.1 mg via TRANSDERMAL
  Filled 2023-07-02: qty 1

## 2023-07-02 MED ORDER — SODIUM CHLORIDE 0.9 % IV SOLN
2.0000 g | INTRAVENOUS | Status: DC
  Administered 2023-07-02 – 2023-07-03 (×2): 2 g via INTRAVENOUS
  Filled 2023-07-02 (×2): qty 20

## 2023-07-02 NOTE — Progress Notes (Signed)
Patient refused all his bedtime meds. Patient refused to open his mouth and will hit me hand whenever a got close to him.

## 2023-07-02 NOTE — Progress Notes (Signed)
Called by nurse after in/out catheterization yielded frank blood. Evaluated at bedside. Nurse reports attempting catheterization and meeting some resistance before efflux of blood into collection bag. Apparently there was some small blood at urethral meatus prior to catheterization attempt. She wonders if there is a clot obstructing urethra. The patient is non-verbal. Heart rate is 100. He is laying quietly initially but becomes quickly agitated during exam. His abdomen is flat, nothing palpable in suprapubic region. Some blood at urethral meatus. Not sure where this blood is coming from. Query urethral injury but with blood apparent at meatus prior to catheterization attempt this is less likely unless there was undocumented attempt prior to hospitalization. At any rate he's hemodynamically stable, a.m. CBC ordered. Requested external urinary catheter placement to track outputs or lack thereof overnight. May need urology evaluation in a.m.  Update 1:48 AM Note lactate returned at 3.7. Otherwise no changes in clinical status. Vitals stable. Repeat bolus of 500 mL and repeat lactate at ~0300.  Marrianne Mood MD 07/02/2023, 12:34 AM

## 2023-07-02 NOTE — Consult Note (Addendum)
WOC Nurse Consult Note: Reason for Consult: sacral wound  Wound type: full thickness unknown etiology around rectum  Pressure Injury POA: NA, not felt to be due to pressure  Measurement: approximately 3 cm x 2 cm  Wound bed: 50% pink moist 50% yellow tan slough  Drainage (amount, consistency, odor) minimal tan exudate   Periwound: rectum  Dressing procedure/placement/frequency:  Clean wound around rectum with NS, apply saline moistened gauze to wound bed twice daily, cover with dry gauze and silicone foam or ABD pad whichever is preferred.   Secure chat sent to primary MD regarding this wound and need for further assessment by surgery.  Primary team plans to assess area on rounds.    WOC team will not follow at this time. Re-consult if further needs arise.   Thank you,    Priscella Mann MSN, RN-BC, [employer] [phone]

## 2023-07-02 NOTE — Care Management Obs Status (Cosign Needed)
MEDICARE OBSERVATION STATUS NOTIFICATION   Patient Details  Name: Jorge Burns MRN: [id number] Date of Birth: 09-10-1941   Medicare Observation Status Notification Given:  Yes    Janae Bridgeman, RN 07/02/2023, 11:47 AM

## 2023-07-02 NOTE — Progress Notes (Addendum)
HD#0 SUBJECTIVE:  Patient Summary: Jorge Burns is a 82 y.o. male with a pertinent PMH of dementia, right sided hemiplegia and hemiparesis s/p severe stroke in 2019, anxiety and depression, and dysphagia, who was brought from [facility] for concerns of behavioral changes, elevated blood pressure, and fever and was admitted for altered mental status.   Overnight Events: Homero Fellers blood noted during in and out cath. Lactate 3.7. 500 cc bolus given.   Interim History: Patient was evaluated at bedside. Interpreter was used during the assessment. Patient was sleeping and unable to participate in assessment. No sacral wound, but rectum appears scarred with granulation tissue. No rectal sphincter visualized.   OBJECTIVE:  Vital Signs: Vitals:   07/01/23 1545 07/01/23 1719 07/02/23 0144 07/02/23 0740  BP: (!) 159/111 (!) 154/93 122/68 (!) 166/79  Pulse: (!) 102 (!) 103 82 80  Resp: 20 19 17    Temp:  98 F (36.7 C) 98 F (36.7 C)   TempSrc:  Oral Oral   SpO2: 96% 100% 98%   Weight:      Height:       Supplemental O2: Room Air SpO2: 98 %  Filed Weights   07/01/23 0913  Weight: 60 kg     Intake/Output Summary (Last 24 hours) at 07/02/2023 1131 Last data filed at 07/02/2023 1037 Gross per 24 hour  Intake 350 ml  Output 400 ml  Net -50 ml   Net IO Since Admission: -50 mL [07/02/23 1131]  Physical Exam:  General: laying in bed, asleep, not participating in interaction; on reassessment later in the morning he was awake and active, near his baseline when he presented to the ED Cardiac: RRR, no m/r/g Pulmonary: CTA bilaterally, normal rate and effort Abdomen: soft, non-distended Skin: no sacral wounds or other lesions Rectum: no sphincter tone appreciated, scarring and granulation tissue in rectal vault, minimal serosanguinous drainage Neuro: hemiplegic on right, no new focal deficits observed, unable to assess mental status  Patient Lines/Drains/Airways Status     Active  Line/Drains/Airways     Name Placement date Placement time Site Days   Peripheral IV 09/17/22 20 G Right 09/17/22  0630  --  288   Pressure Injury 07/01/23 Rectum Stage 3 -  Full thickness tissue loss. Subcutaneous fat may be visible but bone, tendon or muscle are NOT exposed. 07/01/23  1841  -- 1            Pertinent Labs:    Latest Ref Rng & Units 07/02/2023    7:31 AM 07/01/2023    9:43 AM 09/17/2022    7:08 AM  CBC  WBC 4.0 - 10.5 K/uL 11.6  21.7    Hemoglobin 13.0 - 17.0 g/dL 16.1  09.6  04.5   Hematocrit 39.0 - 52.0 % 38.8  46.1  46.0   Platelets 150 - 400 K/uL 276  326         Latest Ref Rng & Units 07/02/2023    7:31 AM 07/01/2023    9:43 AM 09/17/2022    7:08 AM  CMP  Glucose 70 - 99 mg/dL 409  811  914   BUN 8 - 23 mg/dL 17  21  17    Creatinine 0.61 - 1.24 mg/dL 7.82  9.56  2.13   Sodium 135 - 145 mmol/L 145  144  144   Potassium 3.5 - 5.1 mmol/L 3.9  4.3  4.5   Chloride 98 - 111 mmol/L 111  108  110   CO2 22 -  32 mmol/L 27  24    Calcium 8.9 - 10.3 mg/dL 9.1  9.4    Total Protein 6.5 - 8.1 g/dL  8.1    Total Bilirubin 0.3 - 1.2 mg/dL  0.8    Alkaline Phos 38 - 126 U/L  105    AST 15 - 41 U/L  33    ALT 0 - 44 U/L  31      No results for input(s): "GLUCAP" in the last 72 hours.   Lactic acid: 0.8 Blood cultures: no growth  Pertinent Imaging: CT Head Wo Contrast  Result Date: 07/01/2023 CLINICAL DATA:  Mental status change, unknown cause EXAM: CT HEAD WITHOUT CONTRAST TECHNIQUE: Contiguous axial images were obtained from the base of the skull through the vertex without intravenous contrast. RADIATION DOSE REDUCTION: This exam was performed according to the departmental dose-optimization program which includes automated exposure control, adjustment of the mA and/or kV according to patient size and/or use of iterative reconstruction technique. COMPARISON:  Head CT 09/17/2022 FINDINGS: Brain: Motion artifact, despite repeat acquisitions. The areas of  hemorrhage on prior have resolved, although some areas are obscured by motion. 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 There is associated ex vacuole dilatation of the left lateral ventricle. Right frontal encephalomalacia has progressed from prior. Right temporal encephalomalacia is grossly stable. No evidence of acute hemorrhage allowing for motion artifact limitations. No gross subdural hematoma, although more detailed assessment is limited due to motion. Vascular: Grossly negative. Skull: Grossly negative, allowing for motion. Sinuses/Orbits: No acute findings. Other: None. IMPRESSION: 1. Motion artifact, despite repeat acquisitions. 2. 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 Right frontal encephalomalacia has progressed from prior. Right temporal encephalomalacia is grossly stable. 3. No evidence of acute intracranial abnormality allowing for motion artifact. Electronically Signed   By: Narda Rutherford M.D.   On: 07/01/2023 15:30    ASSESSMENT/PLAN:  Assessment: Principal Problem:   Leukocytosis  Plan: Altered mental status Leukocytosis, resolving Lactic acidosis, resolved Elevated Creatinine, resolved He remains afebrile. Procalcitonin <0.10. Lactic down to 0.8. No growth on blood cultures. Urinalysis still needs to be collected. Frank bleeding was noted overnight during an in and out catheterization, possibly due to a traumatic cath prior to presentation. Void this morning was pinkish to clear, will continue to monitor for resolution. We will continue with a sepsis rule out, keeping antibiotics on for 48 hours. He currently seems clinically near his baseline and his labs are improving. If he remains stable and no infection is identified, he can be discharged back to SNF after 48 hrs of antibiotics.   -Switch cefepime to ceftriaxone -F/u urinalysis, blood cultures -If urinary bleeding persists, may need urology consult -Delirium  precautions -Monitor temperature  Rectal wound Patient has a rectal wound with subcutaneous tissue visible but no exposed bone or tendon. Not thought to be a pressure injury. He has some granulation tissue and scarring present, and the sphincter tone seems absent, as if he had a prior rectal tube. I spoke to [facility], and they stated that he has not had a rectal tube while there. They had not noticed his rectal wound and have not noted any rectal bleeding. Per chart review, it appears he had a large prolapsing hemorrhoid in 2019, but it is unclear if he underwent any procedures. The wound care team evaluated him and suggested a general surgery or GI consult, but we are hesitant to subject him to any unnecessary surgical interventions given his poor baseline functional status. For  now, we will manage it conservatively with a bowel regimen and dressing changes per wound team.    Chronic conditions: Glaucoma- continued Latanoprost 0.005%, instill 1 drop in left eye at bedtime; Brimonidine tartrate 0.2%, 1 drop in left eye BID for glaucoma; Pilocarpine HCl 1%, 1 drop into both eyes QID for glaucoma; Dorzolamide-Timolol 22.3-6.8 mg 1 drop in left eye BID for glaucoma. Depression/anxiety- continued Mirtazapine 7.5 mg nightly Dementia- continued Memantine 5 mg nightly Hx of CVA, hyperlipidemia- continued ASA 81 mg daily, atorvastatin 40 mg daily. Held plavix 75 mg given bleeding concerns.  Hypertension- Clonidine patch weekly, held  Best Practice: Diet: Dys 3 IVF: Fluids: None VTE: enoxaparin (LOVENOX) injection 40 mg Start: 07/01/23 1615 Code: DNR AB: None Family Contact: Granddaughter, [name], called and notified. DISPO: Anticipated discharge in 1-2 days to Skilled nursing facility pending medical workup and IV antibiotics.  Signature: Annett Fabian, MD  Internal Medicine Resident, PGY-1 [facility]  Pager: [phone] 11:31 AM, 07/02/2023   Please contact the  on call pager after 5 pm and on weekends at [phone].

## 2023-07-02 NOTE — Progress Notes (Signed)
Internal Medicine Attending:   I was physically present during the key portions of the resident provided service and participated in medical decision making of the patient's management care in the assessment and plan.   Unable to attest resident note - will see about getting this fixed tomorrow. But please see my H&P attestation from earlier.

## 2023-07-02 NOTE — TOC Initial Note (Signed)
Transition of Care [facility]) - Initial/Assessment Note    Patient Details  Name: Jorge Burns MRN: [id number] Date of Birth: [birth year]  Transition of Care [facility]) CM/SW Contact:    Janae Bridgeman, RN Phone Number: 07/02/2023, 12:05 PM  Clinical Narrative:                 CM met with the patient at the bedside but patient was unable to participate in assessment.  Patient admitted to the hospital with AMS, leukocytosis.  I called and spoke with the patient's granddaughter, [name] by phone and provide Medicare Observation notice and placed copy of the notice in the chart as well.  Granddaughter states that the patient is LTC at [facility] since 2019 and will return to the facility for care when medically stable for discharge by PTAR transport per granddaughter.  I provided an update to the granddaughter by phone and she plans to visit with the patient this afternoon.  No other TOC needs - TOC Team will continue to follow the patient for TOC needs and discharge back to the facility when stable.  Expected Discharge Plan: Long Term Nursing Home Barriers to Discharge: Continued Medical Work up   Patient Goals and CMS Choice Patient states their goals for this hospitalization and ongoing recovery are:: Patient unable to state goals CMS Medicare.gov Compare Post Acute Care list provided to:: Patient Represenative (must comment) (spoke with granddaughter, [name] Shrieves) Choice offered to / list presented to : Adult Children [place] ownership interest in [facility].provided to:: Adult Children (Granddaughter, [name] Harkins)    Expected Discharge Plan and Services   Discharge Planning Services: CM Consult Post Acute Care Choice: Nursing Home Living arrangements for the past 2 months: Skilled Holiday representative (LTC at [facility] since 2019)                                      Prior Living Arrangements/Services Living arrangements for the past 2  months: Skilled Holiday representative (LTC at [facility] since 2019) Lives with:: Facility Resident Patient language and need for interpreter reviewed:: Yes Do you feel safe going back to the place where you live?: Yes      Need for Family Participation in Patient Care: Yes (Comment) Care giver support system in place?: Yes (comment)   Criminal Activity/Legal Involvement Pertinent to Current Situation/Hospitalization: No - Comment as needed  Activities of Daily Living      Permission Sought/Granted Permission sought to share information with : Case Manager, Family Supports Permission granted to share information with : Yes, Verbal Permission Granted        Permission granted to share info w Relationship: [name] Bisono, granddaughter - [phone]     Emotional Assessment Appearance:: Appears stated age Attitude/Demeanor/Rapport: Unable to Assess Affect (typically observed): Unable to Assess   Alcohol / Substance Use: Not Applicable Psych Involvement: No (comment)  Admission diagnosis:  Lactic acidosis [E87.20] Leukocytosis [D72.829] Leukocytosis, unspecified type [D72.829] Patient Active Problem List   Diagnosis Date Noted   Altered mental status 07/02/2023   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 [place] [address] Phone: [phone] Fax: [phone]  [place] [facility] [address] [place] [address] Phone: [phone] Fax:  [phone]     Social Determinants of Health (SDOH) Social History: SDOH Screenings   Tobacco Use: Low Risk  (07/01/2023)   SDOH Interventions:     Readmission Risk Interventions     No data to display

## 2023-07-03 LAB — URINALYSIS, W/ REFLEX TO CULTURE (INFECTION SUSPECTED)
Bacteria, UA: NONE SEEN
Bilirubin Urine: NEGATIVE
Glucose, UA: NEGATIVE mg/dL
Ketones, ur: 5 mg/dL — AB
Leukocytes,Ua: NEGATIVE
Nitrite: NEGATIVE
Protein, ur: 30 mg/dL — AB
RBC / HPF: >50 RBC/hpf (ref 0–5)
Specific Gravity, Urine: 1.019 (ref 1.005–1.030)
pH: 5 (ref 5.0–8.0)

## 2023-07-03 LAB — CBC
HCT: 39.6 % (ref 39.0–52.0)
Hemoglobin: 12.7 g/dL — ABNORMAL LOW (ref 13.0–17.0)
MCH: 25.3 pg — ABNORMAL LOW (ref 26.0–34.0)
MCHC: 32.1 g/dL (ref 30.0–36.0)
MCV: 78.9 fL — ABNORMAL LOW (ref 80.0–100.0)
Platelets: 310 10*3/uL (ref 150–400)
RBC: 5.02 MIL/uL (ref 4.22–5.81)
RDW: 14.6 % (ref 11.5–15.5)
WBC: 10.6 10*3/uL — ABNORMAL HIGH (ref 4.0–10.5)
nRBC: 0 % (ref 0.0–0.2)

## 2023-07-03 NOTE — Plan of Care (Signed)
Problem: Coping: Goal: Level of anxiety will decrease Outcome: Progressing   Problem: Pain Managment: Goal: General experience of comfort will improve Outcome: Progressing   Problem: Safety: Goal: Ability to remain free from injury will improve Outcome: Progressing   Problem: Skin Integrity: Goal: Risk for impaired skin integrity will decrease Outcome: Progressing

## 2023-07-03 NOTE — Progress Notes (Signed)
Pt is extremely hard to provide care for. Pt becomes very aggressive and physically fights, spits at and tries to bite staff when we try to do any kind of care. He is refusing meds as well. He is able to feed self finger type foods but nurse was only able to get him to eat a banana and about 4 bites of sandwich before he started throwing the food across the room. All care requires at least 2 people because of the physical fighting. Changing from incontinent episodes require 3 people. Made MD aware of pts behavior and care plan options. Will continue to monitor.

## 2023-07-03 NOTE — Progress Notes (Signed)
HD#1 SUBJECTIVE:  Patient Summary: Jorge Burns is a 82 y.o. 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   Overnight Events: No acute events overnight  Interim History: Patient was evaluated bedside.  Interpreter over phone was used during assessment.  Patient was awake but remains nonverbal.    OBJECTIVE:  Vital Signs: Vitals:   07/02/23 0740 07/02/23 1608 07/02/23 2024 07/03/23 0440  BP: (!) 166/79 (!) 160/94 (!) 157/90   Pulse: 80 95 (!) 58 97  Resp:  18 16   Temp:  97.8 F (36.6 C) 99.5 F (37.5 C) (!) 97.4 F (36.3 C)  TempSrc:  Oral Axillary Rectal  SpO2:  98% 92%   Weight:      Height:       Supplemental O2: Room Air SpO2: 92 %  Filed Weights   07/01/23 0913  Weight: 60 kg     Intake/Output Summary (Last 24 hours) at 07/03/2023 0550 Last data filed at 07/02/2023 1609 Gross per 24 hour  Intake --  Output 700 ml  Net -700 ml   Net IO Since Admission: -350 mL [07/03/23 0550]  Physical Exam: General: Awake, cachectic appearing lying in bed comfortably, in no acute distress Cardiac: Regular rate and rhythm, no LE edema Pulmonary: Normal work of breathing on room air, lungs clear to auscultation bilaterally Skin: Warm and dry Neuro: Awake, nonverbal, hemiplegic on right, no new focal deficits observed, unable to further assess due to limited participation by patient  Prior exam: Rectum: no sphincter tone appreciated, scarring and granulation tissue in rectal vault, minimal serosanguinous drainage  Patient Lines/Drains/Airways Status     Active Line/Drains/Airways     Name Placement date Placement time Site Days   Peripheral IV 09/17/22 20 G Right 09/17/22  0630  --  288   Pressure Injury 07/01/23 Rectum Stage 3 -   Full thickness tissue loss. Subcutaneous fat may be visible but bone, tendon or muscle are NOT exposed. 07/01/23  1841  -- 1            Pertinent Labs:    Latest Ref Rng & Units 07/02/2023    7:31 AM 07/01/2023    9:43 AM 09/17/2022    7:08 AM  CBC  WBC 4.0 - 10.5 K/uL 11.6  21.7    Hemoglobin 13.0 - 17.0 g/dL 78.2  95.6  21.3   Hematocrit 39.0 - 52.0 % 38.8  46.1  46.0   Platelets 150 - 400 K/uL 276  326         Latest Ref Rng & Units 07/02/2023    7:31 AM 07/01/2023    9:43 AM 09/17/2022    7:08 AM  CMP  Glucose 70 - 99 mg/dL 086  578  469   BUN 8 - 23 mg/dL 17  21  17    Creatinine 0.61 - 1.24 mg/dL 6.29  5.28  4.13   Sodium 135 - 145 mmol/L 145  144  144   Potassium 3.5 - 5.1 mmol/L 3.9  4.3  4.5   Chloride 98 - 111 mmol/L 111  108  110   CO2 22 - 32 mmol/L 27  24    Calcium 8.9 - 10.3 mg/dL 9.1  9.4    Total Protein 6.5 - 8.1 g/dL  8.1  Total Bilirubin 0.3 - 1.2 mg/dL  0.8    Alkaline Phos 38 - 126 U/L  105    AST 15 - 41 U/L  33    ALT 0 - 44 U/L  31      No results for input(s): "GLUCAP" in the last 72 hours.   Blood cultures: No growth X 2 days  Pertinent Imaging: No results found.  ASSESSMENT/PLAN:  Assessment: Principal Problem:   Altered mental status Active Problems:   Leukocytosis   Chronic wound   AMS (altered mental status)  Plan: Altered mental status, improving Leukocytosis, resolving Lactic acidosis, resolved Elevated Creatinine, resolved Hemodynamically stable remains afebrile.  No growth on blood cultures.  Awaiting UA to be collected but has been difficult to obtain due to his baseline agitation.  Patient documented void this morning, will assess for any concerns of urinary retention.  His mental status appears to be near his baseline.  Leukocytosis improving to 10.8 today.  Awaiting UA and blood culture results, likely can be discharged to [facility] soon. -Pending UA  -Continue ceftriaxone (day 3) -Follow-up blood culture  results -Delirium precautions -Trend fever and CBC  Rectal wound Patient has a rectal wound with subcutaneous tissue visible but no exposed bone or tendon. Not thought to be a pressure injury. He has some granulation tissue and scarring present, and the sphincter tone seems absent, as if he had a prior rectal tube. Spoke with [facility] and they stated he has not had rectal tube there.  Prior chart review shows he had history of large prolapsing hemorrhoid in 2019 but unclear if any procedures completed for that.  Wound care evaluated him and suggested surgery or GI but hesitant due to his poor functional baseline status.  Will continue to manage supportively during hospitalization.   Chronic conditions: Glaucoma- continued Latanoprost 0.005%, instill 1 drop in left eye at bedtime; Brimonidine tartrate 0.2%, 1 drop in left eye BID for glaucoma; Pilocarpine HCl 1%, 1 drop into both eyes QID for glaucoma; Dorzolamide-Timolol 22.3-6.8 mg 1 drop in left eye BID for glaucoma. Depression/anxiety- continued Mirtazapine 7.5 mg nightly Dementia- continued Memantine 5 mg nightly Hx of CVA, hyperlipidemia- continued ASA 81 mg daily, atorvastatin 40 mg daily.  Can likely resume home Plavix 75 mg if no evidence of bleeding. Hypertension-continued clonidine patch weekly  Best Practice: Diet: Dys 3 IVF: Fluids: None VTE: enoxaparin (LOVENOX) injection 40 mg Start: 07/01/23 1615 Code: DNR/DNI AB: Ceftriaxone Family Contact: Granddaughter, [name], called and notified. DISPO: Anticipated discharge in 1 days to Skilled nursing facility pending medical/infectious workup and IV antibiotics.  Signature: Rana Snare, DO Internal Medicine Resident PGY-2 Pager: [phone] Please contact the on-call pager after 5 pm and on weekends at [phone].

## 2023-07-04 DIAGNOSIS — Z515 Encounter for palliative care: Secondary | ICD-10-CM

## 2023-07-04 DIAGNOSIS — L8990 Pressure ulcer of unspecified site, unspecified stage: Secondary | ICD-10-CM | POA: Insufficient documentation

## 2023-07-04 DIAGNOSIS — R4182 Altered mental status, unspecified: Secondary | ICD-10-CM | POA: Diagnosis not present

## 2023-07-04 LAB — CBC
HCT: 35.6 % — ABNORMAL LOW (ref 39.0–52.0)
Hemoglobin: 11.5 g/dL — ABNORMAL LOW (ref 13.0–17.0)
MCH: 25.4 pg — ABNORMAL LOW (ref 26.0–34.0)
MCHC: 32.3 g/dL (ref 30.0–36.0)
MCV: 78.6 fL — ABNORMAL LOW (ref 80.0–100.0)
Platelets: 309 10*3/uL (ref 150–400)
RBC: 4.53 MIL/uL (ref 4.22–5.81)
RDW: 14.4 % (ref 11.5–15.5)
WBC: 7.9 10*3/uL (ref 4.0–10.5)
nRBC: 0 % (ref 0.0–0.2)

## 2023-07-04 NOTE — Progress Notes (Addendum)
HD#2 SUBJECTIVE:  Patient Summary: Jorge Burns is a 82 y.o. 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   Overnight Events: No acute events overnight  Interim History:  Patient evaluated at bedside. Sleeping comfortably but arousable and moving left extremities. Remains nonverbal.   OBJECTIVE:  Vital Signs: Vitals:   07/02/23 1608 07/02/23 2024 07/03/23 0440 07/04/23 0421  BP: (!) 160/94 (!) 157/90  123/83  Pulse: 95 (!) 58 97 73  Resp: 18 16  18   Temp: 97.8 F (36.6 C) 99.5 F (37.5 C) (!) 97.4 F (36.3 C) 98 F (36.7 C)  TempSrc: Oral Axillary Rectal Oral  SpO2: 98% 92%  100%  Weight:      Height:       Supplemental O2: Room Air SpO2: 100 %  Filed Weights   07/01/23 0913  Weight: 60 kg     Intake/Output Summary (Last 24 hours) at 07/04/2023 1610 Last data filed at 07/03/2023 1550 Gross per 24 hour  Intake 100 ml  Output --  Net 100 ml   Net IO Since Admission: -250 mL [07/04/23 0552]  Physical Exam: General: Cachectic appearing male laying in bed comfortably, in no acute distress Cardiac: Regular rate Pulmonary: Normal work of breathing on room air Skin: Warm and dry Neuro: Awake, remains nonverbal which is baseline, hemiplegic on right, no new focal deficits observed, unable to further assess due to limited participation by patient  Prior Exam:  Rectum: no sphincter tone appreciated, scarring and granulation tissue in rectal vault, minimal serosanguinous drainage  Patient Lines/Drains/Airways Status     Active Line/Drains/Airways     Name Placement date Placement time Site Days   Peripheral IV 09/17/22 20 G Right 09/17/22  0630  --  288   Pressure Injury 07/01/23 Rectum Stage 3 -  Full thickness tissue loss.  Subcutaneous fat may be visible but bone, tendon or muscle are NOT exposed. 07/01/23  1841  -- 1            Pertinent Labs:    Latest Ref Rng & Units 07/03/2023    5:30 AM 07/02/2023    7:31 AM 07/01/2023    9:43 AM  CBC  WBC 4.0 - 10.5 K/uL 10.6  11.6  21.7   Hemoglobin 13.0 - 17.0 g/dL 96.0  45.4  09.8   Hematocrit 39.0 - 52.0 % 39.6  38.8  46.1   Platelets 150 - 400 K/uL 310  276  326        Latest Ref Rng & Units 07/02/2023    7:31 AM 07/01/2023    9:43 AM 09/17/2022    7:08 AM  CMP  Glucose 70 - 99 mg/dL 119  147  829   BUN 8 - 23 mg/dL 17  21  17    Creatinine 0.61 - 1.24 mg/dL 5.62  1.30  8.65   Sodium 135 - 145 mmol/L 145  144  144   Potassium 3.5 - 5.1 mmol/L 3.9  4.3  4.5   Chloride 98 - 111 mmol/L 111  108  110   CO2 22 - 32 mmol/L 27  24    Calcium 8.9 - 10.3 mg/dL 9.1  9.4    Total Protein 6.5 - 8.1 g/dL  8.1    Total Bilirubin 0.3 -  1.2 mg/dL  0.8    Alkaline Phos 38 - 126 U/L  105    AST 15 - 41 U/L  33    ALT 0 - 44 U/L  31      No results for input(s): "GLUCAP" in the last 72 hours.   Blood cultures: No growth X 3 days UA: No leukocytes, nitrites or bacteria noted  Pertinent Imaging: No results found.  ASSESSMENT/PLAN:  Assessment: Principal Problem:   Altered mental status Active Problems:   Leukocytosis   Chronic wound   AMS (altered mental status)  Plan:  End of Life Care / GOC Suspect he is nearing end of life. Doubt he can keep up with his nutritional requirements. At this point we have not identified a treatable infection or reversible cause for his decline. Will monitor strict I&O and calorie count. Discuss with guardian our recommendations to transition to comfort care tomorrow.   Altered mental status, improving Leukocytosis, resolved Lactic acidosis, resolved Elevated Creatinine, resolved Remains hemodynamically stable and afebrile. Bcx no growth still. UA without findings of UTI. Leukocytosis resolved.  Will DC empiric  antibiotic.  -DC ceftriaxone today -Follow-up blood culture results -Delirium precautions  Rectal wound, chronic PTA Patient has a rectal wound with subcutaneous tissue visible but no exposed bone or tendon. Not thought to be a pressure injury. He has some granulation tissue and scarring present, and the sphincter tone seems absent, as if he had a prior rectal tube. Spoke with [facility] and they stated he has not had rectal tube there.  Prior chart review shows he had history of large prolapsing hemorrhoid in 2019 but unclear if any procedures completed for that.  Wound care evaluated him and suggested surgery or GI but hesitant due to his poor functional baseline status.  Will continue to manage supportively during hospitalization.  Nutritional insufficiency  GOC Per chart review, patient follows with [facility] outpatient. DNR/DNI. Discussed with emergency contact [name] Hineman and confirmed. She states he has been in [facility] for about 5 years.  Exam shows patient is cachectic.  RN notes few bites of food and has refused medications and some care.  Given his history, presentation and continued decline, would discuss with his surrogate decision maker about pursuing hospice.  At this time we will attempt to calorie count to assess his nutritional status today.   Chronic conditions: Glaucoma- continued Latanoprost 0.005%, instill 1 drop in left eye at bedtime; Brimonidine tartrate 0.2%, 1 drop in left eye BID for glaucoma; Pilocarpine HCl 1%, 1 drop into both eyes QID for glaucoma; Dorzolamide-Timolol 22.3-6.8 mg 1 drop in left eye BID for glaucoma. Depression/anxiety: Continue mirtazapine 7.5 mg nightly Dementia: Continued memantine 5 mg nightly Hx of CVA and HLD: Continued ASA 81 mg and atorvastatin 40 mg daily HTN: Continue clonidine patch weekly   Best Practice: Diet: Dys 3 IVF: Fluids: None VTE: enoxaparin (LOVENOX) injection 40 mg Start: 07/01/23 1615 Code: DNR/DNI AB: None Family  Contact: Granddaughter, [name], called and notified. DISPO: Anticipated discharge in 1 days to  LTF vs Hospice  pending nutritional status.   Signature: Rana Snare, DO Internal Medicine Resident PGY-2 Pager: [phone] Please contact the on-call pager after 5 pm and on weekends at [phone].

## 2023-07-05 DIAGNOSIS — E8729 Other acidosis: Secondary | ICD-10-CM

## 2023-07-05 DIAGNOSIS — R4182 Altered mental status, unspecified: Secondary | ICD-10-CM | POA: Diagnosis not present

## 2023-07-05 DIAGNOSIS — D72829 Elevated white blood cell count, unspecified: Secondary | ICD-10-CM | POA: Diagnosis not present

## 2023-07-05 NOTE — TOC Transition Note (Signed)
Transition of Care [facility]) - CM/SW Discharge Note   Patient Details  Name: Jorge Burns MRN: [id number] Date of Birth: 1941/07/01  [facility]) CM/SW Contact:  Alysa Duca A [place], Theresia Majors Phone Number: 07/05/2023, 1:45 PM   Clinical Narrative:     Patient will [facility] to: [facility]  Anticipated DC date: 07/05/23  Family notified: [name] Stroot  Transport by: Sharin Mons      Per MD patient ready for DC to [facility]. RN, patient, patient's family, and facility notified of DC. Discharge Summary and FL2 sent to facility. RN to call report prior to discharge [phone]). DC packet on chart. Ambulance transport requested for patient.     CSW will sign off for now as social work intervention is no longer needed. Please consult [place] again if new needs arise.   Final next level of care: Skilled Nursing Facility Barriers to Discharge: Barriers Resolved   Patient Goals and CMS Choice CMS Medicare.gov Compare Post Acute Care list provided to:: Patient Represenative (must comment) (spoke with granddaughter, [name] Mohler) Choice offered to / list presented to : Adult Children  Discharge Placement                Patient [facility] bed at:  [facility]) Patient to be transferred to facility by: [name] Kasor Name of family member notified: [name] Kasor Patient and family notified of of transfer: 07/05/23  Discharge Plan and Services Additional resources added to the After Visit Summary for     Discharge Planning Services: CM Consult Post Acute Care Choice: Nursing Home                               Social Determinants of Health (SDOH) Interventions SDOH Screenings   Food Insecurity: Patient Unable To Answer (07/03/2023)  Housing: High Risk (07/03/2023)  Transportation Needs: Patient Unable To Answer (07/03/2023)  Utilities: Patient Unable To Answer (07/03/2023)  Tobacco Use: Low Risk  (07/01/2023)     Readmission Risk Interventions     No data to display

## 2023-07-05 NOTE — Plan of Care (Signed)

## 2023-07-05 NOTE — Hospital Course (Addendum)
Altered mental status Leukocytosis Lactic acidosis He presented to the emergency department with concerns of a fever, behavior change, and elevated blood pressure. He was found to have leukocytosis and elevated lactic acid. An infectious workup/sepsis rule out was completed, including procalcitonin, blood cultures, and urinalysis, which was negative. He remained afebrile throughout his hospital stay.  CXR was negative for any acute cardiopulmonary processes. CT scan of the head was negative for acute abnormalities.  He was given 48 hours of empiric antibiotics while the workup was completed.  A urinalysis was done, which demonstrated hematuria but no bacteriuria or pyuria.  With no identified source of infection and resolution of the patient's lab abnormalities, he was deemed stable for discharge and return to [facility].  Hematuria Frank blood was noted in the urethra on presentation to the emergency department, thought to be most likely from a prior traumatic catheterization, especially given the patient's resistance to physical examination and care provided by the nursing team.  Urinalysis demonstrated hematuria, but no signs of active infection.  No obvious injuries were noted externally in the genital region.  His hemoglobin has remained stable and his urine color has improved during his hospital stay.  We expect this to resolve on its own, but if it does not, consider further workup if desired by the surrogate decision-maker.   Rectal wound History of prolapsing hemorrhoid Patient has a rectal wound with subcutaneous tissue visible but no exposed bone or tendon.  Wound care team was consulted who thought it was not a pressure injury. He has some granulation tissue and scarring present, and the sphincter tone seems absent.  It is possible that it is related to his prior prolapsing hemorrhoid.  I am not able to tell whether he had any procedures done previously in that region. Wound care evaluated  him and suggested surgery or GI consultation, but given his poor functional baseline status, we felt there was little benefit in working it up at this time as it is unlikely a source of infection. Wound care instructions per our wound team were followed during his hospital stay and can be continued at [facility].    Nutritional insufficiency  Goals of Care Per chart review, patient follows with [facility] outpatient. DNR/DNI. Discussed with emergency contact [name] Haubner and confirmed. She states he has been in [facility] for about 5 years.  Exam shows patient is cachectic.  RN notes few bites of food and has refused medications and some care.  Given his history, presentation and continued decline, would discuss with his surrogate decision maker about further goals of care conversations.

## 2023-07-05 NOTE — Discharge Summary (Signed)
Name: Jorge Burns MRN: [id number] DOB: 1941-08-19 82 y.o. PCP: Patient, No Pcp Per  Date of Admission: 07/01/2023  8:45 AM Date of Discharge:  07/05/2023 Attending Physician: Dr. Antony Contras  DISCHARGE DIAGNOSIS:  Primary Problem: Altered mental status   Hospital Problems: Principal Problem:   Altered mental status Active Problems:   Leukocytosis   Chronic wound   AMS (altered mental status)   End of life care   Pressure injury of skin    DISCHARGE MEDICATIONS:   Allergies as of 07/05/2023   No Known Allergies      Medication List     TAKE these medications    acetaminophen 500 MG tablet Commonly known as: TYLENOL Take 1,000 mg by mouth 2 (two) times daily.   ASPIRIN 81 PO Take 81 mg by mouth daily.   atorvastatin 40 MG tablet Commonly known as: LIPITOR Take 40 mg by mouth at bedtime.   brimonidine 0.2 % ophthalmic solution Commonly known as: ALPHAGAN Place 1 drop into the left eye 2 (two) times daily.   cloNIDine 0.1 mg/24hr patch Commonly known as: CATAPRES - Dosed in mg/24 hr Place 0.1 mg onto the skin See admin instructions. Apply 1 patch to skin every 7 days on Tuesdays.   dorzolamidel-timolol 22.3-6.8 MG/ML Soln ophthalmic solution Commonly known as: COSOPT Place 1 drop into the left eye 2 (two) times daily.   ENSURE PO Take 237 mLs by mouth daily.   NUTRITIONAL SUPPLEMENTS PO Take 1 each by mouth 2 (two) times daily. Frozen nutritional treat   latanoprost 0.005 % ophthalmic solution Commonly known as: Xalatan Place 1 drop into the left eye at bedtime.   magnesium hydroxide 400 MG/5ML suspension Commonly known as: MILK OF MAGNESIA Take 30 mLs by mouth every evening.   memantine 5 MG tablet Commonly known as: NAMENDA Take 5 mg by mouth at bedtime.   mirtazapine 7.5 MG tablet Commonly known as: REMERON Take 7.5 mg by mouth at bedtime.   pilocarpine 1 % ophthalmic solution Commonly known as: PILOCAR Place 1 drop into the left eye 4  (four) times daily. What changed: how to take this   Vitamin D3 125 MCG (5000 UT) Caps Take 5,000 Units by mouth daily.               Discharge Care Instructions  (From admission, onward)           Start     Ordered   07/05/23 0000  Discharge wound care:       Comments: Clean wound around rectum with NS, apply saline moistened gauze to wound bed twice daily, cover with dry gauze and silicone foam or ABD pad whichever is preferred. Wound care twice daily.   07/05/23 1052            DISPOSITION AND FOLLOW-UP:  [name] was discharged from [facility] in [place] condition. At the hospital follow up visit please address:  Goals of care: recommend pursuing further goals of care discussions with [facility] and the patient's surrogate decision maker about when to pursue comfort care given his poor functional status and insufficient PO intake.   Hematuria: thought to be traumatic from prior catheterization. No evidence of UTI on UA. Hgb stable. Urine color improving. If bleeding persists, and pending further GOC discussions, consider further workup.   Rectal wound: continue wound care instructions as listed above.   Follow-up Appointments: Follow up with your primary care provider at [facility] and your palliative care provider  with [facility].    HOSPITAL COURSE:  Patient Summary: Altered mental status Leukocytosis Lactic acidosis He presented to the emergency department with concerns of a fever, behavior change, and elevated blood pressure. He was found to have leukocytosis and elevated lactic acid. An infectious workup/sepsis rule out was completed, including procalcitonin, blood cultures, and urinalysis, which was negative. He remained afebrile throughout his hospital stay.  CXR was negative for any acute cardiopulmonary processes. CT scan of the head was negative for acute abnormalities.  He was given 48 hours of empiric antibiotics while the workup  was completed.  A urinalysis was done, which demonstrated hematuria but no bacteriuria or pyuria.  With no identified source of infection and resolution of the patient's lab abnormalities, he was deemed stable for discharge and return to [facility].  Hematuria Frank blood was noted in the urethra on presentation to the emergency department, thought to be most likely from a prior traumatic catheterization, especially given the patient's resistance to physical examination and care provided by the nursing team.  Urinalysis demonstrated hematuria, but no signs of active infection.  No obvious injuries were noted externally in the genital region.  His hemoglobin has remained stable and his urine color has improved during his hospital stay.  We expect this to resolve on its own, but if it does not, consider further workup if desired by the surrogate decision-maker.   Rectal wound History of prolapsing hemorrhoid Patient has a rectal wound with subcutaneous tissue visible but no exposed bone or tendon.  Wound care team was consulted who thought it was not a pressure injury. He has some granulation tissue and scarring present, and the sphincter tone seems absent.  It is possible that it is related to his prior prolapsing hemorrhoid.  I am not able to tell whether he had any procedures done previously in that region. Wound care evaluated him and suggested surgery or GI consultation, but given his poor functional baseline status, we felt there was little benefit in working it up at this time as it is unlikely a source of infection. Wound care instructions per our wound team were followed during his hospital stay and can be continued at [facility].    Nutritional insufficiency  Goals of Care Per chart review, patient follows with [facility] outpatient. DNR/DNI. Discussed with emergency contact [name] Bertagnolli and confirmed. She states he has been in [facility] for about 5 years.  Exam shows patient is cachectic.  RN  notes few bites of food and has refused medications and some care.  Given his history, presentation and continued decline, would discuss with his surrogate decision maker about further goals of care conversations.     DISCHARGE INSTRUCTIONS:   Discharge Instructions     Call MD for:  extreme fatigue   Complete by: As directed    Call MD for:  persistant dizziness or light-headedness   Complete by: As directed    Call MD for:  redness, tenderness, or signs of infection (pain, swelling, redness, odor or green/yellow discharge around incision site)   Complete by: As directed    Call MD for:  temperature >100.4   Complete by: As directed    Diet general   Complete by: As directed    Discharge instructions   Complete by: As directed    You were hospitalized for sepsis rule out. Thank you for allowing [place] to be part of your care. Your infectious workup was negative, so you are stable for discharge and return  to [facility].   Please continue to take all your medications as prescribed. No new medications were prescribed for discharge.   We recommend further goals of care discussions with the palliative care team who you have been working with to determine when to pursue comfort care.   Discharge wound care:   Complete by: As directed    Clean wound around rectum with NS, apply saline moistened gauze to wound bed twice daily, cover with dry gauze and silicone foam or ABD pad whichever is preferred. Wound care twice daily.   Increase activity slowly   Complete by: As directed        SUBJECTIVE:   Patient was evaluated at bedside.  He is unchanged from yesterday, non-verbal, eyes closed but reacts to our examination with resistance.  Eating 30-50% of meals yesterday. Back to baseline as far as we can tell.   Discharge Vitals:   BP 134/76 (BP Location: Left Arm)   Pulse 81   Temp 97.6 F (36.4 C) (Oral)   Resp 18   Ht 5\' 6"  (1.676 m)   Wt 60 kg   SpO2 95%   BMI 21.35 kg/m    OBJECTIVE:  Physical Exam Constitutional:      Appearance: He is ill-appearing.  HENT:     Head: Normocephalic and atraumatic.     Mouth/Throat:     Mouth: Mucous membranes are moist.  Eyes:     Extraocular Movements: Extraocular movements intact.     Conjunctiva/sclera: Conjunctivae normal.     Pupils: Pupils are equal, round, and reactive to light.  Cardiovascular:     Rate and Rhythm: Normal rate and regular rhythm.     Pulses: Normal pulses.     Heart sounds: Normal heart sounds.  Pulmonary:     Effort: Pulmonary effort is normal.     Breath sounds: Normal breath sounds.  Abdominal:     General: Abdomen is flat.     Palpations: Abdomen is soft.  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     Findings: No bruising or lesion.  Neurological:     Mental Status: Mental status is at baseline.     Pertinent Labs, Studies, and Procedures:     Latest Ref Rng & Units 07/04/2023    5:39 AM 07/03/2023    5:30 AM 07/02/2023    7:31 AM  CBC  WBC 4.0 - 10.5 K/uL 7.9  10.6  11.6   Hemoglobin 13.0 - 17.0 g/dL 09.8  11.9  14.7   Hematocrit 39.0 - 52.0 % 35.6  39.6  38.8   Platelets 150 - 400 K/uL 309  310  276        Latest Ref Rng & Units 07/02/2023    7:31 AM 07/01/2023    9:43 AM 09/17/2022    7:08 AM  CMP  Glucose 70 - 99 mg/dL 829  562  130   BUN 8 - 23 mg/dL 17  21  17    Creatinine 0.61 - 1.24 mg/dL 8.65  7.84  6.96   Sodium 135 - 145 mmol/L 145  144  144   Potassium 3.5 - 5.1 mmol/L 3.9  4.3  4.5   Chloride 98 - 111 mmol/L 111  108  110   CO2 22 - 32 mmol/L 27  24    Calcium  8.9 - 10.3 mg/dL 9.1  9.4    Total Protein 6.5 - 8.1 g/dL  8.1    Total Bilirubin 0.3 - 1.2 mg/dL  0.8    Alkaline Phos 38 - 126 U/L  105    AST 15 - 41 U/L  33    ALT 0 - 44 U/L  31     Blood cultures: No  growth Procalcitonin: <0.10  Urinalysis: Hgb large, RBC >50, no WBC, no bacteria Lactic Acid: 2.1 --> 3.7 --> 0.8   Imaging:  CT Head Wo Contrast Result Date: 07/01/2023 IMPRESSION: 1. Motion artifact, despite repeat acquisitions. 2. 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 Right frontal encephalomalacia has progressed from prior. Right temporal encephalomalacia is grossly stable. 3. No evidence of acute intracranial abnormality allowing for motion artifact. Electronically Signed   By: Narda Rutherford M.D.   On: 07/01/2023 15:30   DG Chest Portable 1 View Result Date: 07/01/2023 IMPRESSION: 1. Similar left lung scarring and right basilar linear opacities, likely atelectasis. 2. 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 Electronically Signed   By: Agustin Cree M.D.   On: 07/01/2023 11:29     Signed: Annett Fabian, MD Internal Medicine Resident, PGY-1 [facility]  Pager: [phone] 11:09 AM, 07/05/2023

## 2023-07-05 NOTE — Plan of Care (Signed)

## 2023-07-05 NOTE — Care Management Important Message (Signed)
Important Message  Patient Details  Name: Jorge Burns MRN: [id number] Date of Birth: 15-Mar-1941   Important Message Given:  Yes - Medicare IM     Dorena Bodo 07/05/2023, 2:56 PM

## 2023-07-06 LAB — CULTURE, BLOOD (ROUTINE X 2)
Culture: NO GROWTH
Culture: NO GROWTH

## 2023-07-09 ENCOUNTER — Emergency Department (HOSPITAL_COMMUNITY)
Admission: EM | Admit: 2023-07-09 | Discharge: 2023-07-09 | Disposition: A | Discharge status: Home / Self Care | Payer: Medicare Other | Attending: Emergency Medicine | Admitting: Emergency Medicine

## 2023-07-09 ENCOUNTER — Other Ambulatory Visit: Payer: Self-pay

## 2023-07-09 ENCOUNTER — Emergency Department (HOSPITAL_COMMUNITY): Payer: Medicare Other

## 2023-07-09 DIAGNOSIS — Z8673 Personal history of transient ischemic attack (TIA), and cerebral infarction without residual deficits: Secondary | ICD-10-CM | POA: Diagnosis not present

## 2023-07-09 DIAGNOSIS — W06XXXA Fall from bed, initial encounter: Secondary | ICD-10-CM | POA: Insufficient documentation

## 2023-07-09 DIAGNOSIS — R410 Disorientation, unspecified: Secondary | ICD-10-CM | POA: Insufficient documentation

## 2023-07-09 DIAGNOSIS — W19XXXA Unspecified fall, initial encounter: Secondary | ICD-10-CM

## 2023-07-09 DIAGNOSIS — F0390 Unspecified dementia without behavioral disturbance: Secondary | ICD-10-CM | POA: Insufficient documentation

## 2023-07-09 DIAGNOSIS — Z7982 Long term (current) use of aspirin: Secondary | ICD-10-CM | POA: Insufficient documentation

## 2023-07-09 DIAGNOSIS — S0990XA Unspecified injury of head, initial encounter: Secondary | ICD-10-CM | POA: Insufficient documentation

## 2023-07-09 DIAGNOSIS — Z79899 Other long term (current) drug therapy: Secondary | ICD-10-CM | POA: Insufficient documentation

## 2023-07-09 DIAGNOSIS — R451 Restlessness and agitation: Secondary | ICD-10-CM | POA: Insufficient documentation

## 2023-07-09 MED ORDER — OLANZAPINE 5 MG PO TBDP
5.0000 mg | ORAL_TABLET | Freq: Once | ORAL | Status: DC
  Filled 2023-07-09: qty 1

## 2023-07-09 NOTE — ED Notes (Signed)
Got patient on the blood pressure and the pulse oxy patient is resting with call bell in reach

## 2023-07-09 NOTE — ED Notes (Signed)
Patient transported to CT 

## 2023-07-09 NOTE — ED Provider Notes (Signed)
  Physical Exam  BP (!) 151/77   Pulse 85   Temp 98.1 F (36.7 C)   Resp 18   Ht 5\' 6"  (1.676 m)   Wt 60 kg   SpO2 97%   BMI 21.35 kg/m   Physical Exam Constitutional:      General: He is not in acute distress.    Comments: Lying on right side  Neurological:     Mental Status: Mental status is at baseline.      Procedures  Procedures  ED Course / MDM   Clinical Course as of 07/09/23 1524  Fri Jul 09, 2023  1521 From nursing home. Hx stroke. Non-communicative at baseline. Fall at facility. No traumatic findings. F/u head/cervical CT. If negative, discharge [KM]    Clinical Course User Index [KM] Lyman Speller, MD   Medical Decision Making Amount and/or Complexity of Data Reviewed Radiology: ordered.  Risk Prescription drug management.   Signout taken from off going ED team.  In brief, this is an 82 year old male with past medical history of CVA, hypertension, hyperlipidemia, dementia who is noncommunicative at baseline presenting for evaluation of fall from nursing facility.  Unwitnessed fall.  No obvious traumatic findings seen on physical examination.  Plan at time of handoff is to follow-up on CTs of patient's head and cervical spine.  If unremarkable, anticipate discharge home.  This plan was discussed with granddaughter, [name], who is agreeable.  Patient CT images were reviewed.  No acute intracranial abnormality.  He does have extensive encephalomalacia changes, unchanged from prior imaging.  There is also no acute fracture or malalignment within the cervical spine.  Given unremarkable imaging and patient at his reported baseline mental status, feel that discharge home is appropriate at this time.  Strict return precautions provided.   Lyman Speller, MD 07/09/23 Garnette Scheuermann    Gwyneth Sprout, MD 07/09/23 2329

## 2023-07-09 NOTE — ED Triage Notes (Signed)
Pt BIB [facility] for fall from lying in bed to to floor.  EMS reports facility states pt was lying in bed eating & when they returned he was in floor with his head at the opposite end.  EMS reports pt is combative.  Pt is bed bound & nonverbal.  No obvious injuries observed. Not on thinners.  BP 124/64 HR 88

## 2023-07-09 NOTE — ED Notes (Signed)
Ptar called 4th on the list eta 2hrs or more

## 2023-07-09 NOTE — Discharge Instructions (Addendum)
Jorge Burns:  Thank you for allowing [place] to take care of you today.  We hope you begin feeling better soon. You were seen today for a fall.  There are no acute findings within your head or cervical spine, which is good news  To-Do: Please follow-up with your primary doctor to schedule an appointment with a new primary care doctor within the next 2-3 days.  Please return to the Emergency Department or call 911 if you experience worsening falls, chest pain, shortness of breath, severe pain, severe fever, altered mental status, or have any reason to think that you need emergency medical care.  Thank you again.  Hope you feel better soon.

## 2023-07-09 NOTE — ED Provider Notes (Signed)
[employer] EMERGENCY DEPARTMENT AT [facility] Provider Note   CSN: [id number] Arrival date & time: 07/09/23  1324     History  Chief Complaint  Patient presents with   Fall    Jorge Burns is a 82 y.o. male.  [place] patient with history of stroke with right sided weakness, dementia --presents to the emergency department today after a fall.  Per EMS report, patient was in bed eating and then was found a few minutes later by staff on the floor.  I spoke with the patient's granddaughter [name], who spoke with the facility.  They state that the patient pointed to his head and said pain after the fall.  They discussed by telephone and decided to send him to the emergency department.  Patient is awake and alert.  He is not conversant even in his native tongue.  He does get agitated and hit and pushed away at baseline when examined.       Home Medications Prior to Admission medications   Medication Sig Start Date End Date Taking? Authorizing Provider  acetaminophen (TYLENOL) 500 MG tablet Take 1,000 mg by mouth 2 (two) times daily.    [provider]  ASPIRIN 81 PO Take 81 mg by mouth daily.    [provider]  atorvastatin (LIPITOR) 40 MG tablet Take 40 mg by mouth at bedtime.    [provider]  brimonidine (ALPHAGAN) 0.2 % ophthalmic solution Place 1 drop into the left eye 2 (two) times daily. 12/29/18   Fawze, Mina A, PA-C  Cholecalciferol (VITAMIN D3) 125 MCG (5000 UT) CAPS Take 5,000 Units by mouth daily.    [provider]  cloNIDine (CATAPRES - DOSED IN MG/24 HR) 0.1 mg/24hr patch Place 0.1 mg onto the skin See admin instructions. Apply 1 patch to skin every 7 days on Tuesdays.    [provider]  dorzolamidel-timolol (COSOPT) 22.3-6.8 MG/ML SOLN ophthalmic solution Place 1 drop into the left eye 2 (two) times daily. 12/29/18   Fawze, Mina A, PA-C  latanoprost (XALATAN) 0.005 % ophthalmic solution Place 1 drop into the  left eye at bedtime. 08/08/18   Eber Hong, MD  magnesium hydroxide (MILK OF MAGNESIA) 400 MG/5ML suspension Take 30 mLs by mouth every evening.    [provider]  memantine (NAMENDA) 5 MG tablet Take 5 mg by mouth at bedtime.    [provider]  mirtazapine (REMERON) 7.5 MG tablet Take 7.5 mg by mouth at bedtime.    [provider]  Nutritional Supplements (ENSURE PO) Take 237 mLs by mouth daily.    [provider]  NUTRITIONAL SUPPLEMENTS PO Take 1 each by mouth 2 (two) times daily. Frozen nutritional treat    [provider]  pilocarpine (PILOCAR) 1 % ophthalmic solution Place 1 drop into the left eye 4 (four) times daily. Patient taking differently: Place 1 drop into both eyes 4 (four) times daily. 12/29/18   Michela Pitcher A, PA-C      Allergies    Patient has no known allergies.    Review of Systems   Review of Systems  Physical Exam Updated Vital Signs BP (!) 151/77   Pulse 85   Temp 98.1 F (36.7 C)   Resp 18   Ht 5\' 6"  (1.676 m)   Wt 60 kg   SpO2 97%   BMI 21.35 kg/m   Physical Exam Vitals and nursing note reviewed.  Constitutional:      Appearance: He is well-developed.  HENT:     Head: Normocephalic and atraumatic. No raccoon eyes or Battle's sign.     Right Ear: External ear normal.     Left Ear: External ear normal.     Nose: Nose normal.     Mouth/Throat:     Mouth: Mucous membranes are moist.     Pharynx: Oropharynx is clear.  Eyes:     General: Lids are normal.     Conjunctiva/sclera: Conjunctivae normal.     Pupils: Pupils are equal, round, and reactive to light.     Comments: No visible hyphema  Cardiovascular:     Rate and Rhythm: Normal rate and regular rhythm.     Comments: I was able to listen to heart/lungs briefly before he grabbed stethoscope.  Pulmonary:     Effort: Pulmonary effort is normal. No respiratory distress.     Breath sounds: Normal breath sounds.  Abdominal:     Palpations: Abdomen  is soft.     Tenderness: There is no abdominal tenderness. There is no guarding or rebound.  Musculoskeletal:        General: Normal range of motion.     Cervical back: Normal range of motion and neck supple. No tenderness or bony tenderness.     Thoracic back: No tenderness or bony tenderness.     Lumbar back: No tenderness or bony tenderness.  Skin:    General: Skin is warm and dry.  Neurological:     Mental Status: He is alert. He is disoriented.     GCS: GCS eye subscore is 4. GCS verbal subscore is 5. GCS motor subscore is 6.     Comments: Non-verbal, combative with anything more than simple exam.      ED Results / Procedures / Treatments   Labs (all labs ordered are listed, but only abnormal results are displayed) Labs Reviewed - No data to display  EKG None  Radiology No results found.  Procedures Procedures    Medications Ordered in ED Medications  OLANZapine zydis (ZYPREXA) disintegrating tablet 5 mg (5 mg Oral Patient Refused/Not Given 07/09/23 1442)    ED Course/ Medical Decision Making/ A&P Clinical Course as of 07/09/23 1535  Fri Jul 09, 2023  1521 From nursing home. Hx stroke. Non-communicative at baseline. Fall at facility. No traumatic findings. F/u head/cervical CT. If negative, discharge [KM]    Clinical Course User Index [KM] Lyman Speller, MD    Patient seen and examined. History obtained from records, EMS report, discussion with family.   Labs/EKG: None ordered.   Imaging: None ordered.   Medications/Fluids: ODT zyprexa for mild sedation -- pt refused.   Most recent vital signs reviewed and are as follows: BP (!) 151/77   Pulse 85   Temp 98.1 F (36.7 C)   Resp 18   Ht 5\' 6"  (1.676 m)   Wt 60 kg   SpO2 97%   BMI 21.35 kg/m   Initial impression: Likely mild head injury, appears to be baseline.  I discussed how much of a workup to do with the patient's granddaughter by telephone.  We decided to obtain head CT and cervical spine CT  given possible pain after fall.  Agreed to defer blood testing or other workup at this time.  3:35 PM CT results pending.  Discussed case with Dr. Vanessa Kick. Ledon Snare at shift change. If imaging unremarkable, anticipate discharge back to facility.  Patient appears to be at his current mental status baseline at this time based on exam and  per family report.                                Medical Decision Making Amount and/or Complexity of Data Reviewed Radiology: ordered.  Risk Prescription drug management.   Minor head injury. Suspect at baseline.         Final Clinical Impression(s) / ED Diagnoses Final diagnoses:  Minor head injury, initial encounter    Rx / DC Orders ED Discharge Orders     None         Renne Crigler, PA-C 07/09/23 1536    Sloan Leiter, DO 07/11/23 [phone]

## 2023-07-09 NOTE — ED Triage Notes (Signed)
Pt is biting, spitting & swinging at staff when any attempts are made to obtain vital signs.

## 2023-09-27 ENCOUNTER — Encounter (HOSPITAL_COMMUNITY): Payer: Self-pay | Admitting: Emergency Medicine

## 2023-09-27 ENCOUNTER — Other Ambulatory Visit: Payer: Self-pay

## 2023-09-27 ENCOUNTER — Emergency Department (HOSPITAL_COMMUNITY)

## 2023-09-27 ENCOUNTER — Emergency Department (HOSPITAL_COMMUNITY)
Admission: EM | Admit: 2023-09-27 | Discharge: 2023-09-27 | Disposition: A | Discharge status: Skilled Nursing Facility | Attending: Emergency Medicine | Admitting: Emergency Medicine

## 2023-09-27 DIAGNOSIS — R000 Tachycardia, unspecified: Secondary | ICD-10-CM | POA: Diagnosis not present

## 2023-09-27 DIAGNOSIS — R918 Other nonspecific abnormal finding of lung field: Secondary | ICD-10-CM | POA: Insufficient documentation

## 2023-09-27 DIAGNOSIS — Y92122 Bedroom in nursing home as the place of occurrence of the external cause: Secondary | ICD-10-CM | POA: Diagnosis not present

## 2023-09-27 DIAGNOSIS — E870 Hyperosmolality and hypernatremia: Secondary | ICD-10-CM | POA: Diagnosis not present

## 2023-09-27 DIAGNOSIS — W19XXXA Unspecified fall, initial encounter: Secondary | ICD-10-CM

## 2023-09-27 DIAGNOSIS — Z7982 Long term (current) use of aspirin: Secondary | ICD-10-CM | POA: Diagnosis not present

## 2023-09-27 DIAGNOSIS — Z043 Encounter for examination and observation following other accident: Secondary | ICD-10-CM | POA: Diagnosis present

## 2023-09-27 DIAGNOSIS — S0990XA Unspecified injury of head, initial encounter: Secondary | ICD-10-CM | POA: Diagnosis present

## 2023-09-27 DIAGNOSIS — W06XXXA Fall from bed, initial encounter: Secondary | ICD-10-CM | POA: Insufficient documentation

## 2023-09-27 LAB — CBC WITH DIFFERENTIAL/PLATELET
Abs Immature Granulocytes: 0.04 10*3/uL (ref 0.00–0.07)
Basophils Absolute: 0.1 10*3/uL (ref 0.0–0.1)
Basophils Relative: 1 %
Eosinophils Absolute: 0.1 10*3/uL (ref 0.0–0.5)
Eosinophils Relative: 1 %
HCT: 45.6 % (ref 39.0–52.0)
Hemoglobin: 14.4 g/dL (ref 13.0–17.0)
Immature Granulocytes: 0 %
Lymphocytes Relative: 25 %
Lymphs Abs: 2.5 10*3/uL (ref 0.7–4.0)
MCH: 25.8 pg — ABNORMAL LOW (ref 26.0–34.0)
MCHC: 31.6 g/dL (ref 30.0–36.0)
MCV: 81.6 fL (ref 80.0–100.0)
Monocytes Absolute: 0.8 10*3/uL (ref 0.1–1.0)
Monocytes Relative: 7 %
Neutro Abs: 6.9 10*3/uL (ref 1.7–7.7)
Neutrophils Relative %: 66 %
Platelets: 259 10*3/uL (ref 150–400)
RBC: 5.59 MIL/uL (ref 4.22–5.81)
RDW: 15.4 % (ref 11.5–15.5)
WBC: 10.3 10*3/uL (ref 4.0–10.5)
nRBC: 0 % (ref 0.0–0.2)

## 2023-09-27 LAB — URINALYSIS, ROUTINE W REFLEX MICROSCOPIC
Bacteria, UA: NONE SEEN
Bilirubin Urine: NEGATIVE
Glucose, UA: NEGATIVE mg/dL
Ketones, ur: NEGATIVE mg/dL
Nitrite: NEGATIVE
Protein, ur: 30 mg/dL — AB
Specific Gravity, Urine: 1.02 (ref 1.005–1.030)
pH: 5 (ref 5.0–8.0)

## 2023-09-27 LAB — BASIC METABOLIC PANEL
Anion gap: 9 (ref 5–15)
BUN: 23 mg/dL (ref 8–23)
CO2: 27 mmol/L (ref 22–32)
Calcium: 10.1 mg/dL (ref 8.9–10.3)
Chloride: 114 mmol/L — ABNORMAL HIGH (ref 98–111)
Creatinine, Ser: 1.28 mg/dL — ABNORMAL HIGH (ref 0.61–1.24)
GFR, Estimated: 56 mL/min — ABNORMAL LOW (ref >60)
Glucose, Bld: 126 mg/dL — ABNORMAL HIGH (ref 70–99)
Potassium: 4.5 mmol/L (ref 3.5–5.1)
Sodium: 150 mmol/L — ABNORMAL HIGH (ref 135–145)

## 2023-09-27 MED ORDER — LACTATED RINGERS IV BOLUS
500.0000 mL | Freq: Once | INTRAVENOUS | Status: AC
  Administered 2023-09-27: 500 mL via INTRAVENOUS

## 2023-09-27 MED ORDER — LORAZEPAM 2 MG/ML IJ SOLN
0.5000 mg | Freq: Once | INTRAMUSCULAR | Status: AC
  Administered 2023-09-27: 0.5 mg via INTRAVENOUS
  Filled 2023-09-27: qty 1

## 2023-09-27 NOTE — ED Notes (Signed)
 Urine collected via in and out done by esther RN, urine collected 5mL, pt was combative during urine retrieval with fighting, grabbing, and biting, all that came out was the 5mL for the sample.

## 2023-09-27 NOTE — Discharge Instructions (Signed)
 Your workup was reassuring.  CT scan of the head and neck did not show any concerning findings.  Your sodium level was slightly elevated at 150.  Upper end of normal is 145.  He received some fluids in the emergency department.  Your facility can repeat this tomorrow morning to ensure that this is downtrending and normalizes.  For any concerning symptoms return to the emergency room.

## 2023-09-27 NOTE — ED Notes (Signed)
 Patient transported to CT

## 2023-09-27 NOTE — ED Notes (Addendum)
 Granddaughter made aware of pt dispo. Discharge instructions reviewed with patient. Patient questions answered and opportunity for education reviewed. Attempted report to facility x 3 with no success.

## 2023-09-27 NOTE — ED Provider Notes (Signed)
 [place] EMERGENCY DEPARTMENT AT [facility] Provider Note   CSN: [id number] Arrival date & time: 09/27/23  9146     History  Chief Complaint  Patient presents with   Fall    Patient arrived from [facility], unwitnessed fall onto a floor mat, patient combative, speaks Vietnamese, confused at baseline, no bloodthinners     Jorge Burns is a 83 y.o. male.  83-year male presents from nursing home for concern of fall.  This was an unwitnessed fall.  I spoke to the nurse at the facility and she states that patient was found with the top half of his body hanging off of the bed.  I spoke to patient's granddaughter [name].  She states that patient is currently sitting at his baseline mentation wise.  She is given minimal information from the nursing home.  She was advised that he was being sent to the emergency department for a fall.  The history is provided by the nursing home and a relative.       Home Medications Prior to Admission medications   Medication Sig Start Date End Date Taking? Authorizing Provider  aspirin  EC 81 MG tablet Take 81 mg by mouth daily. Swallow whole.   Yes [provider]  atorvastatin  (LIPITOR ) 40 MG tablet Take 40 mg by mouth at bedtime.   Yes [provider]  haloperidol lactate (HALDOL) 5 MG/ML injection Inject 2 mg into the muscle in the morning, at noon, and at bedtime. 09/23/23  Yes [provider]  LORazepam  (ATIVAN ) 1 MG tablet Take 1 mg by mouth in the morning, at noon, and at bedtime.   Yes [provider]  pilocarpine  (PILOCAR) 1 % ophthalmic solution Place 1 drop into the left eye 4 (four) times daily. Patient taking differently: Place 1 drop into both eyes 4 (four) times daily. 12/29/18  Yes Fawze, Mina A, PA-C  acetaminophen  (TYLENOL ) 500 MG tablet Take 1,000 mg by mouth 2 (two) times daily.    [provider]  brimonidine  (ALPHAGAN ) 0.2 % ophthalmic solution Place 1 drop into the left eye 2 (two)  times daily. 12/29/18   Fawze, Mina A, PA-C  Cholecalciferol (VITAMIN D3) 125 MCG (5000 UT) CAPS Take 5,000 Units by mouth daily.    [provider]  cloNIDine  (CATAPRES  - DOSED IN MG/24 HR) 0.1 mg/24hr patch Place 0.1 mg onto the skin See admin instructions. Apply 1 patch to skin every 7 days on Tuesdays.    [provider]  dorzolamidel-timolol  (COSOPT ) 22.3-6.8 MG/ML SOLN ophthalmic solution Place 1 drop into the left eye 2 (two) times daily. 12/29/18   Fawze, Mina A, PA-C  latanoprost  (XALATAN ) 0.005 % ophthalmic solution Place 1 drop into the left eye at bedtime. 08/08/18   Cleotilde Rogue, MD  magnesium  hydroxide (MILK OF MAGNESIA) 400 MG/5ML suspension Take 30 mLs by mouth every evening.    [provider]  memantine  (NAMENDA ) 5 MG tablet Take 5 mg by mouth at bedtime.    [provider]  mirtazapine  (REMERON ) 7.5 MG tablet Take 7.5 mg by mouth at bedtime.    [provider]  Nutritional Supplements (ENSURE PO) Take 237 mLs by mouth daily.    [provider]  NUTRITIONAL SUPPLEMENTS PO Take 1 each by mouth 2 (two) times daily. Frozen nutritional treat    [provider]      Allergies    Patient has no known allergies.    Review of Systems   Review of  Systems  Unable to perform ROS: Dementia    Physical Exam Updated Vital Signs BP (!) 143/111   Pulse (!) 138   Temp (S) 98.7 F (37.1 C) (Rectal)   Resp 18   Ht 5' 6 (1.676 m)   Wt 54.4 kg   SpO2 94%   BMI 19.37 kg/m  Physical Exam Vitals and nursing note reviewed.  Constitutional:      General: He is not in acute distress.    Appearance: Normal appearance. He is not ill-appearing.  HENT:     Head: Normocephalic and atraumatic.     Nose: Nose normal.  Eyes:     Conjunctiva/sclera: Conjunctivae normal.  Cardiovascular:     Rate and Rhythm: Regular rhythm. Tachycardia present.  Pulmonary:     Effort: Pulmonary effort is normal. No respiratory distress.      Breath sounds: Normal breath sounds. No wheezing.  Abdominal:     General: There is no distension.     Palpations: Abdomen is soft.     Tenderness: There is no abdominal tenderness.  Musculoskeletal:        General: No tenderness or deformity. Normal range of motion.  Skin:    Findings: No rash.  Neurological:     Mental Status: He is alert.     ED Results / Procedures / Treatments   Labs (all labs ordered are listed, but only abnormal results are displayed) Labs Reviewed - No data to display  EKG None  Radiology No results found.  Procedures Procedures    Medications Ordered in ED Medications - No data to display  ED Course/ Medical Decision Making/ A&P Clinical Course as of 09/27/23 1431  Mon Sep 27, 2023  1150 Urinalysis, Routine w reflex microscopic -Urine, Clean Catch [AA]    Clinical Course User Index [AA] Hildegard Loge, PA-C                                 Medical Decision Making Amount and/or Complexity of Data Reviewed Labs: ordered. Decision-making details documented in ED Course. Radiology: ordered.   83 year old male presents today for concern of an unwitnessed fall.  Patient was found hanging off of the bed with upper half of the body towards the ground. Patient was also found to be tachycardic on arrival to the emergency department.  Previous ED visits noted to have normal rate.  According to nursing home he was with normal rate yesterday. Will obtain basic infectious workup as well.  CT head and cervical spine without acute intracranial or cervical spinal finding. CBC without leukocytosis or anemia.  BMP shows mild hypernatremia at 150, and mild renal insufficiency otherwise without acute concern.  Chest x-ray shows potential atelectasis or pneumonia.  He is afebrile, and does not have a cough.  Low suspicion for pneumonia.  Discussed with attending.  Also confirmed goals of care with floor care.  Confirmed with granddaughter.  Everyone is in  agreement with plan.  Will give an additional half liter fluid to complete the full liter.  We can have nursing home follow-up on the labs tomorrow. Granddaughter was in agreement.  Will sign out to oncoming team follow-up on UA.   Final Clinical Impression(s) / ED Diagnoses Final diagnoses:  Fall, initial encounter  Hypernatremia    Rx / DC Orders ED Discharge Orders     None         Hildegard Loge, PA-C 09/27/23 1508  Patsey Lot, MD 09/27/23 [phone]

## 2023-09-27 NOTE — ED Provider Notes (Signed)
 Patient's care assumed by me at 3:00 PM.  Patient is pending UA for evaluation of possible UTI.  Patient is currently on hospice care. UA no evidence of urinary tract infection. Patient has an elevated sodium.  This provider had contacted facility and they will follow-up with repeat labs.  Did have some agitation while he was here requiring some sedation patient was given Ativan  0.5 mg.  Patient reassessed at 7:30 PM.  Patient is stable to return to hospice care.   Jorge Burns Jorge Burns 09/27/23 1937    [name], MD 10/01/23 2046

## 2023-09-27 NOTE — Progress Notes (Signed)
 [facility] ED [facility]       This patient is a current hospice patient with [facility], admitted 12.19.24 with a terminal diagnosis of cerebrovascular disease.   [facility] will continue to follow for any discharge planning needs and to coordinate continuation of hospice care.    Please don't hesitate to call with any Hospice related questions or concerns.    Eleanor Nail, LPN [facility] Liaison [phone]

## 2023-09-27 NOTE — ED Notes (Signed)
Bed alarm placed under patient. 

## 2023-09-27 NOTE — ED Notes (Signed)
[facility] called, 2nd on list
